# Patient Record
Sex: Female | Born: 1935 | Race: White | Hispanic: No | Marital: Married | Smoking: Former smoker
Health system: Southern US, Community
[De-identification: ages and names within clinical notes are randomized; demographics above are authoritative.]

## PROBLEM LIST (undated history)

## (undated) DIAGNOSIS — K219 Gastro-esophageal reflux disease without esophagitis: Secondary | ICD-10-CM

## (undated) DIAGNOSIS — E785 Hyperlipidemia, unspecified: Secondary | ICD-10-CM

## (undated) DIAGNOSIS — M1990 Unspecified osteoarthritis, unspecified site: Secondary | ICD-10-CM

## (undated) DIAGNOSIS — I209 Angina pectoris, unspecified: Secondary | ICD-10-CM

## (undated) DIAGNOSIS — T63441A Toxic effect of venom of bees, accidental (unintentional), initial encounter: Secondary | ICD-10-CM

## (undated) DIAGNOSIS — T783XXA Angioneurotic edema, initial encounter: Secondary | ICD-10-CM

## (undated) DIAGNOSIS — R0789 Other chest pain: Secondary | ICD-10-CM

## (undated) DIAGNOSIS — G43909 Migraine, unspecified, not intractable, without status migrainosus: Secondary | ICD-10-CM

## (undated) HISTORY — DX: Angioneurotic edema, initial encounter: T78.3XXA

## (undated) HISTORY — DX: Other chest pain: R07.89

## (undated) HISTORY — DX: Toxic effect of venom of bees, accidental (unintentional), initial encounter: T63.441A

## (undated) HISTORY — PX: HEMORRHOID SURGERY: SHX153

## (undated) HISTORY — PX: ADENOIDECTOMY: SUR15

## (undated) HISTORY — PX: TONSILLECTOMY: SUR1361

## (undated) HISTORY — PX: OTHER SURGICAL HISTORY: SHX169

## (undated) HISTORY — PX: BACK SURGERY: SHX140

---

## 1998-01-31 ENCOUNTER — Ambulatory Visit (HOSPITAL_COMMUNITY): Admission: RE | Admit: 1998-01-31 | Discharge: 1998-01-31 | Payer: Self-pay | Admitting: Family Medicine

## 1998-02-09 ENCOUNTER — Encounter: Admission: RE | Admit: 1998-02-09 | Discharge: 1998-05-10 | Payer: Self-pay | Admitting: Anesthesiology

## 1998-03-02 ENCOUNTER — Observation Stay (HOSPITAL_COMMUNITY): Admission: RE | Admit: 1998-03-02 | Discharge: 1998-03-03 | Payer: Self-pay | Admitting: Neurosurgery

## 1999-05-15 ENCOUNTER — Encounter: Admission: RE | Admit: 1999-05-15 | Discharge: 1999-05-15 | Payer: Self-pay | Admitting: Family Medicine

## 1999-07-10 ENCOUNTER — Encounter: Payer: Self-pay | Admitting: Otolaryngology

## 1999-07-10 ENCOUNTER — Encounter: Admission: RE | Admit: 1999-07-10 | Discharge: 1999-07-10 | Payer: Self-pay | Admitting: Otolaryngology

## 2000-06-05 ENCOUNTER — Encounter: Payer: Self-pay | Admitting: Family Medicine

## 2000-06-05 ENCOUNTER — Encounter: Admission: RE | Admit: 2000-06-05 | Discharge: 2000-06-05 | Payer: Self-pay | Admitting: Family Medicine

## 2000-09-23 ENCOUNTER — Emergency Department (HOSPITAL_COMMUNITY): Admission: EM | Admit: 2000-09-23 | Discharge: 2000-09-23 | Payer: Self-pay | Admitting: Emergency Medicine

## 2000-09-23 ENCOUNTER — Encounter: Payer: Self-pay | Admitting: Emergency Medicine

## 2001-03-11 ENCOUNTER — Ambulatory Visit (HOSPITAL_COMMUNITY): Admission: RE | Admit: 2001-03-11 | Discharge: 2001-03-11 | Payer: Self-pay | Admitting: Gastroenterology

## 2001-06-19 ENCOUNTER — Encounter: Payer: Self-pay | Admitting: Family Medicine

## 2001-06-19 ENCOUNTER — Encounter: Admission: RE | Admit: 2001-06-19 | Discharge: 2001-06-19 | Payer: Self-pay | Admitting: Family Medicine

## 2001-07-16 ENCOUNTER — Encounter: Admission: RE | Admit: 2001-07-16 | Discharge: 2001-07-16 | Payer: Self-pay | Admitting: Family Medicine

## 2001-07-16 ENCOUNTER — Encounter: Payer: Self-pay | Admitting: Family Medicine

## 2002-12-06 ENCOUNTER — Encounter: Admission: RE | Admit: 2002-12-06 | Discharge: 2002-12-06 | Payer: Self-pay | Admitting: Internal Medicine

## 2002-12-06 ENCOUNTER — Encounter: Payer: Self-pay | Admitting: Internal Medicine

## 2003-07-25 ENCOUNTER — Other Ambulatory Visit: Admission: RE | Admit: 2003-07-25 | Discharge: 2003-07-25 | Payer: Self-pay | Admitting: Obstetrics and Gynecology

## 2004-07-31 ENCOUNTER — Other Ambulatory Visit: Admission: RE | Admit: 2004-07-31 | Discharge: 2004-07-31 | Payer: Self-pay | Admitting: Obstetrics and Gynecology

## 2005-05-03 ENCOUNTER — Ambulatory Visit (HOSPITAL_COMMUNITY): Admission: RE | Admit: 2005-05-03 | Discharge: 2005-05-03 | Payer: Self-pay | Admitting: Neurosurgery

## 2005-08-07 ENCOUNTER — Other Ambulatory Visit: Admission: RE | Admit: 2005-08-07 | Discharge: 2005-08-07 | Payer: Self-pay | Admitting: Obstetrics and Gynecology

## 2006-09-11 ENCOUNTER — Other Ambulatory Visit: Admission: RE | Admit: 2006-09-11 | Discharge: 2006-09-11 | Payer: Self-pay | Admitting: Obstetrics and Gynecology

## 2006-09-23 ENCOUNTER — Encounter: Admission: RE | Admit: 2006-09-23 | Discharge: 2006-09-23 | Payer: Self-pay | Admitting: Neurosurgery

## 2007-09-16 ENCOUNTER — Other Ambulatory Visit: Admission: RE | Admit: 2007-09-16 | Discharge: 2007-09-16 | Payer: Self-pay | Admitting: Obstetrics and Gynecology

## 2008-09-19 ENCOUNTER — Other Ambulatory Visit: Admission: RE | Admit: 2008-09-19 | Discharge: 2008-09-19 | Payer: Self-pay | Admitting: Obstetrics and Gynecology

## 2009-01-14 ENCOUNTER — Encounter: Admission: RE | Admit: 2009-01-14 | Discharge: 2009-01-14 | Payer: Self-pay | Admitting: Neurosurgery

## 2009-06-08 ENCOUNTER — Ambulatory Visit: Payer: Self-pay | Admitting: Cardiovascular Disease

## 2009-06-08 ENCOUNTER — Encounter (INDEPENDENT_AMBULATORY_CARE_PROVIDER_SITE_OTHER): Payer: Self-pay | Admitting: Internal Medicine

## 2009-06-08 ENCOUNTER — Observation Stay (HOSPITAL_COMMUNITY): Admission: EM | Admit: 2009-06-08 | Discharge: 2009-06-09 | Payer: Self-pay | Admitting: Emergency Medicine

## 2009-06-14 ENCOUNTER — Telehealth (INDEPENDENT_AMBULATORY_CARE_PROVIDER_SITE_OTHER): Payer: Self-pay | Admitting: *Deleted

## 2009-06-15 ENCOUNTER — Encounter (HOSPITAL_COMMUNITY): Admission: RE | Admit: 2009-06-15 | Discharge: 2009-08-01 | Payer: Self-pay | Admitting: Cardiovascular Disease

## 2009-06-15 ENCOUNTER — Ambulatory Visit: Payer: Self-pay | Admitting: Cardiovascular Disease

## 2009-06-15 ENCOUNTER — Ambulatory Visit: Payer: Self-pay

## 2009-06-30 DIAGNOSIS — E782 Mixed hyperlipidemia: Secondary | ICD-10-CM | POA: Insufficient documentation

## 2009-06-30 DIAGNOSIS — K219 Gastro-esophageal reflux disease without esophagitis: Secondary | ICD-10-CM | POA: Insufficient documentation

## 2009-06-30 DIAGNOSIS — R252 Cramp and spasm: Secondary | ICD-10-CM | POA: Insufficient documentation

## 2009-07-06 ENCOUNTER — Encounter: Payer: Self-pay | Admitting: Cardiovascular Disease

## 2009-07-06 ENCOUNTER — Ambulatory Visit (HOSPITAL_COMMUNITY): Admission: RE | Admit: 2009-07-06 | Discharge: 2009-07-06 | Payer: Self-pay | Admitting: Cardiovascular Disease

## 2009-07-06 ENCOUNTER — Ambulatory Visit: Payer: Self-pay | Admitting: Cardiovascular Disease

## 2009-07-06 ENCOUNTER — Ambulatory Visit: Payer: Self-pay

## 2009-07-06 ENCOUNTER — Ambulatory Visit: Payer: Self-pay | Admitting: Cardiology

## 2009-07-06 DIAGNOSIS — R0789 Other chest pain: Secondary | ICD-10-CM | POA: Insufficient documentation

## 2009-07-06 DIAGNOSIS — I7121 Aneurysm of the ascending aorta, without rupture: Secondary | ICD-10-CM | POA: Insufficient documentation

## 2009-07-06 DIAGNOSIS — I712 Thoracic aortic aneurysm, without rupture: Secondary | ICD-10-CM | POA: Insufficient documentation

## 2009-07-06 HISTORY — DX: Other chest pain: R07.89

## 2009-07-11 ENCOUNTER — Ambulatory Visit: Payer: Self-pay | Admitting: Cardiovascular Disease

## 2009-10-19 ENCOUNTER — Other Ambulatory Visit: Admission: RE | Admit: 2009-10-19 | Discharge: 2009-10-19 | Payer: Self-pay | Admitting: Obstetrics and Gynecology

## 2010-02-09 ENCOUNTER — Encounter: Admission: RE | Admit: 2010-02-09 | Discharge: 2010-02-09 | Payer: Self-pay | Admitting: Neurosurgery

## 2010-09-04 NOTE — Assessment & Plan Note (Signed)
Summary: eph/jml   Visit Type:  EPH Primary Provider:  Loraine Leriche Perrini  CC:  chest pain.  History of Present Illness: 75 yo WF with h/o GERD, hyperlipidemia with recent admission to [facility] with chest pain. Her pain was atypical. She ruled out for an MI with serial cardiac enzymes. Stress test and echo were arranged as an outpatient. She is here today for follow up. She has had continued pain that migrates to different parts of her left chest wall. There is no associated SOB or palpitations. She believes that this is related to her GERD. Her stress test and echo are outlined below.   Current Medications (verified): 1)  Nitrostat 0.4 Mg Subl (Nitroglycerin) .Marland Kitchen.. 1 Tablet Under Tongue At Onset of Chest Pain; You May Repeat Every 5 Minutes For Up To 3 Doses. 2)  Protonix 40 Mg Tbec (Pantoprazole Sodium) .Marland Kitchen.. 1 Tab Once Daily 3)  Aspirin 81 Mg Tbec (Aspirin) .... Take One Tablet By Mouth Daily 4)  Estrace 0.1 Mg/gm Crea (Estradiol) .... Use As Directed Every Week 5)  Furosemide 20 Mg Tabs (Furosemide) .Marland Kitchen.. 1 Tab Once Daily 6)  Lipitor 20 Mg Tabs (Atorvastatin Calcium) .Marland Kitchen.. 1 Tab Once Daily 7)  Potassium Chloride Cr 10 Meq Cr-Tabs (Potassium Chloride) .Marland Kitchen.. 1 Tab Once Daily 8)  Vitamin D 400 Unit Caps (Cholecalciferol) .Marland Kitchen.. 1 Cap Once Daily 9)  Calcium 600 Mg Tabs (Calcium) .Marland Kitchen.. 1 Tab Once Daily 10)  Vitamin B Complex  Caps (B Complex Vitamins) .Marland Kitchen.. 1 Cap Once Daily  Allergies: 1)  ! * Diflucan  Past History:  Past Medical History: Reviewed history from 06/30/2009 and no changes required. HYPERLIPIDEMIA (ICD-272.4) LEG CRAMPS (ICD-729.82) GERD (ICD-530.81)    Past Surgical History: Reviewed history from 06/30/2009 and no changes required. Status post back surgeries.  Hemorrhoid surgery. Tonsillectomy  Family History: Reviewed history from 06/30/2009 and no changes required.  Mother deceased, failure to thrive after hip surgery    with some complaints of angina towards the end of her life.  Her father   deceased from prostate cancer with a history of hypertension.  She has a   sister with hypertension.   Social History: Reviewed history from 06/30/2009 and no changes required.  She lives in [place] with her husband.  She is a   Warden/ranger for [employer]  She is married.  She   quit smoking 40 years ago.  Occasional wine.  No use of drugs.      Review of Systems       The patient complains of chest pain.  The patient denies fatigue, malaise, fever, weight gain/loss, vision loss, decreased hearing, hoarseness, palpitations, shortness of breath, prolonged cough, wheezing, sleep apnea, coughing up blood, abdominal pain, blood in stool, nausea, vomiting, diarrhea, heartburn, incontinence, blood in urine, muscle weakness, joint pain, leg swelling, rash, skin lesions, headache, fainting, dizziness, depression, anxiety, enlarged lymph nodes, easy bruising or bleeding, and environmental allergies.    Vital Signs:  Patient profile:   75 year old female Height:      65 inches Weight:      169 pounds BMI:     28.22 Pulse rate:   58 / minute Pulse rhythm:   regular BP sitting:   108 / 70  (left arm) Cuff size:   large  Vitals Entered By: Danielle Rankin, CMA (July 06, 2009 2:46 PM)  Physical Exam  General:  General: Well developed, well nourished, NAD HEENT: OP clear, mucus membranes moist SKIN: warm,  dry Neuro: No focal deficits Musculoskeletal: Muscle strength 5/5 all ext Psychiatric: Mood and affect normal Neck: No JVD, no carotid bruits, no thyromegaly, no lymphadenopathy. Lungs:Clear bilaterally, no wheezes, rhonci, crackles CV: RRR no murmurs, gallops rubs Abdomen: soft, NT, ND, BS present Extremities: No edema, pulses 2+.    EKG  Procedure date:  07/06/2009  Findings:      Sinus bradycardia, rate 58 bpm. Normal o/w.  Echocardiogram  Procedure date:  07/06/2009   Findings:      Left ventricle: The cavity size was normal. Wall thickness was       normal. Systolic function was normal. The estimated ejection       fraction was in the range of 60% to 65%. Wall motion was normal;       there were no regional wall motion abnormalities. Features are       consistent with a pseudonormal left ventricular filling pattern,       with concomitant abnormal relaxation and increased filling       pressure (grade 2 diastolic dysfunction).     - Aortic valve: Transvalvular velocity was within the normal range.       There was no stenosis.     - Aorta: Ascending aortic diameter: 43mm (S).     - Mitral valve: No regurgitation.     - Right ventricle: The cavity size was normal. Systolic function was       normal.     - Pulmonary arteries: PA systolic pressure 31-35 mmHg.     - Systemic veins: IVC measures 2.3 cm with normal respirophasic       variation suggesting RA pressure 6-10 mmHg.     Impressions:            - Normal LV size and systolic function, EF 60-65% with no wall       motion abnormalities. There was moderate diastolic dysfunction. No       significant valvular dysfunction. Normal RV size and systolic       function. No pulmonary edema. Ascending aortic aneurysm measuring       4.3 cm. Would consider further imaging (CTA or MRA) to visualized       the remainder of the thoracic aorta.  Nuclear ETT  Procedure date:  06/15/2009  Findings:      Exercise Capacity: Poor exercise capacity. BP Response: Normal blood pressure response. Clinical Symptoms: Chest Pain ECG Impression: No significant ST segment change suggestive of ischemia. Overall Impression: Normal stress nuclear study. Overall Impression Comments: Normal   Impression & Recommendations:  Problem # 1:  CHEST PAIN UNSPECIFIED (ICD-786.50)  No evidence of ischemia on stress test. She does have mild dilation of the aortic root. The echo results were not available during her visit today. Will plan on CT angiogram to define ascending aorta size.  No invasive workup for chest pain at this time. If her symptoms do not improve after further GI workup, will consider cardiac cath to exclude CAD.  Her updated medication list for this problem includes:    Nitrostat 0.4 Mg Subl (Nitroglycerin) .Marland Kitchen... 1 tablet under tongue at onset of chest pain; you may repeat every 5 minutes for up to 3 doses.    Aspirin 81 Mg Tbec (Aspirin) .Marland Kitchen... Take one tablet by mouth daily  Orders: EKG w/ Interpretation (93000)  Problem # 2:  THORACIC AORTIC ANEURYSM (ICD-441.2) See above. CTA chest to size aorta and exclude dissection.   Patient Instructions: 1)  Your physician  recommends that you schedule a follow-up appointment in: 6 months 2)  Your physician recommends that you continue on your current medications as directed. Please refer to the Current Medication list given to you today.

## 2010-09-04 NOTE — Progress Notes (Signed)
Summary: Nuclear Pre-procedure  Phone Note Outgoing Call Call back at [facility] Phone [phone]   Call placed by: Stanton Kidney, EMT-P,  June 14, 2009 2:32 PM Action Taken: Phone Call Completed Summary of Call: Reviewed information on Myoview Information Sheet (see scanned document for further details).  Spoke with Pt's husband.    Nuclear Med Background Indications for Stress Test: Evaluation for Ischemia, Post Hospital  Indications Comments: 06/09/09 CP: MI r/o    Symptoms: Chest Pain, Chest Tightness, Nausea    Nuclear Pre-Procedure Cardiac Risk Factors: History of Smoking, Lipids

## 2010-09-04 NOTE — Assessment & Plan Note (Signed)
Summary: Cardiology Nuclear Study  Nuclear Med Background Indications for Stress Test: Evaluation for Ischemia, Post Hospital  Indications Comments: 06/09/09 CP: MI r/o    Symptoms: Chest Pain, Chest Tightness, Nausea    Nuclear Pre-Procedure Cardiac Risk Factors: History of Smoking, Lipids Caffeine/Decaff Intake: none NPO After: 7:30 AM Lungs: clear IV 0.9% NS with Angio Cath: 18g     IV Site: (R) AC IV Started by: Stanton Kidney EMT-P Chest Size (in) 36     Cup Size C     Height (in): 65 Weight (lb): 166 BMI: 27.72  Nuclear Med Study 1 or 2 day study:  1 day     Stress Test Type:  Stress Reading MD:  Charlton Haws, MD     Referring MD:  C.McAlhany Resting Radionuclide:  Technetium 28m Tetrofosmin     Resting Radionuclide Dose:  10.3 mCi  Stress Radionuclide:  Technetium 27m Tetrofosmin     Stress Radionuclide Dose:  32. mCi   Stress Protocol Exercise Time (min):  4:01 min     Max HR:  144 bpm     Predicted Max HR:  149 bpm  Max Systolic BP: 160 mm Hg     Percent Max HR:  96.64 %     METS: 5.4 Rate Pressure Product:  78295    Stress Test Technologist:  Milana Na EMT-P     Nuclear Technologist:  Harlow Asa CNMT  Rest Procedure  Myocardial perfusion imaging was performed at rest 45 minutes following the intravenous administration of Myoview Technetium 87m Tetrofosmin.  Stress Procedure  The patient exercised for 4:01. The patient stopped due to fatigue and chest pain/squeezing.  There were no significant ST-T wave changes.  Myoview was injected at peak exercise and myocardial perfusion imaging was performed after a brief delay.  QPS Raw Data Images:  Normal; no motion artifact; normal heart/lung ratio. Stress Images:  NI: Uniform and normal uptake of tracer in all myocardial segments. Rest Images:  Normal homogeneous uptake in all areas of the myocardium. Subtraction (SDS):  Normal Transient Ischemic Dilatation:  1.07  (Normal <1.22)   Lung/Heart Ratio:  .38  (Normal <0.45)  Quantitative Gated Spect Images QGS EDV:  64 ml QGS ESV:  15 ml QGS EF:  77 % QGS cine images:  Normal  Findings Normal nuclear study      Overall Impression  Exercise Capacity: Poor exercise capacity. BP Response: Normal blood pressure response. Clinical Symptoms: Chest Pain ECG Impression: No significant ST segment change suggestive of ischemia. Overall Impression: Normal stress nuclear study. Overall Impression Comments: Normal     Appended Document: Cardiology Nuclear Study No ischemia. Can we call pt. cdm  Appended Document: Cardiology Nuclear Study pt. notified

## 2010-11-07 LAB — TROPONIN I: Troponin I: <0.01 ng/mL (ref 0.00–0.06)

## 2010-11-07 LAB — COMPREHENSIVE METABOLIC PANEL
ALT: 31 U/L (ref 0–35)
AST: 31 U/L (ref 0–37)
Albumin: 3.8 g/dL (ref 3.5–5.2)
Alkaline Phosphatase: 72 U/L (ref 39–117)
BUN: 15 mg/dL (ref 6–23)
CO2: 29 mEq/L (ref 19–32)
Calcium: 9.1 mg/dL (ref 8.4–10.5)
Chloride: 106 mEq/L (ref 96–112)
Creatinine, Ser: 0.89 mg/dL (ref 0.4–1.2)
GFR calc Af Amer: >60 mL/min (ref >60)
GFR calc non Af Amer: >60 mL/min (ref >60)
Glucose, Bld: 83 mg/dL (ref 70–99)
Potassium: 3.9 mEq/L (ref 3.5–5.1)
Sodium: 142 mEq/L (ref 135–145)
Total Bilirubin: 0.5 mg/dL (ref 0.3–1.2)
Total Protein: 6.8 g/dL (ref 6.0–8.3)

## 2010-11-07 LAB — DIFFERENTIAL
Basophils Absolute: 0 10*3/uL (ref 0.0–0.1)
Basophils Relative: 0 % (ref 0–1)
Eosinophils Absolute: 0.1 10*3/uL (ref 0.0–0.7)
Eosinophils Relative: 3 % (ref 0–5)
Lymphocytes Relative: 32 % (ref 12–46)
Lymphs Abs: 1 10*3/uL (ref 0.7–4.0)
Monocytes Absolute: 0.2 10*3/uL (ref 0.1–1.0)
Monocytes Relative: 7 % (ref 3–12)
Neutro Abs: 1.8 10*3/uL (ref 1.7–7.7)
Neutrophils Relative %: 58 % (ref 43–77)

## 2010-11-07 LAB — CARDIAC PANEL(CRET KIN+CKTOT+MB+TROPI)
CK, MB: 1 ng/mL (ref 0.3–4.0)
CK, MB: 1.1 ng/mL (ref 0.3–4.0)
Relative Index: 1 (ref 0.0–2.5)
Relative Index: INVALID (ref 0.0–2.5)
Total CK: 111 U/L (ref 7–177)
Total CK: 97 U/L (ref 7–177)
Troponin I: 0.01 ng/mL (ref 0.00–0.06)
Troponin I: <0.01 ng/mL (ref 0.00–0.06)

## 2010-11-07 LAB — CK TOTAL AND CKMB (NOT AT ARMC)
CK, MB: 1.2 ng/mL (ref 0.3–4.0)
Relative Index: 1 (ref 0.0–2.5)
Total CK: 119 U/L (ref 7–177)

## 2010-11-07 LAB — POCT CARDIAC MARKERS
CKMB, poc: 1.4 ng/mL (ref 1.0–8.0)
Myoglobin, poc: 77.5 ng/mL (ref 12–200)
Troponin i, poc: <0.05 ng/mL (ref 0.00–0.09)

## 2010-11-07 LAB — BASIC METABOLIC PANEL
BUN: 17 mg/dL (ref 6–23)
CO2: 29 mEq/L (ref 19–32)
Calcium: 9 mg/dL (ref 8.4–10.5)
Chloride: 105 mEq/L (ref 96–112)
Creatinine, Ser: 0.85 mg/dL (ref 0.4–1.2)
GFR calc Af Amer: >60 mL/min (ref >60)
GFR calc non Af Amer: >60 mL/min (ref >60)
Glucose, Bld: 94 mg/dL (ref 70–99)
Potassium: 4.1 mEq/L (ref 3.5–5.1)
Sodium: 139 mEq/L (ref 135–145)

## 2010-11-07 LAB — CBC
HCT: 36.7 % (ref 36.0–46.0)
Hemoglobin: 12.8 g/dL (ref 12.0–15.0)
MCHC: 34.8 g/dL (ref 30.0–36.0)
MCV: 93.7 fL (ref 78.0–100.0)
Platelets: 142 10*3/uL — ABNORMAL LOW (ref 150–400)
RBC: 3.91 MIL/uL (ref 3.87–5.11)
RDW: 13.1 % (ref 11.5–15.5)
WBC: 3.1 10*3/uL — ABNORMAL LOW (ref 4.0–10.5)

## 2010-11-07 LAB — D-DIMER, QUANTITATIVE: D-Dimer, Quant: 0.43 ug/mL-FEU (ref 0.00–0.48)

## 2010-11-07 LAB — MAGNESIUM: Magnesium: 2.4 mg/dL (ref 1.5–2.5)

## 2010-11-07 LAB — TSH: TSH: 1.546 u[IU]/mL (ref 0.350–4.500)

## 2010-11-07 LAB — LIPID PANEL
Cholesterol: 129 mg/dL (ref 0–200)
HDL: 49 mg/dL (ref >39)
LDL Cholesterol: 74 mg/dL (ref 0–99)
Total CHOL/HDL Ratio: 2.6 RATIO
Triglycerides: 30 mg/dL (ref <150)
VLDL: 6 mg/dL (ref 0–40)

## 2012-03-13 ENCOUNTER — Other Ambulatory Visit (HOSPITAL_COMMUNITY): Payer: Self-pay | Admitting: Internal Medicine

## 2012-03-13 ENCOUNTER — Ambulatory Visit (HOSPITAL_COMMUNITY)
Admission: RE | Admit: 2012-03-13 | Discharge: 2012-03-13 | Disposition: A | Discharge status: Home / Self Care | Payer: Medicare Other | Source: Ambulatory Visit | Attending: Internal Medicine | Admitting: Internal Medicine

## 2012-03-13 ENCOUNTER — Other Ambulatory Visit: Payer: Self-pay

## 2012-03-13 DIAGNOSIS — K7689 Other specified diseases of liver: Secondary | ICD-10-CM | POA: Insufficient documentation

## 2012-03-13 DIAGNOSIS — R0602 Shortness of breath: Secondary | ICD-10-CM

## 2012-03-13 DIAGNOSIS — R079 Chest pain, unspecified: Secondary | ICD-10-CM | POA: Insufficient documentation

## 2012-03-13 DIAGNOSIS — I77819 Aortic ectasia, unspecified site: Secondary | ICD-10-CM | POA: Insufficient documentation

## 2012-03-13 LAB — BASIC METABOLIC PANEL
BUN: 16 mg/dL (ref 6–23)
CO2: 30 mEq/L (ref 19–32)
Calcium: 8.9 mg/dL (ref 8.4–10.5)
Chloride: 105 mEq/L (ref 96–112)
Creatinine, Ser: 0.84 mg/dL (ref 0.50–1.10)
GFR calc Af Amer: 78 mL/min — ABNORMAL LOW (ref >90)
GFR calc non Af Amer: 67 mL/min — ABNORMAL LOW (ref >90)
Glucose, Bld: 104 mg/dL — ABNORMAL HIGH (ref 70–99)
Potassium: 4.2 mEq/L (ref 3.5–5.1)
Sodium: 141 mEq/L (ref 135–145)

## 2012-03-13 MED ORDER — IOHEXOL 350 MG/ML SOLN
100.0000 mL | Freq: Once | INTRAVENOUS | Status: AC | PRN
  Administered 2012-03-13: 100 mL via INTRAVENOUS

## 2012-09-03 ENCOUNTER — Observation Stay (HOSPITAL_COMMUNITY)
Admission: EM | Admit: 2012-09-03 | Discharge: 2012-09-04 | DRG: 125 | Disposition: A | Discharge status: Home / Self Care | Payer: Medicare Other | Attending: Internal Medicine | Admitting: Internal Medicine

## 2012-09-03 ENCOUNTER — Emergency Department (HOSPITAL_COMMUNITY): Payer: Medicare Other

## 2012-09-03 ENCOUNTER — Encounter (HOSPITAL_COMMUNITY): Payer: Self-pay | Admitting: *Deleted

## 2012-09-03 DIAGNOSIS — I5032 Chronic diastolic (congestive) heart failure: Secondary | ICD-10-CM | POA: Diagnosis present

## 2012-09-03 DIAGNOSIS — E785 Hyperlipidemia, unspecified: Secondary | ICD-10-CM | POA: Diagnosis present

## 2012-09-03 DIAGNOSIS — R209 Unspecified disturbances of skin sensation: Secondary | ICD-10-CM | POA: Diagnosis present

## 2012-09-03 DIAGNOSIS — H539 Unspecified visual disturbance: Principal | ICD-10-CM | POA: Diagnosis present

## 2012-09-03 DIAGNOSIS — G459 Transient cerebral ischemic attack, unspecified: Secondary | ICD-10-CM

## 2012-09-03 DIAGNOSIS — Z79899 Other long term (current) drug therapy: Secondary | ICD-10-CM

## 2012-09-03 DIAGNOSIS — I509 Heart failure, unspecified: Secondary | ICD-10-CM | POA: Diagnosis present

## 2012-09-03 DIAGNOSIS — H5310 Unspecified subjective visual disturbances: Secondary | ICD-10-CM | POA: Diagnosis present

## 2012-09-03 DIAGNOSIS — R200 Anesthesia of skin: Secondary | ICD-10-CM | POA: Diagnosis present

## 2012-09-03 DIAGNOSIS — N390 Urinary tract infection, site not specified: Secondary | ICD-10-CM | POA: Diagnosis present

## 2012-09-03 DIAGNOSIS — E782 Mixed hyperlipidemia: Secondary | ICD-10-CM | POA: Diagnosis present

## 2012-09-03 DIAGNOSIS — Z87891 Personal history of nicotine dependence: Secondary | ICD-10-CM

## 2012-09-03 HISTORY — DX: Angina pectoris, unspecified: I20.9

## 2012-09-03 HISTORY — DX: Hyperlipidemia, unspecified: E78.5

## 2012-09-03 LAB — PROTIME-INR
INR: 0.96 (ref 0.00–1.49)
Prothrombin Time: 12.7 seconds (ref 11.6–15.2)

## 2012-09-03 LAB — CBC WITH DIFFERENTIAL/PLATELET
Basophils Absolute: 0 10*3/uL (ref 0.0–0.1)
Basophils Relative: 1 % (ref 0–1)
Eosinophils Absolute: 0.1 10*3/uL (ref 0.0–0.7)
Eosinophils Relative: 2 % (ref 0–5)
HCT: 38.8 % (ref 36.0–46.0)
Hemoglobin: 13.1 g/dL (ref 12.0–15.0)
Lymphocytes Relative: 34 % (ref 12–46)
Lymphs Abs: 1.7 10*3/uL (ref 0.7–4.0)
MCH: 31 pg (ref 26.0–34.0)
MCHC: 33.8 g/dL (ref 30.0–36.0)
MCV: 91.9 fL (ref 78.0–100.0)
Monocytes Absolute: 0.3 10*3/uL (ref 0.1–1.0)
Monocytes Relative: 5 % (ref 3–12)
Neutro Abs: 2.9 10*3/uL (ref 1.7–7.7)
Neutrophils Relative %: 58 % (ref 43–77)
Platelets: 161 10*3/uL (ref 150–400)
RBC: 4.22 MIL/uL (ref 3.87–5.11)
RDW: 12.8 % (ref 11.5–15.5)
WBC: 5.1 10*3/uL (ref 4.0–10.5)

## 2012-09-03 LAB — COMPREHENSIVE METABOLIC PANEL
ALT: 13 U/L (ref 0–35)
AST: 19 U/L (ref 0–37)
Albumin: 3.8 g/dL (ref 3.5–5.2)
Alkaline Phosphatase: 88 U/L (ref 39–117)
BUN: 19 mg/dL (ref 6–23)
CO2: 29 mEq/L (ref 19–32)
Calcium: 9.4 mg/dL (ref 8.4–10.5)
Chloride: 108 mEq/L (ref 96–112)
Creatinine, Ser: 0.86 mg/dL (ref 0.50–1.10)
GFR calc Af Amer: 75 mL/min — ABNORMAL LOW (ref >90)
GFR calc non Af Amer: 65 mL/min — ABNORMAL LOW (ref >90)
Glucose, Bld: 108 mg/dL — ABNORMAL HIGH (ref 70–99)
Potassium: 4 mEq/L (ref 3.5–5.1)
Sodium: 146 mEq/L — ABNORMAL HIGH (ref 135–145)
Total Bilirubin: 0.2 mg/dL — ABNORMAL LOW (ref 0.3–1.2)
Total Protein: 6.9 g/dL (ref 6.0–8.3)

## 2012-09-03 LAB — TROPONIN I: Troponin I: <0.3 ng/mL (ref <0.30)

## 2012-09-03 MED ORDER — POTASSIUM CHLORIDE CRYS ER 10 MEQ PO TBCR
10.0000 meq | EXTENDED_RELEASE_TABLET | Freq: Every day | ORAL | Status: DC
Start: 2012-09-04 — End: 2012-09-04
  Administered 2012-09-04: 10 meq via ORAL
  Filled 2012-09-03 (×2): qty 1

## 2012-09-03 MED ORDER — ATORVASTATIN CALCIUM 20 MG PO TABS
20.0000 mg | ORAL_TABLET | Freq: Every day | ORAL | Status: DC
  Administered 2012-09-04: 20 mg via ORAL
  Filled 2012-09-03 (×2): qty 1

## 2012-09-03 MED ORDER — FUROSEMIDE 20 MG PO TABS
20.0000 mg | ORAL_TABLET | Freq: Every day | ORAL | Status: DC
  Administered 2012-09-04: 20 mg via ORAL
  Filled 2012-09-03 (×2): qty 1

## 2012-09-03 MED ORDER — ASPIRIN 325 MG PO TABS
325.0000 mg | ORAL_TABLET | Freq: Every day | ORAL | Status: DC
  Administered 2012-09-04: 325 mg via ORAL
  Filled 2012-09-03 (×2): qty 1

## 2012-09-03 MED ORDER — ASPIRIN 300 MG RE SUPP
300.0000 mg | Freq: Every day | RECTAL | Status: DC
  Filled 2012-09-03 (×2): qty 1

## 2012-09-03 MED ORDER — ASPIRIN 81 MG PO CHEW
324.0000 mg | CHEWABLE_TABLET | Freq: Once | ORAL | Status: AC
  Administered 2012-09-03: 324 mg via ORAL
  Filled 2012-09-03: qty 4

## 2012-09-03 NOTE — ED Notes (Signed)
Attempted to call report

## 2012-09-03 NOTE — ED Notes (Signed)
Unable to complete EKG due to pt being in CT

## 2012-09-03 NOTE — ED Notes (Addendum)
~   1hr. Ago. Started having cracked vision in lt. Eye. Also happened in 9/13 but did not make visit. The symptoms resolved. Feels like something in eye. Dull h/a on lt. Side. Tingling around lip. Had indigestion earlier. Neg. Stroke scale. Visual change is improving. Husband states, "noticed upper eye lid drooping when pt. Awoke."

## 2012-09-03 NOTE — H&P (Signed)
PCP:   Ezequiel Kayser, MD   Chief Complaint:  Numb of mouth  HPI: 77 yo female overall healthy had an episode of perioral numbness and left vision blurriness that occurred earlier this evening for aoubt 20 minutes tehn resolved spontaneoudly.  No prior h/o cva or cad.  No fevers/cough/n/v/d.  No headache.  No focal neurological def since in ED.  Takes baby asa a day.  Review of Systems:  Positive and negative as per HPI otherwise all other systems are negative  Past Medical History: Past Medical History  Diagnosis Date  . Anginal pain   . Hyperlipemia    Past Surgical History  Procedure Date  . Tonsillectomy   . Hemorrhoid surgery   . Back surgery     Medications: Prior to Admission medications   Medication Sig Start Date End Date Taking? Authorizing Provider  atorvastatin (LIPITOR) 20 MG tablet Take 20 mg by mouth daily.   Yes Historical Provider, MD  Calcium Carbonate-Vitamin D (CALCIUM 600 + D PO) Take 600 mg by mouth daily.   Yes Historical Provider, MD  cholecalciferol (VITAMIN D) 1000 UNITS tablet Take 1,000 Units by mouth daily.   Yes Historical Provider, MD  Coenzyme Q10 (COQ10) 100 MG CAPS Take 100 mg by mouth daily.   Yes Historical Provider, MD  docusate sodium (COLACE) 100 MG capsule Take 100 mg by mouth daily.   Yes Historical Provider, MD  Estradiol (ESTRACE VA) Place 1 application vaginally daily as needed. For vaginal dryness   Yes Historical Provider, MD  furosemide (LASIX) 20 MG tablet Take 20 mg by mouth daily.   Yes Historical Provider, MD  naproxen sodium (ANAPROX) 220 MG tablet Take 220 mg by mouth 2 (two) times daily as needed.   Yes Historical Provider, MD  potassium chloride (K-DUR,KLOR-CON) 10 MEQ tablet Take 10 mEq by mouth daily.   Yes Historical Provider, MD    Allergies:   Allergies  Allergen Reactions  . Fluconazole Rash    Social History:  reports that she has quit smoking. She does not have any smokeless tobacco history on file. She  reports that she drinks alcohol. She reports that she does not use illicit drugs.  Family History: cardiac  Physical Exam: Filed Vitals:   09/03/12 2054 09/03/12 2146 09/03/12 2159  BP: 125/73    Pulse: 89    Temp: 97.9 F (36.6 C) 98 F (36.7 C) 98 F (36.7 C)  TempSrc: Oral Oral   Resp: 14    SpO2: 97%     General appearance: alert, cooperative and no distress Neck: no JVD and supple, symmetrical, trachea midline Lungs: clear to auscultation bilaterally Heart: regular rate and rhythm, S1, S2 normal, no murmur, click, rub or gallop Abdomen: soft, non-tender; bowel sounds normal; no masses,  no organomegaly Extremities: extremities normal, atraumatic, no cyanosis or edema Pulses: 2+ and symmetric Skin: Skin color, texture, turgor normal. No rashes or lesions Neurologic: Grossly normal    Labs on Admission:   [facility] 09/03/12 2110  NA 146*  K 4.0  CL 108  CO2 29  GLUCOSE 108*  BUN 19  CREATININE 0.86  CALCIUM 9.4  MG --  PHOS --    Basename 09/03/12 2110  AST 19  ALT 13  ALKPHOS 88  BILITOT 0.2*  PROT 6.9  ALBUMIN 3.8     Basename 09/03/12 2110  WBC 5.1  NEUTROABS 2.9  HGB 13.1  HCT 38.8  MCV 91.9  PLT 161    Basename 09/03/12 2111  CKTOTAL --  CKMB --  CKMBINDEX --  TROPONINI <0.30    Radiological Exams on Admission: Ct Head Wo Contrast  09/03/2012  *RADIOLOGY REPORT*  Clinical Data: Visual field changes, numbness  CT HEAD WITHOUT CONTRAST  Technique:  Contiguous axial images were obtained from the base of the skull through the vertex without contrast.  Comparison: Report is available from a remote CT scan of the head dated 09/23/2000  Findings: No acute intracranial hemorrhage, acute infarction, mass lesion, mass effect, hydrocephalus or midline shift.  The gray- white interface is preserved throughout.  All no significant cerebral or cerebellar volume loss.  The globes are intact. Unremarkable visualized appearance of the orbits.  No focal  soft tissue abnormality.  Normal aeration of the mastoid air cells and paranasal sinuses.  There is mild mucoperiosteal thickening versus small polyp in the posterior left ethmoid air cell.  Trace atherosclerotic vascular calcification in the cavernous carotid arteries.  No acute calvarial abnormality.  IMPRESSION: No acute intracranial abnormality.  Essentially negative noncontrast head CT.   Original Report Authenticated By: Malachy Moan, M.D.     Assessment/Plan 77 yo female tia like symtoms resolved  Principal Problem:  *TIA (transient ischemic attack) Active Problems:  HYPERLIPIDEMIA  Numbness around mouth  Vision disturbance  Ck mri, carotids, 2d echo.  Neuro cks.  Asa 325mg  daily.  Tele.  obs.  Kelven Flater A 09/03/2012, 10:49 PM

## 2012-09-03 NOTE — ED Notes (Signed)
MD at bedside. 

## 2012-09-03 NOTE — ED Provider Notes (Signed)
History  This chart was scribed for Glynn Octave, MD by Shari Heritage, ED Scribe. The patient was seen in room B18C/B18C. Patient's care was started at 2106.  CSN: [id number]  Arrival date & time 09/03/12  2043   First MD Initiated Contact with Patient 09/03/12 2106      Chief Complaint  Patient presents with  . Visual Field Change  . Numbness    The history is provided by the patient. No language interpreter was used.    HPI Comments: Linda Lee is a 77 y.o. female who presents to the Emergency Department complaining of visual changes and numbness around left side of mouth onset 1-2 hours ago. There is associated chest tightness. Patient states that she began to have "cracked vision." Patient says that she began to see "cracks" in her visual field and this lasted. She states that cracked vision, numbness and chest tightness. Patient says that she not completley back to normal and reports some visual changes that she describes as "sliver," improved from prior. She denies seeing spots. Patient denies weakness, dizziness or shortness of breath. Patient is ambulatory without difficulty. Patient says that she also had some nausea and reflux pain early so she took a Tums and had significant improvement.   Past Medical History  Diagnosis Date  . Anginal pain   . Hyperlipemia     Past Surgical History  Procedure Date  . Tonsillectomy   . Hemorrhoid surgery   . Back surgery     No family history on file.  History  Substance Use Topics  . Smoking status: Former Games developer  . Smokeless tobacco: Not on file  . Alcohol Use: Yes    OB History    Grav Para Term Preterm Abortions TAB SAB Ect Mult Living                  Review of Systems A complete 10 system review of systems was obtained and all systems are negative except as noted in the HPI and PMH.   Allergies  Fluconazole  Home Medications  No current outpatient prescriptions on file.  Triage Vitals: BP 125/73   Pulse 89  Temp 97.9 F (36.6 C) (Oral)  Resp 14  SpO2 97%  Physical Exam  Constitutional: She is oriented to person, place, and time. She appears well-developed and well-nourished. No distress.  HENT:  Head: Normocephalic and atraumatic.  Eyes: EOM are normal. Pupils are equal, round, and reactive to light. Right eye exhibits no nystagmus. Left eye exhibits no nystagmus.  Neck: Neck supple.  Cardiovascular: Normal rate, regular rhythm and normal heart sounds.   No murmur heard. Pulmonary/Chest: Effort normal and breath sounds normal.  Abdominal: Soft.  Neurological: She is alert and oriented to person, place, and time. She has normal strength. No cranial nerve deficit or sensory deficit. Coordination normal. GCS eye subscore is 4. GCS verbal subscore is 5. GCS motor subscore is 6.       No ataxia on finger to nose bilaterally. 5/5 strength throughout. CN 2-12 intact.  Skin: Skin is warm and dry.  Psychiatric: She has a normal mood and affect. Her behavior is normal.    ED Course  Procedures (including critical care time) DIAGNOSTIC STUDIES: Oxygen Saturation is 97% on room air, adequate by my interpretation.    COORDINATION OF CARE: 9:10 PM- Patient informed of current plan for treatment and evaluation and agrees with plan at this time.      Labs Reviewed  COMPREHENSIVE METABOLIC  PANEL - Abnormal; Notable for the following:    Sodium 146 (*)     Glucose, Bld 108 (*)     Total Bilirubin 0.2 (*)     GFR calc non Af Amer 65 (*)     GFR calc Af Amer 75 (*)     All other components within normal limits  CBC WITH DIFFERENTIAL  TROPONIN I  PROTIME-INR  URINALYSIS, ROUTINE W REFLEX MICROSCOPIC  HEMOGLOBIN A1C  LIPID PANEL   Ct Head Wo Contrast  09/03/2012  *RADIOLOGY REPORT*  Clinical Data: Visual field changes, numbness  CT HEAD WITHOUT CONTRAST  Technique:  Contiguous axial images were obtained from the base of the skull through the vertex without contrast.  Comparison:  Report is available from a remote CT scan of the head dated 09/23/2000  Findings: No acute intracranial hemorrhage, acute infarction, mass lesion, mass effect, hydrocephalus or midline shift.  The gray- white interface is preserved throughout.  All no significant cerebral or cerebellar volume loss.  The globes are intact. Unremarkable visualized appearance of the orbits.  No focal soft tissue abnormality.  Normal aeration of the mastoid air cells and paranasal sinuses.  There is mild mucoperiosteal thickening versus small polyp in the posterior left ethmoid air cell.  Trace atherosclerotic vascular calcification in the cavernous carotid arteries.  No acute calvarial abnormality.  IMPRESSION: No acute intracranial abnormality.  Essentially negative noncontrast head CT.   Original Report Authenticated By: Malachy Moan, M.D.      1. TIA (transient ischemic attack)   2. Numbness around mouth   3. Other and unspecified hyperlipidemia   4. Vision disturbance       MDM    77 y/o F p/w visual changes with numbness. Largely resolved upon arrival. HDS, af. NAD. Non-toxic. Neuro intact at this time. Concern for possible TIA. Patient without any orbital pain, conjunctival injection, FB sensation, eye trauma to suggest orbital pathology. CT head negative. Given ASA Admit to medicine for further w/u and mgmt.  Labs and imaging reviewed by myself and considered in medical decision making if ordered. Imaging interpreted by radiology.   Discussed case with Dr. Manus Gunning who is in agreement with assessment and plan.    I personally performed the services described in this documentation, which was scribed in my presence. The recorded information has been reviewed and is accurate.     Stevie Kern, MD 09/04/12 0000

## 2012-09-03 NOTE — ED Notes (Signed)
Unable to assess pt at this time due to being in CT

## 2012-09-03 NOTE — ED Notes (Signed)
Pt returned from CT °

## 2012-09-04 ENCOUNTER — Encounter (HOSPITAL_COMMUNITY): Payer: Self-pay | Admitting: *Deleted

## 2012-09-04 ENCOUNTER — Inpatient Hospital Stay (HOSPITAL_COMMUNITY): Payer: Medicare Other

## 2012-09-04 DIAGNOSIS — I5032 Chronic diastolic (congestive) heart failure: Secondary | ICD-10-CM

## 2012-09-04 DIAGNOSIS — H5310 Unspecified subjective visual disturbances: Secondary | ICD-10-CM

## 2012-09-04 DIAGNOSIS — N390 Urinary tract infection, site not specified: Secondary | ICD-10-CM | POA: Diagnosis present

## 2012-09-04 DIAGNOSIS — I509 Heart failure, unspecified: Secondary | ICD-10-CM

## 2012-09-04 LAB — LIPID PANEL
Cholesterol: 136 mg/dL (ref 0–200)
HDL: 67 mg/dL (ref >39)
LDL Cholesterol: 63 mg/dL (ref 0–99)
Total CHOL/HDL Ratio: 2 RATIO
Triglycerides: 30 mg/dL (ref <150)
VLDL: 6 mg/dL (ref 0–40)

## 2012-09-04 LAB — URINALYSIS, ROUTINE W REFLEX MICROSCOPIC
Bilirubin Urine: NEGATIVE
Glucose, UA: NEGATIVE mg/dL
Hgb urine dipstick: NEGATIVE
Ketones, ur: NEGATIVE mg/dL
Nitrite: POSITIVE — AB
Protein, ur: NEGATIVE mg/dL
Specific Gravity, Urine: 1.031 — ABNORMAL HIGH (ref 1.005–1.030)
Urobilinogen, UA: 0.2 mg/dL (ref 0.0–1.0)
pH: 5 (ref 5.0–8.0)

## 2012-09-04 LAB — URINE MICROSCOPIC-ADD ON

## 2012-09-04 LAB — PRO B NATRIURETIC PEPTIDE: Pro B Natriuretic peptide (BNP): 367.1 pg/mL — ABNORMAL HIGH (ref 0–125)

## 2012-09-04 LAB — HEMOGLOBIN A1C
Hgb A1c MFr Bld: 5.4 % (ref <5.7)
Mean Plasma Glucose: 108 mg/dL (ref <117)

## 2012-09-04 MED ORDER — ASPIRIN 81 MG PO TBEC: 81.0000 mg | DELAYED_RELEASE_TABLET | Freq: Every day | ORAL | Status: DC

## 2012-09-04 MED ORDER — CIPROFLOXACIN HCL 500 MG PO TABS: 500.0000 mg | ORAL_TABLET | Freq: Two times a day (BID) | ORAL | Status: AC

## 2012-09-04 MED ORDER — LIVING BETTER WITH HEART FAILURE BOOK
Freq: Once | Status: AC
  Administered 2012-09-04: 15:00:00
  Filled 2012-09-04: qty 1

## 2012-09-04 MED ORDER — CIPROFLOXACIN HCL 500 MG PO TABS
500.0000 mg | ORAL_TABLET | Freq: Two times a day (BID) | ORAL | Status: DC
  Administered 2012-09-04: 500 mg via ORAL
  Filled 2012-09-04 (×5): qty 1

## 2012-09-04 NOTE — Progress Notes (Addendum)
RN has reviewed pt's d/c instructions with her, she verbalized understanding, signed paper work, awaiting pharmacy to sent pt the stronger heart pump book and then pt will be ready to be wheeled. Rn has notified and called pharmacy for the book, still waiting for it.----Arnetha Silverthorne, rn

## 2012-09-04 NOTE — Progress Notes (Signed)
Pt has finally has had her stronger heart pump book and has been d/c home in a stable condition.----Rubi Tooley, rn

## 2012-09-04 NOTE — Discharge Summary (Signed)
Physician Discharge Summary  Linda Lee ZOX:[id number] DOB: 1935-09-27 DOA: 09/03/2012  PCP: Ezequiel Kayser, MD  Admit date: 09/03/2012 Discharge date: 09/04/2012  Time spent: 35 minutes  Recommendations for Outpatient Follow-up:  1. Patient will follow up with her PCP as scheduled in one month 2. She'll make an appointment to follow up with her cardiologist in the next one month for routine followup for management and maintenance of her diastolic heart failure 3. Because her visual issues persisting and we felt that this may be more of a primary eye problem, she'll follow up with her ophthalmologist in the next few weeks.  Discharge Diagnoses:  Principal Problem:  *Subjective visual disturbance, left eye: Initially there was concern this was a TIA versus CVA. MRI and MRA were negative. Carotid Dopplers were negative as well in review of patient's risk factors, she has no history of hypertension or diabetes. Blood sugars remained stable and lipid panel was actually noted well-controlled LDL and HDL while on Lipitor. Blood pressure during hospitalization including on admission were normal as well. She started on a baby aspirin and I saw no benefit to add Plavix in a patient with you do not suspect his had a TIA. I suspect he given persistence of her left visual field complaints this may be more of a primary eye problem and patient is advised to follow up with her ophthalmologist the next few weeks. She is having no eye pain or other symptoms. Her neurological exam is completely unremarkable. Active Problems:  HYPERLIPIDEMIA: Patient will continue on her statin  Numbness around mouth: Resolved. Midrin secondary dehydration brought on by urinary tract infection  Vision disturbance  UTI (lower urinary tract infection): Incidentally found. Patient was given one dose of Cipro prior to discharge and then discharged on a few more days for total of 3 days of therapy.   Diastolic CHF, chronic: BNP  checked and found to be normal at 367. Patient's 2-D echocardiogram done in December of 2010 was noted to have grade 2 diastolic dysfunction. Patient actually notes that she takes Lasix for edema but did not really note much about her heart failure we had an extensive discussion about what diastolic heart failure means. I provided her with an education book as well. She normally follows up with Dr. Jacinto Halim but has not seen him for several years and I advised her to make an appointment to see him in the next month for routine maintenance evaluation. Patient will start checking her weights on a daily basis. 2-D echo was ordered as part initial stroke rule out and has been completed but results are pending. Because patient is not in any florid failure and his, dumping she's had a form of cerebrovascular event, do not need to keep her in the hospital until results are back.   Discharge Condition: Improved, being discharged home  Diet recommendation: Heart healthy  Filed Weights   09/03/12 2359  Weight: 74.957 kg (165 lb 4 oz)    History of present illness:  77 year old white female past question of diastolic heart failure and hyperlipidemia presented to the emergency room after having an episode of perioral numbness and what she described as left side blurriness that lasted for about 20 minutes. The numbness resolved although the visual issue persisted. Initial head CT was negative. Patient was observed overnight by the hospital service for further evaluation.  Hospital Course:  Principal Problem:  *Subjective visual disturbance, right eye Active Problems:  HYPERLIPIDEMIA  Numbness around mouth  Vision disturbance  UTI (lower urinary tract infection)  Diastolic CHF, chronic    Procedures:  2-D echo: Pending  Carotid Dopplers: Preliminary negative  Consultations:  None  Discharge Exam: Filed Vitals:   09/04/12 0200 09/04/12 0605 09/04/12 0951 09/04/12 1230  BP: 95/46 101/45 106/57  116/58  Pulse: 62 62 59 62  Temp: 98.2 F (36.8 C) 97.5 F (36.4 C) 97.5 F (36.4 C) 98.2 F (36.8 C)  TempSrc:    Oral  Resp: 16 16 20 20   Height:      Weight:      SpO2: 98% 99% 99% 98%    General: Alert and oriented x3, no acute distress Cranial nerves: 2 through 12 are intact Cardiovascular: Regular rate and rhythm S1-S2 Respiratory: Clear auscultation bilaterally Abdomen: Soft, nontender, nondistended, positive bowel sounds Neurologic: No focal deficits  Discharge Instructions  Discharge Orders    Future Orders Please Complete By Expires   Diet - low sodium heart healthy      Increase activity slowly          Medication List     As of 09/04/2012  2:06 PM    STOP taking these medications         naproxen sodium 220 MG tablet   Commonly known as: ANAPROX      TAKE these medications         aspirin 81 MG EC tablet   Take 1 tablet (81 mg total) by mouth daily. Swallow whole.      atorvastatin 20 MG tablet   Commonly known as: LIPITOR   Take 20 mg by mouth daily.      CALCIUM 600 + D PO   Take 600 mg by mouth daily.      cholecalciferol 1000 UNITS tablet   Commonly known as: VITAMIN D   Take 1,000 Units by mouth daily.      ciprofloxacin 500 MG tablet   Commonly known as: CIPRO   Take 1 tablet (500 mg total) by mouth 2 (two) times daily.      CoQ10 100 MG Caps   Take 100 mg by mouth daily.      docusate sodium 100 MG capsule   Commonly known as: COLACE   Take 100 mg by mouth daily.      ESTRACE VA   Place 1 application vaginally daily as needed. For vaginal dryness      furosemide 20 MG tablet   Commonly known as: LASIX   Take 20 mg by mouth daily.      potassium chloride 10 MEQ tablet   Commonly known as: K-DUR,KLOR-CON   Take 10 mEq by mouth daily.           Follow-up Information    Follow up with Ezequiel Kayser, MD. (As scheduled)    Contact information:   [address] [place] [address] [phone]       Follow up with  Antony Contras, MD. Schedule an appointment as soon as possible for a visit in 2 weeks.   Contact information:   [address] [place] [address] [phone]       Follow up with Pamella Pert, MD. Schedule an appointment as soon as possible for a visit in 1 month.   Contact information:   [address] [place] [address] [phone]           The results of significant diagnostics from this hospitalization (including imaging, microbiology, ancillary and laboratory) are listed below for  reference.    Significant Diagnostic Studies: Ct Head Wo Contrast  09/03/2012    IMPRESSION: No acute intracranial abnormality.  Essentially negative noncontrast head CT.   Original Report Authenticated By: Malachy Moan, M.D.    Mr Brain Wo Contrast  09/04/2012  IMPRESSION: No acute or significant finding.  Minimal chronic small vessel change of the hemispheric white matter, less than often seen in healthy individuals of this age.  MRA HEAD  Findings: Both internal carotid arteries are widely patent into the brain.  No siphon stenosis.  The anterior and middle cerebral vessels are patent without proximal stenosis, aneurysm or vascular malformation.  Both vertebral arteries are patent to the basilar. No basilar stenosis.  Posterior circulation branch vessels are normal.  IMPRESSION: Normal intracranial MR angiography of the large and medium-sized vessels.   Original Report Authenticated By: Paulina Fusi, M.D.    Mr Mra Head/brain Wo Cm  09/04/2012  IMPRESSION: No acute or significant finding.  Minimal chronic small vessel change of the hemispheric white matter, less than often seen in healthy individuals of this age.  MRA HEAD  Findings: Both internal carotid arteries are widely patent into the brain.  No siphon stenosis.  The anterior and middle cerebral vessels are patent without proximal stenosis, aneurysm or vascular malformation.  Both vertebral arteries are patent to the  basilar. No basilar stenosis.  Posterior circulation branch vessels are normal.  IMPRESSION: Normal intracranial MR angiography of the large and medium-sized vessels.   Original Report Authenticated By: Paulina Fusi, M.D.     Microbiology: No results found for this or any previous visit (from the past 240 hour(s)).   Labs: Basic Metabolic Panel:  Lab 09/03/12 1610  NA 146*  K 4.0  CL 108  CO2 29  GLUCOSE 108*  BUN 19  CREATININE 0.86  CALCIUM 9.4  MG --  PHOS --   Liver Function Tests:  Lab 09/03/12 2110  AST 19  ALT 13  ALKPHOS 88  BILITOT 0.2*  PROT 6.9  ALBUMIN 3.8   CBC:  Lab 09/03/12 2110  WBC 5.1  NEUTROABS 2.9  HGB 13.1  HCT 38.8  MCV 91.9  PLT 161   Cardiac Enzymes:  Lab 09/03/12 2111  CKTOTAL --  CKMB --  CKMBINDEX --  TROPONINI <0.30   BNP: BNP (last 3 results)  Basename 09/04/12 1318  PROBNP 367.1*     Signed:  Glada Wickstrom K  [facility] 09/04/2012, 2:06 PM

## 2012-09-04 NOTE — Progress Notes (Signed)
  Echocardiogram 2D Echocardiogram has been performed.  Tura Roller, [facility] 09/04/2012, 11:41 AM

## 2012-09-04 NOTE — Progress Notes (Signed)
Bilateral:  No evidence of hemodynamically significant internal carotid artery stenosis.   Vertebral artery flow is antegrade.     

## 2012-09-04 NOTE — ED Provider Notes (Signed)
I saw and evaluated the patient, reviewed the resident's note and I agree with the findings and plan.  Visual changes and perioral numbness that have rapidly improved. No focal weakness, speech or swallowing difficulty. Visual fields full to confrontation, EOMI, PERRLA, CN2-12 intact, 5/5 strength throughout. Not tPA candidate 2/2 rapidly improving symptoms.    Glynn Octave, MD 09/04/12 [phone]

## 2012-09-05 LAB — URINE CULTURE: Colony Count: >=100000

## 2013-05-11 ENCOUNTER — Other Ambulatory Visit: Payer: Self-pay | Admitting: Neurosurgery

## 2013-05-11 DIAGNOSIS — M47812 Spondylosis without myelopathy or radiculopathy, cervical region: Secondary | ICD-10-CM

## 2013-05-25 ENCOUNTER — Ambulatory Visit
Admission: RE | Admit: 2013-05-25 | Discharge: 2013-05-25 | Disposition: A | Discharge status: Home / Self Care | Payer: Medicare Other | Source: Ambulatory Visit | Attending: Neurosurgery | Admitting: Neurosurgery

## 2013-05-25 DIAGNOSIS — M47812 Spondylosis without myelopathy or radiculopathy, cervical region: Secondary | ICD-10-CM

## 2013-05-25 MED ORDER — GADOBENATE DIMEGLUMINE 529 MG/ML IV SOLN
15.0000 mL | Freq: Once | INTRAVENOUS | Status: AC | PRN
  Administered 2013-05-25: 15 mL via INTRAVENOUS

## 2014-03-24 ENCOUNTER — Emergency Department (HOSPITAL_BASED_OUTPATIENT_CLINIC_OR_DEPARTMENT_OTHER)
Admission: EM | Admit: 2014-03-24 | Discharge: 2014-03-24 | Disposition: A | Discharge status: Home / Self Care | Payer: Medicare Other | Attending: Emergency Medicine | Admitting: Emergency Medicine

## 2014-03-24 ENCOUNTER — Encounter (HOSPITAL_BASED_OUTPATIENT_CLINIC_OR_DEPARTMENT_OTHER): Payer: Self-pay | Admitting: Emergency Medicine

## 2014-03-24 DIAGNOSIS — Z87891 Personal history of nicotine dependence: Secondary | ICD-10-CM | POA: Diagnosis not present

## 2014-03-24 DIAGNOSIS — E785 Hyperlipidemia, unspecified: Secondary | ICD-10-CM | POA: Insufficient documentation

## 2014-03-24 DIAGNOSIS — Z79899 Other long term (current) drug therapy: Secondary | ICD-10-CM | POA: Diagnosis not present

## 2014-03-24 DIAGNOSIS — R202 Paresthesia of skin: Secondary | ICD-10-CM

## 2014-03-24 DIAGNOSIS — R51 Headache: Secondary | ICD-10-CM | POA: Insufficient documentation

## 2014-03-24 DIAGNOSIS — R209 Unspecified disturbances of skin sensation: Secondary | ICD-10-CM | POA: Insufficient documentation

## 2014-03-24 DIAGNOSIS — Z7982 Long term (current) use of aspirin: Secondary | ICD-10-CM | POA: Diagnosis not present

## 2014-03-24 DIAGNOSIS — I10 Essential (primary) hypertension: Secondary | ICD-10-CM | POA: Diagnosis not present

## 2014-03-24 DIAGNOSIS — G4489 Other headache syndrome: Secondary | ICD-10-CM

## 2014-03-24 HISTORY — DX: Migraine, unspecified, not intractable, without status migrainosus: G43.909

## 2014-03-24 LAB — CBC WITH DIFFERENTIAL/PLATELET
Basophils Absolute: 0 10*3/uL (ref 0.0–0.1)
Basophils Relative: 1 % (ref 0–1)
Eosinophils Absolute: 0.1 10*3/uL (ref 0.0–0.7)
Eosinophils Relative: 2 % (ref 0–5)
HCT: 35.9 % — ABNORMAL LOW (ref 36.0–46.0)
Hemoglobin: 12 g/dL (ref 12.0–15.0)
Lymphocytes Relative: 25 % (ref 12–46)
Lymphs Abs: 0.9 10*3/uL (ref 0.7–4.0)
MCH: 31.8 pg (ref 26.0–34.0)
MCHC: 33.4 g/dL (ref 30.0–36.0)
MCV: 95.2 fL (ref 78.0–100.0)
Monocytes Absolute: 0.3 10*3/uL (ref 0.1–1.0)
Monocytes Relative: 9 % (ref 3–12)
Neutro Abs: 2.3 10*3/uL (ref 1.7–7.7)
Neutrophils Relative %: 63 % (ref 43–77)
Platelets: 134 10*3/uL — ABNORMAL LOW (ref 150–400)
RBC: 3.77 MIL/uL — ABNORMAL LOW (ref 3.87–5.11)
RDW: 12.6 % (ref 11.5–15.5)
WBC: 3.7 10*3/uL — ABNORMAL LOW (ref 4.0–10.5)

## 2014-03-24 LAB — BASIC METABOLIC PANEL
Anion gap: 8 (ref 5–15)
BUN: 20 mg/dL (ref 6–23)
CO2: 31 mEq/L (ref 19–32)
Calcium: 9.1 mg/dL (ref 8.4–10.5)
Chloride: 106 mEq/L (ref 96–112)
Creatinine, Ser: 0.9 mg/dL (ref 0.50–1.10)
GFR calc Af Amer: 71 mL/min — ABNORMAL LOW (ref >90)
GFR calc non Af Amer: 61 mL/min — ABNORMAL LOW (ref >90)
Glucose, Bld: 90 mg/dL (ref 70–99)
Potassium: 4.9 mEq/L (ref 3.7–5.3)
Sodium: 145 mEq/L (ref 137–147)

## 2014-03-24 NOTE — ED Notes (Signed)
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 This morning at 10 am she had blurred vision and then a headache. She came her today because she was concerned of numbness in her R hand that has passed some HA now.

## 2014-03-24 NOTE — Discharge Instructions (Signed)
Recurrent Migraine Headache A migraine headache is an intense, throbbing pain on one or both sides of your head. Recurrent migraines keep coming back. A migraine can last for 30 minutes to several hours. CAUSES  The exact cause of a migraine headache is not always known. However, a migraine may be caused when nerves in the brain become irritated and release chemicals that cause inflammation. This causes pain. Certain things may also trigger migraines, such as:   Alcohol.  Smoking.  Stress.  Menstruation.  Aged cheeses.  Foods or drinks that contain nitrates, glutamate, aspartame, or tyramine.  Lack of sleep.  Chocolate.  Caffeine.  Hunger.  Physical exertion.  Fatigue.  Medicines used to treat chest pain (nitroglycerine), birth control pills, estrogen, and some blood pressure medicines. SYMPTOMS   Pain on one or both sides of your head.  Pulsating or throbbing pain.  Severe pain that prevents daily activities.  Pain that is aggravated by any physical activity.  Nausea, vomiting, or both.  Dizziness.  Pain with exposure to bright lights, loud noises, or activity.  General sensitivity to bright lights, loud noises, or smells. Before you get a migraine, you may get warning signs that a migraine is coming (aura). An aura may include:  Seeing flashing lights.  Seeing bright spots, halos, or zigzag lines.  Having tunnel vision or blurred vision.  Having feelings of numbness or tingling.  Having trouble talking.  Having muscle weakness. DIAGNOSIS  A recurrent migraine headache is often diagnosed based on:  Symptoms.  Physical examination.  A CT scan or MRI of your head. These imaging tests cannot diagnose migraines but can help rule out other causes of headaches.  TREATMENT  Medicines may be given for pain and nausea. Medicines can also be given to help prevent recurrent migraines. HOME CARE INSTRUCTIONS  Only take over-the-counter or prescription  medicines for pain or discomfort as directed by your health care provider. The use of long-term narcotics is not recommended.  Lie down in a dark, quiet room when you have a migraine.  Keep a journal to find out what may trigger your migraine headaches. For example, write down:  What you eat and drink.  How much sleep you get.  Any change to your diet or medicines.  Limit alcohol consumption.  Quit smoking if you smoke.  Get 7-9 hours of sleep, or as recommended by your health care provider.  Limit stress.  Keep lights dim if bright lights bother you and make your migraines worse. SEEK MEDICAL CARE IF:   You do not get relief from the medicines given to you.  You have a recurrence of pain.  You have a fever. SEEK IMMEDIATE MEDICAL CARE IF:  Your migraine becomes severe.  You have a stiff neck.  You have loss of vision.  You have muscular weakness or loss of muscle control.  You start losing your balance or have trouble walking.  You feel faint or pass out.  You have severe symptoms that are different from your first symptoms. MAKE SURE YOU:   Understand these instructions.  Will watch your condition.  Will get help right away if you are not doing well or get worse. Document Released: 04/16/2001 Document Revised: 12/06/2013 Document Reviewed: 03/29/2013 Exit[employer] Patient Information 2015 [employer]. This information is not intended to replace advice given to you by your health care provider. Make sure you discuss any questions you have with your health care provider.  

## 2014-03-24 NOTE — ED Provider Notes (Signed)
CSN: [id number]     Arrival date & time 03/24/14  1221 History   First MD Initiated Contact with Patient 03/24/14 1249     Chief Complaint  Patient presents with  . Headache     (Consider location/radiation/quality/duration/timing/severity/associated sxs/prior Treatment) HPI Comments: Pt states she has complex migraines where she has episodes where she has visual changes and then she develops a headache. Pt states that she had her normal symptoms this morning, but then she developed some abnormal sensations in her right hand. Pt states that she had sensations that moved in her right hand and resolved after about 20 minutes. Pt states that it was numb, but she states that she was able to hold a fork and she could feel it against her fingers at the time. Pt states that she didn't have confusion, slurred speech, problems walking. Husband states that she was at her baseline  The history is provided by the patient. No language interpreter was used.    Past Medical History  Diagnosis Date  . Anginal pain   . Hyperlipemia   . Migraines   . Hypertension    Past Surgical History  Procedure Laterality Date  . Tonsillectomy    . Hemorrhoid surgery    . Back surgery     No family history on file. History  Substance Use Topics  . Smoking status: Former Games developermoker  . Smokeless tobacco: Not on file  . Alcohol Use: Yes   OB History   Grav Para Term Preterm Abortions TAB SAB Ect Mult Living                 Review of Systems  Constitutional: Negative.   Respiratory: Negative.   Cardiovascular: Negative.       Allergies  Fluconazole  Home Medications   Prior to Admission medications   Medication Sig Start Date End Date Taking? Authorizing Provider  aspirin 81 MG EC tablet Take 1 tablet (81 mg total) by mouth daily. Swallow whole. 09/04/12   Hollice EspySendil K Krishnan, MD  atorvastatin (LIPITOR) 20 MG tablet Take 20 mg by mouth daily.    Historical Provider, MD  Calcium Carbonate-Vitamin D  (CALCIUM 600 + D PO) Take 600 mg by mouth daily.    Historical Provider, MD  cholecalciferol (VITAMIN D) 1000 UNITS tablet Take 1,000 Units by mouth daily.    Historical Provider, MD  Coenzyme Q10 (COQ10) 100 MG CAPS Take 100 mg by mouth daily.    Historical Provider, MD  docusate sodium (COLACE) 100 MG capsule Take 100 mg by mouth daily.    Historical Provider, MD  Estradiol [facility](ESTRACE VA) Place 1 application vaginally daily as needed. For vaginal dryness    Historical Provider, MD  furosemide (LASIX) 20 MG tablet Take 20 mg by mouth daily.    Historical Provider, MD  potassium chloride (K-DUR,KLOR-CON) 10 MEQ tablet Take 10 mEq by mouth daily.    Historical Provider, MD   BP 164/68  Pulse 64  Temp(Src) 97.7 F (36.5 C) (Oral)  Ht 5\' 3"  (1.6 m)  Wt 163 lb (73.936 kg)  BMI 28.88 kg/m2  SpO2 97% Physical Exam  Nursing note and vitals reviewed. Constitutional: She is oriented to person, place, and time. She appears well-developed and well-nourished.  HENT:  Head: Normocephalic and atraumatic.  Cardiovascular: Normal rate and regular rhythm.   Pulmonary/Chest: Effort normal and breath sounds normal.  Abdominal: Bowel sounds are normal.  Musculoskeletal: Normal range of motion.  Neurological: She is alert and oriented to  person, place, and time. She exhibits normal muscle tone. Coordination normal. GCS eye subscore is 4. GCS verbal subscore is 5. GCS motor subscore is 6.  Grip  strength equal. No pronator drift. Finger to nose is normal  Skin: Skin is warm and dry.  Psychiatric: She has a normal mood and affect.    ED Course  Procedures (including critical care time) Labs Review Labs Reviewed  BASIC METABOLIC PANEL - Abnormal; Notable for the following:    GFR calc non Af Amer 61 (*)    GFR calc Af Amer 71 (*)    All other components within normal limits  CBC WITH DIFFERENTIAL - Abnormal; Notable for the following:    WBC 3.7 (*)    RBC 3.77 (*)    HCT 35.9 (*)    Platelets 134  (*)    All other components within normal limits    Imaging Review No results found.   EKG Interpretation None      MDM   Final diagnoses:  Other headache syndrome  Paresthesia    Pt continues to be symptom free at this time. Pt refusing out pt mri. Pt states that she will return here or follow up with pcp if return of symptoms    Teressa Lower, NP 03/24/14 1438

## 2014-03-28 NOTE — ED Provider Notes (Signed)
History/physical exam/procedure(s) were performed by non-physician practitioner and as supervising physician I was immediately available for consultation/collaboration. I have reviewed all notes and am in agreement with care and plan.   Jaycub Noorani S Ramond Darnell, MD 03/28/14 2025 

## 2014-06-08 ENCOUNTER — Other Ambulatory Visit: Payer: Self-pay | Admitting: Internal Medicine

## 2014-06-08 DIAGNOSIS — R519 Headache, unspecified: Secondary | ICD-10-CM

## 2014-06-08 DIAGNOSIS — R51 Headache: Principal | ICD-10-CM

## 2014-06-12 ENCOUNTER — Ambulatory Visit
Admission: RE | Admit: 2014-06-12 | Discharge: 2014-06-12 | Disposition: A | Discharge status: Home / Self Care | Payer: Medicare Other | Source: Ambulatory Visit | Attending: Internal Medicine | Admitting: Internal Medicine

## 2014-06-12 DIAGNOSIS — R51 Headache: Principal | ICD-10-CM

## 2014-06-12 DIAGNOSIS — R519 Headache, unspecified: Secondary | ICD-10-CM

## 2015-01-20 ENCOUNTER — Other Ambulatory Visit: Payer: Self-pay | Admitting: Internal Medicine

## 2015-01-20 DIAGNOSIS — K7689 Other specified diseases of liver: Secondary | ICD-10-CM

## 2015-01-20 DIAGNOSIS — R1084 Generalized abdominal pain: Secondary | ICD-10-CM

## 2015-02-02 ENCOUNTER — Ambulatory Visit
Admission: RE | Admit: 2015-02-02 | Discharge: 2015-02-02 | Disposition: A | Discharge status: Home / Self Care | Payer: Medicare Other | Source: Ambulatory Visit | Attending: Internal Medicine | Admitting: Internal Medicine

## 2015-02-02 DIAGNOSIS — R1084 Generalized abdominal pain: Secondary | ICD-10-CM

## 2015-02-02 DIAGNOSIS — K7689 Other specified diseases of liver: Secondary | ICD-10-CM

## 2015-02-02 MED ORDER — GADOBENATE DIMEGLUMINE 529 MG/ML IV SOLN
15.0000 mL | Freq: Once | INTRAVENOUS | Status: AC | PRN
Start: 2015-02-02 — End: 2015-02-02
  Administered 2015-02-02: 15 mL via INTRAVENOUS

## 2015-09-07 ENCOUNTER — Ambulatory Visit: Payer: Self-pay | Admitting: Orthopedic Surgery

## 2015-09-07 NOTE — Progress Notes (Signed)
Preoperative surgical orders have been place into the Epic hospital system for Linda Lee on 09/07/2015, 12:50 PM  by Patrica Duel for surgery on 09-25-15.  Preop Total Knee orders including Experal, IV Tylenol, and IV Decadron as long as there are no contraindications to the above medications. Avel Peace, PA-C

## 2015-09-15 ENCOUNTER — Encounter (HOSPITAL_COMMUNITY)
Admission: RE | Admit: 2015-09-15 | Discharge: 2015-09-15 | Disposition: A | Discharge status: Home / Self Care | Payer: Medicare Other | Source: Ambulatory Visit | Attending: Orthopedic Surgery | Admitting: Orthopedic Surgery

## 2015-09-15 ENCOUNTER — Encounter (HOSPITAL_COMMUNITY): Payer: Self-pay

## 2015-09-15 DIAGNOSIS — Z01818 Encounter for other preprocedural examination: Secondary | ICD-10-CM | POA: Insufficient documentation

## 2015-09-15 DIAGNOSIS — Z0183 Encounter for blood typing: Secondary | ICD-10-CM | POA: Insufficient documentation

## 2015-09-15 DIAGNOSIS — M1711 Unilateral primary osteoarthritis, right knee: Secondary | ICD-10-CM | POA: Insufficient documentation

## 2015-09-15 DIAGNOSIS — Z01812 Encounter for preprocedural laboratory examination: Secondary | ICD-10-CM | POA: Insufficient documentation

## 2015-09-15 HISTORY — DX: Gastro-esophageal reflux disease without esophagitis: K21.9

## 2015-09-15 HISTORY — DX: Unspecified osteoarthritis, unspecified site: M19.90

## 2015-09-15 LAB — URINALYSIS, ROUTINE W REFLEX MICROSCOPIC
Bilirubin Urine: NEGATIVE
Glucose, UA: NEGATIVE mg/dL
Hgb urine dipstick: NEGATIVE
Ketones, ur: NEGATIVE mg/dL
Nitrite: NEGATIVE
Protein, ur: NEGATIVE mg/dL
Specific Gravity, Urine: 1.012 (ref 1.005–1.030)
pH: 5 (ref 5.0–8.0)

## 2015-09-15 LAB — CBC
HCT: 38.8 % (ref 36.0–46.0)
Hemoglobin: 13 g/dL (ref 12.0–15.0)
MCH: 31.1 pg (ref 26.0–34.0)
MCHC: 33.5 g/dL (ref 30.0–36.0)
MCV: 92.8 fL (ref 78.0–100.0)
Platelets: 163 10*3/uL (ref 150–400)
RBC: 4.18 MIL/uL (ref 3.87–5.11)
RDW: 12.9 % (ref 11.5–15.5)
WBC: 4.3 10*3/uL (ref 4.0–10.5)

## 2015-09-15 LAB — COMPREHENSIVE METABOLIC PANEL
ALT: 13 U/L — ABNORMAL LOW (ref 14–54)
AST: 18 U/L (ref 15–41)
Albumin: 4.2 g/dL (ref 3.5–5.0)
Alkaline Phosphatase: 87 U/L (ref 38–126)
Anion gap: 10 (ref 5–15)
BUN: 25 mg/dL — ABNORMAL HIGH (ref 6–20)
CO2: 26 mmol/L (ref 22–32)
Calcium: 9.1 mg/dL (ref 8.9–10.3)
Chloride: 106 mmol/L (ref 101–111)
Creatinine, Ser: 0.7 mg/dL (ref 0.44–1.00)
GFR calc Af Amer: >60 mL/min (ref >60)
GFR calc non Af Amer: >60 mL/min (ref >60)
Glucose, Bld: 97 mg/dL (ref 65–99)
Potassium: 4.1 mmol/L (ref 3.5–5.1)
Sodium: 142 mmol/L (ref 135–145)
Total Bilirubin: 0.9 mg/dL (ref 0.3–1.2)
Total Protein: 7.1 g/dL (ref 6.5–8.1)

## 2015-09-15 LAB — SURGICAL PCR SCREEN
MRSA, PCR: NEGATIVE
Staphylococcus aureus: NEGATIVE

## 2015-09-15 LAB — URINE MICROSCOPIC-ADD ON

## 2015-09-15 LAB — APTT: aPTT: 31 seconds (ref 24–37)

## 2015-09-15 LAB — PROTIME-INR
INR: 1.06 (ref 0.00–1.49)
Prothrombin Time: 13.6 seconds (ref 11.6–15.2)

## 2015-09-15 LAB — ABO/RH: ABO/RH(D): A POS

## 2015-09-15 NOTE — Progress Notes (Signed)
09-15-15 - CMP lab results from preop visit on 09-15-15 faxed to Dr. Lequita Halt via [facility]

## 2015-09-15 NOTE — Patient Instructions (Signed)
Linda Lee  09/15/2015   Your procedure is scheduled on: September 25, 2015  Report to [facility] Main  Entrance take [facility]  elevators to 3rd floor to  Short Stay Center at 7:30 AM.  Call this number if you have problems the morning of surgery [phone]   Remember: ONLY 1 PERSON MAY GO WITH YOU TO SHORT STAY TO GET  READY MORNING OF YOUR SURGERY.  Do not eat food or drink liquids :After Midnight.     Take these medicines the morning of surgery with A SIP OF WATER: Prilosec DO NOT TAKE ANY DIABETIC MEDICATIONS DAY OF YOUR SURGERY                               You may not have any metal on your body including hair pins and              piercings  Do not wear jewelry, make-up, lotions, powders or perfumes, deodorant             Do not wear nail polish.  Do not shave  48 hours prior to surgery.               Do not bring valuables to the hospital. [facility] IS NOT             RESPONSIBLE   FOR VALUABLES.  Contacts, dentures or bridgework may not be worn into surgery.  Leave suitcase in the car. After surgery it may be brought to your room.      Special Instructions: coughing and deep breathing exercises, leg exercises              Please read over the following fact sheets you were given: _____________________________________________________________________             [facility] - Preparing for Surgery Before surgery, you can play an important role.  Because skin is not sterile, your skin needs to be as free of germs as possible.  You can reduce the number of germs on your skin by washing with CHG (chlorahexidine gluconate) soap before surgery.  CHG is an antiseptic cleaner which kills germs and bonds with the skin to continue killing germs even after washing. Please DO NOT use if you have an allergy to CHG or antibacterial soaps.  If your skin becomes reddened/irritated stop using the CHG and inform your nurse when you arrive at Short Stay. Do  not shave (including legs and underarms) for at least 48 hours prior to the first CHG shower.  You may shave your face/neck. Please follow these instructions carefully:  1.  Shower with CHG Soap the night before surgery and the  morning of Surgery.  2.  If you choose to wash your hair, wash your hair first as usual with your  normal  shampoo.  3.  After you shampoo, rinse your hair and body thoroughly to remove the  shampoo.                           4.  Use CHG as you would any other liquid soap.  You can apply chg directly  to the skin and wash  Gently with a scrungie or clean washcloth.  5.  Apply the CHG Soap to your body ONLY FROM THE NECK DOWN.   Do not use on face/ open                           Wound or open sores. Avoid contact with eyes, ears mouth and genitals (private parts).                       Wash face,  Genitals (private parts) with your normal soap.             6.  Wash thoroughly, paying special attention to the area where your surgery  will be performed.  7.  Thoroughly rinse your body with warm water from the neck down.  8.  DO NOT shower/wash with your normal soap after using and rinsing off  the CHG Soap.                9.  Pat yourself dry with a clean towel.            10.  Wear clean pajamas.            11.  Place clean sheets on your bed the night of your first shower and do not  sleep with pets. Day of Surgery : Do not apply any lotions/deodorants the morning of surgery.  Please wear clean clothes to the hospital/surgery center.  FAILURE TO FOLLOW THESE INSTRUCTIONS MAY RESULT IN THE CANCELLATION OF YOUR SURGERY PATIENT SIGNATURE_________________________________  NURSE SIGNATURE__________________________________  ________________________________________________________________________  WHAT IS A BLOOD TRANSFUSION? Blood Transfusion Information  A transfusion is the replacement of blood or some of its parts. Blood is made up of multiple  cells which provide different functions.  Red blood cells carry oxygen and are used for blood loss replacement.  White blood cells fight against infection.  Platelets control bleeding.  Plasma helps clot blood.  Other blood products are available for specialized needs, such as hemophilia or other clotting disorders. BEFORE THE TRANSFUSION  Who gives blood for transfusions?   Healthy volunteers who are fully evaluated to make sure their blood is safe. This is blood bank blood. Transfusion therapy is the safest it has ever been in the practice of medicine. Before blood is taken from a donor, a complete history is taken to make sure that person has no history of diseases nor engages in risky social behavior (examples are intravenous drug use or sexual activity with multiple partners). The donor's travel history is screened to minimize risk of transmitting infections, such as malaria. The donated blood is tested for signs of infectious diseases, such as HIV and hepatitis. The blood is then tested to be sure it is compatible with you in order to minimize the chance of a transfusion reaction. If you or a relative donates blood, this is often done in anticipation of surgery and is not appropriate for emergency situations. It takes many days to process the donated blood. RISKS AND COMPLICATIONS Although transfusion therapy is very safe and saves many lives, the main dangers of transfusion include:  1. Getting an infectious disease. 2. Developing a transfusion reaction. This is an allergic reaction to something in the blood you were given. Every precaution is taken to prevent this. The decision to have a blood transfusion has been considered carefully by your caregiver before blood is given. Blood is not given unless the benefits outweigh  the risks. AFTER THE TRANSFUSION  Right after receiving a blood transfusion, you will usually feel much better and more energetic. This is especially true if your red  blood cells have gotten low (anemic). The transfusion raises the level of the red blood cells which carry oxygen, and this usually causes an energy increase.  The nurse administering the transfusion will monitor you carefully for complications. HOME CARE INSTRUCTIONS  No special instructions are needed after a transfusion. You may find your energy is better. Speak with your caregiver about any limitations on activity for underlying diseases you may have. SEEK MEDICAL CARE IF:   Your condition is not improving after your transfusion.  You develop redness or irritation at the intravenous (IV) site. SEEK IMMEDIATE MEDICAL CARE IF:  Any of the following symptoms occur over the next 12 hours:  Shaking chills.  You have a temperature by mouth above 102 F (38.9 C), not controlled by medicine.  Chest, back, or muscle pain.  People around you feel you are not acting correctly or are confused.  Shortness of breath or difficulty breathing.  Dizziness and fainting.  You get a rash or develop hives.  You have a decrease in urine output.  Your urine turns a dark color or changes to pink, red, or brown. Any of the following symptoms occur over the next 10 days:  You have a temperature by mouth above 102 F (38.9 C), not controlled by medicine.  Shortness of breath.  Weakness after normal activity.  The white part of the eye turns yellow (jaundice).  You have a decrease in the amount of urine or are urinating less often.  Your urine turns a dark color or changes to pink, red, or brown. Document Released: 07/19/2000 Document Revised: 10/14/2011 Document Reviewed: 03/07/2008 Exit[employer] Patient Information 2014 [place].  _______________________________________________________________________  Incentive Spirometer  An incentive spirometer is a tool that can help keep your lungs clear and active. This tool measures how well you are filling your lungs with each breath. Taking  long deep breaths may help reverse or decrease the chance of developing breathing (pulmonary) problems (especially infection) following:  A long period of time when you are unable to move or be active. BEFORE THE PROCEDURE   If the spirometer includes an indicator to show your best effort, your nurse or respiratory therapist will set it to a desired goal.  If possible, sit up straight or lean slightly forward. Try not to slouch.  Hold the incentive spirometer in an upright position. INSTRUCTIONS FOR USE  3. Sit on the edge of your bed if possible, or sit up as far as you can in bed or on a chair. 4. Hold the incentive spirometer in an upright position. 5. Breathe out normally. 6. Place the mouthpiece in your mouth and seal your lips tightly around it. 7. Breathe in slowly and as deeply as possible, raising the piston or the ball toward the top of the column. 8. Hold your breath for 3-5 seconds or for as long as possible. Allow the piston or ball to fall to the bottom of the column. 9. Remove the mouthpiece from your mouth and breathe out normally. 10. Rest for a few seconds and repeat Steps 1 through 7 at least 10 times every 1-2 hours when you are awake. Take your time and take a few normal breaths between deep breaths. 11. The spirometer may include an indicator to show your best effort. Use the indicator as a goal to work  toward during each repetition. 12. After each set of 10 deep breaths, practice coughing to be sure your lungs are clear. If you have an incision (the cut made at the time of surgery), support your incision when coughing by placing a pillow or rolled up towels firmly against it. Once you are able to get out of bed, walk around indoors and cough well. You may stop using the incentive spirometer when instructed by your caregiver.  RISKS AND COMPLICATIONS  Take your time so you do not get dizzy or light-headed.  If you are in pain, you may need to take or ask for pain  medication before doing incentive spirometry. It is harder to take a deep breath if you are having pain. AFTER USE  Rest and breathe slowly and easily.  It can be helpful to keep track of a log of your progress. Your caregiver can provide you with a simple table to help with this. If you are using the spirometer at home, follow these instructions: [place] IF:   You are having difficultly using the spirometer.  You have trouble using the spirometer as often as instructed.  Your pain medication is not giving enough relief while using the spirometer.  You develop fever of 100.5 F (38.1 C) or higher. SEEK IMMEDIATE MEDICAL CARE IF:   You cough up bloody sputum that had not been present before.  You develop fever of 102 F (38.9 C) or greater.  You develop worsening pain at or near the incision site. MAKE SURE YOU:   Understand these instructions.  Will watch your condition.  Will get help right away if you are not doing well or get worse. Document Released: 12/02/2006 Document Revised: 10/14/2011 Document Reviewed: 02/02/2007 [facility] Patient Information 2014 [address] [place], [place].   ________________________________________________________________________

## 2015-09-21 NOTE — H&P (Signed)
TOTAL KNEE ADMISSION H&P  Patient is being admitted for right total knee arthroplasty.  Subjective:  Chief Complaint:right knee pain.  HPI: Linda Lee, 80 y.o. female, has a history of pain and functional disability in the right knee due to arthritis and has failed non-surgical conservative treatments for greater than 12 weeks to includeNSAID's and/or analgesics, corticosteriod injections, viscosupplementation injections, use of assistive devices and activity modification.  Onset of symptoms was gradual, starting >10 years ago with gradually worsening course since that time. The patient noted no past surgery on the right knee(s).  Patient currently rates pain in the right knee(s) at 8 out of 10 with activity. Patient has night pain, worsening of pain with activity and weight bearing, pain that interferes with activities of daily living, pain with passive range of motion, crepitus and joint swelling.  Patient has evidence of periarticular osteophytes and joint space narrowing by imaging studies. There is no active infection.  Patient Active Problem List   Diagnosis Date Noted  . UTI (lower urinary tract infection) 09/04/2012  . Diastolic CHF, chronic (HCC) 09/04/2012  . Subjective visual disturbance, right eye 09/03/2012  . Numbness around mouth 09/03/2012  . Vision disturbance 09/03/2012  . THORACIC AORTIC ANEURYSM 07/06/2009  . CHEST PAIN UNSPECIFIED 07/06/2009  . HYPERLIPIDEMIA 06/30/2009  . GERD 06/30/2009  . LEG CRAMPS 06/30/2009   Past Medical History  Diagnosis Date  . Anginal pain (HCC)   . Hyperlipemia   . Migraines   . GERD (gastroesophageal reflux disease)   . Arthritis     Past Surgical History  Procedure Laterality Date  . Tonsillectomy    . Hemorrhoid surgery    . Back surgery    . Thoracicc aortic estasia 07/2009       Current outpatient prescriptions:  .  atorvastatin (LIPITOR) 20 MG tablet, Take 20 mg by mouth 3 (three) times a week. MWF, Disp: , Rfl:   .  cholecalciferol (VITAMIN D) 1000 UNITS tablet, Take 1,000 Units by mouth every morning. , Disp: , Rfl:  .  Coenzyme Q10 (COQ10) 100 MG CAPS, Take 300 mg by mouth 3 (three) times a week. MWF, Disp: , Rfl:  .  Cranberry (SM CRANBERRY) 300 MG tablet, Take 300 mg by mouth every morning., Disp: , Rfl:  .  Cyanocobalamin (VITAMIN B 12 PO), Take 2,500 mg by mouth every morning., Disp: , Rfl:  .  docusate sodium (COLACE) 100 MG capsule, Take 100 mg by mouth every morning. , Disp: , Rfl:  .  Estradiol (ESTRACE VA), Place 1 application vaginally daily as needed (vaginal dryness.). , Disp: , Rfl:  .  furosemide (LASIX) 20 MG tablet, Take 20 mg by mouth every morning. , Disp: , Rfl:  .  ibuprofen (ADVIL,MOTRIN) 200 MG tablet, Take 400 mg by mouth every 6 (six) hours as needed for moderate pain., Disp: , Rfl:  .  Omega-3 Fatty Acids (OMEGA 3 PO), Take 300 mg by mouth every morning., Disp: , Rfl:  .  omeprazole (PRILOSEC OTC) 20 MG tablet, Take 20 mg by mouth daily as needed (acid reflux)., Disp: , Rfl:  .  potassium chloride (K-DUR,KLOR-CON) 10 MEQ tablet, Take 10 mEq by mouth every morning. , Disp: , Rfl:  .  aspirin 81 MG EC tablet, Take 1 tablet (81 mg total) by mouth daily. Swallow whole. (Patient taking differently: Take 81 mg by mouth every morning. ), Disp: 30 tablet, Rfl: 12  Allergies  Allergen Reactions  . Fluconazole Rash  Social History  Substance Use Topics  . Smoking status: Former Smoker    Quit date: 08/15/1963  . Smokeless tobacco: Never Used  . Alcohol Use: 0.6 oz/week    1 Glasses of wine per week     Comment: almost every night      Review of Systems  Constitutional: Negative.   HENT: Positive for hearing loss and tinnitus. Negative for congestion, ear discharge, ear pain, nosebleeds and sore throat.   Eyes: Negative.   Respiratory: Negative.  Negative for stridor.   Cardiovascular: Negative.   Gastrointestinal: Positive for heartburn. Negative for nausea, vomiting,  abdominal pain, diarrhea, constipation, blood in stool and melena.  Genitourinary: Negative.   Musculoskeletal: Positive for myalgias and joint pain. Negative for back pain, falls and neck pain.  Skin: Negative.   Neurological: Negative.  Negative for headaches.  Endo/Heme/Allergies: Negative.   Psychiatric/Behavioral: Negative.     Objective:  Physical Exam  Constitutional: She appears well-developed. No distress.  Overweight  HENT:  Head: Normocephalic and atraumatic.  Right Ear: External ear normal.  Left Ear: External ear normal.  Nose: Nose normal.  Mouth/Throat: Oropharynx is clear and moist.  Eyes: Conjunctivae and EOM are normal.  Neck: Normal range of motion. Neck supple.  Cardiovascular: Normal rate, regular rhythm, normal heart sounds and intact distal pulses.   No murmur heard. Respiratory: Effort normal and breath sounds normal. No respiratory distress. She has no wheezes.  GI: Soft. Bowel sounds are normal. She exhibits no distension. There is no tenderness.  Musculoskeletal:       Right hip: Normal.       Left hip: Normal.  Her right knee shows no effusion. There is a moderate crepitus on range of motion in the knee. She has slight tenderness medially without significant lateral tenderness. There is no instability.  Neurological: She has normal strength. No sensory deficit.  Skin: No rash noted. She is not diaphoretic. No erythema.  Psychiatric: She has a normal mood and affect. Her behavior is normal.    Vitals  Weight: 164 lb Height: 65.5in Body Surface Area: 1.83 m Body Mass Index: 26.88 kg/m  Pulse: 76 (Regular)  BP: 118/74 (Sitting, Left Arm, Standard)  Imaging Review Plain radiographs demonstrate severe degenerative joint disease of the right knee(s). The overall alignment ismild varus. The bone quality appears to be good for age and reported activity level.  Assessment/Plan:  End stage primary osteoarthritis, right knee   The patient  history, physical examination, clinical judgment of the provider and imaging studies are consistent with end stage degenerative joint disease of the right knee(s) and total knee arthroplasty is deemed medically necessary. The treatment options including medical management, injection therapy arthroscopy and arthroplasty were discussed at length. The risks and benefits of total knee arthroplasty were presented and reviewed. The risks due to aseptic loosening, infection, stiffness, patella tracking problems, thromboembolic complications and other imponderables were discussed. The patient acknowledged the explanation, agreed to proceed with the plan and consent was signed. Patient is being admitted for inpatient treatment for surgery, pain control, PT, OT, prophylactic antibiotics, VTE prophylaxis, progressive ambulation and ADL's and discharge planning. The patient is planning to be discharged home with home health services    Wants Tamala Ser with [facility]  PCP: Dr. Baron Hamper, PA-C

## 2015-09-25 ENCOUNTER — Inpatient Hospital Stay (HOSPITAL_COMMUNITY): Payer: Medicare Other | Admitting: Certified Registered"

## 2015-09-25 ENCOUNTER — Inpatient Hospital Stay (HOSPITAL_COMMUNITY)
Admission: RE | Admit: 2015-09-25 | Discharge: 2015-09-27 | DRG: 470 | Disposition: A | Discharge status: Home Health | Payer: Medicare Other | Source: Ambulatory Visit | Attending: Orthopedic Surgery | Admitting: Orthopedic Surgery

## 2015-09-25 ENCOUNTER — Encounter (HOSPITAL_COMMUNITY)
Admission: RE | Disposition: A | Discharge status: Home Health | Payer: Self-pay | Source: Ambulatory Visit | Attending: Orthopedic Surgery

## 2015-09-25 ENCOUNTER — Encounter (HOSPITAL_COMMUNITY): Payer: Self-pay | Admitting: *Deleted

## 2015-09-25 DIAGNOSIS — Z7982 Long term (current) use of aspirin: Secondary | ICD-10-CM | POA: Diagnosis not present

## 2015-09-25 DIAGNOSIS — M1711 Unilateral primary osteoarthritis, right knee: Principal | ICD-10-CM | POA: Diagnosis present

## 2015-09-25 DIAGNOSIS — M1710 Unilateral primary osteoarthritis, unspecified knee: Secondary | ICD-10-CM | POA: Diagnosis present

## 2015-09-25 DIAGNOSIS — Z6826 Body mass index (BMI) 26.0-26.9, adult: Secondary | ICD-10-CM | POA: Diagnosis not present

## 2015-09-25 DIAGNOSIS — H9190 Unspecified hearing loss, unspecified ear: Secondary | ICD-10-CM | POA: Diagnosis present

## 2015-09-25 DIAGNOSIS — Z87891 Personal history of nicotine dependence: Secondary | ICD-10-CM | POA: Diagnosis not present

## 2015-09-25 DIAGNOSIS — M25561 Pain in right knee: Secondary | ICD-10-CM | POA: Diagnosis present

## 2015-09-25 DIAGNOSIS — I5032 Chronic diastolic (congestive) heart failure: Secondary | ICD-10-CM | POA: Diagnosis present

## 2015-09-25 DIAGNOSIS — Z01812 Encounter for preprocedural laboratory examination: Secondary | ICD-10-CM

## 2015-09-25 DIAGNOSIS — E663 Overweight: Secondary | ICD-10-CM | POA: Diagnosis present

## 2015-09-25 DIAGNOSIS — K219 Gastro-esophageal reflux disease without esophagitis: Secondary | ICD-10-CM | POA: Diagnosis present

## 2015-09-25 DIAGNOSIS — M179 Osteoarthritis of knee, unspecified: Secondary | ICD-10-CM | POA: Diagnosis present

## 2015-09-25 DIAGNOSIS — E785 Hyperlipidemia, unspecified: Secondary | ICD-10-CM | POA: Diagnosis present

## 2015-09-25 HISTORY — PX: TOTAL KNEE ARTHROPLASTY: SHX125

## 2015-09-25 LAB — TYPE AND SCREEN
ABO/RH(D): A POS
Antibody Screen: NEGATIVE

## 2015-09-25 SURGERY — ARTHROPLASTY, KNEE, TOTAL
Anesthesia: Monitor Anesthesia Care | Site: Knee | Laterality: Right

## 2015-09-25 MED ORDER — SODIUM CHLORIDE 0.9 % IV SOLN: INTRAVENOUS | Status: DC

## 2015-09-25 MED ORDER — BUPIVACAINE HCL (PF) 0.25 % IJ SOLN
INTRAMUSCULAR | Status: AC
  Filled 2015-09-25: qty 30

## 2015-09-25 MED ORDER — FUROSEMIDE 20 MG PO TABS
20.0000 mg | ORAL_TABLET | Freq: Every morning | ORAL | Status: DC
  Administered 2015-09-26 – 2015-09-27 (×2): 20 mg via ORAL
  Filled 2015-09-25 (×2): qty 1

## 2015-09-25 MED ORDER — BUPIVACAINE IN DEXTROSE 0.75-8.25 % IT SOLN
INTRATHECAL | Status: DC | PRN
  Administered 2015-09-25: 1.6 mL via INTRATHECAL

## 2015-09-25 MED ORDER — DEXAMETHASONE SODIUM PHOSPHATE 10 MG/ML IJ SOLN
10.0000 mg | Freq: Once | INTRAMUSCULAR | Status: AC
  Administered 2015-09-25: 10 mg via INTRAVENOUS

## 2015-09-25 MED ORDER — DEXTROSE 5 % IV SOLN
500.0000 mg | Freq: Four times a day (QID) | INTRAVENOUS | Status: DC | PRN
  Administered 2015-09-25: 500 mg via INTRAVENOUS
  Filled 2015-09-25 (×2): qty 5

## 2015-09-25 MED ORDER — CEFAZOLIN SODIUM-DEXTROSE 2-3 GM-% IV SOLR
2.0000 g | INTRAVENOUS | Status: AC
  Administered 2015-09-25: 2 g via INTRAVENOUS

## 2015-09-25 MED ORDER — BISACODYL 10 MG RE SUPP: 10.0000 mg | Freq: Every day | RECTAL | Status: DC | PRN

## 2015-09-25 MED ORDER — ONDANSETRON HCL 4 MG/2ML IJ SOLN
4.0000 mg | Freq: Four times a day (QID) | INTRAMUSCULAR | Status: DC | PRN
  Administered 2015-09-27: 4 mg via INTRAVENOUS
  Filled 2015-09-25: qty 2

## 2015-09-25 MED ORDER — MIDAZOLAM HCL 2 MG/2ML IJ SOLN
INTRAMUSCULAR | Status: AC
  Filled 2015-09-25: qty 2

## 2015-09-25 MED ORDER — METOCLOPRAMIDE HCL 10 MG PO TABS: 5.0000 mg | ORAL_TABLET | Freq: Three times a day (TID) | ORAL | Status: DC | PRN

## 2015-09-25 MED ORDER — LIDOCAINE HCL (CARDIAC) 20 MG/ML IV SOLN
INTRAVENOUS | Status: AC
  Filled 2015-09-25: qty 5

## 2015-09-25 MED ORDER — TRANEXAMIC ACID 1000 MG/10ML IV SOLN
2000.0000 mg | INTRAVENOUS | Status: DC | PRN
  Administered 2015-09-25: 2000 mg via TOPICAL

## 2015-09-25 MED ORDER — LACTATED RINGERS IV SOLN
INTRAVENOUS | Status: DC | PRN
  Administered 2015-09-25 (×2): via INTRAVENOUS

## 2015-09-25 MED ORDER — BUPIVACAINE HCL 0.25 % IJ SOLN
INTRAMUSCULAR | Status: DC | PRN
  Administered 2015-09-25: 20 mL

## 2015-09-25 MED ORDER — MENTHOL 3 MG MT LOZG: 1.0000 | LOZENGE | OROMUCOSAL | Status: DC | PRN

## 2015-09-25 MED ORDER — FENTANYL CITRATE (PF) 100 MCG/2ML IJ SOLN
INTRAMUSCULAR | Status: AC
  Filled 2015-09-25: qty 2

## 2015-09-25 MED ORDER — RIVAROXABAN 10 MG PO TABS
10.0000 mg | ORAL_TABLET | Freq: Every day | ORAL | Status: DC
  Administered 2015-09-26 – 2015-09-27 (×2): 10 mg via ORAL
  Filled 2015-09-25 (×3): qty 1

## 2015-09-25 MED ORDER — METOCLOPRAMIDE HCL 5 MG/ML IJ SOLN: 5.0000 mg | Freq: Three times a day (TID) | INTRAMUSCULAR | Status: DC | PRN

## 2015-09-25 MED ORDER — BUPIVACAINE LIPOSOME 1.3 % IJ SUSP
INTRAMUSCULAR | Status: DC | PRN
  Administered 2015-09-25: 20 mL

## 2015-09-25 MED ORDER — FENTANYL CITRATE (PF) 100 MCG/2ML IJ SOLN
INTRAMUSCULAR | Status: DC | PRN
  Administered 2015-09-25: 100 ug via INTRAVENOUS

## 2015-09-25 MED ORDER — FLEET ENEMA 7-19 GM/118ML RE ENEM: 1.0000 | ENEMA | Freq: Once | RECTAL | Status: DC | PRN

## 2015-09-25 MED ORDER — POTASSIUM CHLORIDE CRYS ER 10 MEQ PO TBCR
10.0000 meq | EXTENDED_RELEASE_TABLET | Freq: Every morning | ORAL | Status: DC
  Administered 2015-09-26 – 2015-09-27 (×2): 10 meq via ORAL
  Filled 2015-09-25 (×2): qty 1

## 2015-09-25 MED ORDER — MORPHINE SULFATE (PF) 2 MG/ML IV SOLN
1.0000 mg | INTRAVENOUS | Status: DC | PRN
  Administered 2015-09-25 (×3): 1 mg via INTRAVENOUS
  Filled 2015-09-25 (×3): qty 1

## 2015-09-25 MED ORDER — PROPOFOL 500 MG/50ML IV EMUL
INTRAVENOUS | Status: DC | PRN
  Administered 2015-09-25: 50 ug/kg/min via INTRAVENOUS

## 2015-09-25 MED ORDER — CEFAZOLIN SODIUM-DEXTROSE 2-3 GM-% IV SOLR
INTRAVENOUS | Status: AC
  Filled 2015-09-25: qty 50

## 2015-09-25 MED ORDER — ATORVASTATIN CALCIUM 20 MG PO TABS
20.0000 mg | ORAL_TABLET | ORAL | Status: DC
  Administered 2015-09-25 – 2015-09-27 (×2): 20 mg via ORAL
  Filled 2015-09-25 (×2): qty 1

## 2015-09-25 MED ORDER — PROPOFOL 10 MG/ML IV BOLUS
INTRAVENOUS | Status: AC
  Filled 2015-09-25: qty 20

## 2015-09-25 MED ORDER — ACETAMINOPHEN 325 MG PO TABS: 325.0000 mg | ORAL_TABLET | ORAL | Status: DC | PRN

## 2015-09-25 MED ORDER — ACETAMINOPHEN 10 MG/ML IV SOLN
1000.0000 mg | Freq: Once | INTRAVENOUS | Status: AC
  Administered 2015-09-25: 1000 mg via INTRAVENOUS

## 2015-09-25 MED ORDER — OXYCODONE HCL 5 MG PO TABS
5.0000 mg | ORAL_TABLET | ORAL | Status: DC | PRN
  Administered 2015-09-25 – 2015-09-27 (×10): 10 mg via ORAL
  Filled 2015-09-25 (×12): qty 2

## 2015-09-25 MED ORDER — LIDOCAINE HCL (CARDIAC) 20 MG/ML IV SOLN
INTRAVENOUS | Status: DC | PRN
  Administered 2015-09-25: 50 mg via INTRAVENOUS

## 2015-09-25 MED ORDER — SODIUM CHLORIDE 0.9 % IJ SOLN
INTRAMUSCULAR | Status: DC | PRN
  Administered 2015-09-25: 30 mL

## 2015-09-25 MED ORDER — OXYCODONE HCL 5 MG PO TABS: 5.0000 mg | ORAL_TABLET | Freq: Once | ORAL | Status: DC | PRN

## 2015-09-25 MED ORDER — DEXAMETHASONE SODIUM PHOSPHATE 10 MG/ML IJ SOLN
INTRAMUSCULAR | Status: AC
  Filled 2015-09-25: qty 1

## 2015-09-25 MED ORDER — ONDANSETRON HCL 4 MG/2ML IJ SOLN
INTRAMUSCULAR | Status: AC
  Filled 2015-09-25: qty 2

## 2015-09-25 MED ORDER — DOCUSATE SODIUM 100 MG PO CAPS
100.0000 mg | ORAL_CAPSULE | Freq: Two times a day (BID) | ORAL | Status: DC
  Administered 2015-09-25 – 2015-09-27 (×4): 100 mg via ORAL

## 2015-09-25 MED ORDER — OMEPRAZOLE MAGNESIUM 20 MG PO TBEC: 20.0000 mg | DELAYED_RELEASE_TABLET | Freq: Every day | ORAL | Status: DC | PRN

## 2015-09-25 MED ORDER — BUPIVACAINE LIPOSOME 1.3 % IJ SUSP
20.0000 mL | Freq: Once | INTRAMUSCULAR | Status: DC
  Filled 2015-09-25: qty 20

## 2015-09-25 MED ORDER — SODIUM CHLORIDE 0.9 % IV SOLN
INTRAVENOUS | Status: DC
  Administered 2015-09-25: 15:00:00 via INTRAVENOUS

## 2015-09-25 MED ORDER — DEXAMETHASONE SODIUM PHOSPHATE 10 MG/ML IJ SOLN
10.0000 mg | Freq: Once | INTRAMUSCULAR | Status: AC
  Administered 2015-09-26: 10 mg via INTRAVENOUS
  Filled 2015-09-25: qty 1

## 2015-09-25 MED ORDER — CHLORHEXIDINE GLUCONATE 4 % EX LIQD: 60.0000 mL | Freq: Once | CUTANEOUS | Status: DC

## 2015-09-25 MED ORDER — METHOCARBAMOL 500 MG PO TABS
500.0000 mg | ORAL_TABLET | Freq: Four times a day (QID) | ORAL | Status: DC | PRN
  Administered 2015-09-25 – 2015-09-27 (×3): 500 mg via ORAL
  Filled 2015-09-25 (×3): qty 1

## 2015-09-25 MED ORDER — CEFAZOLIN SODIUM-DEXTROSE 2-3 GM-% IV SOLR
2.0000 g | Freq: Four times a day (QID) | INTRAVENOUS | Status: AC
  Administered 2015-09-25 (×2): 2 g via INTRAVENOUS
  Filled 2015-09-25 (×2): qty 50

## 2015-09-25 MED ORDER — PHENOL 1.4 % MT LIQD: 1.0000 | OROMUCOSAL | Status: DC | PRN

## 2015-09-25 MED ORDER — ACETAMINOPHEN 10 MG/ML IV SOLN
INTRAVENOUS | Status: AC
  Filled 2015-09-25: qty 100

## 2015-09-25 MED ORDER — OMEPRAZOLE 20 MG PO CPDR
20.0000 mg | DELAYED_RELEASE_CAPSULE | Freq: Every day | ORAL | Status: DC
  Administered 2015-09-26 – 2015-09-27 (×2): 20 mg via ORAL
  Filled 2015-09-25 (×2): qty 1

## 2015-09-25 MED ORDER — OXYCODONE HCL 5 MG/5ML PO SOLN: 5.0000 mg | Freq: Once | ORAL | Status: DC | PRN

## 2015-09-25 MED ORDER — ACETAMINOPHEN 160 MG/5ML PO SOLN: 325.0000 mg | ORAL | Status: DC | PRN

## 2015-09-25 MED ORDER — ACETAMINOPHEN 500 MG PO TABS
1000.0000 mg | ORAL_TABLET | Freq: Four times a day (QID) | ORAL | Status: AC
  Administered 2015-09-25 – 2015-09-26 (×3): 1000 mg via ORAL
  Filled 2015-09-25 (×7): qty 2

## 2015-09-25 MED ORDER — TRAMADOL HCL 50 MG PO TABS: 50.0000 mg | ORAL_TABLET | Freq: Four times a day (QID) | ORAL | Status: DC | PRN

## 2015-09-25 MED ORDER — ONDANSETRON HCL 4 MG/2ML IJ SOLN
INTRAMUSCULAR | Status: DC | PRN
  Administered 2015-09-25: 4 mg via INTRAVENOUS

## 2015-09-25 MED ORDER — HYDROMORPHONE HCL 1 MG/ML IJ SOLN: 0.2500 mg | INTRAMUSCULAR | Status: DC | PRN

## 2015-09-25 MED ORDER — ACETAMINOPHEN 650 MG RE SUPP: 650.0000 mg | Freq: Four times a day (QID) | RECTAL | Status: DC | PRN

## 2015-09-25 MED ORDER — POLYETHYLENE GLYCOL 3350 17 G PO PACK: 17.0000 g | PACK | Freq: Every day | ORAL | Status: DC | PRN

## 2015-09-25 MED ORDER — ONDANSETRON HCL 4 MG PO TABS: 4.0000 mg | ORAL_TABLET | Freq: Four times a day (QID) | ORAL | Status: DC | PRN

## 2015-09-25 MED ORDER — TRANEXAMIC ACID 1000 MG/10ML IV SOLN
2000.0000 mg | Freq: Once | INTRAVENOUS | Status: DC
  Filled 2015-09-25: qty 20

## 2015-09-25 MED ORDER — MIDAZOLAM HCL 5 MG/5ML IJ SOLN
INTRAMUSCULAR | Status: DC | PRN
  Administered 2015-09-25: 2 mg via INTRAVENOUS

## 2015-09-25 MED ORDER — ACETAMINOPHEN 325 MG PO TABS: 650.0000 mg | ORAL_TABLET | Freq: Four times a day (QID) | ORAL | Status: DC | PRN

## 2015-09-25 MED ORDER — DIPHENHYDRAMINE HCL 12.5 MG/5ML PO ELIX: 12.5000 mg | ORAL_SOLUTION | ORAL | Status: DC | PRN

## 2015-09-25 SURGICAL SUPPLY — 50 items
BAG DECANTER FOR FLEXI CONT (MISCELLANEOUS) ×2 IMPLANT
BAG SPEC THK2 15X12 ZIP CLS (MISCELLANEOUS) ×1
BAG ZIPLOCK 12X15 (MISCELLANEOUS) ×2 IMPLANT
BANDAGE ACE 6X5 VEL STRL LF (GAUZE/BANDAGES/DRESSINGS) ×2 IMPLANT
BLADE SAG 18X100X1.27 (BLADE) ×2 IMPLANT
BLADE SAW SGTL 11.0X1.19X90.0M (BLADE) ×2 IMPLANT
BOWL SMART MIX CTS (DISPOSABLE) ×2 IMPLANT
CAP KNEE TOTAL 3 SIGMA ×1 IMPLANT
CEMENT HV SMART SET (Cement) ×4 IMPLANT
CLOTH BEACON ORANGE TIMEOUT ST (SAFETY) ×2 IMPLANT
CUFF TOURN SGL QUICK 34 (TOURNIQUET CUFF) ×2
CUFF TRNQT CYL 34X4X40X1 (TOURNIQUET CUFF) ×1 IMPLANT
DECANTER SPIKE VIAL GLASS SM (MISCELLANEOUS) ×2 IMPLANT
DRAPE U-SHAPE 47X51 STRL (DRAPES) ×2 IMPLANT
DRSG ADAPTIC 3X8 NADH LF (GAUZE/BANDAGES/DRESSINGS) ×2 IMPLANT
DRSG PAD ABDOMINAL 8X10 ST (GAUZE/BANDAGES/DRESSINGS) ×2 IMPLANT
DURAPREP 26ML APPLICATOR (WOUND CARE) ×2 IMPLANT
ELECT REM PT RETURN 9FT ADLT (ELECTROSURGICAL) ×2
ELECTRODE REM PT RTRN 9FT ADLT (ELECTROSURGICAL) ×1 IMPLANT
EVACUATOR 1/8 PVC DRAIN (DRAIN) ×2 IMPLANT
GAUZE SPONGE 4X4 12PLY STRL (GAUZE/BANDAGES/DRESSINGS) ×2 IMPLANT
GLOVE BIO SURGEON STRL SZ7.5 (GLOVE) ×1 IMPLANT
GLOVE BIO SURGEON STRL SZ8 (GLOVE) ×2 IMPLANT
GLOVE BIOGEL PI IND STRL 6.5 (GLOVE) IMPLANT
GLOVE BIOGEL PI IND STRL 8 (GLOVE) ×1 IMPLANT
GLOVE BIOGEL PI INDICATOR 6.5 (GLOVE)
GLOVE BIOGEL PI INDICATOR 8 (GLOVE) ×2
GLOVE SURG SS PI 6.5 STRL IVOR (GLOVE) IMPLANT
GOWN STRL REUS W/TWL LRG LVL3 (GOWN DISPOSABLE) ×2 IMPLANT
GOWN STRL REUS W/TWL XL LVL3 (GOWN DISPOSABLE) ×1 IMPLANT
HANDPIECE INTERPULSE COAX TIP (DISPOSABLE) ×2
IMMOBILIZER KNEE 20 (SOFTGOODS) ×2
IMMOBILIZER KNEE 20 THIGH 36 (SOFTGOODS) ×1 IMPLANT
MANIFOLD NEPTUNE II (INSTRUMENTS) ×2 IMPLANT
NS IRRIG 1000ML POUR BTL (IV SOLUTION) ×2 IMPLANT
PACK TOTAL KNEE CUSTOM (KITS) ×2 IMPLANT
PADDING CAST COTTON 6X4 STRL (CAST SUPPLIES) ×5 IMPLANT
POSITIONER SURGICAL ARM (MISCELLANEOUS) ×2 IMPLANT
SET HNDPC FAN SPRY TIP SCT (DISPOSABLE) ×1 IMPLANT
STRIP CLOSURE SKIN 1/2X4 (GAUZE/BANDAGES/DRESSINGS) ×4 IMPLANT
SUT MNCRL AB 4-0 PS2 18 (SUTURE) ×2 IMPLANT
SUT VIC AB 2-0 CT1 27 (SUTURE) ×6
SUT VIC AB 2-0 CT1 TAPERPNT 27 (SUTURE) ×3 IMPLANT
SUT VLOC 180 0 24IN GS25 (SUTURE) ×2 IMPLANT
SYR 50ML LL SCALE MARK (SYRINGE) ×2 IMPLANT
TRAY FOLEY W/METER SILVER 14FR (SET/KITS/TRAYS/PACK) ×2 IMPLANT
TRAY FOLEY W/METER SILVER 16FR (SET/KITS/TRAYS/PACK) ×1 IMPLANT
WATER STERILE IRR 1500ML POUR (IV SOLUTION) ×2 IMPLANT
WRAP KNEE MAXI GEL POST OP (GAUZE/BANDAGES/DRESSINGS) ×2 IMPLANT
YANKAUER SUCT BULB TIP 10FT TU (MISCELLANEOUS) ×2 IMPLANT

## 2015-09-25 NOTE — Anesthesia Postprocedure Evaluation (Signed)
Anesthesia Post Note  Patient: Linda Lee  Procedure(s) Performed: Procedure(s) (LRB): TOTAL KNEE ARTHROPLASTY (Right)  Patient location during evaluation: PACU Anesthesia Type: Spinal and MAC Level of consciousness: awake Pain management: pain level controlled Vital Signs Assessment: post-procedure vital signs reviewed and stable Respiratory status: spontaneous breathing Cardiovascular status: stable Postop Assessment: no signs of nausea or vomiting and spinal receding Anesthetic complications: no    Last Vitals:  Filed Vitals:   09/25/15 1422 09/25/15 1555  BP: 110/42 118/56  Pulse: 59 58  Temp: 36.5 C 36.5 C  Resp: 17 20    Last Pain:  Filed Vitals:   09/25/15 1736  PainSc: 0-No pain                 Maral Lampe

## 2015-09-25 NOTE — Evaluation (Signed)
Physical Therapy Evaluation Patient Details Name: Linda Lee MRN: [id number] DOB: 13-Feb-1936 Today's Date: 09/25/2015   History of Present Illness  R TKA  Clinical Impression  Pt is s/p TKA resulting in the deficits listed below (see PT Problem List). Min A for ambulating 3' with RW bed to recliner. Instructed pt in TKA exercises. Good progress expected.  Pt will benefit from skilled PT to increase their independence and safety with mobility to allow discharge to the venue listed below.      Follow Up Recommendations Home health PT;Supervision for mobility/OOB    Equipment Recommendations  None recommended by PT    Recommendations for Other Services OT consult     Precautions / Restrictions Precautions Precautions: Knee Restrictions Weight Bearing Restrictions: No      Mobility  Bed Mobility Overal bed mobility: Needs Assistance Bed Mobility: Supine to Sit     Supine to sit: Min assist     General bed mobility comments: self assisted RLE with LLE, min A to raise trunk, verbal cues for technique  Transfers Overall transfer level: Needs assistance Equipment used: Rolling walker (2 wheeled) Transfers: Sit to/from Stand Sit to Stand: Min assist         General transfer comment: verbal cues for hand placement, min A to rise/steady  Ambulation/Gait Ambulation/Gait assistance: Min assist Ambulation Distance (Feet): 3 Feet Assistive device: Rolling walker (2 wheeled) Gait Pattern/deviations: Step-to pattern     General Gait Details: pivotal steps bed to recliner, verbal cues for sequencing, distance limited by pain/fatigue  Stairs            Wheelchair Mobility    Modified Rankin (Stroke Patients Only)       Balance Overall balance assessment: Needs assistance   Sitting balance-Leahy Scale: Good       Standing balance-Leahy Scale: Poor Standing balance comment: relies on BUE support                             Pertinent  Vitals/Pain Pain Assessment: 0-10 Pain Score: 6  Pain Location: R knee with activity Pain Intervention(s): Premedicated before session;Monitored during session;Ice applied;Limited activity within patient's tolerance    Home Living Family/patient expects to be discharged to:: Private residence Living Arrangements: Spouse/significant other Available Help at Discharge: Family;Available 24 hours/day Type of Home: House Home Access: Ramped entrance     Home Layout: Able to live on main level with bedroom/bathroom;Two level Home Equipment: Walker - 2 wheels;Walker - 4 wheels;Shower seat;Bedside commode;Grab bars - toilet;Grab bars - tub/shower;Hand held shower head      Prior Function Level of Independence: Independent with assistive device(s)         Comments: used RW for a few days PTA due to recently twisting knee     Hand Dominance        Extremity/Trunk Assessment   Upper Extremity Assessment: Overall WFL for tasks assessed           Lower Extremity Assessment: RLE deficits/detail RLE Deficits / Details: 5-45* R Knee AAROM, 3/5 SLR, ankle WNL    Cervical / Trunk Assessment: Normal  Communication   Communication: No difficulties  Cognition Arousal/Alertness: Awake/alert Behavior During Therapy: WFL for tasks assessed/performed Overall Cognitive Status: Within Functional Limits for tasks assessed                      General Comments      Exercises Total Joint Exercises Ankle  Circles/Pumps: AROM;Both;10 reps;Supine Quad Sets: AROM;Both;5 reps;Supine Heel Slides: AAROM;Right;10 reps;Supine Straight Leg Raises: AROM;Right;5 reps Long Arc Quad: AROM;Right;5 reps;Seated Goniometric ROM: 5-45* AAROM R knee      Assessment/Plan    PT Assessment Patient needs continued PT services  PT Diagnosis Difficulty walking   PT Problem List Decreased strength;Decreased range of motion;Decreased activity tolerance;Pain;Decreased mobility;Decreased knowledge of  use of DME  PT Treatment Interventions DME instruction;Gait training;Stair training;Functional mobility training;Therapeutic activities;Patient/family education;Therapeutic exercise   PT Goals (Current goals can be found in the Care Plan section) Acute Rehab PT Goals Patient Stated Goal: to walk her dog PT Goal Formulation: With patient/family Time For Goal Achievement: 10/09/15 Potential to Achieve Goals: Good    Frequency 7X/week   Barriers to discharge        Co-evaluation               End of Session Equipment Utilized During Treatment: Gait belt Activity Tolerance: Patient tolerated treatment well Patient left: in chair;with call bell/phone within reach;with family/visitor present Nurse Communication: Mobility status         Time: 4098-1191 PT Time Calculation (min) (ACUTE ONLY): 29 min   Charges:   PT Evaluation $PT Eval Low Complexity: 1 Procedure PT Treatments $Therapeutic Exercise: 8-22 mins   PT G Codes:        Tamala Ser 09/25/2015, 4:54 PM [phone]

## 2015-09-25 NOTE — Anesthesia Preprocedure Evaluation (Addendum)
Anesthesia Evaluation  Patient identified by MRN, date of birth, ID band Patient awake    Reviewed: Allergy & Precautions, NPO status , Patient's Chart, lab work & pertinent test results  History of Anesthesia Complications Negative for: history of anesthetic complications  Airway Mallampati: III  TM Distance: <3 FB Neck ROM: Full    Dental  (+) Teeth Intact   Pulmonary neg shortness of breath, neg sleep apnea, neg COPD, neg recent URI, former smoker, neg PE   breath sounds clear to auscultation       Cardiovascular (-) angina+ Peripheral Vascular Disease and +CHF   Rhythm:Regular     Neuro/Psych  Headaches, L4/5 laminectomy  TIA   GI/Hepatic Neg liver ROS, GERD  Medicated and Controlled,  Endo/Other    Renal/GU negative Renal ROS     Musculoskeletal  (+) Arthritis ,   Abdominal   Peds  Hematology negative hematology ROS (+)   Anesthesia Other Findings 4x4 cm ascending aneurysm in 2013, trileaflet aortic valve with 55-60% EF  Reproductive/Obstetrics                            Anesthesia Physical Anesthesia Plan  ASA: II  Anesthesia Plan: MAC and Spinal   Post-op Pain Management:    Induction: Intravenous  Airway Management Planned: Nasal Cannula  Additional Equipment: None  Intra-op Plan:   Post-operative Plan:   Informed Consent: I have reviewed the patients History and Physical, chart, labs and discussed the procedure including the risks, benefits and alternatives for the proposed anesthesia with the patient or authorized representative who has indicated his/her understanding and acceptance.   Dental advisory given  Plan Discussed with: CRNA and Surgeon  Anesthesia Plan Comments:         Anesthesia Quick Evaluation

## 2015-09-25 NOTE — Interval H&P Note (Signed)
History and Physical Interval Note:  09/25/2015 8:54 AM  Linda Lee  has presented today for surgery, with the diagnosis of OA RIGHT KNEE  The various methods of treatment have been discussed with the patient and family. After consideration of risks, benefits and other options for treatment, the patient has consented to  Procedure(s): TOTAL KNEE ARTHROPLASTY (Right) as a surgical intervention .  The patient's history has been reviewed, patient examined, no change in status, stable for surgery.  I have reviewed the patient's chart and labs.  Questions were answered to the patient's satisfaction.     Loanne Drilling

## 2015-09-25 NOTE — Transfer of Care (Signed)
Immediate Anesthesia Transfer of Care Note  Patient: Linda Lee  Procedure(s) Performed: Procedure(s): TOTAL KNEE ARTHROPLASTY (Right)  Patient Location: PACU  Anesthesia Type:Spinal  Level of Consciousness:  sedated, patient cooperative and responds to stimulation  Airway & Oxygen Therapy:Patient Spontanous Breathing and Patient connected to face mask oxgen  Post-op Assessment:  Report given to PACU RN and Post -op Vital signs reviewed and stable  Post vital signs:  Reviewed and stable  Last Vitals:  Filed Vitals:   09/25/15 0715  BP: 133/68  Pulse: 65  Temp: 36.3 C  Resp: 16    Complications: No apparent anesthesia complications

## 2015-09-25 NOTE — Op Note (Signed)
Pre-operative diagnosis- Osteoarthritis  Right knee(s)  Post-operative diagnosis- Osteoarthritis Right knee(s)  Procedure-  Right  Total Knee Arthroplasty  Surgeon- Linda Rankin. Ehsan Corvin, MD  Assistant- Avel Peace, PA-C   Anesthesia-  Spinal  EBL-* No blood loss amount entered *   Drains Hemovac  Tourniquet time-  Total Tourniquet Time Documented: Thigh (Right) - 30 minutes Total: Thigh (Right) - 30 minutes     Complications- None  Condition-PACU - hemodynamically stable.   Brief Clinical Note  Linda Lee is a 80 y.o. year old female with end stage OA of her right knee with progressively worsening pain and dysfunction. She has constant pain, with activity and at rest and significant functional deficits with difficulties even with ADLs. She has had extensive non-op management including analgesics, injections of cortisone and viscosupplements, and home exercise program, but remains in significant pain with significant dysfunction.Radiographs show bone on bone arthritis medial and patellofemoral. She presents now for right Total Knee Arthroplasty.    Procedure in detail---   The patient is brought into the operating room and positioned supine on the operating table. After successful administration of  Spinal,   a tourniquet is placed high on the  Right thigh(s) and the lower extremity is prepped and draped in the usual sterile fashion. Time out is performed by the operating team and then the  Right lower extremity is wrapped in Esmarch, knee flexed and the tourniquet inflated to 300 mmHg.       A midline incision is made with a ten blade through the subcutaneous tissue to the level of the extensor mechanism. A fresh blade is used to make a medial parapatellar arthrotomy. Soft tissue over the proximal medial tibia is subperiosteally elevated to the joint line with a knife and into the semimembranosus bursa with a Cobb elevator. Soft tissue over the proximal lateral tibia is elevated  with attention being paid to avoiding the patellar tendon on the tibial tubercle. The patella is everted, knee flexed 90 degrees and the ACL and PCL are removed. Findings are bone on bone medial and patellofemoral with large global ostoephytes.        The drill is used to create a starting hole in the distal femur and the canal is thoroughly irrigated with sterile saline to remove the fatty contents. The 5 degree Right  valgus alignment guide is placed into the femoral canal and the distal femoral cutting block is pinned to remove 10 mm off the distal femur. Resection is made with an oscillating saw.      The tibia is subluxed forward and the menisci are removed. The extramedullary alignment guide is placed referencing proximally at the medial aspect of the tibial tubercle and distally along the second metatarsal axis and tibial crest. The block is pinned to remove 2mm off the more deficient medial   side. Resection is made with an oscillating saw. Size 4is the most appropriate size for the tibia and the proximal tibia is prepared with the modular drill and keel punch for that size.      The femoral sizing guide is placed and size 4 is most appropriate. Rotation is marked off the epicondylar axis and confirmed by creating a rectangular flexion gap at 90 degrees. The size 4 cutting block is pinned in this rotation and the anterior, posterior and chamfer cuts are made with the oscillating saw. The intercondylar block is then placed and that cut is made.      Trial size 4 tibial component,  trial size 4 narrow posterior stabilized femur and a 12.5  mm posterior stabilized rotating platform insert trial is placed. Full extension is achieved with excellent varus/valgus and anterior/posterior balance throughout full range of motion. The patella is everted and thickness measured to be 24  mm. Free hand resection is taken to 14 mm, a 38 template is placed, lug holes are drilled, trial patella is placed, and it tracks  normally. Osteophytes are removed off the posterior femur with the trial in place. All trials are removed and the cut bone surfaces prepared with pulsatile lavage. Cement is mixed and once ready for implantation, the size 4 tibial implant, size  4 narrow posterior stabilized femoral component, and the size 38 patella are cemented in place and the patella is held with the clamp. The trial insert is placed and the knee held in full extension. The Exparel (20 ml mixed with 30 ml saline) and .25% Bupivicaine, are injected into the extensor mechanism, posterior capsule, medial and lateral gutters and subcutaneous tissues.  All extruded cement is removed and once the cement is hard the permanent 12.5 mm posterior stabilized rotating platform insert is placed into the tibial tray.      The wound is copiously irrigated with saline solution and the extensor mechanism closed over a hemovac drain with #1 V-loc suture. The tourniquet is released for a total tourniquet time of 30  minutes. Flexion against gravity is 140 degrees and the patella tracks normally. Subcutaneous tissue is closed with 2.0 vicryl and subcuticular with running 4.0 Monocryl. The incision is cleaned and dried and steri-strips and a bulky sterile dressing are applied. The limb is placed into a knee immobilizer and the patient is awakened and transported to recovery in stable condition.      Please note that a surgical assistant was a medical necessity for this procedure in order to perform it in a safe and expeditious manner. Surgical assistant was necessary to retract the ligaments and vital neurovascular structures to prevent injury to them and also necessary for proper positioning of the limb to allow for anatomic placement of the prosthesis.   Linda Rankin [name], MD    09/25/2015, 10:50 AM

## 2015-09-25 NOTE — Anesthesia Procedure Notes (Signed)
Spinal Patient location during procedure: OR End time: 09/25/2015 9:55 AM Staffing Resident/CRNA: Noralyn Pick D Performed by: resident/CRNA  Preanesthetic Checklist Completed: patient identified, site marked, surgical consent, pre-op evaluation, timeout performed, IV checked, risks and benefits discussed and monitors and equipment checked Spinal Block Patient position: sitting Prep: Betadine Patient monitoring: heart rate, continuous pulse ox and blood pressure Approach: midline Location: L3-4 Injection technique: single-shot Needle Needle type: Sprotte  Needle gauge: 24 G Needle length: 9 cm Assessment Sensory level: T6 Additional Notes Expiration date of kit checked and confirmed. Patient tolerated procedure well, without complications.

## 2015-09-25 NOTE — Progress Notes (Signed)
Utilization review completed.  

## 2015-09-26 LAB — BASIC METABOLIC PANEL
Anion gap: 6 (ref 5–15)
BUN: 14 mg/dL (ref 6–20)
CO2: 26 mmol/L (ref 22–32)
Calcium: 8.6 mg/dL — ABNORMAL LOW (ref 8.9–10.3)
Chloride: 110 mmol/L (ref 101–111)
Creatinine, Ser: 0.85 mg/dL (ref 0.44–1.00)
GFR calc Af Amer: >60 mL/min (ref >60)
GFR calc non Af Amer: >60 mL/min (ref >60)
Glucose, Bld: 116 mg/dL — ABNORMAL HIGH (ref 65–99)
Potassium: 4.4 mmol/L (ref 3.5–5.1)
Sodium: 142 mmol/L (ref 135–145)

## 2015-09-26 LAB — CBC
HCT: 32.2 % — ABNORMAL LOW (ref 36.0–46.0)
Hemoglobin: 10.5 g/dL — ABNORMAL LOW (ref 12.0–15.0)
MCH: 31.3 pg (ref 26.0–34.0)
MCHC: 32.6 g/dL (ref 30.0–36.0)
MCV: 96.1 fL (ref 78.0–100.0)
Platelets: 126 10*3/uL — ABNORMAL LOW (ref 150–400)
RBC: 3.35 MIL/uL — ABNORMAL LOW (ref 3.87–5.11)
RDW: 13 % (ref 11.5–15.5)
WBC: 7.6 10*3/uL (ref 4.0–10.5)

## 2015-09-26 MED ORDER — RIVAROXABAN 10 MG PO TABS: 10.0000 mg | ORAL_TABLET | Freq: Every day | ORAL | Status: DC

## 2015-09-26 MED ORDER — METHOCARBAMOL 500 MG PO TABS: 500.0000 mg | ORAL_TABLET | Freq: Four times a day (QID) | ORAL | Status: DC | PRN

## 2015-09-26 MED ORDER — OXYCODONE HCL 5 MG PO TABS: 5.0000 mg | ORAL_TABLET | ORAL | Status: DC | PRN

## 2015-09-26 MED ORDER — TRAMADOL HCL 50 MG PO TABS: 50.0000 mg | ORAL_TABLET | Freq: Four times a day (QID) | ORAL | Status: DC | PRN

## 2015-09-26 NOTE — Discharge Summary (Signed)
Physician Discharge Summary   Patient ID: MUSKAN BOLLA MRN: [id number] DOB/AGE: 1935-08-24 80 y.o.  Admit date: 09/25/2015 Discharge date: 09/27/15  Primary Diagnosis:  Osteoarthritis Right knee(s) Admission Diagnoses:  Past Medical History  Diagnosis Date  . Anginal pain ([place])   . Hyperlipemia   . Migraines   . GERD (gastroesophageal reflux disease)   . Arthritis    Discharge Diagnoses:   Principal Problem:   OA (osteoarthritis) of knee  Estimated body mass index is 26.21 kg/(m^2) as calculated from the following:   Height as of this encounter: 5' 5.5" (1.664 m).   Weight as of this encounter: 72.576 kg (160 lb).  Procedure:  Procedure(s) (LRB): TOTAL KNEE ARTHROPLASTY (Right)   Consults: None  HPI: NIMSI MALES is a 80 y.o. year old female with end stage OA of her right knee with progressively worsening pain and dysfunction. She has constant pain, with activity and at rest and significant functional deficits with difficulties even with ADLs. She has had extensive non-op management including analgesics, injections of cortisone and viscosupplements, and home exercise program, but remains in significant pain with significant dysfunction.Radiographs show bone on bone arthritis medial and patellofemoral. She presents now for right Total Knee Arthroplasty.  Laboratory Data: Admission on 09/25/2015  Component Date Value Ref Range Status  . WBC 09/26/2015 7.6  4.0 - 10.5 K/uL Final  . RBC 09/26/2015 3.35* 3.87 - 5.11 MIL/uL Final  . Hemoglobin 09/26/2015 10.5* 12.0 - 15.0 g/dL Final  . HCT 09/26/2015 32.2* 36.0 - 46.0 % Final  . MCV 09/26/2015 96.1  78.0 - 100.0 fL Final  . MCH 09/26/2015 31.3  26.0 - 34.0 pg Final  . MCHC 09/26/2015 32.6  30.0 - 36.0 g/dL Final  . RDW 09/26/2015 13.0  11.5 - 15.5 % Final  . Platelets 09/26/2015 126* 150 - 400 K/uL Final  . Sodium 09/26/2015 142  135 - 145 mmol/L Final  . Potassium 09/26/2015 4.4  3.5 - 5.1 mmol/L Final  .  Chloride 09/26/2015 110  101 - 111 mmol/L Final  . CO2 09/26/2015 26  22 - 32 mmol/L Final  . Glucose, Bld 09/26/2015 116* 65 - 99 mg/dL Final  . BUN 09/26/2015 14  6 - 20 mg/dL Final  . Creatinine, Ser 09/26/2015 0.85  0.44 - 1.00 mg/dL Final  . Calcium 09/26/2015 8.6* 8.9 - 10.3 mg/dL Final  . GFR calc non Af Amer 09/26/2015 >60  >60 mL/min Final  . GFR calc Af Amer 09/26/2015 >60  >60 mL/min Final   Comment: (NOTE) The eGFR has been calculated using the CKD EPI equation. This calculation has not been validated in all clinical situations. eGFR's persistently <60 mL/min signify possible Chronic Kidney Disease.   Georgiann Hahn gap 09/26/2015 6  5 - 15 Final  Hospital Outpatient Visit on 09/15/2015  Component Date Value Ref Range Status  . MRSA, PCR 09/15/2015 NEGATIVE  NEGATIVE Final  . Staphylococcus aureus 09/15/2015 NEGATIVE  NEGATIVE Final   Comment:        The Xpert SA Assay (FDA approved for NASAL specimens in patients over 13 years of age), is one component of a comprehensive surveillance program.  Test performance has been validated by [facility] for patients greater than or equal to 38 year old. It is not intended to diagnose infection nor to guide or monitor treatment.   Marland Kitchen aPTT 09/15/2015 31  24 - 37 seconds Final  . WBC 09/15/2015 4.3  4.0 - 10.5 K/uL Final  .  RBC 09/15/2015 4.18  3.87 - 5.11 MIL/uL Final  . Hemoglobin 09/15/2015 13.0  12.0 - 15.0 g/dL Final  . HCT 09/15/2015 38.8  36.0 - 46.0 % Final  . MCV 09/15/2015 92.8  78.0 - 100.0 fL Final  . MCH 09/15/2015 31.1  26.0 - 34.0 pg Final  . MCHC 09/15/2015 33.5  30.0 - 36.0 g/dL Final  . RDW 09/15/2015 12.9  11.5 - 15.5 % Final  . Platelets 09/15/2015 163  150 - 400 K/uL Final  . Sodium 09/15/2015 142  135 - 145 mmol/L Final  . Potassium 09/15/2015 4.1  3.5 - 5.1 mmol/L Final  . Chloride 09/15/2015 106  101 - 111 mmol/L Final  . CO2 09/15/2015 26  22 - 32 mmol/L Final  . Glucose, Bld 09/15/2015 97  65 - 99 mg/dL  Final  . BUN 09/15/2015 25* 6 - 20 mg/dL Final  . Creatinine, Ser 09/15/2015 0.70  0.44 - 1.00 mg/dL Final  . Calcium 09/15/2015 9.1  8.9 - 10.3 mg/dL Final  . Total Protein 09/15/2015 7.1  6.5 - 8.1 g/dL Final  . Albumin 09/15/2015 4.2  3.5 - 5.0 g/dL Final  . AST 09/15/2015 18  15 - 41 U/L Final  . ALT 09/15/2015 13* 14 - 54 U/L Final  . Alkaline Phosphatase 09/15/2015 87  38 - 126 U/L Final  . Total Bilirubin 09/15/2015 0.9  0.3 - 1.2 mg/dL Final  . GFR calc non Af Amer 09/15/2015 >60  >60 mL/min Final  . GFR calc Af Amer 09/15/2015 >60  >60 mL/min Final   Comment: (NOTE) The eGFR has been calculated using the CKD EPI equation. This calculation has not been validated in all clinical situations. eGFR's persistently <60 mL/min signify possible Chronic Kidney Disease.   . Anion gap 09/15/2015 10  5 - 15 Final  . Prothrombin Time 09/15/2015 13.6  11.6 - 15.2 seconds Final  . INR 09/15/2015 1.06  0.00 - 1.49 Final  . ABO/RH(D) 09/15/2015 A POS   Final  . Antibody Screen 09/15/2015 NEG   Final  . Sample Expiration 09/15/2015 09/28/2015   Final  . Extend sample reason 09/15/2015 NO TRANSFUSIONS OR PREGNANCY IN THE PAST 3 MONTHS   Final  . Color, Urine 09/15/2015 YELLOW  YELLOW Final  . APPearance 09/15/2015 CLEAR  CLEAR Final  . Specific Gravity, Urine 09/15/2015 1.012  1.005 - 1.030 Final  . pH 09/15/2015 5.0  5.0 - 8.0 Final  . Glucose, UA 09/15/2015 NEGATIVE  NEGATIVE mg/dL Final  . Hgb urine dipstick 09/15/2015 NEGATIVE  NEGATIVE Final  . Bilirubin Urine 09/15/2015 NEGATIVE  NEGATIVE Final  . Ketones, ur 09/15/2015 NEGATIVE  NEGATIVE mg/dL Final  . Protein, ur 09/15/2015 NEGATIVE  NEGATIVE mg/dL Final  . Nitrite 09/15/2015 NEGATIVE  NEGATIVE Final  . Leukocytes, UA 09/15/2015 MODERATE* NEGATIVE Final  . ABO/RH(D) 09/15/2015 A POS   Final  . Squamous Epithelial / LPF 09/15/2015 0-5* NONE SEEN Final  . WBC, UA 09/15/2015 6-30  0 - 5 WBC/hpf Final  . RBC / HPF 09/15/2015 0-5  0 -  5 RBC/hpf Final  . Bacteria, UA 09/15/2015 RARE* NONE SEEN Final  . Casts 09/15/2015 HYALINE CASTS* NEGATIVE Final  . Urine-Other 09/15/2015 MUCOUS PRESENT   Final     X-Rays:No results found.  EKG: Orders placed or performed during the hospital encounter of 09/15/15  . EKG 12 lead  . EKG 12 lead     Hospital Course: RAIDEN YEARWOOD is a 80 y.o. who  was admitted to [facility]. They were brought to the operating room on 09/25/2015 and underwent Procedure(s): TOTAL KNEE ARTHROPLASTY.  Patient tolerated the procedure well and was later transferred to the recovery room and then to the orthopaedic floor for postoperative care.  They were given PO and IV analgesics for pain control following their surgery.  They were given 24 hours of postoperative antibiotics of  Anti-infectives    Start     Dose/Rate Route Frequency Ordered Stop   09/25/15 1600  ceFAZolin (ANCEF) IVPB 2 g/50 mL premix     2 g 100 mL/hr over 30 Minutes Intravenous Every 6 hours 09/25/15 1307 09/25/15 2252   09/25/15 0717  ceFAZolin (ANCEF) IVPB 2 g/50 mL premix     2 g 100 mL/hr over 30 Minutes Intravenous On call to O.R. 09/25/15 6160 09/25/15 0955     and started on DVT prophylaxis in the form of Xarelto.   PT and OT were ordered for total joint protocol.  Discharge planning consulted to help with postop disposition and equipment needs.  Patient had a decent night on the evening of surgery.  They started to get up OOB with therapy on day one. Hemovac drain was pulled without difficulty.  Continued to work with therapy into day two.  Dressing was changed on day two and the incision was healing well. Patient was seen in rounds and was ready to go home later that day.   Discharge home with home health Diet - Cardiac diet Follow up - in 2 weeks Activity - WBAT Disposition - Home Condition Upon Discharge - Good D/C Meds - See DC Summary DVT Prophylaxis - Xarelto   Discharge Instructions    Call MD / Call  911    Complete by:  As directed   If you experience chest pain or shortness of breath, CALL 911 and be transported to the hospital emergency room.  If you develope a fever above 101 F, pus (white drainage) or increased drainage or redness at the wound, or calf pain, call your surgeon's office.     Change dressing    Complete by:  As directed   Change dressing daily with sterile 4 x 4 inch gauze dressing and apply TED hose. Do not submerge the incision under water.     Constipation Prevention    Complete by:  As directed   Drink plenty of fluids.  Prune juice may be helpful.  You may use a stool softener, such as Colace (over the counter) 100 mg twice a day.  Use MiraLax (over the counter) for constipation as needed.     Diet - low sodium heart healthy    Complete by:  As directed      Discharge instructions    Complete by:  As directed   Pick up stool softner and laxative for home use following surgery while on pain medications. Do not submerge incision under water. Please use good hand washing techniques while changing dressing each day. May shower starting three days after surgery. Please use a clean towel to pat the incision dry following showers. Continue to use ice for pain and swelling after surgery. Do not use any lotions or creams on the incision until instructed by your surgeon.  Take Xarelto for two and a half more weeks, then discontinue Xarelto. Once the patient has completed the Xarelto, they may resume the 81 mg Aspirin.  Postoperative Constipation Protocol  Constipation - defined medically as fewer than three stools per  week and severe constipation as less than one stool per week.  One of the most common issues patients have following surgery is constipation.  Even if you have a regular bowel pattern at home, your normal regimen is likely to be disrupted due to multiple reasons following surgery.  Combination of anesthesia, postoperative narcotics, change in appetite and  fluid intake all can affect your bowels.  In order to avoid complications following surgery, here are some recommendations in order to help you during your recovery period.  Colace (docusate) - Pick up an over-the-counter form of Colace or another stool softener and take twice a day as long as you are requiring postoperative pain medications.  Take with a full glass of water daily.  If you experience loose stools or diarrhea, hold the colace until you stool forms back up.  If your symptoms do not get better within 1 week or if they get worse, check with your doctor.  Dulcolax (bisacodyl) - Pick up over-the-counter and take as directed by the product packaging as needed to assist with the movement of your bowels.  Take with a full glass of water.  Use this product as needed if not relieved by Colace only.   MiraLax (polyethylene glycol) - Pick up over-the-counter to have on hand.  MiraLax is a solution that will increase the amount of water in your bowels to assist with bowel movements.  Take as directed and can mix with a glass of water, juice, soda, coffee, or tea.  Take if you go more than two days without a movement. Do not use MiraLax more than once per day. Call your doctor if you are still constipated or irregular after using this medication for 7 days in a row.  If you continue to have problems with postoperative constipation, please contact the office for further assistance and recommendations.  If you experience "the worst abdominal pain ever" or develop nausea or vomiting, please contact the office immediatly for further recommendations for treatment.     Do not put a pillow under the knee. Place it under the heel.    Complete by:  As directed      Do not sit on low chairs, stoools or toilet seats, as it may be difficult to get up from low surfaces    Complete by:  As directed      Driving restrictions    Complete by:  As directed   No driving until released by the physician.     Increase  activity slowly as tolerated    Complete by:  As directed      Lifting restrictions    Complete by:  As directed   No lifting until released by the physician.     Patient may shower    Complete by:  As directed   You may shower without a dressing once there is no drainage.  Do not wash over the wound.  If drainage remains, do not shower until drainage stops.     TED hose    Complete by:  As directed   Use stockings (TED hose) for 3 weeks on both leg(s).  You may remove them at night for sleeping.     Weight bearing as tolerated    Complete by:  As directed   Laterality:  right  Extremity:  Lower            Medication List    STOP taking these medications        aspirin  81 MG EC tablet     cholecalciferol 1000 units tablet  Commonly known as:  VITAMIN D     CoQ10 100 MG Caps     ESTRACE VA     ibuprofen 200 MG tablet  Commonly known as:  ADVIL,MOTRIN     OMEGA 3 PO     SM CRANBERRY 300 MG tablet  Generic drug:  Cranberry     VITAMIN B 12 PO      TAKE these medications        atorvastatin 20 MG tablet  Commonly known as:  LIPITOR  Take 20 mg by mouth 3 (three) times a week. MWF     docusate sodium 100 MG capsule  Commonly known as:  COLACE  Take 100 mg by mouth every morning.     furosemide 20 MG tablet  Commonly known as:  LASIX  Take 20 mg by mouth every morning.     methocarbamol 500 MG tablet  Commonly known as:  ROBAXIN  Take 1 tablet (500 mg total) by mouth every 6 (six) hours as needed for muscle spasms.     omeprazole 20 MG tablet  Commonly known as:  PRILOSEC OTC  Take 20 mg by mouth daily as needed (acid reflux).     oxyCODONE 5 MG immediate release tablet  Commonly known as:  Oxy IR/ROXICODONE  Take 1-2 tablets (5-10 mg total) by mouth every 3 (three) hours as needed for moderate pain or severe pain.     potassium chloride 10 MEQ tablet  Commonly known as:  K-DUR,KLOR-CON  Take 10 mEq by mouth every morning.     rivaroxaban 10 MG Tabs  tablet  Commonly known as:  XARELTO  Take 1 tablet (10 mg total) by mouth daily with breakfast. Take Xarelto for two and a half more weeks, then discontinue Xarelto. Once the patient has completed the Xarelto, they may resume the 81 mg Aspirin.     traMADol 50 MG tablet  Commonly known as:  ULTRAM  Take 1-2 tablets (50-100 mg total) by mouth every 6 (six) hours as needed (mild pain).           Follow-up Information    Follow up with Gearlean Alf, MD. Schedule an appointment as soon as possible for a visit in 2 weeks.   Specialty:  Orthopedic Surgery   Why:  Call office at [phone] to setup appointment on Tuesday 10/10/2015 with Dr. Denman George information:   [address] [place] [address] [phone]       Signed: Arlee Muslim, PA-C Orthopaedic Surgery 09/26/2015, 8:42 PM

## 2015-09-26 NOTE — Progress Notes (Signed)
Physical Therapy Treatment Patient Details Name: Linda Lee MRN: [id number] DOB: [birth year] Today's Date: 09/26/2015    History of Present Illness R TKA    PT Comments    POD # 1 pm session.   Assisted OOB to bathroom then amb in hallway a greater distance.  Practiced 2 steps then returned to room and assisted back to bed for CPM.  Pt progressing well and plans to D/C to home tomorrow.   Follow Up Recommendations  Home health PT;Supervision for mobility/OOB     Equipment Recommendations  None recommended by PT    Recommendations for Other Services       Precautions / Restrictions Precautions Precautions: Knee Precaution Comments: pt able to perform active SLR so D/C KI Restrictions Weight Bearing Restrictions: No RLE Weight Bearing: Weight bearing as tolerated    Mobility  Bed Mobility Overal bed mobility: Needs Assistance Bed Mobility: Supine to Sit;Sit to Supine     Supine to sit: Supervision Sit to supine: Supervision   General bed mobility comments: pt able to self rise with rail and assist own leg off/on bed  Transfers Overall transfer level: Needs assistance Equipment used: Rolling walker (2 wheeled) Transfers: Sit to/from Stand Sit to Stand: Supervision         General transfer comment: one VC on proper hand placement  Ambulation/Gait Ambulation/Gait assistance: Min guard;Supervision Ambulation Distance (Feet): 95 Feet Assistive device: Rolling walker (2 wheeled) Gait Pattern/deviations: Step-to pattern;Decreased stance time - right;Trunk flexed Gait velocity: decreased   General Gait Details: <25% VC's on proper sequencing and safety with turns   Stairs Stairs: Yes Stairs assistance: Min guard Stair Management: Two rails;Step to pattern;Forwards Number of Stairs: 2 General stair comments: 25% VC's on proper sequencing and safety   Wheelchair Mobility    Modified Rankin (Stroke Patients Only)       Balance                                     Cognition Arousal/Alertness: Awake/alert Behavior During Therapy: WFL for tasks assessed/performed Overall Cognitive Status: Within Functional Limits for tasks assessed                      Exercises      General Comments        Pertinent Vitals/Pain Pain Assessment: 0-10 Pain Score: 5  Pain Location: R knee Pain Descriptors / Indicators: Sore;Tender Pain Intervention(s): Monitored during session;Premedicated before session;Repositioned;Ice applied    Home Living                      Prior Function            PT Goals (current goals can now be found in the care plan section) Progress towards PT goals: Progressing toward goals    Frequency  7X/week    PT Plan      Co-evaluation             End of Session Equipment Utilized During Treatment: Gait belt Activity Tolerance: Patient tolerated treatment well Patient left: in bed;with call bell/phone within reach;with family/visitor present     Time: 7829-5621 PT Time Calculation (min) (ACUTE ONLY): 25 min  Charges:  1 gt    1 ta                    G Codes:  Rica Koyanagi  PTA [facility]  Acute  Rehab Pager      [phone]

## 2015-09-26 NOTE — Progress Notes (Signed)
RN entered patients room, she was walking in the room with the walker without staff assistance.  Educated patient and husband fall safety and her high risk for falls.  Patient and husband verbalized understanding.

## 2015-09-26 NOTE — Evaluation (Addendum)
Occupational Therapy Evaluation AND Discharge  Patient Details Name: Linda Lee MRN: [id number] DOB: 06-11-1936 Today's Date: 09/26/2015    History of Present Illness R TKA   Clinical Impression   Patient admitted with above. Patient independent PTA. Patient currently functioning at an overall supervision to min guard assist level.  No additional OT needs identified, D/C from acute OT services and no additional follow-up OT needs at this time. All appropriate education provided to patient and family (husband). Please re-order OT if needed.      Follow Up Recommendations  No OT follow up;Supervision - Intermittent    Equipment Recommendations  None recommended by OT    Recommendations for Other Services  None at this time  Precautions / Restrictions Precautions Precautions: Knee Precaution Comments: reveiwed no pillow under knee Restrictions Weight Bearing Restrictions: Yes RLE Weight Bearing: Weight bearing as tolerated    Mobility Bed Mobility Overal bed mobility: Needs Assistance Bed Mobility: Supine to Sit     Supine to sit: Supervision     General bed mobility comments: supervision for safety  Transfers Overall transfer level: Needs assistance Equipment used: Rolling walker (2 wheeled) Transfers: Sit to/from Stand Sit to Stand: Supervision General transfer comment: supervision for safety    Balance Overall balance assessment: Needs assistance Sitting-balance support: No upper extremity supported;Feet supported Sitting balance-Leahy Scale: Good     Standing balance support: No upper extremity supported;During functional activity Standing balance-Leahy Scale: Good Standing balance comment: Pt able to stand at sink for ADL without UE support     ADL Overall ADL's : Needs assistance/impaired General ADL Comments: Pt overall supervision to min guard assist for ADLs Pt stood for UB/LB bathing and dressing & grooming tasks standing at sink. Pt's husband  will be present to assist with ADLs and IADLs post acute d/c. Pt practiced walk-in shower transfer using RW, stepping frontward, as pt states she has grab bars in and outside of shower.     Pertinent Vitals/Pain Pain Assessment: 0-10 Pain Score: 5  Pain Location: right knee with activity  Pain Descriptors / Indicators: Aching;Sore Pain Intervention(s): Monitored during session;Repositioned;Ice applied     Hand Dominance Right   Extremity/Trunk Assessment Upper Extremity Assessment Upper Extremity Assessment: Overall WFL for tasks assessed   Lower Extremity Assessment Lower Extremity Assessment: Defer to PT evaluation   Cervical / Trunk Assessment Cervical / Trunk Assessment: Normal   Communication Communication Communication: No difficulties   Cognition Arousal/Alertness: Awake/alert Behavior During Therapy: WFL for tasks assessed/performed Overall Cognitive Status: Within Functional Limits for tasks assessed              Home Living Family/patient expects to be discharged to:: Private residence Living Arrangements: Spouse/significant other Available Help at Discharge: Family;Available 24 hours/day Type of Home: House Home Access: Ramped entrance     Home Layout: Able to live on main level with bedroom/bathroom;Two level     Bathroom Shower/Tub: Walk-in shower;Door   Bathroom Toilet: Handicapped height     Home Equipment: Environmental consultant - 2 wheels;Walker - 4 wheels;Shower seat;Bedside commode;Grab bars - toilet;Grab bars - tub/shower;Hand held shower head    Prior Functioning/Environment Level of Independence: Independent with assistive device(s)  Comments: used RW for a few days PTA due to recently twisting knee    OT Diagnosis: Generalized weakness;Acute pain   OT Problem List:   N/a, no acute OT needs identified at this time     OT Treatment/Interventions:   N/a, no acute OT needs identified at this time  OT Goals(Current goals can be found in the care plan  section) Acute Rehab OT Goals Patient Stated Goal: go home tomorrow OT Goal Formulation: All assessment and education complete, DC therapy  OT Frequency:   N/a, no acute OT needs identified at this time     Barriers to D/C:  none known at this time    End of Session Equipment Utilized During Treatment: Rolling walker CPM Right Knee CPM Right Knee: Off  Activity Tolerance: Patient tolerated treatment well Patient left: in bed;with call bell/phone within reach;with family/visitor present   Time: 2956-2130 OT Time Calculation (min): 26 min Charges:  OT General Charges $OT Visit: 1 Procedure OT Evaluation $OT Eval Low Complexity: 1 Procedure OT Treatments $Self Care/Home Management : 8-22 mins  Edwin Cap , MS, OTR/L, CLT Pager: [phone]  09/26/2015, 11:26 AM

## 2015-09-26 NOTE — Discharge Instructions (Addendum)
° °Dr. Frank Aluisio °Total Joint Specialist °[facility] °[address] °[place], [place] [address] °[phone] ° °TOTAL KNEE REPLACEMENT POSTOPERATIVE DIRECTIONS ° °Knee Rehabilitation, Guidelines Following Surgery  °Results after knee surgery are often greatly improved when you follow the exercise, range of motion and muscle strengthening exercises prescribed by your doctor. Safety measures are also important to protect the knee from further injury. Any time any of these exercises cause you to have increased pain or swelling in your knee joint, decrease the amount until you are comfortable again and slowly increase them. If you have problems or questions, call your caregiver or physical therapist for advice.  ° °HOME CARE INSTRUCTIONS  °Remove items at home which could result in a fall. This includes throw rugs or furniture in walking pathways.  °· ICE to the affected knee every three hours for 30 minutes at a time and then as needed for pain and swelling.  Continue to use ice on the knee for pain and swelling from surgery. You may notice swelling that will progress down to the foot and ankle.  This is normal after surgery.  Elevate the leg when you are not up walking on it.   °· Continue to use the breathing machine which will help keep your temperature down.  It is common for your temperature to cycle up and down following surgery, especially at night when you are not up moving around and exerting yourself.  The breathing machine keeps your lungs expanded and your temperature down. °· Do not place pillow under knee, focus on keeping the knee straight while resting ° °DIET °You may resume your previous home diet once your are discharged from the hospital. ° °DRESSING / WOUND CARE / SHOWERING °You may shower 3 days after surgery, but keep the wounds dry during showering.  You may use an occlusive plastic wrap (Press'n Seal for example), NO SOAKING/SUBMERGING IN THE BATHTUB.  If the  bandage gets wet, change with a clean dry gauze.  If the incision gets wet, pat the wound dry with a clean towel. °You may start showering once you are discharged home but do not submerge the incision under water. Just pat the incision dry and apply a dry gauze dressing on daily. °Change the surgical dressing daily and reapply a dry dressing each time. ° °ACTIVITY °Walk with your walker as instructed. °Use walker as long as suggested by your caregivers. °Avoid periods of inactivity such as sitting longer than an hour when not asleep. This helps prevent blood clots.  °You may resume a sexual relationship in one month or when given the OK by your doctor.  °You may return to work once you are cleared by your doctor.  °Do not drive a car for 6 weeks or until released by you surgeon.  °Do not drive while taking narcotics. ° °WEIGHT BEARING °Weight bearing as tolerated with assist device (walker, cane, etc) as directed, use it as long as suggested by your surgeon or therapist, typically at least 4-6 weeks. ° °POSTOPERATIVE CONSTIPATION PROTOCOL °Constipation - defined medically as fewer than three stools per week and severe constipation as less than one stool per week. ° °One of the most common issues patients have following surgery is constipation.  Even if you have a regular bowel pattern at home, your normal regimen is likely to be disrupted due to multiple reasons following surgery.  Combination of anesthesia, postoperative narcotics, change in appetite and fluid intake all can affect your bowels.    In order to avoid complications following surgery, here are some recommendations in order to help you during your recovery period. ° °Colace (docusate) - Pick up an over-the-counter form of Colace or another stool softener and take twice a day as long as you are requiring postoperative pain medications.  Take with a full glass of water daily.  If you experience loose stools or diarrhea, hold the colace until you stool forms  back up.  If your symptoms do not get better within 1 week or if they get worse, check with your doctor. ° °Dulcolax (bisacodyl) - Pick up over-the-counter and take as directed by the product packaging as needed to assist with the movement of your bowels.  Take with a full glass of water.  Use this product as needed if not relieved by Colace only.  ° °MiraLax (polyethylene glycol) - Pick up over-the-counter to have on hand.  MiraLax is a solution that will increase the amount of water in your bowels to assist with bowel movements.  Take as directed and can mix with a glass of water, juice, soda, coffee, or tea.  Take if you go more than two days without a movement. °Do not use MiraLax more than once per day. Call your doctor if you are still constipated or irregular after using this medication for 7 days in a row. ° °If you continue to have problems with postoperative constipation, please contact the office for further assistance and recommendations.  If you experience "the worst abdominal pain ever" or develop nausea or vomiting, please contact the office immediatly for further recommendations for treatment. ° °ITCHING ° If you experience itching with your medications, try taking only a single pain pill, or even half a pain pill at a time.  You can also use Benadryl over the counter for itching or also to help with sleep.  ° °TED HOSE STOCKINGS °Wear the elastic stockings on both legs for three weeks following surgery during the day but you may remove then at night for sleeping. ° °MEDICATIONS °See your medication summary on the “After Visit Summary” that the nursing staff will review with you prior to discharge.  You may have some home medications which will be placed on hold until you complete the course of blood thinner medication.  It is important for you to complete the blood thinner medication as prescribed by your surgeon.  Continue your approved medications as instructed at time of  discharge. ° °PRECAUTIONS °If you experience chest pain or shortness of breath - call 911 immediately for transfer to the hospital emergency department.  °If you develop a fever greater that 101 F, purulent drainage from wound, increased redness or drainage from wound, foul odor from the wound/dressing, or calf pain - CONTACT YOUR SURGEON.   °                                                °FOLLOW-UP APPOINTMENTS °Make sure you keep all of your appointments after your operation with your surgeon and caregivers. You should call the office at the above phone number and make an appointment for approximately two weeks after the date of your surgery or on the date instructed by your surgeon outlined in the "After Visit Summary". ° ° °RANGE OF MOTION AND STRENGTHENING EXERCISES  °Rehabilitation of the knee is important following a knee injury or   an operation. After just a few days of immobilization, the muscles of the thigh which control the knee become weakened and shrink (atrophy). Knee exercises are designed to build up the tone and strength of the thigh muscles and to improve knee motion. Often times heat used for twenty to thirty minutes before working out will loosen up your tissues and help with improving the range of motion but do not use heat for the first two weeks following surgery. These exercises can be done on a training (exercise) mat, on the floor, on a table or on a bed. Use what ever works the best and is most comfortable for you Knee exercises include:  °Leg Lifts - While your knee is still immobilized in a splint or cast, you can do straight leg raises. Lift the leg to 60 degrees, hold for 3 sec, and slowly lower the leg. Repeat 10-20 times 2-3 times daily. Perform this exercise against resistance later as your knee gets better.  °Quad and Hamstring Sets - Tighten up the muscle on the front of the thigh (Quad) and hold for 5-10 sec. Repeat this 10-20 times hourly. Hamstring sets are done by pushing the  foot backward against an object and holding for 5-10 sec. Repeat as with quad sets.  °· Leg Slides: Lying on your back, slowly slide your foot toward your buttocks, bending your knee up off the floor (only go as far as is comfortable). Then slowly slide your foot back down until your leg is flat on the floor again. °· Angel Wings: Lying on your back spread your legs to the side as far apart as you can without causing discomfort.  °A rehabilitation program following serious knee injuries can speed recovery and prevent re-injury in the future due to weakened muscles. Contact your doctor or a physical therapist for more information on knee rehabilitation.  ° °IF YOU ARE TRANSFERRED TO A SKILLED REHAB FACILITY °If the patient is transferred to a skilled rehab facility following release from the hospital, a list of the current medications will be sent to the facility for the patient to continue.  When discharged from the skilled rehab facility, please have the facility set up the patient's Home Health Physical Therapy prior to being released. Also, the skilled facility will be responsible for providing the patient with their medications at time of release from the facility to include their pain medication, the muscle relaxants, and their blood thinner medication. If the patient is still at the rehab facility at time of the two week follow up appointment, the skilled rehab facility will also need to assist the patient in arranging follow up appointment in our office and any transportation needs. ° °MAKE SURE YOU:  °Understand these instructions.  °Get help right away if you are not doing well or get worse.  ° ° °Pick up stool softner and laxative for home use following surgery while on pain medications. °Do not submerge incision under water. °Please use good hand washing techniques while changing dressing each day. °May shower starting three days after surgery. °Please use a clean towel to pat the incision dry following  showers. °Continue to use ice for pain and swelling after surgery. °Do not use any lotions or creams on the incision until instructed by your surgeon. ° °Take Xarelto for two and a half more weeks, then discontinue Xarelto. °Once the patient has completed the Xarelto, they may resume the 81 mg Aspirin. ° ° °Information on my medicine - XARELTO® (Rivaroxaban) ° °  This medication education was reviewed with me or my healthcare representative as part of my discharge preparation.  The pharmacist that spoke with me during my hospital stay was:  Jackson, Rachel E, RPH ° °Why was Xarelto® prescribed for you? °Xarelto® was prescribed for you to reduce the risk of blood clots forming after orthopedic surgery. The medical term for these abnormal blood clots is venous thromboembolism (VTE). ° °What do you need to know about xarelto® ? °Take your Xarelto® ONCE DAILY at the same time every day. °You may take it either with or without food. ° °If you have difficulty swallowing the tablet whole, you may crush it and mix in applesauce just prior to taking your dose. ° °Take Xarelto® exactly as prescribed by your doctor and DO NOT stop taking Xarelto® without talking to the doctor who prescribed the medication.  Stopping without other VTE prevention medication to take the place of Xarelto® may increase your risk of developing a clot. ° °After discharge, you should have regular check-up appointments with your healthcare provider that is prescribing your Xarelto®.   ° °What do you do if you miss a dose? °If you miss a dose, take it as soon as you remember on the same day then continue your regularly scheduled once daily regimen the next day. Do not take two doses of Xarelto® on the same day.  ° °Important Safety Information °A possible side effect of Xarelto® is bleeding. You should call your healthcare provider right away if you experience any of the following: °? Bleeding from an injury or your nose that does not stop. °? Unusual  colored urine (red or dark brown) or unusual colored stools (red or black). °? Unusual bruising for unknown reasons. °? A serious fall or if you hit your head (even if there is no bleeding). ° °Some medicines may interact with Xarelto® and might increase your risk of bleeding while on Xarelto®. To help avoid this, consult your healthcare provider or pharmacist prior to using any new prescription or non-prescription medications, including herbals, vitamins, non-steroidal anti-inflammatory drugs (NSAIDs) and supplements. ° °This website has more information on Xarelto®: www.xarelto.com. ° ° °

## 2015-09-26 NOTE — Progress Notes (Signed)
   Subjective: 1 Day Post-Op Procedure(s) (LRB): TOTAL KNEE ARTHROPLASTY (Right) Patient reports pain as mild.   Patient seen in rounds with Dr. Lequita Halt.  Sitting up in bed.  Husband at bedside. Patient is well, but has had some minor complaints of pain in the knee, requiring pain medications.  Thigh sore this morning. We will resume therapy today.  She walked about 3 feet yesterday following surgery.  Plan is to go Home after hospital stay.  Objective: Vital signs in last 24 hours: Temp:  [97.4 F (36.3 C)-97.8 F (36.6 C)] 97.8 F (36.6 C) (02/21 0600) Pulse Rate:  [50-68] 60 (02/21 0600) Resp:  [14-20] 18 (02/21 0600) BP: (101-131)/(42-72) 102/65 mmHg (02/21 0600) SpO2:  [94 %-100 %] 100 % (02/21 0600)  Intake/Output from previous day:  Intake/Output Summary (Last 24 hours) at 09/26/15 0753 Last data filed at 09/26/15 0200  Gross per 24 hour  Intake 3136.25 ml  Output   1115 ml  Net 2021.25 ml    Labs:  Recent Labs  09/26/15 0416  HGB 10.5*    Recent Labs  09/26/15 0416  WBC 7.6  RBC 3.35*  HCT 32.2*  PLT 126*    Recent Labs  09/26/15 0416  NA 142  K 4.4  CL 110  CO2 26  BUN 14  CREATININE 0.85  GLUCOSE 116*  CALCIUM 8.6*   No results for input(s): LABPT, INR in the last 72 hours.  EXAM General - Patient is Alert, Appropriate and Oriented Extremity - Neurovascular intact Sensation intact distally Dorsiflexion/Plantar flexion intact Dressing - dressing C/D/I Motor Function - intact, moving foot and toes well on exam.  Hemovac pulled without difficulty.  Past Medical History  Diagnosis Date  . Anginal pain (HCC)   . Hyperlipemia   . Migraines   . GERD (gastroesophageal reflux disease)   . Arthritis     Assessment/Plan: 1 Day Post-Op Procedure(s) (LRB): TOTAL KNEE ARTHROPLASTY (Right) Principal Problem:   OA (osteoarthritis) of knee  Estimated body mass index is 26.21 kg/(m^2) as calculated from the following:   Height as of this  encounter: 5' 5.5" (1.664 m).   Weight as of this encounter: 72.576 kg (160 lb). Advance diet Up with therapy Plan for discharge tomorrow Discharge home with home health  DVT Prophylaxis - Xarelto Weight-Bearing as tolerated to right leg D/C O2 and Pulse OX and try on Room Air  Avel Peace, PA-C Orthopaedic Surgery 09/26/2015, 7:53 AM

## 2015-09-26 NOTE — Progress Notes (Signed)
Physical Therapy Treatment Patient Details Name: Linda Lee MRN: [id number] DOB: September 05, 1935 Today's Date: 09/26/2015    History of Present Illness R TKA    PT Comments    POD # 1 am session.  Pt able to perform active SLR so D/C KI.  Assisted OOB to amb in hallway then back to bed to perform TKR TE's followed by ICE.    Follow Up Recommendations  Home health PT;Supervision for mobility/OOB     Equipment Recommendations  None recommended by PT    Recommendations for Other Services       Precautions / Restrictions Precautions Precautions: Knee Precaution Comments: pt able to perform active SLR so D/C KI Restrictions Weight Bearing Restrictions: No RLE Weight Bearing: Weight bearing as tolerated    Mobility  Bed Mobility Overal bed mobility: Needs Assistance Bed Mobility: Supine to Sit;Sit to Supine     Supine to sit: Supervision Sit to supine: Supervision   General bed mobility comments: pt able to self rise with rail and assist own leg off/on bed  Transfers Overall transfer level: Needs assistance Equipment used: Rolling walker (2 wheeled) Transfers: Sit to/from Stand Sit to Stand: Supervision         General transfer comment: one VC on proper hand placement  Ambulation/Gait Ambulation/Gait assistance: Min guard;Supervision Ambulation Distance (Feet): 75 Feet Assistive device: Rolling walker (2 wheeled) Gait Pattern/deviations: Step-to pattern;Decreased stance time - right;Trunk flexed Gait velocity: decreased   General Gait Details: <25% VC's on proper sequencing and safety with turns   Information systems manager Rankin (Stroke Patients Only)       Balance                                    Cognition Arousal/Alertness: Awake/alert Behavior During Therapy: WFL for tasks assessed/performed Overall Cognitive Status: Within Functional Limits for tasks assessed                       Exercises      General Comments        Pertinent Vitals/Pain Pain Assessment: 0-10 Pain Score: 5  Pain Location: R knee Pain Descriptors / Indicators: Sore;Tender Pain Intervention(s): Monitored during session;Premedicated before session;Repositioned;Ice applied    Home Living                      Prior Function            PT Goals (current goals can now be found in the care plan section) Progress towards PT goals: Progressing toward goals    Frequency  7X/week    PT Plan      Co-evaluation             End of Session Equipment Utilized During Treatment: Gait belt Activity Tolerance: Patient tolerated treatment well Patient left: in bed;with call bell/phone within reach;with family/visitor present     Time: 0925-0950 PT Time Calculation (min) (ACUTE ONLY): 25 min  Charges:  $Gait Training: 8-22 mins $Therapeutic Exercise: 8-22 mins                    G Codes:      Felecia Shelling  PTA [facility]  Acute  Rehab Pager      [phone]

## 2015-09-27 LAB — CBC
HCT: 30.8 % — ABNORMAL LOW (ref 36.0–46.0)
Hemoglobin: 10 g/dL — ABNORMAL LOW (ref 12.0–15.0)
MCH: 31.3 pg (ref 26.0–34.0)
MCHC: 32.5 g/dL (ref 30.0–36.0)
MCV: 96.3 fL (ref 78.0–100.0)
Platelets: 132 10*3/uL — ABNORMAL LOW (ref 150–400)
RBC: 3.2 MIL/uL — ABNORMAL LOW (ref 3.87–5.11)
RDW: 13.3 % (ref 11.5–15.5)
WBC: 8.5 10*3/uL (ref 4.0–10.5)

## 2015-09-27 LAB — BASIC METABOLIC PANEL
Anion gap: 7 (ref 5–15)
BUN: 18 mg/dL (ref 6–20)
CO2: 26 mmol/L (ref 22–32)
Calcium: 8.3 mg/dL — ABNORMAL LOW (ref 8.9–10.3)
Chloride: 111 mmol/L (ref 101–111)
Creatinine, Ser: 0.73 mg/dL (ref 0.44–1.00)
GFR calc Af Amer: >60 mL/min (ref >60)
GFR calc non Af Amer: >60 mL/min (ref >60)
Glucose, Bld: 127 mg/dL — ABNORMAL HIGH (ref 65–99)
Potassium: 3.8 mmol/L (ref 3.5–5.1)
Sodium: 144 mmol/L (ref 135–145)

## 2015-09-27 NOTE — Progress Notes (Signed)
   Subjective: 2 Days Post-Op Procedure(s) (LRB): TOTAL KNEE ARTHROPLASTY (Right) Patient reports pain as mild.   Patient seen in rounds for Dr. Lequita Halt. Patient is well, but has had some minor complaints of pain in the knee, requiring pain medications Patient is ready to go home today following therapy  Objective: Vital signs in last 24 hours: Temp:  [97.8 F (36.6 C)-98.5 F (36.9 C)] 98.5 F (36.9 C) (02/22 0500) Pulse Rate:  [56-68] 68 (02/22 0500) Resp:  [16] 16 (02/22 0500) BP: (103-123)/(53-63) 117/53 mmHg (02/22 0500) SpO2:  [93 %-96 %] 96 % (02/22 0500)  Intake/Output from previous day:  Intake/Output Summary (Last 24 hours) at 09/27/15 1029 Last data filed at 09/27/15 0913  Gross per 24 hour  Intake    960 ml  Output      0 ml  Net    960 ml    Intake/Output this shift: Total I/O In: 240 [P.O.:240] Out: -   Labs:  Recent Labs  09/26/15 0416 09/27/15 0424  HGB 10.5* 10.0*    Recent Labs  09/26/15 0416 09/27/15 0424  WBC 7.6 8.5  RBC 3.35* 3.20*  HCT 32.2* 30.8*  PLT 126* 132*    Recent Labs  09/26/15 0416 09/27/15 0424  NA 142 144  K 4.4 3.8  CL 110 111  CO2 26 26  BUN 14 18  CREATININE 0.85 0.73  GLUCOSE 116* 127*  CALCIUM 8.6* 8.3*   No results for input(s): LABPT, INR in the last 72 hours.  EXAM: General - Patient is Alert, Appropriate and Oriented Extremity - Neurovascular intact Sensation intact distally Dorsiflexion/Plantar flexion intact Incision - clean, dry, no drainage Motor Function - intact, moving foot and toes well on exam.   Assessment/Plan: 2 Days Post-Op Procedure(s) (LRB): TOTAL KNEE ARTHROPLASTY (Right) Procedure(s) (LRB): TOTAL KNEE ARTHROPLASTY (Right) Past Medical History  Diagnosis Date  . Anginal pain (HCC)   . Hyperlipemia   . Migraines   . GERD (gastroesophageal reflux disease)   . Arthritis    Principal Problem:   OA (osteoarthritis) of knee  Estimated body mass index is 26.21 kg/(m^2) as  calculated from the following:   Height as of this encounter: 5' 5.5" (1.664 m).   Weight as of this encounter: 72.576 kg (160 lb). Up with therapy Discharge home with home health Diet - Cardiac diet Follow up - in 2 weeks Activity - WBAT Disposition - Home Condition Upon Discharge - Good D/C Meds - See DC Summary DVT Prophylaxis - Xarelto  Avel Peace, PA-C Orthopaedic Surgery 09/27/2015, 10:29 AM

## 2015-09-27 NOTE — Care Management Note (Signed)
Case Management Note  Patient Details  Name: Linda Lee MRN: [id number] Date of Birth: Nov 03, 1935  Subjective/Objective:                   Action/Plan:  Discharge planning Expected Discharge Date:  09/27/15               Expected Discharge Plan:  [place]  In-House Referral:     Discharge planning Services  CM Consult  Post Acute Care Choice:  Home Health Choice offered to:  Patient  DME Arranged:  3-N-1 DME Agency:  [place]:  PT [facility] Agency:  [place]  Status of Service:  Completed, signed off  Medicare Important Message Given:    Date Medicare IM Given:    Medicare IM give by:    Date Additional Medicare IM Given:    Additional Medicare Important Message give by:     If discussed at [employer] of Stay Meetings, dates discussed:    Additional Comments: CM met with pt in room to offer choice of home health agency.  Pt chooses [facility] to render HHPT.  Referral with request for [facility]  given to [place] rep, Tim (on unit). Pt has all DME from previous surgery.  No other CM needs were communicated.  Dellie Catholic, RN 09/27/2015, 9:16 AM

## 2015-09-27 NOTE — Progress Notes (Signed)
Physical Therapy Treatment Patient Details Name: Linda Lee MRN: [id number] DOB: 1936-04-16 Today's Date: 09/27/2015    History of Present Illness R TKA    PT Comments    POD # 2 Assisted with amb a greater distance in hallway.  Performed all supine TKR TE's following HEP handout.  Applied ICE.     Follow Up Recommendations  Home health PT;Supervision for mobility/OOB     Equipment Recommendations  None recommended by PT    Recommendations for Other Services       Precautions / Restrictions Precautions Precautions: Knee Precaution Comments: pt able to perform active SLR so D/C KI Restrictions Weight Bearing Restrictions: No RLE Weight Bearing: Weight bearing as tolerated    Mobility  Bed Mobility Overal bed mobility: Modified Independent             General bed mobility comments: able to self rise and get own leg off/on bed  Transfers Overall transfer level: Needs assistance Equipment used: Rolling walker (2 wheeled) Transfers: Sit to/from Stand Sit to Stand: Modified independent (Device/Increase time)         General transfer comment: good safety cognition and use of hands to steady self  Ambulation/Gait Ambulation/Gait assistance: Supervision Ambulation Distance (Feet): 115 Feet Assistive device: Rolling walker (2 wheeled) Gait Pattern/deviations: Step-to pattern;Step-through pattern Gait velocity: decreased   General Gait Details: good aletrnating gait and one VC safety with backward gait   Stairs Stairs:  (has a ramp because of spouse)          Wheelchair Mobility    Modified Rankin (Stroke Patients Only)       Balance                                    Cognition Arousal/Alertness: Awake/alert Behavior During Therapy: WFL for tasks assessed/performed Overall Cognitive Status: Within Functional Limits for tasks assessed                      Exercises      General Comments        Pertinent  Vitals/Pain Pain Assessment: 0-10 Pain Score: 3  Pain Location: R knee Pain Descriptors / Indicators: Sore;Tender Pain Intervention(s): Monitored during session;Premedicated before session;Repositioned;Ice applied    Home Living                      Prior Function            PT Goals (current goals can now be found in the care plan section) Progress towards PT goals: Progressing toward goals    Frequency  7X/week    PT Plan      Co-evaluation             End of Session Equipment Utilized During Treatment: Gait belt Activity Tolerance: Patient tolerated treatment well Patient left: in bed;with call bell/phone within reach;with family/visitor present     Time: 1005-1030 PT Time Calculation (min) (ACUTE ONLY): 25 min  Charges:  $Gait Training: 8-22 mins $Therapeutic Exercise: 8-22 mins                    G Codes:      Felecia Shelling  PTA [facility]  Acute  Rehab Pager      [phone]

## 2015-09-27 NOTE — Progress Notes (Signed)
RN entered patients room as patient was returning from the bathroom without calling for staff assistance.  Patient and husband again educated on patient fall risk and need for assistance when out of bed.  Patient does not want the bed alarm turned on.

## 2016-01-03 ENCOUNTER — Other Ambulatory Visit: Payer: Self-pay | Admitting: Internal Medicine

## 2016-01-03 DIAGNOSIS — I77810 Thoracic aortic ectasia: Secondary | ICD-10-CM

## 2016-01-04 ENCOUNTER — Ambulatory Visit
Admission: RE | Admit: 2016-01-04 | Discharge: 2016-01-04 | Disposition: A | Discharge status: Home / Self Care | Payer: Medicare Other | Source: Ambulatory Visit | Attending: Internal Medicine | Admitting: Internal Medicine

## 2016-01-04 DIAGNOSIS — I77810 Thoracic aortic ectasia: Secondary | ICD-10-CM

## 2016-01-04 MED ORDER — IOPAMIDOL (ISOVUE-370) INJECTION 76%
75.0000 mL | Freq: Once | INTRAVENOUS | Status: AC | PRN
  Administered 2016-01-04: 75 mL via INTRAVENOUS

## 2016-01-17 ENCOUNTER — Institutional Professional Consult (permissible substitution) (INDEPENDENT_AMBULATORY_CARE_PROVIDER_SITE_OTHER): Payer: Medicare Other | Admitting: Cardiothoracic Surgery

## 2016-01-17 ENCOUNTER — Encounter: Payer: Self-pay | Admitting: Cardiothoracic Surgery

## 2016-01-17 VITALS — BP 142/76 | HR 81 | Resp 16 | Ht 64.5 in | Wt 152.0 lb

## 2016-01-17 DIAGNOSIS — I712 Thoracic aortic aneurysm, without rupture, unspecified: Secondary | ICD-10-CM

## 2016-01-17 NOTE — Progress Notes (Signed)
PCP is Ezequiel Kayser, MD Referring Provider is Rodrigo Ran, MD  Chief Complaint  Patient presents with  . 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 This is asymptomatic. 4 years ago the patient had a CT scan of the chest which showed the ascending aorta to measure 4.1 cm in diameter. 2 years ago the patient had echocardiogram which showed trivial aortic insufficiency, normal LV function and a probable bicuspid aortic valve. The patient denies symptoms of angina or CHF. She does have some mild dyspnea on exertion when she climbs stairs at home.  The patient denies family history of thoracic or bowel aneurysm disease. She denies history of aortic dissection. She's never had hypertension. Lung windows on the CT scan showed evidence of pulmonary nodule or abnormal mediastinal adenopathy  Past Medical History  Diagnosis Date  . Anginal pain (HCC)   . Hyperlipemia   . Migraines   . GERD (gastroesophageal reflux disease)   . Arthritis     Past Surgical History  Procedure Laterality Date  . Tonsillectomy    . Hemorrhoid surgery    . Back surgery    . 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 Total knee arthroplasty Right 09/25/2015    Procedure: TOTAL KNEE ARTHROPLASTY;  Surgeon: Ollen Gross, MD;  Location: [facility];  Service: Orthopedics;  Laterality: Right;    No family history on file.  Social History Social History  Substance Use Topics  . Smoking status: Former Smoker    Quit date: 08/15/1963  . Smokeless tobacco: Never Used  . Alcohol Use: 0.6 oz/week    1 Glasses of wine per week     Comment: almost every night    Current Outpatient Prescriptions  Medication Sig Dispense Refill  . aspirin EC 325 MG tablet Take 325 mg by  mouth daily.    Marland Kitchen atorvastatin (LIPITOR) 20 MG tablet Take 20 mg by mouth 3 (three) times a week. MWF    . CALCIUM PO Take 1,000 mg by mouth daily.    . Coenzyme Q10 300 MG CAPS Take 1 capsule by mouth. Monday, Wednesday, Friday    . CRANBERRY PO Take 300 mg by mouth daily.    . Cyanocobalamin (VITAMIN B-12 PO) Take 2,500 mg by mouth daily.    Marland Kitchen docusate sodium (COLACE) 100 MG capsule Take 100 mg by mouth every morning.     . Ergocalciferol (VITAMIN D2) 2000 units TABS Take 1 tablet by mouth daily.    Marland Kitchen estradiol (ESTRACE) 0.1 MG/GM vaginal cream Place 1 Applicatorful vaginally once a week.    . Ferrous Sulfate (IRON SUPPLEMENT PO) Take 25 mg by mouth daily.    . furosemide (LASIX) 20 MG tablet Take 20 mg by mouth every morning.     . Omega-3 300 MG CAPS Take 1 capsule by mouth daily.    Marland Kitchen omeprazole (PRILOSEC OTC) 20 MG tablet Take 20 mg by mouth daily as needed (acid reflux).    . potassium chloride (K-DUR,KLOR-CON) 10 MEQ tablet Take 10 mEq by mouth every morning.      No current facility-administered medications for this visit.    Allergies  Allergen Reactions  . Fluconazole Rash    Review of Systems       The patient is right-hand  dominant      The patient denies history of thoracic trauma, pneumothorax, rib fractures  Review of Systems :  [ y ] = yes, [  ] = no        General :  Weight gain [   ]    Weight loss  [   ]  Fatigue [  ]  Fever [  ]  Chills  [  ]                                Weakness  [  ]           HEENT    Headache [  Yes migraine]  Dizziness [  ]  Blurred vision [  ] Glaucoma  [  ]                          Nosebleeds [  ] Painful or loose teeth [  ]        Cardiac :  Chest pain/ pressure [  ]  Resting SOB [  ] exertional SOB [ yes mild ]                        Orthopnea [  ]  Pedal edema  [  ]  Palpitations [  ] Syncope/presyncope                         Paroxysmal nocturnal dyspnea [  ]         Pulmonary : cough [  ]  wheezing [  ]  Hemoptysis [  ]  Sputum [  ] Snoring [  ]                              Pneumothorax [  ]  Sleep apnea [  ]        GI : Vomiting [  ]  Dysphagia [  ]  Melena  [  ]  Abdominal pain [  ] BRBPR [  ]              Heart burn [  ]  Constipation [  ] Diarrhea  [  ] Colonoscopy [   ]        GU : Hematuria [  ]  Dysuria [  ]  Nocturia [  ] UTI's [  ]        Vascular : Claudication [  ]  Rest pain [  ]  DVT [  ] Vein stripping [  ] leg ulcers [  ]                          TIA [  ] Stroke [  ]  Varicose veins [  ]        NEURO :  Headaches  [  ] Seizures [  ] Vision changes [  ] Paresthesias [  ]                                       Seizures [  ]        Musculoskeletal :  Arthritis [  yes status post right total knee replacement February 2017  ] Gout  [  ]  Back pain [  ]  Joint pain [  ]        Skin :  Rash [  ]  Melanoma [  ] Sores [  ]        Heme : Bleeding problems [  ]Clotting Disorders [  ] Anemia [  ]Blood Transfusion [ ]         Endocrine : Diabetes [  ] Heat or Cold intolerance [  ] Polyuria [  ]excessive thirst [ ]         Psych : Depression [  ]  Anxiety [  ]  Psych hospitalizations [  ] Memory change [  ]                                               BP 142/76 mmHg  Pulse 81  Resp 16  Ht 5' 4.5" (1.638 m)  Wt 152 lb (68.947 kg)  BMI 25.70 kg/m2  SpO2 98% Physical Exam       Physical Exam  General: pleasant well-nourished elderly Caucasian female no acute distress accompanied by her husband HEENT: Normocephalic pupils equal , dentition adequate Neck: Supple without JVD, adenopathy, or bruit Chest: Clear to auscultation, symmetrical breath sounds, no rhonchi, no tenderness             or deformity Cardiovascular: Regular rate and rhythm, no murmur, no gallop, peripheral pulses             palpable in all extremities Abdomen:  Soft, nontender, no palpable mass or organomegaly Extremities: Warm, well-perfused, no clubbing cyanosis, mild pedal edema  without tendernessbilaterally.               no venous stasis changes of the legs Rectal/GU: Deferred Neuro: Grossly non--focal and symmetrical throughout Skin: Clean and dry without rash or ulceration   Diagnostic Tests: CTS thoracic aorta shows a fusiform ascending aneurysm 4.4 cm in diameter. There is no mural hematoma, ulceration or edema. The aortic arch and descending thoracic aorta are of normal size. CT scan 4 years ago shows the ascending aorta to be 4.1 cm in diameter.  Impression: The patient has a moderate fusiform ascending aneurysm which has demonstrated a 3 mm increase in size over 4 years. This is not concerning.  Surgical intervention son indicated until the transverse diameter is 5.5 cm or if the aneurysm shows rapid growth or becomes symptomatic.  Best long-term therapy at this point is close monitoring of blood pressure and to initiate antihypertensive medication if she shows evidence of hypertension. Would recommend losartan 25 mg daily as a starting dose.  Plan:we will set the patient for surveillance serial CT scans of the chest to monitor aortic aneurysm size. She will return in 6 months with CTA and then an annual scan to follow. She will report any new symptoms of chest or back pain medially. She understands she can carry on her normal level activities and lifestyle.   Mikey BussingPeter Van Trigt III, MD [facility] [phone]

## 2016-06-14 ENCOUNTER — Other Ambulatory Visit: Payer: Self-pay | Admitting: *Deleted

## 2016-06-14 DIAGNOSIS — I712 Thoracic aortic aneurysm, without rupture, unspecified: Secondary | ICD-10-CM

## 2016-07-24 ENCOUNTER — Other Ambulatory Visit: Payer: Medicare Other

## 2016-07-24 ENCOUNTER — Encounter: Payer: Medicare Other | Admitting: Cardiothoracic Surgery

## 2016-08-14 ENCOUNTER — Ambulatory Visit (INDEPENDENT_AMBULATORY_CARE_PROVIDER_SITE_OTHER): Payer: Medicare Other | Admitting: Cardiothoracic Surgery

## 2016-08-14 ENCOUNTER — Encounter: Payer: Self-pay | Admitting: Cardiothoracic Surgery

## 2016-08-14 ENCOUNTER — Ambulatory Visit
Admission: RE | Admit: 2016-08-14 | Discharge: 2016-08-14 | Disposition: A | Discharge status: Home / Self Care | Payer: Medicare Other | Source: Ambulatory Visit | Attending: Cardiothoracic Surgery | Admitting: Cardiothoracic Surgery

## 2016-08-14 VITALS — BP 129/79 | HR 75 | Resp 16 | Ht 64.5 in | Wt 159.0 lb

## 2016-08-14 DIAGNOSIS — I712 Thoracic aortic aneurysm, without rupture, unspecified: Secondary | ICD-10-CM

## 2016-08-14 MED ORDER — IOPAMIDOL (ISOVUE-370) INJECTION 76%
75.0000 mL | Freq: Once | INTRAVENOUS | Status: AC | PRN
  Administered 2016-08-14: 75 mL via INTRAVENOUS

## 2016-08-14 NOTE — Progress Notes (Signed)
PCP is Ezequiel KayserPERINI,MARK A, MD Referring Provider is Rodrigo RanPerini, Mark, MD  Chief Complaint  Patient presents with  . TAA    6 month f/u with CTA CHEST  Patient examined, CT angiogram of the thoracic aorta personally reviewed and counseled with patient  HPI: Patient presents with 6 month surveillance CTA of the thoracic aorta for a known fusiform ascending aneurysm last measured at 4.4 cm. This is asymptomatic. The patient has mild dyslipidemia. She is on no antihypertensive medication. There is no family history of aortic dissection. Previous echo shows a probable bicuspid aortic valve without aortic stenosis.  The patient relates she does have some exertional mild pressing chest pain which resolves with rest. She denies nocturnal pain or pain at rest.  CTA performed today shows a decrease in the ascending aortic diameter to 4.2-4.3 cm. This probably reflects some of the pulsatility of the aorta which can change the diameter dimension slightly.  Past Medical History:  Diagnosis Date  . Anginal pain (HCC)   . Arthritis   . GERD (gastroesophageal reflux disease)   . Hyperlipemia   . Migraines     Past Surgical History:  Procedure Laterality Date  . BACK SURGERY    . HEMORRHOID SURGERY    . Thoracicc Aortic Henrene DodgeEstasia 07/2009    . TONSILLECTOMY    . TOTAL KNEE ARTHROPLASTY Right 09/25/2015   Procedure: TOTAL KNEE ARTHROPLASTY;  Surgeon: Ollen GrossFrank Aluisio, MD;  Location: [facility];  Service: Orthopedics;  Laterality: Right;    No family history on file.  Social History Social History  Substance Use Topics  . Smoking status: Former Smoker    Quit date: 08/15/1963  . Smokeless tobacco: Never Used  . Alcohol use 0.6 oz/week    1 Glasses of wine per week     Comment: almost every night    Current Outpatient Prescriptions  Medication Sig Dispense Refill  . aspirin EC 325 MG tablet Take 325 mg by mouth daily.    Marland Kitchen. atorvastatin (LIPITOR) 20 MG tablet Take 20 mg by mouth 3 (three) times a week. MWF     . CALCIUM PO Take 1,000 mg by mouth daily.    . Coenzyme Q10 300 MG CAPS Take 1 capsule by mouth. Monday, Wednesday, Friday    . CRANBERRY PO Take 300 mg by mouth daily.    . Cyanocobalamin (VITAMIN B-12 PO) Take 2,500 mg by mouth daily.    Marland Kitchen. docusate sodium (COLACE) 100 MG capsule Take 100 mg by mouth every morning.     . Ergocalciferol (VITAMIN D2) 2000 units TABS Take 1 tablet by mouth daily.    Marland Kitchen. estradiol (ESTRACE) 0.1 MG/GM vaginal cream Place 1 Applicatorful vaginally once a week.    . furosemide (LASIX) 20 MG tablet Take 20 mg by mouth every morning.     . Omega-3 300 MG CAPS Take 1 capsule by mouth daily.    Marland Kitchen. omeprazole (PRILOSEC OTC) 20 MG tablet Take 20 mg by mouth daily as needed (acid reflux).    . potassium chloride (K-DUR,KLOR-CON) 10 MEQ tablet Take 10 mEq by mouth every morning.      No current facility-administered medications for this visit.     Allergies  Allergen Reactions  . Fluconazole Rash    Review of Systems        Review of Systems :  [ y ] = yes, [  ] = no        General :  Weight gain [   ]  Weight loss  [   ]  Fatigue [  ]  Fever [  ]  Chills  [  ]                                Weakness  [  ]           HEENT    Headache Hawley.Ghazi  ]  Dizziness [  ]  Blurred vision [  ] Glaucoma  [  ]                          Nosebleeds [  ] Painful or loose teeth [  ]        Cardiac :  Chest pain/ pressure [ Mild exertional ]  Resting SOB [  ] exertional SOB [  ]                        Orthopnea [  ]  Pedal edema  [  ]  Palpitations [  ] Syncope/presyncope [ ]                         Paroxysmal nocturnal dyspnea [  ]         Pulmonary : cough [  ]  wheezing [  ]  Hemoptysis [  ] Sputum [  ] Snoring [  ]                              Pneumothorax [  ]  Sleep apnea [  ]        GI : Vomiting [  ]  Dysphagia [  ]  Melena  [  ]  Abdominal pain [  ] BRBPR [  ]              Heart burn [  ]  Constipation [  ] Diarrhea  [  ] Colonoscopy [   ]        GU : Hematuria [  ]   Dysuria [  ]  Nocturia [  ] UTI's [  ]        Vascular : Claudication [  ]  Rest pain [  ]  DVT [  ] Vein stripping [  ] leg ulcers [  ]                          TIA [  ] Stroke [  ]  Varicose veins [  ]        NEURO :  Headaches  [  ] Seizures [  ] Vision changes [  ] Paresthesias [  ]                                       Seizures [  ]        Musculoskeletal :  Arthritis [  ] Gout  [  ]  Back pain [  ]  Joint pain [  ]right total knee replacement almost 1 year ago, walking well        Skin :  Rash [  ]  Melanoma [  ] Sores [  ]  Heme : Bleeding problems [  ]Clotting Disorders [  ] Anemia [  ]Blood Transfusion [ ]         Endocrine : Diabetes [  ] Heat or Cold intolerance [  ] Polyuria [  ]excessive thirst [ ]         Psych : Depression [  ]  Anxiety [  ]  Psych hospitalizations [  ] Memory change [  ]                                              BP 129/79   Pulse 75   Resp 16   Ht 5' 4.5" (1.638 m)   Wt 159 lb (72.1 kg)   SpO2 98% Comment: ON RA  BMI 26.87 kg/m  Physical Exam      Exam    General- alert and comfortable   Lungs- clear without rales, wheezes   Cor- regular rate and rhythm, no murmur , gallop   Abdomen- soft, non-tender   Extremities - warm, non-tender, minimal edema   Neuro- oriented, appropriate, no focal weakness   Diagnostic Tests: CTA shows stable fusiform ascending aneurysm without intramural hematoma or penetrating ulcer. Aortic arch and descending thoracic aorta remained normal.  Impression: Asymptomatic mild fusiform ascending aneurysm stable for the past year. Diameter currently 4.3 cm. Risk of dissection is minimal at less than 1% per year.  Plan: Continue annual surveillance scans. Return in one year. If patient develops hypertension starting a beta blocker would probably be beneficial. Mikey Bussing, MD [facility] [phone]

## 2016-08-18 ENCOUNTER — Emergency Department (HOSPITAL_BASED_OUTPATIENT_CLINIC_OR_DEPARTMENT_OTHER)
Admission: EM | Admit: 2016-08-18 | Discharge: 2016-08-18 | Disposition: A | Discharge status: Home / Self Care | Payer: Medicare Other | Attending: Emergency Medicine | Admitting: Emergency Medicine

## 2016-08-18 ENCOUNTER — Encounter (HOSPITAL_BASED_OUTPATIENT_CLINIC_OR_DEPARTMENT_OTHER): Payer: Self-pay | Admitting: *Deleted

## 2016-08-18 DIAGNOSIS — L500 Allergic urticaria: Secondary | ICD-10-CM | POA: Diagnosis not present

## 2016-08-18 DIAGNOSIS — T7840XA Allergy, unspecified, initial encounter: Secondary | ICD-10-CM | POA: Diagnosis present

## 2016-08-18 DIAGNOSIS — Z87891 Personal history of nicotine dependence: Secondary | ICD-10-CM | POA: Diagnosis not present

## 2016-08-18 LAB — BASIC METABOLIC PANEL
Anion gap: 5 (ref 5–15)
BUN: 29 mg/dL — ABNORMAL HIGH (ref 6–20)
CO2: 26 mmol/L (ref 22–32)
Calcium: 8.6 mg/dL — ABNORMAL LOW (ref 8.9–10.3)
Chloride: 109 mmol/L (ref 101–111)
Creatinine, Ser: 0.8 mg/dL (ref 0.44–1.00)
GFR calc Af Amer: >60 mL/min (ref >60)
GFR calc non Af Amer: >60 mL/min (ref >60)
Glucose, Bld: 91 mg/dL (ref 65–99)
Potassium: 4.3 mmol/L (ref 3.5–5.1)
Sodium: 140 mmol/L (ref 135–145)

## 2016-08-18 LAB — CBC WITH DIFFERENTIAL/PLATELET
Basophils Absolute: 0 10*3/uL (ref 0.0–0.1)
Basophils Relative: 0 %
Eosinophils Absolute: 0.1 10*3/uL (ref 0.0–0.7)
Eosinophils Relative: 2 %
HCT: 38.6 % (ref 36.0–46.0)
Hemoglobin: 12.9 g/dL (ref 12.0–15.0)
Lymphocytes Relative: 33 %
Lymphs Abs: 0.9 10*3/uL (ref 0.7–4.0)
MCH: 30.9 pg (ref 26.0–34.0)
MCHC: 33.4 g/dL (ref 30.0–36.0)
MCV: 92.6 fL (ref 78.0–100.0)
Monocytes Absolute: 0.2 10*3/uL (ref 0.1–1.0)
Monocytes Relative: 6 %
Neutro Abs: 1.5 10*3/uL — ABNORMAL LOW (ref 1.7–7.7)
Neutrophils Relative %: 60 %
Platelets: 127 10*3/uL — ABNORMAL LOW (ref 150–400)
RBC: 4.17 MIL/uL (ref 3.87–5.11)
RDW: 12.5 % (ref 11.5–15.5)
WBC: 2.6 10*3/uL — ABNORMAL LOW (ref 4.0–10.5)

## 2016-08-18 MED ORDER — FAMOTIDINE 20 MG PO TABS: 20.0000 mg | ORAL_TABLET | Freq: Two times a day (BID) | ORAL | 0 refills | Status: DC

## 2016-08-18 MED ORDER — DIPHENHYDRAMINE HCL 50 MG/ML IJ SOLN
12.5000 mg | Freq: Once | INTRAMUSCULAR | Status: AC
  Administered 2016-08-18: 12.5 mg via INTRAVENOUS
  Filled 2016-08-18: qty 1

## 2016-08-18 MED ORDER — SODIUM CHLORIDE 0.9 % IV BOLUS (SEPSIS)
1000.0000 mL | Freq: Once | INTRAVENOUS | Status: AC
  Administered 2016-08-18: 1000 mL via INTRAVENOUS

## 2016-08-18 MED ORDER — METHYLPREDNISOLONE SODIUM SUCC 125 MG IJ SOLR
125.0000 mg | Freq: Once | INTRAMUSCULAR | Status: AC
  Administered 2016-08-18: 125 mg via INTRAVENOUS
  Filled 2016-08-18: qty 2

## 2016-08-18 MED ORDER — PREDNISONE 20 MG PO TABS: ORAL_TABLET | ORAL | 0 refills | Status: DC

## 2016-08-18 MED ORDER — ONDANSETRON HCL 4 MG/2ML IJ SOLN
4.0000 mg | Freq: Once | INTRAMUSCULAR | Status: AC
  Administered 2016-08-18: 4 mg via INTRAVENOUS
  Filled 2016-08-18: qty 2

## 2016-08-18 MED ORDER — FAMOTIDINE IN NACL 20-0.9 MG/50ML-% IV SOLN
20.0000 mg | Freq: Once | INTRAVENOUS | Status: AC
  Administered 2016-08-18: 20 mg via INTRAVENOUS
  Filled 2016-08-18: qty 50

## 2016-08-18 NOTE — Discharge Instructions (Signed)
Avoid wasps.  Take prednisone as prescribed.   Take benadryl 50 mg every 6-8 hrs for the next 2 days then as needed.   Take pepcid 20 mg twice daily for 5 days.   See your doctor  Return to ER if you have worse rash, trouble breathing, trouble swallowing.

## 2016-08-18 NOTE — ED Notes (Signed)
ED Provider at bedside. 

## 2016-08-18 NOTE — ED Provider Notes (Signed)
[facility] Provider Note   CSN: [id number] Arrival date & time: 08/18/16  0719     History   Chief Complaint Chief Complaint  Patient presents with  . Allergic Reaction    HPI Linda Lee is a 81 y.o. female hx of GERD, HL, migraines, here with possible allergic reaction. Patient states that she was driving her hands and did not realize it was a wasp and it bit her on the dorsal aspect of her right hand. She states that soon afterwards, she started having swelling around that area and diffuse hives. He trouble breathing or trouble swallowing or shortness of breath. Patient took 50 mg of Benadryl prior to arrival. Patient states that she has been bitten by wasps before with no reaction. No new soaps or detergents or foods.    The history is provided by the patient.    Past Medical History:  Diagnosis Date  . Anginal pain (HCC)   . Arthritis   . GERD (gastroesophageal reflux disease)   . Hyperlipemia   . Migraines     Patient Active Problem List   Diagnosis Date Noted  . OA (osteoarthritis) of knee 09/25/2015  . UTI (lower urinary tract infection) 09/04/2012  . Diastolic CHF, chronic (HCC) 09/04/2012  . Subjective visual disturbance, right eye 09/03/2012  . Numbness around mouth 09/03/2012  . Vision disturbance 09/03/2012  . THORACIC AORTIC ANEURYSM 07/06/2009  . CHEST PAIN UNSPECIFIED 07/06/2009  . HYPERLIPIDEMIA 06/30/2009  . GERD 06/30/2009  . LEG CRAMPS 06/30/2009    Past Surgical History:  Procedure Laterality Date  . BACK SURGERY    . HEMORRHOID SURGERY    . 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    . TONSILLECTOMY    . TOTAL KNEE ARTHROPLASTY Right 09/25/2015   Procedure: TOTAL KNEE ARTHROPLASTY;  Surgeon: Ollen Gross, MD;  Location: [facility];  Service: Orthopedics;  Laterality: Right;    OB History    No data available       Home Medications    Prior to Admission medications   Medication Sig Start Date End Date Taking? Authorizing  Provider  aspirin EC 325 MG tablet Take 325 mg by mouth daily.    Historical Provider, MD  atorvastatin (LIPITOR) 20 MG tablet Take 20 mg by mouth 3 (three) times a week. MWF    Historical Provider, MD  CALCIUM PO Take 1,000 mg by mouth daily.    Historical Provider, MD  Coenzyme Q10 300 MG CAPS Take 1 capsule by mouth. Monday, Wednesday, Friday    Historical Provider, MD  CRANBERRY PO Take 300 mg by mouth daily.    Historical Provider, MD  Cyanocobalamin (VITAMIN B-12 PO) Take 2,500 mg by mouth daily.    Historical Provider, MD  docusate sodium (COLACE) 100 MG capsule Take 100 mg by mouth every morning.     Historical Provider, MD  Ergocalciferol (VITAMIN D2) 2000 units TABS Take 1 tablet by mouth daily.    Historical Provider, MD  estradiol (ESTRACE) 0.1 MG/GM vaginal cream Place 1 Applicatorful vaginally once a week.    Historical Provider, MD  furosemide (LASIX) 20 MG tablet Take 20 mg by mouth every morning.     Historical Provider, MD  Omega-3 300 MG CAPS Take 1 capsule by mouth daily.    Historical Provider, MD  omeprazole (PRILOSEC OTC) 20 MG tablet Take 20 mg by mouth daily as needed (acid reflux).    Historical Provider, MD  potassium chloride (K-DUR,KLOR-CON) 10 MEQ tablet Take 10 mEq  by mouth every morning.     Historical Provider, MD    Family History History reviewed. No pertinent family history.  Social History Social History  Substance Use Topics  . Smoking status: Former Smoker    Quit date: 08/15/1963  . Smokeless tobacco: Never Used  . Alcohol use 0.6 oz/week    1 Glasses of wine per week     Comment: almost every night     Allergies   Fluconazole   Review of Systems Review of Systems  Skin: Positive for rash.  All other systems reviewed and are negative.    Physical Exam Updated Vital Signs BP (!) 123/54 (BP Location: Right Arm)   Pulse 72   Temp 97.5 F (36.4 C) (Oral)   Resp 20   Ht 5\' 5"  (1.651 m)   Wt 159 lb (72.1 kg)   SpO2 94%   BMI 26.46  kg/m   Physical Exam  Constitutional: She is oriented to person, place, and time. She appears well-developed.  HENT:  Head: Normocephalic.  Mouth/Throat: Oropharynx is clear and moist.  Posterior pharynx clear, not swollen   Eyes: Pupils are equal, round, and reactive to light.  Neck: Normal range of motion.  Cardiovascular: Normal rate, regular rhythm and normal heart sounds.   Pulmonary/Chest: Effort normal and breath sounds normal. No respiratory distress. She has no wheezes.  No wheezing   Abdominal: Soft. Bowel sounds are normal. She exhibits no distension. There is no tenderness.  Musculoskeletal: Normal range of motion.  Neurological: She is alert and oriented to person, place, and time.  Skin:  Diffuse hives on torso, back, legs. There is hives on dorsum R hand with no obvious cellulitis   Psychiatric: She has a normal mood and affect.  Nursing note and vitals reviewed.    ED Treatments / Results  Labs (all labs ordered are listed, but only abnormal results are displayed) Labs Reviewed  CBC WITH DIFFERENTIAL/PLATELET - Abnormal; Notable for the following:       Result Value   WBC 2.6 (*)    Platelets 127 (*)    Neutro Abs 1.5 (*)    All other components within normal limits  BASIC METABOLIC PANEL - Abnormal; Notable for the following:    BUN 29 (*)    Calcium 8.6 (*)    All other components within normal limits    EKG  EKG Interpretation None       Radiology No results found.  Procedures Procedures (including critical care time)  Medications Ordered in ED Medications  methylPREDNISolone sodium succinate (SOLU-MEDROL) 125 mg/2 mL injection 125 mg (125 mg Intravenous Given 08/18/16 0806)  famotidine (PEPCID) IVPB 20 mg premix (0 mg Intravenous Stopped 08/18/16 0809)  diphenhydrAMINE (BENADRYL) injection 12.5 mg (12.5 mg Intravenous Given 08/18/16 0807)  sodium chloride 0.9 % bolus 1,000 mL (0 mLs Intravenous Stopped 08/18/16 0854)  ondansetron (ZOFRAN)  injection 4 mg (4 mg Intravenous Given 08/18/16 0807)     Initial Impression / Assessment and Plan / ED Course  I have reviewed the triage vital signs and the nursing notes.  Pertinent labs & imaging results that were available during my care of the patient were reviewed by me and considered in my medical decision making (see chart for details).  Clinical Course     Linda Lee is a 81 y.o. female here with hives, allergic reaction from wasp sting. Will give steroids, more benadryl, pepcid. No airway issues currently and will hold off on epi.  10:10 AM Felt better. Hives improved. No trouble swallowing and OP still clear. Recommend benadryl 50 mg every 6- 8 hrs, prednisone, pepcid prn.   Final Clinical Impressions(s) / ED Diagnoses   Final diagnoses:  None    New Prescriptions New Prescriptions   No medications on file     Charlynne Panderavid Hsienta Dujuan Stankowski, MD 08/18/16 1010

## 2016-08-18 NOTE — ED Triage Notes (Addendum)
Pt c/o bee sting to right hand x 1 hr ago, rash to face arms and abd. No resp distress noted no facial swelling no tongue swelling Benadryl 50 mg PTA

## 2016-09-13 ENCOUNTER — Ambulatory Visit (INDEPENDENT_AMBULATORY_CARE_PROVIDER_SITE_OTHER): Payer: Medicare Other | Admitting: Allergy

## 2016-09-13 ENCOUNTER — Encounter: Payer: Self-pay | Admitting: Allergy

## 2016-09-13 VITALS — BP 118/70 | HR 95 | Temp 97.9°F | Resp 17 | Ht 63.5 in | Wt 160.2 lb

## 2016-09-13 DIAGNOSIS — T6391XA Toxic effect of contact with unspecified venomous animal, accidental (unintentional), initial encounter: Secondary | ICD-10-CM

## 2016-09-13 NOTE — Patient Instructions (Addendum)
Allergic reaction following wasp sting    - your symptoms following wasp sting warrant skin testing to venoms (stinging insects).   We will contact you when we have scheduled our venom testing day.    Please hold all antihistamines for 3 days prior to this day.     - have access to your Epipen at all times for use if stung again and you have a reaction.   Follow emergency action plan.      - pending your venom testing results if positive would recommend undergoing venom immunotherapy (venom allergy shots) to decrease chance of severe allergic reaction to subsequent stings in the future.    Follow-up for venom testing

## 2016-09-13 NOTE — Progress Notes (Signed)
New Patient Note  RE: Linda Lee MRN: [id number] DOB: [birth year] Date of Office Visit: 09/13/2016  Referring provider: Rodrigo Ran, MD Primary care provider: Ezequiel Kayser, MD  Chief Complaint: allergic reaction to wasp sting  History of present illness: Linda Lee is a 81 y.o. female presenting today for consultation for allergic reaction following sting.    She reports being stung by a wasp on 08/18/16.  She reports being stung on her right hand which started to swelling and hurt while at home in the kitchen.  She states she went to take the kitchen towel off the sink and the wasp was underneath.  She reports seeing several dead/near dead wasps in the home but has had exterminator out who didn't find any nests.  She took a benadryl.   She reports she then got dizzy, nauseated and felt like she could pass out and had diffuse hives.  She did not have a syncopal episode. She then went to ED for evaluation.  She later saw her PCP who prescribed Epipen and allergy referral.    She presented to ED on 08/18/16 for Cc: allergic reaction.  Per ED note it was notedsShe had local swelling and diffuse hives.  On exam in the ED she was noted to have diffuse hives on her torso, back and legs. There were hives on her dorsum right hand with no obvious cellulitis.  She was treated with methylprednisolone IV, Benadryl IV, Pepcid IV, Zofran and a normal saline bolus.  Symptoms improved and she was discharged with recommendation to continue Benadryl 50 mg every 6 hours, prednisone and Pepcid as needed She reports being stung by stinging insects before without any reaction.  She has a daughter with a venom allergy who had syncopal episodes following sting.    She does report sneezing and runny nose. Symptoms are year round but reports symptoms are not great enough to take medications for.    She has no history of asthma, eczema or food allergy.      Review of systems: Review of Systems    Constitutional: Negative for chills, fever and malaise/fatigue.  HENT: Negative for congestion, ear discharge, ear pain, sinus pain and sore throat.   Eyes: Negative for discharge and redness.  Respiratory: Negative for cough, shortness of breath and wheezing.   Cardiovascular: Negative for chest pain.  Gastrointestinal: Negative for abdominal pain, heartburn, nausea and vomiting.  Musculoskeletal: Negative for joint pain and myalgias.  Skin: Positive for itching and rash.  Neurological: Positive for dizziness. Negative for loss of consciousness and headaches.    All other systems negative unless noted above in HPI  Past medical history: Past Medical History:  Diagnosis Date  . Anginal pain (HCC)   . Angio-edema   . Arthritis   . GERD (gastroesophageal reflux disease)   . Hyperlipemia   . Migraines     Past surgical history: Past Surgical History:  Procedure Laterality Date  . ADENOIDECTOMY    . BACK SURGERY    . HEMORRHOID SURGERY    . 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    . TONSILLECTOMY    . TOTAL KNEE ARTHROPLASTY Right 09/25/2015   Procedure: TOTAL KNEE ARTHROPLASTY;  Surgeon: Ollen Gross, MD;  Location: [facility];  Service: Orthopedics;  Laterality: Right;    Family history:  Family History  Problem Relation Age of Onset  . Allergic rhinitis Neg Hx   . Angioedema Neg Hx   . Asthma Neg Hx   . Eczema  Neg Hx   . Immunodeficiency Neg Hx   . Urticaria Neg Hx     Social history: She lives in a home with carpeting with gas heating and central cooling. There is a dog inside the home. There is no concern for damage or mildew or roaches in the home. She is retired.  Social History Main Topics  . Smoking status: Former Smoker    Quit date: 08/15/1963  . Smokeless tobacco: Never Used  . Alcohol use 0.6 oz/week    1 Glasses of wine per week     Comment: almost every night   Medication List: Allergies as of 09/13/2016      Reactions   Fluconazole Rash       Medication List       Accurate as of 09/13/16 10:30 AM. Always use your most recent med list.          aspirin EC 325 MG tablet Take 325 mg by mouth daily.   atorvastatin 20 MG tablet Commonly known as:  LIPITOR Take 20 mg by mouth 3 (three) times a week. MWF   CALCIUM PO Take 1,000 mg by mouth daily.   Coenzyme Q10 300 MG Caps Take 1 capsule by mouth. Monday, Wednesday, Friday   EPINEPHrine 0.3 mg/0.3 mL Soaj injection Commonly known as:  EPI-PEN Inject 0.3 mg into the muscle once.   estradiol 0.1 MG/GM vaginal cream Commonly known as:  ESTRACE Place 1 Applicatorful vaginally once a week.   furosemide 20 MG tablet Commonly known as:  LASIX Take 20 mg by mouth every morning.   omeprazole 20 MG tablet Commonly known as:  PRILOSEC OTC Take 20 mg by mouth daily as needed (acid reflux).   potassium chloride 10 MEQ tablet Commonly known as:  K-DUR,KLOR-CON Take 10 mEq by mouth every morning.   UNABLE TO FIND Apply 1 application topically as needed. Protozone-HC 2.5 cream   VITAMIN B-12 PO Take 2,500 mg by mouth daily.   Vitamin D2 2000 units Tabs Take 1 tablet by mouth daily.       Known medication allergies: Allergies  Allergen Reactions  . Fluconazole Rash     Physical examination: Blood pressure 118/70, pulse 95, temperature 97.9 F (36.6 C), temperature source Oral, resp. rate 17, height 5' 3.5" (1.613 m), weight 160 lb 3.2 oz (72.7 kg), SpO2 98 %.  General: Alert, interactive, in no acute distress. HEENT: TMs pearly gray, turbinates minimally edematous without discharge, post-pharynx non erythematous. Neck: Supple without lymphadenopathy. Lungs: Clear to auscultation without wheezing, rhonchi or rales. {no increased work of breathing. CV: Normal S1, S2 without murmurs. Abdomen: Nondistended, nontender. Skin: Warm and dry, without lesions or rashes. Extremities:  No clubbing, cyanosis or edema. Neuro:   Grossly intact.  Diagnositics/Labs: None  today  Assessment and plan:   Venom allergy, presumed     - She had a local and systemic symptoms following a sting     - She was treated for allergic reaction in the ED with steroids, antihistamines and H2 blocker     - She has been prescribed an EpiPen by her PCP and is comfortable with use     - Given her symptoms following the staying she warrants skin testing to venoms.  We will be scheduling a venom day soon and we will contact her when this day is to return for venom testing.  She was told to hold all antihistamines for 3 days prior to this day     - If  she is allergic to any stinging insects recommend she undergo venom immunotherapy which is very effective at decreasing the likelihood of severe allergic reactions following any subsequent stings.  Briefly discussed venom immunotherapy and protocol today with patient. She is morbidly   - have access to your Epipen at all times for use if stung again and you have a reaction.   Follow emergency action plan.    Follow-up for venom testing, she will be notified  I appreciate the opportunity to take part in Linda Lee's care. Please do not hesitate to contact me with questions.  Sincerely,   Margo Aye, MD Allergy/Immunology [facility]

## 2016-11-09 ENCOUNTER — Emergency Department (HOSPITAL_BASED_OUTPATIENT_CLINIC_OR_DEPARTMENT_OTHER)
Admission: EM | Admit: 2016-11-09 | Discharge: 2016-11-09 | Disposition: A | Discharge status: Home / Self Care | Payer: Medicare Other | Attending: Emergency Medicine | Admitting: Emergency Medicine

## 2016-11-09 ENCOUNTER — Encounter (HOSPITAL_BASED_OUTPATIENT_CLINIC_OR_DEPARTMENT_OTHER): Payer: Self-pay | Admitting: *Deleted

## 2016-11-09 DIAGNOSIS — Z87891 Personal history of nicotine dependence: Secondary | ICD-10-CM | POA: Insufficient documentation

## 2016-11-09 DIAGNOSIS — R197 Diarrhea, unspecified: Secondary | ICD-10-CM | POA: Diagnosis present

## 2016-11-09 DIAGNOSIS — K529 Noninfective gastroenteritis and colitis, unspecified: Secondary | ICD-10-CM | POA: Diagnosis not present

## 2016-11-09 DIAGNOSIS — I5032 Chronic diastolic (congestive) heart failure: Secondary | ICD-10-CM | POA: Insufficient documentation

## 2016-11-09 DIAGNOSIS — Z7982 Long term (current) use of aspirin: Secondary | ICD-10-CM | POA: Insufficient documentation

## 2016-11-09 DIAGNOSIS — Z79899 Other long term (current) drug therapy: Secondary | ICD-10-CM | POA: Diagnosis not present

## 2016-11-09 LAB — COMPREHENSIVE METABOLIC PANEL
ALT: 13 U/L — ABNORMAL LOW (ref 14–54)
AST: 19 U/L (ref 15–41)
Albumin: 3.5 g/dL (ref 3.5–5.0)
Alkaline Phosphatase: 74 U/L (ref 38–126)
Anion gap: 9 (ref 5–15)
BUN: 15 mg/dL (ref 6–20)
CO2: 23 mmol/L (ref 22–32)
Calcium: 8.3 mg/dL — ABNORMAL LOW (ref 8.9–10.3)
Chloride: 106 mmol/L (ref 101–111)
Creatinine, Ser: 0.76 mg/dL (ref 0.44–1.00)
GFR calc Af Amer: >60 mL/min (ref >60)
GFR calc non Af Amer: >60 mL/min (ref >60)
Glucose, Bld: 105 mg/dL — ABNORMAL HIGH (ref 65–99)
Potassium: 3.7 mmol/L (ref 3.5–5.1)
Sodium: 138 mmol/L (ref 135–145)
Total Bilirubin: 0.6 mg/dL (ref 0.3–1.2)
Total Protein: 6.3 g/dL — ABNORMAL LOW (ref 6.5–8.1)

## 2016-11-09 LAB — CBC WITH DIFFERENTIAL/PLATELET
Basophils Absolute: 0 10*3/uL (ref 0.0–0.1)
Basophils Relative: 0 %
Eosinophils Absolute: 0 10*3/uL (ref 0.0–0.7)
Eosinophils Relative: 0 %
HCT: 37.3 % (ref 36.0–46.0)
Hemoglobin: 12.5 g/dL (ref 12.0–15.0)
Lymphocytes Relative: 7 %
Lymphs Abs: 0.6 10*3/uL — ABNORMAL LOW (ref 0.7–4.0)
MCH: 31.6 pg (ref 26.0–34.0)
MCHC: 33.5 g/dL (ref 30.0–36.0)
MCV: 94.4 fL (ref 78.0–100.0)
Monocytes Absolute: 0.5 10*3/uL (ref 0.1–1.0)
Monocytes Relative: 6 %
Neutro Abs: 8 10*3/uL — ABNORMAL HIGH (ref 1.7–7.7)
Neutrophils Relative %: 87 %
Platelets: 141 10*3/uL — ABNORMAL LOW (ref 150–400)
RBC: 3.95 MIL/uL (ref 3.87–5.11)
RDW: 12.8 % (ref 11.5–15.5)
WBC: 9.1 10*3/uL (ref 4.0–10.5)

## 2016-11-09 LAB — URINALYSIS, ROUTINE W REFLEX MICROSCOPIC
Bilirubin Urine: NEGATIVE
Glucose, UA: NEGATIVE mg/dL
Hgb urine dipstick: NEGATIVE
Ketones, ur: 15 mg/dL — AB
Nitrite: NEGATIVE
Protein, ur: 30 mg/dL — AB
Specific Gravity, Urine: 1.026 (ref 1.005–1.030)
pH: 5.5 (ref 5.0–8.0)

## 2016-11-09 LAB — URINALYSIS, MICROSCOPIC (REFLEX)

## 2016-11-09 MED ORDER — ONDANSETRON 4 MG PO TBDP
4.0000 mg | ORAL_TABLET | Freq: Once | ORAL | Status: AC | PRN
  Administered 2016-11-09: 4 mg via ORAL
  Filled 2016-11-09: qty 1

## 2016-11-09 MED ORDER — ONDANSETRON 4 MG PO TBDP: ORAL_TABLET | ORAL | 0 refills | Status: DC

## 2016-11-09 MED ORDER — SODIUM CHLORIDE 0.9 % IV BOLUS (SEPSIS)
1000.0000 mL | Freq: Once | INTRAVENOUS | Status: AC
  Administered 2016-11-09: 1000 mL via INTRAVENOUS

## 2016-11-09 NOTE — ED Triage Notes (Signed)
Pt reports waking up this morning with generalized body aches, nausea, diarrhea. Pt reports recent course of antibiotics for UTI. Reports chills but denies known fever.  Pt currently vomiting in triage.

## 2016-11-09 NOTE — ED Notes (Signed)
Alert, NAD, calm, interactive, resps e/u, speaking in clear complete sentences, no dyspnea noted, skin W&D, VSS, mentions back pain, also nvd, nausea improved, (denies: sob, dizziness or visual changes). Family at [facility].

## 2016-11-09 NOTE — ED Notes (Signed)
EDP into room, prior to RN assessment, see MD notes, pending orders.   

## 2016-11-09 NOTE — ED Notes (Signed)
Pt given cup to attempt stool sample.

## 2016-11-09 NOTE — ED Provider Notes (Signed)
[facility] Provider Note   CSN: [id number] Arrival date & time: 11/09/16  1715  By signing my name below, I, Linda Lee, attest that this documentation has been prepared under the direction and in the presence of Rolan Bucco, MD . Electronically Signed: Nelwyn Lee, Scribe. 11/09/2016. 7:40 PM.  History   Chief Complaint Chief Complaint  Patient presents with  . Emesis  . Diarrhea   The history is provided by the patient. No language interpreter was used.    HPI Comments:  Linda Lee is a 81 y.o. female with pmhx of HLDwho presents to the Emergency Department complaining of sudden-onset, frequent diarrhea onset today. Pt describes her diarrhea as "uncontrollable". She has a recent procedural hx of colonoscopy (11/06/16) which was tolerated well without complication. Pt reports associated vomiting, abdominal cramping, nausea, fever (tmax 102) and diffuse myalgias. She notes that she has a recent dx of UTI and finished a round of abx yesterday. Denies any bloody stool, dysuria, difficulty urinating or any other symptoms.    Past Medical History:  Diagnosis Date  . Anginal pain (HCC)   . Angio-edema   . Arthritis   . GERD (gastroesophageal reflux disease)   . Hyperlipemia   . Migraines     Patient Active Problem List   Diagnosis Date Noted  . OA (osteoarthritis) of knee 09/25/2015  . UTI (lower urinary tract infection) 09/04/2012  . Diastolic CHF, chronic (HCC) 09/04/2012  . Subjective visual disturbance, right eye 09/03/2012  . Numbness around mouth 09/03/2012  . Vision disturbance 09/03/2012  . THORACIC AORTIC ANEURYSM 07/06/2009  . CHEST PAIN UNSPECIFIED 07/06/2009  . HYPERLIPIDEMIA 06/30/2009  . GERD 06/30/2009  . LEG CRAMPS 06/30/2009    Past Surgical History:  Procedure Laterality Date  . ADENOIDECTOMY    . BACK SURGERY    . HEMORRHOID SURGERY    . 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    . TONSILLECTOMY    . TOTAL KNEE ARTHROPLASTY Right  09/25/2015   Procedure: TOTAL KNEE ARTHROPLASTY;  Surgeon: Ollen Gross, MD;  Location: [facility];  Service: Orthopedics;  Laterality: Right;    OB History    No data available       Home Medications    Prior to Admission medications   Medication Sig Start Date End Date Taking? Authorizing Provider  aspirin EC 325 MG tablet Take 325 mg by mouth daily.   Yes Historical Provider, MD  atorvastatin (LIPITOR) 20 MG tablet Take 20 mg by mouth 3 (three) times a week. Monday   Yes Historical Provider, MD  CALCIUM PO Take 1,000 mg by mouth daily.   Yes Historical Provider, MD  Coenzyme Q10 300 MG CAPS Take 1 capsule by mouth. Tuesday, Thursday   Yes Historical Provider, MD  Cyanocobalamin (VITAMIN B-12 PO) Take 2,500 mg by mouth daily.   Yes Historical Provider, MD  EPINEPHrine 0.3 mg/0.3 mL IJ SOAJ injection Inject 0.3 mg into the muscle once. 08/20/16  Yes Historical Provider, MD  Ergocalciferol (VITAMIN D2) 2000 units TABS Take 1 tablet by mouth daily.   Yes Historical Provider, MD  estradiol (ESTRACE) 0.1 MG/GM vaginal cream Place 1 Applicatorful vaginally once a week.   Yes Historical Provider, MD  furosemide (LASIX) 20 MG tablet Take 20 mg by mouth every morning.    Yes Historical Provider, MD  omeprazole (PRILOSEC OTC) 20 MG tablet Take 20 mg by mouth daily as needed (acid reflux).   Yes Historical Provider, MD  potassium chloride (K-DUR,KLOR-CON) 10 MEQ tablet  Take 10 mEq by mouth every morning.    Yes Historical Provider, MD  UNABLE TO FIND Apply 1 application topically as needed. Protozone-HC 2.5 cream   Yes Historical Provider, MD  ondansetron (ZOFRAN ODT) 4 MG disintegrating tablet  ODT q4 hours prn nausea/vomit 11/09/16   Rolan Bucco, MD    Family History Family History  Problem Relation Age of Onset  . Allergic rhinitis Neg Hx   . Angioedema Neg Hx   . Asthma Neg Hx   . Eczema Neg Hx   . Immunodeficiency Neg Hx   . Urticaria Neg Hx     Social History Social History    Substance Use Topics  . Smoking status: Former Smoker    Quit date: 08/15/1963  . Smokeless tobacco: Never Used  . Alcohol use 0.6 oz/week    1 Glasses of wine per week     Comment: almost every night     Allergies   Wasp venom and Fluconazole   Review of Systems Review of Systems  Constitutional: Positive for fever. Negative for chills, diaphoresis and fatigue.  HENT: Negative for congestion, rhinorrhea and sneezing.   Eyes: Negative.   Respiratory: Negative for cough, chest tightness and shortness of breath.   Cardiovascular: Negative for chest pain and leg swelling.  Gastrointestinal: Positive for abdominal pain, diarrhea, nausea and vomiting. Negative for blood in stool.  Genitourinary: Negative for difficulty urinating, flank pain, frequency and hematuria.  Musculoskeletal: Positive for myalgias. Negative for arthralgias and back pain.  Skin: Negative for rash.  Neurological: Negative for dizziness, speech difficulty, weakness, numbness and headaches.     Physical Exam Updated Vital Signs BP 118/61 (BP Location: Left Arm)   Pulse 88   Temp 98.7 F (37.1 C) (Oral)   Resp 16   Ht  (1.651 m)   Wt 157 lb (71.2 kg)   SpO2 100%   BMI 26.13 kg/m   Physical Exam  Constitutional: She is oriented to person, place, and time. She appears well-developed and well-nourished.  HENT:  Head: Normocephalic and atraumatic.  Eyes: Pupils are equal, round, and reactive to light.  Neck: Normal range of motion. Neck supple.  Cardiovascular: Normal rate, regular rhythm and normal heart sounds.   Pulmonary/Chest: Effort normal and breath sounds normal. No respiratory distress. She has no wheezes. She has no rales. She exhibits no tenderness.  Abdominal: Soft. Bowel sounds are normal. There is no tenderness. There is no rebound and no guarding.  Musculoskeletal: Normal range of motion. She exhibits no edema.  Lymphadenopathy:    She has no cervical adenopathy.  Neurological: She  is alert and oriented to person, place, and time.  Skin: Skin is warm and dry. No rash noted.  Psychiatric: She has a normal mood and affect.   ED Treatments / Results  DIAGNOSTIC STUDIES:  Oxygen Saturation is 100% on RA, normal by my interpretation.    COORDINATION OF CARE:  7:45 PM Discussed treatment plan with pt at bedside which includes blood work, stool sample and IV fluids and pt agreed to plan.  Labs (all labs ordered are listed, but only abnormal results are displayed) Labs Reviewed  URINALYSIS, ROUTINE W REFLEX MICROSCOPIC - Abnormal; Notable for the following:       Result Value   Color, Urine AMBER (*)    APPearance TURBID (*)    Ketones, ur 15 (*)    Protein, ur 30 (*)    Leukocytes, UA MODERATE (*)    All other components  within normal limits  COMPREHENSIVE METABOLIC PANEL - Abnormal; Notable for the following:    Glucose, Bld 105 (*)    Calcium 8.3 (*)    Total Protein 6.3 (*)    ALT 13 (*)    All other components within normal limits  CBC WITH DIFFERENTIAL/PLATELET - Abnormal; Notable for the following:    Platelets 141 (*)    Neutro Abs 8.0 (*)    Lymphs Abs 0.6 (*)    All other components within normal limits  URINALYSIS, MICROSCOPIC (REFLEX) - Abnormal; Notable for the following:    Bacteria, UA MANY (*)    Squamous Epithelial / LPF 0-5 (*)    All other components within normal limits  C DIFFICILE QUICK SCREEN W PCR REFLEX  URINE CULTURE    EKG  EKG Interpretation None       Radiology No results found.  Procedures Procedures (including critical care time)  Medications Ordered in ED Medications  ondansetron (ZOFRAN-ODT) disintegrating tablet 4 mg (4 mg Oral Given 11/09/16 1733)  sodium chloride 0.9 % bolus 1,000 mL (0 mLs Intravenous Stopped 11/09/16 2102)     Initial Impression / Assessment and Plan / ED Course  I have reviewed the triage vital signs and the nursing notes.  Pertinent labs & imaging results that were available during  my care of the patient were reviewed by me and considered in my medical decision making (see chart for details).     Patient presents with myalgias, chills and nausea with diarrhea. She has no abdominal pain on exam. I feel that this is likely viral in nature rather than a complication from the colonoscopy. Her urine does have some signs of infection she was just treated for urinary tract infection and finished a course of antibiotics yesterday. She was having signs of a UTI with burning on urination which has cleared up after the antibiotics. I sent her urine for culture but this point I don't feel that it would be prudent to start more antibiotics. Her stool was sent for C. difficile testing. She was advised in symptomatic care and she is a clear liquid diet for the next 24 hours. Return precautions were given.  Final Clinical Impressions(s) / ED Diagnoses   Final diagnoses:  Gastroenteritis    New Prescriptions New Prescriptions   ONDANSETRON (ZOFRAN ODT) 4 MG DISINTEGRATING TABLET     ODT q4 hours prn nausea/vomit  I personally performed the services described in this documentation, which was scribed in my presence.  The recorded information has been reviewed and considered.     Rolan Bucco, MD 11/09/16 [phone]

## 2016-11-09 NOTE — ED Notes (Signed)
Pt given urine cup to provide urine sample.

## 2016-11-09 NOTE — ED Notes (Signed)
Pt ambulatory back to room, steady gait, states, "ready to go, feel about the same, nausea better". Stool sent to lab. EDP into room, to assess/update pt.

## 2016-11-10 ENCOUNTER — Telehealth (HOSPITAL_BASED_OUTPATIENT_CLINIC_OR_DEPARTMENT_OTHER): Payer: Self-pay | Admitting: *Deleted

## 2016-11-10 LAB — C DIFFICILE QUICK SCREEN W PCR REFLEX
C Diff antigen: POSITIVE — AB
C Diff interpretation: DETECTED
C Diff toxin: POSITIVE — AB

## 2016-11-11 LAB — URINE CULTURE: Culture: <10000 — AB

## 2017-04-01 ENCOUNTER — Encounter (HOSPITAL_BASED_OUTPATIENT_CLINIC_OR_DEPARTMENT_OTHER): Payer: Self-pay | Admitting: *Deleted

## 2017-04-01 ENCOUNTER — Emergency Department (HOSPITAL_BASED_OUTPATIENT_CLINIC_OR_DEPARTMENT_OTHER)
Admission: EM | Admit: 2017-04-01 | Discharge: 2017-04-01 | Disposition: A | Discharge status: Home / Self Care | Payer: Medicare Other | Attending: Emergency Medicine | Admitting: Emergency Medicine

## 2017-04-01 DIAGNOSIS — T7840XA Allergy, unspecified, initial encounter: Secondary | ICD-10-CM

## 2017-04-01 DIAGNOSIS — Y929 Unspecified place or not applicable: Secondary | ICD-10-CM | POA: Insufficient documentation

## 2017-04-01 DIAGNOSIS — I5032 Chronic diastolic (congestive) heart failure: Secondary | ICD-10-CM | POA: Insufficient documentation

## 2017-04-01 DIAGNOSIS — Z87891 Personal history of nicotine dependence: Secondary | ICD-10-CM | POA: Diagnosis not present

## 2017-04-01 DIAGNOSIS — Z7982 Long term (current) use of aspirin: Secondary | ICD-10-CM | POA: Diagnosis not present

## 2017-04-01 DIAGNOSIS — T63441A Toxic effect of venom of bees, accidental (unintentional), initial encounter: Secondary | ICD-10-CM | POA: Diagnosis not present

## 2017-04-01 DIAGNOSIS — Z79899 Other long term (current) drug therapy: Secondary | ICD-10-CM | POA: Diagnosis not present

## 2017-04-01 MED ORDER — FAMOTIDINE 20 MG PO TABS
20.0000 mg | ORAL_TABLET | Freq: Once | ORAL | Status: AC
  Administered 2017-04-01: 20 mg via ORAL
  Filled 2017-04-01: qty 1

## 2017-04-01 MED ORDER — PREDNISONE 50 MG PO TABS
60.0000 mg | ORAL_TABLET | Freq: Once | ORAL | Status: AC
  Administered 2017-04-01: 13:00:00 60 mg via ORAL
  Filled 2017-04-01: qty 1

## 2017-04-01 MED ORDER — EPINEPHRINE 0.3 MG/0.3ML IJ SOAJ: 0.3000 mg | Freq: Once | INTRAMUSCULAR | 0 refills | Status: DC | PRN

## 2017-04-01 MED ORDER — PREDNISONE 20 MG PO TABS: ORAL_TABLET | ORAL | 0 refills | Status: DC

## 2017-04-01 MED FILL — predniSONE 20 MG TABS: 20 | 6 days supply | Qty: 12 | Fill #0

## 2017-04-01 MED FILL — EPINEPHRINE 0.3 MG AUTO-INJ: 0.3 | 20 days supply | Qty: 2 | Fill #0

## 2017-04-01 NOTE — ED Triage Notes (Signed)
Wasp bite to her right arm 30 minutes ago. She used her epi pen and took Benadryl 50mg .

## 2017-04-01 NOTE — ED Provider Notes (Signed)
[facility]   CSN: [id number] Arrival date & time: 04/01/17  1244     History   Chief Complaint Chief Complaint  Patient presents with  . Insect Bite    HPI Linda Lee is a 81 y.o. female history of reflux, previous anaphylaxis to bee sting here presenting with possible anaphylaxis. Patient was bitten by a wasp around noon today. She then noticed swelling in that area and had some tingling in her lips so she gave herself an EpiPen injection and took 50 mg of Benadryl right afterwards. Had palpitations since then but denies any chest pain or shortness of breath or throat closing. States that she is feeling better now. She had previous anaphylaxis to bee sting.   The history is provided by the patient.    Past Medical History:  Diagnosis Date  . Anginal pain (HCC)   . Angio-edema   . Arthritis   . GERD (gastroesophageal reflux disease)   . Hyperlipemia   . Migraines     Patient Active Problem List   Diagnosis Date Noted  . OA (osteoarthritis) of knee 09/25/2015  . UTI (lower urinary tract infection) 09/04/2012  . Diastolic CHF, chronic (HCC) 09/04/2012  . Subjective visual disturbance, right eye 09/03/2012  . Numbness around mouth 09/03/2012  . Vision disturbance 09/03/2012  . THORACIC AORTIC ANEURYSM 07/06/2009  . CHEST PAIN UNSPECIFIED 07/06/2009  . HYPERLIPIDEMIA 06/30/2009  . GERD 06/30/2009  . LEG CRAMPS 06/30/2009    Past Surgical History:  Procedure Laterality Date  . ADENOIDECTOMY    . BACK SURGERY    . HEMORRHOID SURGERY    . 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    . TONSILLECTOMY    . TOTAL KNEE ARTHROPLASTY Right 09/25/2015   Procedure: TOTAL KNEE ARTHROPLASTY;  Surgeon: Ollen Gross, MD;  Location: [facility];  Service: Orthopedics;  Laterality: Right;    OB History    No data available       Home Medications    Prior to Admission medications   Medication Sig Start Date End Date Taking? Authorizing Provider    aspirin EC 325 MG tablet Take 325 mg by mouth daily.    [provider]  atorvastatin (LIPITOR) 20 MG tablet Take 20 mg by mouth 3 (three) times a week. Monday    [provider]  CALCIUM PO Take 1,000 mg by mouth daily.    [provider]  Coenzyme Q10 300 MG CAPS Take 1 capsule by mouth. Tuesday, Thursday    [provider]  Cyanocobalamin (VITAMIN B-12 PO) Take 2,500 mg by mouth daily.    [provider]  EPINEPHrine 0.3 mg/0.3 mL IJ SOAJ injection Inject 0.3 mg into the muscle once. 08/20/16   [provider]  Ergocalciferol (VITAMIN D2) 2000 units TABS Take 1 tablet by mouth daily.    [provider]  estradiol (ESTRACE) 0.1 MG/GM vaginal cream Place 1 Applicatorful vaginally once a week.    [provider]  furosemide (LASIX) 20 MG tablet Take 20 mg by mouth every morning.     [provider]  omeprazole (PRILOSEC OTC) 20 MG tablet Take 20 mg by mouth daily as needed (acid reflux).    [provider]  ondansetron (ZOFRAN ODT) 4 MG disintegrating tablet 4mg  ODT q4 hours prn nausea/vomit 11/09/16   Rolan Bucco, MD  potassium chloride (K-DUR,KLOR-CON) 10 MEQ tablet Take 10 mEq by mouth every morning.     [provider]  UNABLE TO FIND  Apply 1 application topically as needed. Protozone-HC 2.5 cream    [provider]    Family History Family History  Problem Relation Age of Onset  . Allergic rhinitis Neg Hx   . Angioedema Neg Hx   . Asthma Neg Hx   . Eczema Neg Hx   . Immunodeficiency Neg Hx   . Urticaria Neg Hx     Social History Social History  Substance Use Topics  . Smoking status: Former Smoker    Quit date: 08/15/1963  . Smokeless tobacco: Never Used  . Alcohol use 0.6 oz/week    1 Glasses of wine per week     Comment: almost every night     Allergies   Wasp venom and Fluconazole   Review of Systems Review of Systems  Skin: Positive for rash.  All other  systems reviewed and are negative.    Physical Exam Updated Vital Signs BP (!) 116/54   Pulse 73   Temp 98.1 F (36.7 C) (Oral)   Resp 20   Ht 5\' 5"  (1.651 m)   Wt 68.9 kg (152 lb)   SpO2 99%   BMI 25.29 kg/m   Physical Exam  Constitutional: She is oriented to person, place, and time. She appears well-developed.  HENT:  Head: Normocephalic.  Mouth/Throat: Oropharynx is clear and moist.  OP clear   Eyes: Pupils are equal, round, and reactive to light. Conjunctivae and EOM are normal.  Neck: Normal range of motion. Neck supple.  Cardiovascular: Normal rate, regular rhythm and normal heart sounds.   Pulmonary/Chest: Effort normal and breath sounds normal. No respiratory distress. She has no wheezes.  Abdominal: Soft. Bowel sounds are normal. She exhibits no distension. There is no tenderness. There is no guarding.  Musculoskeletal: Normal range of motion.  Neurological: She is alert and oriented to person, place, and time. No cranial nerve deficit. Coordination normal.  Skin: Skin is warm.  Psychiatric: She has a normal mood and affect.  Rash R forearm from the wasp sting, no obvious cellulitis   Nursing note and vitals reviewed.    ED Treatments / Results  Labs (all labs ordered are listed, but only abnormal results are displayed) Labs Reviewed - No data to display  EKG  EKG Interpretation None       Radiology No results found.  Procedures Procedures (including critical care time)  Medications Ordered in ED Medications  predniSONE (DELTASONE) tablet 60 mg (60 mg Oral Given 04/01/17 1328)  famotidine (PEPCID) tablet 20 mg (20 mg Oral Given 04/01/17 1328)     Initial Impression / Assessment and Plan / ED Course  I have reviewed the triage vital signs and the nursing notes.  Pertinent labs & imaging results that were available during my care of the patient were reviewed by me and considered in my medical decision making (see chart for details).     Linda Lee is a 81 y.o. female here with reaction to wasp sting. Given benadryl and epi pen prior to arrival. Will give prednisone, pepcid and observe for 3 hrs after epi.   3:16 PM  Observed for 3 hrs after epi. OP remained clear. Lungs clear. Will dc home with steroids, refill epi pen.   Final Clinical Impressions(s) / ED Diagnoses   Final diagnoses:  None    New Prescriptions New Prescriptions   No medications on file     Charlynne Pander, MD 04/01/17 [phone]

## 2017-04-01 NOTE — Discharge Instructions (Signed)
Take prednisone as prescribed.   Take benadryl 25 mg every 6 hrs as needed for itchiness.   Give epi pen if you have trouble breathing, shortness of breath then come to the ED.   See your doctor  Return to ER if you have trouble breathing, shortness of breath, worse rash.

## 2017-05-10 ENCOUNTER — Other Ambulatory Visit: Payer: Self-pay | Admitting: Internal Medicine

## 2017-05-10 ENCOUNTER — Ambulatory Visit (HOSPITAL_COMMUNITY)
Admission: RE | Admit: 2017-05-10 | Discharge: 2017-05-10 | Disposition: A | Discharge status: Home / Self Care | Payer: Medicare Other | Source: Ambulatory Visit | Attending: Internal Medicine | Admitting: Internal Medicine

## 2017-05-10 ENCOUNTER — Other Ambulatory Visit (HOSPITAL_COMMUNITY): Payer: Self-pay | Admitting: Internal Medicine

## 2017-05-10 DIAGNOSIS — I712 Thoracic aortic aneurysm, without rupture: Secondary | ICD-10-CM | POA: Diagnosis not present

## 2017-05-10 DIAGNOSIS — R7989 Other specified abnormal findings of blood chemistry: Secondary | ICD-10-CM | POA: Diagnosis not present

## 2017-05-10 DIAGNOSIS — R0602 Shortness of breath: Secondary | ICD-10-CM | POA: Diagnosis present

## 2017-05-10 DIAGNOSIS — I700 Atherosclerosis of aorta: Secondary | ICD-10-CM | POA: Diagnosis not present

## 2017-05-10 LAB — POCT I-STAT CREATININE: Creatinine, Ser: 0.8 mg/dL (ref 0.44–1.00)

## 2017-05-10 MED ORDER — IOPAMIDOL (ISOVUE-370) INJECTION 76%
100.0000 mL | Freq: Once | INTRAVENOUS | Status: AC | PRN
  Administered 2017-05-10: 80 mL via INTRAVENOUS

## 2017-06-03 ENCOUNTER — Encounter: Payer: Self-pay | Admitting: Pediatrics

## 2017-06-03 ENCOUNTER — Ambulatory Visit (INDEPENDENT_AMBULATORY_CARE_PROVIDER_SITE_OTHER): Payer: Medicare Other | Admitting: Pediatrics

## 2017-06-03 VITALS — BP 124/60 | HR 70 | Temp 97.7°F | Resp 16 | Ht 64.37 in | Wt 155.4 lb

## 2017-06-03 DIAGNOSIS — T63441D Toxic effect of venom of bees, accidental (unintentional), subsequent encounter: Secondary | ICD-10-CM

## 2017-06-03 DIAGNOSIS — T63441A Toxic effect of venom of bees, accidental (unintentional), initial encounter: Secondary | ICD-10-CM

## 2017-06-03 HISTORY — DX: Toxic effect of venom of bees, accidental (unintentional), initial encounter: T63.441A

## 2017-06-03 NOTE — Patient Instructions (Signed)
You are very allergic to wasp, hornets and yellow jackets. You  will have injections to wasp venom in one vial and yellow jacket and hornets in another vial If you have an  insect sting take Benadryl 50 mg every 4 hours and if you have life-threatening symptoms inject with EpiPen 0.3 mg

## 2017-06-03 NOTE — Progress Notes (Signed)
  [address] [place] [address] Dept: [phone]  FOLLOW UP NOTE  Patient ID: Linda Lee, female    DOB: 1936-07-21  Age: 81 y.o. MRN: [id number] Date of Office Visit: 06/03/2017  Assessment  Chief Complaint: Allergic Reaction (insect sting)  HPI Linda PlowmanHelen M Lee presents for skin testing to Hymenoptera venoms. On 08/18/2016 she was stung by a wasp in the right hand. She took Benadryl. She became very dizzy, nauseated and had generalized hives and felt that she was going to pass out. She went to the emergency room for evaluation. In August 2018 she was stung once by wasp in her right arm and had large swelling , nausea and dizziness. She used EpiPen and her symptoms resolved .   Drug Allergies:  Allergies  Allergen Reactions  . Wasp Venom Other (See Comments)    dizziness  . Fluconazole Rash    Physical Exam: BP 124/60 (BP Location: Left Arm, Patient Position: Sitting, Cuff Size: Normal)   Pulse 70   Temp 97.7 F (36.5 C) (Oral)   Resp 16   Ht 5' 4.37" (1.635 m)   Wt 155 lb 6.4 oz (70.5 kg)   SpO2 98%   BMI 26.37 kg/m    Physical Exam  Constitutional: She is oriented to person, place, and time. She appears well-developed and well-nourished.  HENT:  Eyes normal. Ears normal. Nose normal. Pharynx normal.  Neck: Neck supple.  Cardiovascular:  S1 and S2 normal no murmurs  Pulmonary/Chest:  Clear to percussion and auscultation  Lymphadenopathy:    She has no cervical adenopathy.  Neurological: She is alert and oriented to person, place, and time.  Skin:  Clear  Psychiatric: She has a normal mood and affect. Her behavior is normal. Judgment and thought content normal.  Vitals reviewed.   Diagnostics:  Allergy skin test to Hymenoptera venom were positive to yellow jacket , yellow hornet ,  white hornet and wasp at a 0.01 g per mL concentration  Assessment and Plan: 1. Toxic effect of venom of bees, unintentional, subsequent encounter         Patient Instructions  You are very allergic to wasp, hornets and yellow jackets. You  will have injections to wasp venom in one vial and yellow jacket and hornets in another vial If you have an  insect sting take Benadryl 50 mg every 4 hours and if you have life-threatening symptoms inject with EpiPen 0.3 mg   Return in about 2 weeks (around 06/17/2017).    Thank you for the opportunity to care for this patient.  Please do not hesitate to contact me with questions.  Tonette BihariJ. A. Kennisha Qin, M.D.  [facility] [address] [place], [place] [address] [phone]

## 2017-06-16 ENCOUNTER — Ambulatory Visit: Payer: Self-pay

## 2017-06-16 NOTE — Progress Notes (Addendum)
Linda Lee 05/19/2017 9:30 AM Location: [facility] Patient #: [id number] DOB: 04/19/1936 Married / Language: English / Race: White Female   History of Present Illness (Jagadeesh R. Grey Rakestraw MD; 05/19/2017 10:42 AM) Patient words: NP Consult by dr. Perini for EVAL DOE, CP, pt was here in 2014.  The patient is a 81 year old female who presents for shortness of breath.  Additional reasons for visit:  Chest pain at rest is described as the following: Fairly active Caucasian female referred to me by Dr. Perini for evaluation of recent onset of chest discomfort and shortness of breath. Chest pain is described as tightness in the middle of the chest that comes with exertional activities, lasts for about 10-15 minutes after she rests subsides spontaneously sometimes radiates to the back. Chest pain is brought on by moderate amount of exertion activity and has been gradually getting worse over the past 3-4 weeks. Started a few months ago. No other associated symptoms except marked dyspnea with exertional activity.  She also complains of shortness of breath and dyspnea on exertion. Symptoms started years ago, however in the past 6 months to year she had noticed worsening, especially in the past one month symptoms are very worse to a point of minimal activities around the house brings on dyspnea and is associated with sometimes chest tightness.  She has history of GERD, she also gets chest pain when she lays down at rest but is different from exertional chest pain. She has chronic mild leg edema and takes furosemide. Denies PND or orthopnea. She complains of night cramps. Her past medical history is also significant for known small sized ascending aortic aneurysm by CT scan in 2017, hyperlipidemia, chronic mild leg edema.   Problem List/Past Medical (Jagadeesh R. Rainah Kirshner, MD; 05/19/2017 10:15 AM) Laboratory examination (Z01.89)  Thoracic aortic ectasia (I77.810)  Hyperlipemia  (E78.5)  Chest pain at rest (R07.9)  EKG 10/07/2012 NSR @ 58/min. NOrmal intervals. No ischemia. Normal EKG.  Echocardiogram 09/04/2012: Normal left ventricle systolic function without evidence of any diastolic heart failure. Normal echocardiogram.  09/04/2012: Total cholesterol 136, triglycerides 30, HDL 67, LDL 63.  Allergies (Jennifer Sergeant; 05/19/2017 9:36 AM) Diflucan *ANTIFUNGALS*  Rash.  Family History (Jennifer Sergeant; 05/19/2017 9:37 AM) Mother  Deceased. At age 83 (unknown death) Father  Deceased. At age 70 from prostate cancer Sister 1  older, no heart issues  Social History (Jennifer Sergeant; 05/19/2017 9:38 AM) Current tobacco use  Former smoker. Quit smoking in 1965 Alcohol Use  Occasional alcohol use. Marital status  Married. Number of Children  3. Living Situation  Lives with spouse.  Past Surgical History (Jennifer Sergeant; 05/19/2017 9:39 AM) Back Surgery [1997]: Tonsillectomy [1945]: Total Knee Replacement - Right [2017]:  Medication History (Jennifer Sergeant; 05/19/2017 9:47 AM) Omeprazole (20MG Tablet DR, 1 Oral as needed) Active. Estrace (0.1MG/GM Cream, apply Vaginal weekly) Active. Protozone-HC 2.5% cream (as needed) Active. Vitamin D (2000UNIT Capsule, 1 Oral daily) Active. Stool Softener (100MG Capsule, 1 Oral daily) Active. Aspirin (81MG Tablet, 1 Oral daily) Active. Furosemide (20MG Tablet, 1 Oral daily) Active. Atorvastatin Calcium (20MG Tablet, 1 Oral Mondays) Active. Cranberry (300MG Tablet, 1 Oral daily) Active. Klor-Con 10 (10MEQ Tablet ER, 1 Oral daily) Active. Vitamin B12 (100MCG Tablet, 1 Oral daily) Active. Calcium Gluconate (500MG Tablet, 1 Oral daily) Active. Florastor (1 Oral two times daily) Specific strength unknown - Active. EPINEPHrine (0.3MG/0.3ML Soln Auto-inj, Injection as needed) Active. Medications Reconciled (med list)  Diagnostic Studies History (Jagadeesh R. Caileb Rhue, MD; 05/19/2017 10:51    AM) Echocardiogram [09/04/2012]: AT [facility]: Echocardiogram: Normal left ventricle systolic function without evidence of any diastolic heart failure. Normal echocardiogram. Colonoscopy [2013]: Normal. CT Scan of Chest [08/14/2016]: CT angiogram chest 08/14/2016: Compared to 01/04/2016: No significant change change in the size of 4.3 cm asymptomatic ascending aortic aneurysm without completing features. Dilated Central pulmonary arteries.  Other Problems (Jagadeesh R. Miakoda Mcmillion, MD; 05/19/2017 10:15 AM) Was admitted to [facility] for vison problems [09/03/2012]:    Review of Systems (Jagadeesh R. Kiwan Gadsden MD; 05/19/2017 10:49 AM) General Present- Feeling well. Not Present- Tiredness and Unable to Sleep Lying Flat. HEENT Not Present- Blurred Vision. Respiratory Present- Decreased Exercise Tolerance and Difficulty Breathing on Exertion. Not Present- Bloody sputum and Wakes up from Sleep Wheezing or Short of Breath. Cardiovascular Present- Chest Pain, Edema and Night Cramps. Not Present- Leg Cramps, Palpitations and Paroxysmal Nocturnal Dyspnea. Gastrointestinal Present- Heartburn. Not Present- Black, Tarry Stool and Bloody Stool. Musculoskeletal Not Present- Claudication and Joint Pain. Neurological Not Present- Focal Neurological Symptoms. Hematology Not Present- Blood Clots, Easy Bruising and Nose Bleed. All other systems negative  Vitals (Jennifer Sergeant; 05/19/2017 9:36 AM) 05/19/2017 9:33 AM Weight: 152.56 lb Height: 65in Body Surface Area: 1.76 m Body Mass Index: 25.39 kg/m  Pulse: 68 (Regular)  P.OX: 98% (Room air) BP: 108/72 (Sitting, Left Arm, Standard)  Physical Exam (Jagadeesh R. Worley Radermacher MD; 05/19/2017 10:48 AM) General Mental Status-Alert. General Appearance-Cooperative and Appears stated age. Build & Nutrition-Well nourished and Moderately built.  Head and Neck Thyroid Gland Characteristics - normal size and consistency and no palpable nodules.  Chest  and Lung Exam Chest and lung exam reveals -quiet, even and easy respiratory effort with no use of accessory muscles, non-tender and on auscultation, normal breath sounds, no adventitious sounds.  Cardiovascular Cardiovascular examination reveals -normal heart sounds, regular rate and rhythm with no murmurs.  Abdomen Palpation/Percussion Palpation and Percussion of the abdomen reveal - Non Tender and No hepatosplenomegaly.  Peripheral Vascular Lower Extremity Inspection - Bilateral - Inspection Normal. Palpation - Edema - Bilateral - Trace edema(large adipose tissue present). Femoral pulse - Bilateral - Normal. Popliteal pulse - Bilateral - Normal. Dorsalis pedis pulse - Left - Feeble. Right - Normal. Posterior tibial pulse - Bilateral - Feeble. Carotid arteries - Bilateral-No Carotid bruit. Abdomen-No prominent abdominal aortic pulsation.  Neurologic Neurologic evaluation reveals -alert and oriented x 3 with no impairment of recent or remote memory. Motor-Grossly intact without any focal deficits.  Musculoskeletal Global Assessment Left Lower Extremity - no deformities, masses or tenderness, no known fractures. Right Lower Extremity - no deformities, masses or tenderness, no known fractures.    Assessment & Plan (Jagadeesh R. Zoei Amison MD; 05/19/2017 10:54 AM) Dyspnea on exertion (R06.09) Future Plans 05/23/2017: Echocardiography, transthoracic, real-time with image documentation (2D), includes M-mode recording, when performed, complete, with spectral Doppler echocardiography, and with color flow Doppler echocardiography (93306) - one time Exertional chest pain (R07.9) Story: EKG 05/19/2017: Sinus bradycardia at rate of 59 bpm, normal axis, no evidence of ischemia, normal EKG  Echocardiogram 09/04/2012: Normal left ventricle systolic function without evidence of any diastolic heart failure. Normal echocardiogram. Current Plans Complete electrocardiogram (93000) Future  Plans 06/09/2017: METABOLIC PANEL, BASIC (80048) - one time 06/09/2017: CBC & PLATELETS (AUTO) (85027) - one time 06/09/2017: PT (PROTHROMBIN TIME) (85610) - one time Pure hypercholesterolemia (E78.00) Ascending aortic aneurysm (I71.2) Story: CT angiogram chest 08/14/2016: Compared to 01/04/2016: No significant change change in the size of 4.3 cm asymptomatic ascending aortic aneurysm without completing features. Dilated Central   pulmonary arteries. Diagnosed with ectatic thoracic aorta in 2010. Laboratory examination (Z01.89) Story: Labs 01/17/2017: Serum glucose 87 mg, BUN 16, creatinine 0.8, eGFR 69 mL, potassium 4, CMP normal. HB 2.9/HCT 38.4, radiates 144. Total cholesterol 181, triglycerides 40, HDL 62, LDL 111. Non-HDL cholesterol 119. TSH normal. Vitamin D 43.2.  09/04/2012: Total cholesterol 136, triglycerides 30, HDL 67, LDL 63.  Note:-  Recommendations:  Patient is referred to me for evaluation of worsening dyspnea on exertion over the past several years, worse over the last 2 months or so, exertional chest tightness with radiation to the back abnormal CT scan showing stable mild ascending thoracic aortic aneurysm.  Patient has no significant cardiovascular risk factors however I'm concerned about abnormal CT findings that show pulmonary artery dilatation, in view of dyspnea on exertion, I set up for a echocardiogram to evaluate for right heart strain and she'll also need right heart catheterization to confirm diagnosis or to exclude pulmonary hypertension. She does have night cramps, physical exam is mildly abnormal with left pedal pulses mildly reduced but otherwise intact vascular exam. I may consider ABI in future.  Also in view of chest pain, as I will be proceeding with right heart catheterization, we will also proceed with left heart catheterization. Schedule for cardiac catheterization, and possible angioplasty. We discussed regarding risks, benefits, alternatives to this including  stress testing, CTA and continued medical therapy. Patient wants to proceed. Understands <1-2% risk of death, stroke, MI, urgent CABG, bleeding, infection, renal failure but not limited to these.  CC Dr. Mark Perini. Addendum Note(Jagadeesh R. Leam Madero MD; 06/16/2017 10:13 AM) Labs 06/13/2017: Serum glucose 100 mg, BUN 25, creatinine 0.96, eGFR 57 mL, potassium 4.0. HB 4.8/HCT 30 9.8, platelets 176. Pro time normal.  Labs 01/17/2017: Serum glucose 87 mg, BUN 16, creatinine 0.8, eGFR 69 mL, potassium 4, CMP normal. HB 2.9/HCT 38.4, radiates 144. Total cholesterol 181, triglycerides 40, HDL 62, LDL 111. Non-HDL cholesterol 119. TSH normal. Vitamin D 43.2.  Signed by Jagadeesh R Karmah Potocki, MD (05/19/2017 10:55 AM) 

## 2017-06-16 NOTE — H&P (View-Only) (Signed)
Linda Lee 05/20/2017 9:30 AM Location: [facility] Patient #: [id number] DOB: 1936-04-08 Married / Language: Cleophus Molt / Race: White Female   History of Present Illness Laverda Page MD; May 20, 2017 10:42 AM) Patient words: NP Consult by dr. Joylene Draft for EVAL DOE, CP, pt was here in 2014.  The patient is a 81 year old female who presents for shortness of breath.  Additional reasons for visit:  Chest pain at rest is described as the following: Fairly active Caucasian female referred to me by Dr. Joylene Draft for evaluation of recent onset of chest discomfort and shortness of breath. Chest pain is described as tightness in the middle of the chest that comes with exertional activities, lasts for about 10-15 minutes after she rests subsides spontaneously sometimes radiates to the back. Chest pain is brought on by moderate amount of exertion activity and has been gradually getting worse over the past 3-4 weeks. Started a few months ago. No other associated symptoms except marked dyspnea with exertional activity.  She also complains of shortness of breath and dyspnea on exertion. Symptoms started years ago, however in the past 6 months to year she had noticed worsening, especially in the past one month symptoms are very worse to a point of minimal activities around the house brings on dyspnea and is associated with sometimes chest tightness.  She has history of GERD, she also gets chest pain when she lays down at rest but is different from exertional chest pain. She has chronic mild leg edema and takes furosemide. Denies PND or orthopnea. She complains of night cramps. Her past medical history is also significant for known small sized ascending aortic aneurysm by CT scan in 2017, hyperlipidemia, chronic mild leg edema.   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 NOrmal intervals. No ischemia. Normal EKG.  Echocardiogram 09/04/2012: Normal left ventricle systolic function without evidence of any diastolic heart failure. Normal echocardiogram.  09/04/2012: Total cholesterol 136, triglycerides 30, HDL 67, LDL 63.  Allergies Anderson Malta Sergeant; 05-20-2017 9:36 AM) Diflucan *ANTIFUNGALS*  Rash.  Family History Anderson Malta Sergeant; 20-May-2017 9:37 AM) Mother  Deceased. At age 69 (unknown death) Father  Deceased. At age 79 from prostate cancer Sister 1  older, no heart issues  Social History Anderson Malta Sergeant; 2017/05/20 9:38 AM) Current tobacco use  Former smoker. Quit smoking in 1965 Alcohol Use  Occasional alcohol use. Marital status  Married. Number of Children  3. Living Situation  Lives with spouse.  Past Surgical History Anderson Malta Sergeant; May 20, 2017 9:39 AM) Back Surgery [1997]: Tonsillectomy [1945]: Total Knee Replacement - Right [2017]:  Medication History Anderson Malta Sergeant; May 20, 2017 9:47 AM) Omeprazole (20MG Tablet DR, 1 Oral as needed) Active. Estrace (0.1MG/GM Cream, apply Vaginal weekly) Active. Protozone-HC 2.5% cream (as needed) Active. Vitamin D (2000UNIT Capsule, 1 Oral daily) Active. Stool Softener (100MG Capsule, 1 Oral daily) Active. Aspirin (81MG Tablet, 1 Oral daily) Active. Furosemide (20MG Tablet, 1 Oral daily) Active. Atorvastatin Calcium (20MG Tablet, 1 Oral Mondays) Active. Cranberry (300MG Tablet, 1 Oral daily) Active. Klor-Con 10 (10MEQ Tablet ER, 1 Oral daily) Active. Vitamin B12 (100MCG Tablet, 1 Oral daily) Active. Calcium Gluconate (500MG Tablet, 1 Oral daily) Active. Florastor (1 Oral two times daily) Specific strength unknown - Active. EPINEPHrine (0.3MG/0.3ML Soln Auto-inj, Injection as needed) Active. Medications Reconciled (med list)  Diagnostic Studies History Laverda Page, MD; May 20, 2017 10:51  AM) Echocardiogram [09/04/2012]: AT [facility]: Echocardiogram: Normal left ventricle systolic function without evidence of any diastolic heart failure. Normal echocardiogram. Colonoscopy [2013]: Normal. CT Scan of Chest [08/14/2016]: CT angiogram chest 08/14/2016: Compared to 01/04/2016: No significant change change in the size of 4.3 cm asymptomatic ascending aortic aneurysm without completing features. Dilated Central pulmonary arteries.  Other Problems Laverda Page, MD; 05/19/2017 10:15 AM) Was admitted to [place] for vison problems [09/03/2012]:    Review of Systems Laverda Page MD; 05/19/2017 10:49 AM) General Present- Feeling well. Not Present- Tiredness and Unable to Sleep Lying Flat. HEENT Not Present- Blurred Vision. Respiratory Present- Decreased Exercise Tolerance and Difficulty Breathing on Exertion. Not Present- Bloody sputum and Wakes up from Sleep Wheezing or Short of Breath. Cardiovascular Present- Chest Pain, Edema and Night Cramps. Not Present- Leg Cramps, Palpitations and Paroxysmal Nocturnal Dyspnea. Gastrointestinal Present- Heartburn. Not Present- Black, Tarry Stool and Bloody Stool. Musculoskeletal Not Present- Claudication and Joint Pain. Neurological Not Present- Focal Neurological Symptoms. Hematology Not Present- Blood Clots, Easy Bruising and Nose Bleed. All other systems negative  Vitals Anderson Malta Sergeant; 05/19/2017 9:36 AM) 05/19/2017 9:33 AM Weight: 152.56 lb Height: 65in Body Surface Area: 1.76 m Body Mass Index: 25.39 kg/m  Pulse: 68 (Regular)  P.OX: 98% (Room air) BP: 108/72 (Sitting, Left Arm, Standard)  Physical Exam Laverda Page MD; 05/19/2017 10:48 AM) General Mental Status-Alert. General Appearance-Cooperative and Appears stated age. Build & Nutrition-Well nourished and Moderately built.  Head and Neck Thyroid Gland Characteristics - normal size and consistency and no palpable nodules.  Chest  and Lung Exam Chest and lung exam reveals -quiet, even and easy respiratory effort with no use of accessory muscles, non-tender and on auscultation, normal breath sounds, no adventitious sounds.  Cardiovascular Cardiovascular examination reveals -normal heart sounds, regular rate and rhythm with no murmurs.  Abdomen Palpation/Percussion Palpation and Percussion of the abdomen reveal - Non Tender and No hepatosplenomegaly.  Peripheral Vascular Lower Extremity Inspection - Bilateral - Inspection Normal. Palpation - Edema - Bilateral - Trace edema(large adipose tissue present). Femoral pulse - Bilateral - Normal. Popliteal pulse - Bilateral - Normal. Dorsalis pedis pulse - Left - Feeble. Right - Normal. Posterior tibial pulse - Bilateral - Feeble. Carotid arteries - Bilateral-No Carotid bruit. Abdomen-No prominent abdominal aortic pulsation.  Neurologic Neurologic evaluation reveals -alert and oriented x 3 with no impairment of recent or remote memory. Motor-Grossly intact without any focal deficits.  Musculoskeletal Global Assessment Left Lower Extremity - no deformities, masses or tenderness, no known fractures. Right Lower Extremity - no deformities, masses or tenderness, no known fractures.    Assessment & Plan Laverda Page MD; 05/19/2017 10:54 AM) Dyspnea on exertion (R06.09) Future Plans 05/23/2017: Echocardiography, transthoracic, real-time with image documentation (2D), includes M-mode recording, when performed, complete, with spectral Doppler echocardiography, and with color flow Doppler echocardiography (25852) - one time Exertional chest pain (R07.9) Story: EKG 05/19/2017: Sinus bradycardia at rate of 59 bpm, normal axis, no evidence of ischemia, normal EKG  Echocardiogram 09/04/2012: Normal left ventricle systolic function without evidence of any diastolic heart failure. Normal echocardiogram. Current Plans Complete electrocardiogram (93000) Future  Plans 77/03/2422: METABOLIC PANEL, BASIC (53614) - one time 06/09/2017: CBC & PLATELETS (AUTO) (43154) - one time 06/09/2017: PT (PROTHROMBIN TIME) (00867) - one time Pure hypercholesterolemia (E78.00) Ascending aortic aneurysm (I71.2) Story: CT angiogram chest 08/14/2016: Compared to 01/04/2016: No significant change change in the size of 4.3 cm asymptomatic ascending aortic aneurysm without completing features. Dilated Central  pulmonary arteries. Diagnosed with ectatic thoracic aorta in 2010. Laboratory examination (Z01.89) Story: Labs 01/17/2017: Serum glucose 87 mg, BUN 16, creatinine 0.8, eGFR 69 mL, potassium 4, CMP normal. HB 2.9/HCT 38.4, radiates 144. Total cholesterol 181, triglycerides 40, HDL 62, LDL 111. Non-HDL cholesterol 119. TSH normal. Vitamin D 43.2.  09/04/2012: Total cholesterol 136, triglycerides 30, HDL 67, LDL 63.  Note:-  Recommendations:  Patient is referred to me for evaluation of worsening dyspnea on exertion over the past several years, worse over the last 2 months or so, exertional chest tightness with radiation to the back abnormal CT scan showing stable mild ascending thoracic aortic aneurysm.  Patient has no significant cardiovascular risk factors however I'm concerned about abnormal CT findings that show pulmonary artery dilatation, in view of dyspnea on exertion, I set up for a echocardiogram to evaluate for right heart strain and she'll also need right heart catheterization to confirm diagnosis or to exclude pulmonary hypertension. She does have night cramps, physical exam is mildly abnormal with left pedal pulses mildly reduced but otherwise intact vascular exam. I may consider ABI in future.  Also in view of chest pain, as I will be proceeding with right heart catheterization, we will also proceed with left heart catheterization. Schedule for cardiac catheterization, and possible angioplasty. We discussed regarding risks, benefits, alternatives to this including  stress testing, CTA and continued medical therapy. Patient wants to proceed. Understands <1-2% risk of death, stroke, MI, urgent CABG, bleeding, infection, renal failure but not limited to these.  CC Dr. Crist Infante. Addendum Note(Jagadeesh Carlynn Herald MD; 06/16/2017 10:13 AM) Labs 06/13/2017: Serum glucose 100 mg, BUN 25, creatinine 0.96, eGFR 57 mL, potassium 4.0. HB 4.8/HCT 30 9.8, platelets 176. Pro time normal.  Labs 01/17/2017: Serum glucose 87 mg, BUN 16, creatinine 0.8, eGFR 69 mL, potassium 4, CMP normal. HB 2.9/HCT 38.4, radiates 144. Total cholesterol 181, triglycerides 40, HDL 62, LDL 111. Non-HDL cholesterol 119. TSH normal. Vitamin D 43.2.  Signed by Laverda Page, MD (05/19/2017 10:55 AM)

## 2017-06-16 NOTE — Progress Notes (Signed)
Labs 06/13/2017: Serum glucose 100 mg, BUN 25, creatinine 0.96, eGFR 57 mL, potassium 4.0.  Hb 12.8/HCT 39.8, platelets 176.  Pro time normal.

## 2017-06-17 ENCOUNTER — Encounter (HOSPITAL_COMMUNITY)
Admission: RE | Disposition: A | Discharge status: Home / Self Care | Payer: Self-pay | Source: Ambulatory Visit | Attending: Cardiology

## 2017-06-17 ENCOUNTER — Ambulatory Visit (HOSPITAL_COMMUNITY)
Admission: RE | Admit: 2017-06-17 | Discharge: 2017-06-17 | Disposition: A | Discharge status: Home / Self Care | Payer: Medicare Other | Source: Ambulatory Visit | Attending: Cardiology | Admitting: Cardiology

## 2017-06-17 DIAGNOSIS — R072 Precordial pain: Secondary | ICD-10-CM | POA: Insufficient documentation

## 2017-06-17 DIAGNOSIS — Z7982 Long term (current) use of aspirin: Secondary | ICD-10-CM | POA: Diagnosis not present

## 2017-06-17 DIAGNOSIS — Z87891 Personal history of nicotine dependence: Secondary | ICD-10-CM | POA: Diagnosis not present

## 2017-06-17 DIAGNOSIS — R0789 Other chest pain: Secondary | ICD-10-CM | POA: Diagnosis present

## 2017-06-17 DIAGNOSIS — R0609 Other forms of dyspnea: Secondary | ICD-10-CM

## 2017-06-17 DIAGNOSIS — E7800 Pure hypercholesterolemia, unspecified: Secondary | ICD-10-CM | POA: Diagnosis not present

## 2017-06-17 DIAGNOSIS — I712 Thoracic aortic aneurysm, without rupture: Secondary | ICD-10-CM | POA: Insufficient documentation

## 2017-06-17 HISTORY — PX: RIGHT/LEFT HEART CATH AND CORONARY ANGIOGRAPHY: CATH118266

## 2017-06-17 LAB — POCT I-STAT 3, ART BLOOD GAS (G3+)
Acid-Base Excess: 3 mmol/L — ABNORMAL HIGH (ref 0.0–2.0)
Bicarbonate: 24.9 mmol/L (ref 20.0–28.0)
Bicarbonate: 27 mmol/L (ref 20.0–28.0)
O2 Saturation: 71 %
O2 Saturation: 99 %
TCO2: 26 mmol/L (ref 22–32)
TCO2: 28 mmol/L (ref 22–32)
pCO2 arterial: 40 mmHg (ref 32.0–48.0)
pCO2 arterial: 40.7 mmHg (ref 32.0–48.0)
pH, Arterial: 7.396 (ref 7.350–7.450)
pH, Arterial: 7.438 (ref 7.350–7.450)
pO2, Arterial: 117 mmHg — ABNORMAL HIGH (ref 83.0–108.0)
pO2, Arterial: 38 mmHg — CL (ref 83.0–108.0)

## 2017-06-17 LAB — POCT I-STAT 3, VENOUS BLOOD GAS (G3P V)
Acid-base deficit: 2 mmol/L (ref 0.0–2.0)
Bicarbonate: 22.8 mmol/L (ref 20.0–28.0)
Bicarbonate: 24.8 mmol/L (ref 20.0–28.0)
O2 Saturation: 71 %
O2 Saturation: 73 %
TCO2: 24 mmol/L (ref 22–32)
TCO2: 26 mmol/L (ref 22–32)
pCO2, Ven: 37.9 mmHg — ABNORMAL LOW (ref 44.0–60.0)
pCO2, Ven: 40.9 mmHg — ABNORMAL LOW (ref 44.0–60.0)
pH, Ven: 7.387 (ref 7.250–7.430)
pH, Ven: 7.391 (ref 7.250–7.430)
pO2, Ven: 38 mmHg (ref 32.0–45.0)
pO2, Ven: 39 mmHg (ref 32.0–45.0)

## 2017-06-17 SURGERY — RIGHT/LEFT HEART CATH AND CORONARY ANGIOGRAPHY
Anesthesia: LOCAL

## 2017-06-17 MED ORDER — ASPIRIN 81 MG PO CHEW
CHEWABLE_TABLET | ORAL | Status: AC
  Filled 2017-06-17: qty 1

## 2017-06-17 MED ORDER — ASPIRIN 81 MG PO CHEW
81.0000 mg | CHEWABLE_TABLET | ORAL | Status: AC
  Administered 2017-06-17: 81 mg via ORAL

## 2017-06-17 MED ORDER — SODIUM CHLORIDE 0.9 % WEIGHT BASED INFUSION: 1.0000 mL/kg/h | INTRAVENOUS | Status: DC

## 2017-06-17 MED ORDER — HEPARIN SODIUM (PORCINE) 1000 UNIT/ML IJ SOLN
INTRAMUSCULAR | Status: AC
  Filled 2017-06-17: qty 1

## 2017-06-17 MED ORDER — SODIUM CHLORIDE 0.9 % IV SOLN
INTRAVENOUS | Status: DC
  Administered 2017-06-17: 10:00:00 via INTRAVENOUS

## 2017-06-17 MED ORDER — FENTANYL CITRATE (PF) 100 MCG/2ML IJ SOLN
INTRAMUSCULAR | Status: DC | PRN
  Administered 2017-06-17: 25 ug via INTRAVENOUS

## 2017-06-17 MED ORDER — VERAPAMIL HCL 2.5 MG/ML IV SOLN
INTRAVENOUS | Status: DC | PRN
  Administered 2017-06-17: 7.5 mL via INTRA_ARTERIAL

## 2017-06-17 MED ORDER — FENTANYL CITRATE (PF) 100 MCG/2ML IJ SOLN
INTRAMUSCULAR | Status: AC
  Filled 2017-06-17: qty 2

## 2017-06-17 MED ORDER — LIDOCAINE HCL (PF) 1 % IJ SOLN
INTRAMUSCULAR | Status: DC | PRN
  Administered 2017-06-17: 6 mL

## 2017-06-17 MED ORDER — HEPARIN SODIUM (PORCINE) 1000 UNIT/ML IJ SOLN
INTRAMUSCULAR | Status: DC | PRN
  Administered 2017-06-17: 3000 [IU] via INTRAVENOUS

## 2017-06-17 MED ORDER — HEPARIN (PORCINE) IN NACL 2-0.9 UNIT/ML-% IJ SOLN
INTRAMUSCULAR | Status: AC
  Filled 2017-06-17: qty 1000

## 2017-06-17 MED ORDER — SODIUM CHLORIDE 0.9% FLUSH: 3.0000 mL | INTRAVENOUS | Status: DC | PRN

## 2017-06-17 MED ORDER — SODIUM CHLORIDE 0.9 % IV SOLN: 250.0000 mL | INTRAVENOUS | Status: DC | PRN

## 2017-06-17 MED ORDER — LIDOCAINE HCL (PF) 1 % IJ SOLN
INTRAMUSCULAR | Status: AC
  Filled 2017-06-17: qty 30

## 2017-06-17 MED ORDER — SODIUM CHLORIDE 0.9% FLUSH: 3.0000 mL | Freq: Two times a day (BID) | INTRAVENOUS | Status: DC

## 2017-06-17 MED ORDER — HEPARIN (PORCINE) IN NACL 2-0.9 UNIT/ML-% IJ SOLN
INTRAMUSCULAR | Status: AC
Start: 2017-06-17 — End: ?
  Filled 2017-06-17: qty 500

## 2017-06-17 MED ORDER — IOPAMIDOL (ISOVUE-370) INJECTION 76%
INTRAVENOUS | Status: DC | PRN
  Administered 2017-06-17: 55 mL via INTRA_ARTERIAL

## 2017-06-17 MED ORDER — MIDAZOLAM HCL 2 MG/2ML IJ SOLN
INTRAMUSCULAR | Status: DC | PRN
  Administered 2017-06-17: 1 mg via INTRAVENOUS

## 2017-06-17 MED ORDER — HEPARIN (PORCINE) IN NACL 2-0.9 UNIT/ML-% IJ SOLN
INTRAMUSCULAR | Status: AC | PRN
  Administered 2017-06-17: 1500 mL

## 2017-06-17 MED ORDER — MIDAZOLAM HCL 2 MG/2ML IJ SOLN
INTRAMUSCULAR | Status: AC
  Filled 2017-06-17: qty 2

## 2017-06-17 MED ORDER — IOPAMIDOL (ISOVUE-370) INJECTION 76%
INTRAVENOUS | Status: AC
  Filled 2017-06-17: qty 100

## 2017-06-17 SURGICAL SUPPLY — 12 items
CATH BALLN WEDGE 5F 110CM (CATHETERS) ×1 IMPLANT
CATH INFINITI JR4 5F (CATHETERS) ×1 IMPLANT
CATH OPTITORQUE TIG 4.0 5F (CATHETERS) ×1 IMPLANT
DEVICE RAD COMP TR BAND LRG (VASCULAR PRODUCTS) ×1 IMPLANT
GLIDESHEATH SLEND A-KIT 6F 20G (SHEATH) ×1 IMPLANT
GUIDEWIRE INQWIRE 1.5J.035X260 (WIRE) IMPLANT
INQWIRE 1.5J .035X260CM (WIRE) ×2
KIT HEART LEFT (KITS) ×2 IMPLANT
PACK CARDIAC CATHETERIZATION (CUSTOM PROCEDURE TRAY) ×2 IMPLANT
SHEATH GLIDE SLENDER 4/5FR (SHEATH) ×1 IMPLANT
TRANSDUCER W/STOPCOCK (MISCELLANEOUS) ×2 IMPLANT
TUBING CIL FLEX 10 FLL-RA (TUBING) ×2 IMPLANT

## 2017-06-17 NOTE — Discharge Instructions (Signed)

## 2017-06-17 NOTE — Interval H&P Note (Signed)
History and Physical Interval Note:  06/17/2017 12:32 PM  Linda Lee  has presented today for surgery, with the diagnosis of shortness of breath - cp  The various methods of treatment have been discussed with the patient and family. After consideration of risks, benefits and other options for treatment, the patient has consented to  Procedure(s): RIGHT/LEFT HEART CATH AND CORONARY ANGIOGRAPHY (N/A) and possible PCI  as a surgical intervention .  The patient's history has been reviewed, patient examined, no change in status, stable for surgery.  I have reviewed the patient's chart and labs.  Questions were answered to the patient's satisfaction.    Symptom Status: Ischemic Symptoms Non-invasive Testing: Not done If no or indeterminate stress test, FFR/iFR results in all diseased vessels: Not done Diabetes Mellitus: No S/P CABG: No Antianginal therapy (number of long-acting drugs): 0 Patient undergoing renal transplant: No Patient undergoing percutaneous valve procedure: No   1 Vessel Disease No proximal LAD involvement, No proximal left dominant LCX involvement  PCI: Not rated  CABG: Not rated Proximal left dominant LCX involvement  PCI: Not rated  CABG: Not rated Proximal LAD involvement  PCI: Not rated  CABG: Not rated  2 Vessel Disease No proximal LAD involvement  PCI: Not rated  CABG: Not rated Proximal LAD involvement  PCI: Not rated  CABG: Not rated  3 Vessel Disease Low disease complexity (e.g., focal stenoses, SYNTAX <=22)  PCI: Not rated  CABG: Not rated Intermediate or high disease complexity (e.g., SYNTAX >=23)  PCI: Not rated  CABG: Not rated  Left Main Disease Isolated LMCA disease: ostial or midshaft  PCI: A (7);  Indication 24  CABG: A (8);  Indication 24 Isolated LMCA disease: bifurcation involvement  PCI: M (5);  Indication 25  CABG: A (8);  Indication 25 LMCA ostial or midshaft, concurrent low disease burden multivessel disease (e.g., 1-2  additional focal stenoses, SYNTAX <=22)  PCI: M (6);  Indication 26  CABG: A (9);  Indication 26 LMCA ostial or midshaft, concurrent intermediate or high disease burden multivessel disease (e.g., 1-2 additional bifurcation stenoses, long stenoses, SYNTAX >=23)  PCI: M (4);  Indication 27  CABG: A (9);  Indication 27 LMCA bifurcation involvement, concurrent low disease burden multivessel disease (e.g., 1-2 additional focal stenoses, SYNTAX <=22)  PCI: M (5);  Indication 28  CABG: A (8);  Indication 28 LMCA bifurcation involvement, concurrent intermediate or high disease burden multivessel disease (e.g., 1-2 additional bifurcation stenoses, long stenoses, SYNTAX >=23)  PCI: R (3);  Indication 29  CABG: A (9);  Indication 29  Notes:  A indicates appropriate. M indicates may be appropriate. R indicates rarely appropriate. Number in parentheses is median score for that indication. Reclassify indicates number of functionally diseased vessels should be decreased given negative FFR/iFR. Re-evaluate the scenario interpreting any FFR/iFR negative vessel as being not significantly stenosed.  Disease means involved vessel provides flow to a sufficient amount of myocardium to be clinically important.  If FFR testing indicates a vessel is not significant, that vessel should not be considered diseased (and the patient should be reclassified with respect to extent of functionally significant disease).  Proximal LAD + proximal left dominant LCX is considered 3 vessel CAD  2 Vessel CAD with FFR/iFR abnormal in only 1 but not both is considered 1 vessel CAD  Disease complexity includes occlusion, bifurcation, trifurcation, ostial, >20 mm, tortuosity, calcification, thrombus  LMCA disease is >=50% by angiography, MLD <2.8 mm, MLA <6 mm2; MLA 6-7.5 mm2 requires  further physiologic  See Table B for risk stratification based on noninvasive testing  Journal of the [facility] [employer] Mar 2017, 23391; DOI:  10.1016/j.jacc.2017.02.001 [email or web address].full-[email or web address] This App  2018 by the Society for Cardiovascular Angiography and Interventions  Yates DecampJay Dravin Lance

## 2017-06-18 ENCOUNTER — Encounter (HOSPITAL_COMMUNITY): Payer: Self-pay | Admitting: Cardiology

## 2017-06-23 ENCOUNTER — Ambulatory Visit (INDEPENDENT_AMBULATORY_CARE_PROVIDER_SITE_OTHER): Payer: Medicare Other

## 2017-06-23 DIAGNOSIS — T63441D Toxic effect of venom of bees, accidental (unintentional), subsequent encounter: Secondary | ICD-10-CM | POA: Diagnosis not present

## 2017-06-25 ENCOUNTER — Ambulatory Visit: Payer: Self-pay

## 2017-06-27 ENCOUNTER — Emergency Department (HOSPITAL_BASED_OUTPATIENT_CLINIC_OR_DEPARTMENT_OTHER)
Admission: EM | Admit: 2017-06-27 | Discharge: 2017-06-27 | Disposition: A | Discharge status: Home / Self Care | Payer: Medicare Other | Attending: Emergency Medicine | Admitting: Emergency Medicine

## 2017-06-27 ENCOUNTER — Emergency Department (HOSPITAL_BASED_OUTPATIENT_CLINIC_OR_DEPARTMENT_OTHER): Payer: Medicare Other

## 2017-06-27 ENCOUNTER — Other Ambulatory Visit: Payer: Self-pay

## 2017-06-27 ENCOUNTER — Encounter (HOSPITAL_BASED_OUTPATIENT_CLINIC_OR_DEPARTMENT_OTHER): Payer: Self-pay | Admitting: Emergency Medicine

## 2017-06-27 DIAGNOSIS — M25552 Pain in left hip: Secondary | ICD-10-CM | POA: Diagnosis not present

## 2017-06-27 DIAGNOSIS — Y998 Other external cause status: Secondary | ICD-10-CM | POA: Insufficient documentation

## 2017-06-27 DIAGNOSIS — Y929 Unspecified place or not applicable: Secondary | ICD-10-CM | POA: Diagnosis not present

## 2017-06-27 DIAGNOSIS — M25562 Pain in left knee: Secondary | ICD-10-CM | POA: Insufficient documentation

## 2017-06-27 DIAGNOSIS — I5032 Chronic diastolic (congestive) heart failure: Secondary | ICD-10-CM | POA: Diagnosis not present

## 2017-06-27 DIAGNOSIS — Z96651 Presence of right artificial knee joint: Secondary | ICD-10-CM | POA: Insufficient documentation

## 2017-06-27 DIAGNOSIS — S0083XA Contusion of other part of head, initial encounter: Secondary | ICD-10-CM | POA: Diagnosis not present

## 2017-06-27 DIAGNOSIS — Y9301 Activity, walking, marching and hiking: Secondary | ICD-10-CM | POA: Insufficient documentation

## 2017-06-27 DIAGNOSIS — R110 Nausea: Secondary | ICD-10-CM | POA: Diagnosis not present

## 2017-06-27 DIAGNOSIS — S0990XA Unspecified injury of head, initial encounter: Secondary | ICD-10-CM | POA: Diagnosis present

## 2017-06-27 DIAGNOSIS — S72452A Displaced supracondylar fracture without intracondylar extension of lower end of left femur, initial encounter for closed fracture: Secondary | ICD-10-CM | POA: Diagnosis not present

## 2017-06-27 DIAGNOSIS — W108XXA Fall (on) (from) other stairs and steps, initial encounter: Secondary | ICD-10-CM | POA: Diagnosis not present

## 2017-06-27 DIAGNOSIS — Z79899 Other long term (current) drug therapy: Secondary | ICD-10-CM | POA: Diagnosis not present

## 2017-06-27 DIAGNOSIS — Z87891 Personal history of nicotine dependence: Secondary | ICD-10-CM | POA: Diagnosis not present

## 2017-06-27 DIAGNOSIS — Z7982 Long term (current) use of aspirin: Secondary | ICD-10-CM | POA: Insufficient documentation

## 2017-06-27 DIAGNOSIS — W19XXXA Unspecified fall, initial encounter: Secondary | ICD-10-CM

## 2017-06-27 NOTE — ED Notes (Signed)
Patient transported to X-ray 

## 2017-06-27 NOTE — ED Notes (Signed)
ED Provider at bedside. 

## 2017-06-27 NOTE — ED Provider Notes (Signed)
[facility] EMERGENCY DEPARTMENT Provider Note   CSN: [id number] Arrival date & time: 06/27/17  4098     History   Chief Complaint Chief Complaint  Patient presents with  . Fall    HPI Linda Lee is a 81 y.o. female.  Patient is a 81 year old female with a history of angina, hyperlipidemia as well as osteoarthritis who presents after a fall.  She states last night she was walking down 6 stairs that were brick and fell landing on the stairs.  She is not sure what led her to the fall although she had no dizziness, chest pain, syncope, shortness of breath, weakness or other symptoms preceding the fall.  She has had some chronic tingling/numbness in her left foot and she questions whether this led to her fall.  She states it happened so fast she is not sure if she tripped or something else because the fall.  She had no loss of consciousness.  She hit the left side of her head and face.  She has had some nausea but no vomiting.  No vision changes.  She denies any neck or back pain.  She does have some pain in her left knee in the outside part of her left hip.  She denies any other injuries from the fall.  Other than the tingling in her left foot which is chronic she denies any numbness or weakness to her extremities.  She states her tetanus shot is up-to-date.      Past Medical History:  Diagnosis Date  . Anginal pain (HCC)   . Angio-edema   . Arthritis   . GERD (gastroesophageal reflux disease)   . Hyperlipemia   . Migraines     Patient Active Problem List   Diagnosis Date Noted  . Dyspnea on exertion 06/17/2017  . Toxic effect of venom of bees, unintentional 06/03/2017  . OA (osteoarthritis) of knee 09/25/2015  . UTI (lower urinary tract infection) 09/04/2012  . Diastolic CHF, chronic (HCC) 09/04/2012  . Subjective visual disturbance, right eye 09/03/2012  . Numbness around mouth 09/03/2012  . Vision disturbance 09/03/2012  . THORACIC AORTIC ANEURYSM  07/06/2009  . Atypical chest pain 07/06/2009  . HYPERLIPIDEMIA 06/30/2009  . GERD 06/30/2009  . LEG CRAMPS 06/30/2009    Past Surgical History:  Procedure Laterality Date  . ADENOIDECTOMY    . BACK SURGERY    . HEMORRHOID SURGERY    . RIGHT/LEFT HEART CATH AND CORONARY ANGIOGRAPHY N/A 06/17/2017   Procedure: RIGHT/LEFT HEART CATH AND CORONARY ANGIOGRAPHY;  Surgeon: Yates Decamp, MD;  Location: [facility] INVASIVE CV LAB;  Service: Cardiovascular;  Laterality: N/A;  . 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    . TONSILLECTOMY    . TOTAL KNEE ARTHROPLASTY Right 09/25/2015   Procedure: TOTAL KNEE ARTHROPLASTY;  Surgeon: Ollen Gross, MD;  Location: [facility] ORS;  Service: Orthopedics;  Laterality: Right;    OB History    No data available       Home Medications    Prior to Admission medications   Medication Sig Start Date End Date Taking? Authorizing Provider  acetaminophen (TYLENOL) 500 MG tablet Take 1,000 mg every 6 (six) hours as needed by mouth for moderate pain or headache.    [provider]  aspirin EC 81 MG tablet Take 81 mg by mouth daily.    [provider]  atorvastatin (LIPITOR) 20 MG tablet Take 20 mg every Monday by mouth.     [provider]  CALCIUM PO Take 1,000  mg by mouth daily.    [provider]  CRANBERRY PO Take 300 mg daily by mouth.    [provider]  Cyanocobalamin (VITAMIN B-12 PO) Take 1,200 mcg daily by mouth.     [provider]  docusate sodium (COLACE) 100 MG capsule Take 100 mg daily by mouth.    [provider]  EPINEPHrine 0.3 mg/0.3 mL IJ SOAJ injection Inject 0.3 mLs (0.3 mg total) into the muscle once as needed (anaphylaxis). 04/01/17   Charlynne Pander, MD  Ergocalciferol (VITAMIN D2) 2000 units TABS Take 2,000 Units daily by mouth.     [provider]  estradiol (ESTRACE) 0.1 MG/GM vaginal cream Place 1 Applicatorful vaginally once a week.    [provider]  furosemide (LASIX) 20  MG tablet Take 20 mg by mouth every morning.     [provider]  ibuprofen (ADVIL,MOTRIN) 200 MG tablet Take 200 mg every 6 (six) hours as needed by mouth for headache or moderate pain.    [provider]  omeprazole (PRILOSEC OTC) 20 MG tablet Take 20 mg daily as needed by mouth (for acid reflux or heartburn).     [provider]  potassium chloride (K-DUR,KLOR-CON) 10 MEQ tablet Take 10 mEq by mouth every morning.     [provider]  saccharomyces boulardii (FLORASTOR) 250 MG capsule Take 250 mg 2 (two) times daily by mouth.    [provider]    Family History Family History  Problem Relation Age of Onset  . Allergic rhinitis Neg Hx   . Angioedema Neg Hx   . Asthma Neg Hx   . Eczema Neg Hx   . Immunodeficiency Neg Hx   . Urticaria Neg Hx     Social History Social History   Tobacco Use  . Smoking status: Former Smoker    Types: Cigarettes    Last attempt to quit: 08/15/1963    Years since quitting: 53.9  . Smokeless tobacco: Never Used  Substance Use Topics  . Alcohol use: Yes    Alcohol/week: 0.6 oz    Types: 1 Glasses of wine per week    Comment: almost every night  . Drug use: No     Allergies   Wasp venom and Fluconazole   Review of Systems Review of Systems  Constitutional: Negative for activity change, appetite change and fever.  HENT: Positive for facial swelling. Negative for dental problem, nosebleeds and trouble swallowing.   Eyes: Negative for pain and visual disturbance.  Respiratory: Negative for shortness of breath.   Cardiovascular: Negative for chest pain.  Gastrointestinal: Negative for abdominal pain, nausea and vomiting.  Genitourinary: Negative for dysuria and hematuria.  Musculoskeletal: Positive for arthralgias. Negative for back pain, joint swelling and neck pain.  Skin: Positive for wound.  Neurological: Negative for weakness, numbness and headaches.  Psychiatric/Behavioral: Negative for  confusion.     Physical Exam Updated Vital Signs BP 131/71 (BP Location: Right Arm)   Pulse 66   Temp 98.1 F (36.7 C)   Resp 16   Ht 5\' 5"  (1.651 m)   Wt 68.9 kg (152 lb)   SpO2 99%   BMI 25.29 kg/m   Physical Exam  Constitutional: She is oriented to person, place, and time. She appears well-developed and well-nourished.  HENT:  Head: Normocephalic.  Nose: Nose normal.  No hemotympanum.  Patient has abrasions to her left forehead and left maxilla.  There is tenderness over the maxilla.  There is no  malocclusion of the jaw.  Extraocular eye movements are intact.  There is no evident eye injury.  No conjunctival injection.  Eyes: Conjunctivae are normal. Pupils are equal, round, and reactive to light.  Neck:  Patient has tenderness to the upper cervical spine.  No pain to the, thoracic, or LS spine.  No step-offs or deformities noted  Cardiovascular: Normal rate and regular rhythm.  No murmur heard. No evidence of external trauma to the chest or abdomen  Pulmonary/Chest: Effort normal and breath sounds normal. No respiratory distress. She has no wheezes. She exhibits no tenderness.  Abdominal: Soft. Bowel sounds are normal. She exhibits no distension. There is no tenderness.  Musculoskeletal: Normal range of motion.  Patient has some pain on range of motion of the left knee.  There is no significant effusion.  No obvious wounds noted.  There is no gross ligament instability.  No pain on palpation or ROM of the other extremities, including the hips.  There is some mild tenderness along the musculature of the left thigh but no underlying bony tenderness.  Neurological: She is alert and oriented to person, place, and time. She has normal strength. No sensory deficit.  Skin: Skin is warm and dry. Capillary refill takes less than 2 seconds.  Psychiatric: She has a normal mood and affect.  Vitals reviewed.    ED Treatments / Results  Labs (all labs ordered are listed, but only  abnormal results are displayed) Labs Reviewed - No data to display  EKG  EKG Interpretation None       Radiology Ct Head Wo Contrast  Result Date: 06/27/2017 CLINICAL DATA:  81 year old female status post trip and fall outside last night. Pain, contusion, abrasions on left side of head. EXAM: CT HEAD WITHOUT CONTRAST CT MAXILLOFACIAL WITHOUT CONTRAST CT CERVICAL SPINE WITHOUT CONTRAST TECHNIQUE: Multidetector CT imaging of the head, cervical spine, and maxillofacial structures were performed using the standard protocol without intravenous contrast. Multiplanar CT image reconstructions of the cervical spine and maxillofacial structures were also generated. COMPARISON:  Brain MRI 06/12/2014. Head CT without contrast 09/03/2012. FINDINGS: CT HEAD FINDINGS Brain: Streak artifact related to bilateral hearing aids. Cerebral volume remains normal for age. No midline shift, ventriculomegaly, mass effect, evidence of mass lesion, intracranial hemorrhage or evidence of cortically based acute infarction. Gray-white matter differentiation is within normal limits throughout the brain. Vascular: Calcified atherosclerosis at the skull base. No suspicious intracranial vascular hyperdensity. Skull: Stable.  No skull fracture identified. Other: Broad-based left forehead scalp hematoma measures up to 6 mm in thickness. No subcutaneous gas. Underlying left frontal bone intact. Other scalp soft tissues appear negative. CT MAXILLOFACIAL FINDINGS Osseous: Mandible intact. No maxilla or zygoma fracture. Intact nasal bones. Central skullbase appears intact. Orbits: Intact orbital walls. Visualized orbit soft tissues are within normal limits. Left supraorbital scalp hematoma. Sinuses: Visualized paranasal sinuses and mastoids are stable since 2015 and well pneumatized. Tympanic cavities are clear. Soft tissues: Negative visualized noncontrast larynx, pharynx, parapharyngeal spaces, retropharyngeal space, sublingual space,  submandibular spaces and parotid spaces. No superficial face soft tissue injury identified. CT CERVICAL SPINE FINDINGS Alignment: Straightening of cervical lordosis with mild retrolisthesis of C5 on C6. Advanced disc and endplate degeneration at that level. Bilateral posterior element alignment is within normal limits. Cervicothoracic junction alignment is within normal limits. Skull base and vertebrae: Visualized skull base is intact. No atlanto-occipital dissociation. No cervical spine fracture identified. Soft tissues and spinal canal: No prevertebral fluid or swelling. No visible canal hematoma. Negative noncontrast  neck soft tissues. Disc levels: Advanced chronic disc and endplate degeneration C4-C5 through C6-C7. Chronic upper cervical disc degeneration with broad-based disc protrusions. Moderate and severe multilevel cervical facet degeneration greater on the right. Multilevel degenerative cervical spinal stenosis appears maximal at C5-C6 and is probably moderate with left hemi cord signal abnormality. Upper chest: Visible upper thoracic levels appear intact. IMPRESSION: 1. Left forehead scalp hematoma without underlying fracture. 2. No other acute traumatic injury identified. 3. Advanced cervical spine degeneration with multilevel spinal stenosis. Up to moderate spinal stenosis at C5-C6 with suspected mass-effect on the left hemi cord. 4. Stable and normal for age non contrast CT appearance of the brain. Electronically Signed   By: Odessa Fleming M.D.   On: 06/27/2017 08:11   Ct Cervical Spine Wo Contrast  Result Date: 06/27/2017 CLINICAL DATA:  81 year old female status post trip and fall outside last night. Pain, contusion, abrasions on left side of head. EXAM: CT HEAD WITHOUT CONTRAST CT MAXILLOFACIAL WITHOUT CONTRAST CT CERVICAL SPINE WITHOUT CONTRAST TECHNIQUE: Multidetector CT imaging of the head, cervical spine, and maxillofacial structures were performed using the standard protocol without intravenous  contrast. Multiplanar CT image reconstructions of the cervical spine and maxillofacial structures were also generated. COMPARISON:  Brain MRI 06/12/2014. Head CT without contrast 09/03/2012. FINDINGS: CT HEAD FINDINGS Brain: Streak artifact related to bilateral hearing aids. Cerebral volume remains normal for age. No midline shift, ventriculomegaly, mass effect, evidence of mass lesion, intracranial hemorrhage or evidence of cortically based acute infarction. Gray-white matter differentiation is within normal limits throughout the brain. Vascular: Calcified atherosclerosis at the skull base. No suspicious intracranial vascular hyperdensity. Skull: Stable.  No skull fracture identified. Other: Broad-based left forehead scalp hematoma measures up to 6 mm in thickness. No subcutaneous gas. Underlying left frontal bone intact. Other scalp soft tissues appear negative. CT MAXILLOFACIAL FINDINGS Osseous: Mandible intact. No maxilla or zygoma fracture. Intact nasal bones. Central skullbase appears intact. Orbits: Intact orbital walls. Visualized orbit soft tissues are within normal limits. Left supraorbital scalp hematoma. Sinuses: Visualized paranasal sinuses and mastoids are stable since 2015 and well pneumatized. Tympanic cavities are clear. Soft tissues: Negative visualized noncontrast larynx, pharynx, parapharyngeal spaces, retropharyngeal space, sublingual space, submandibular spaces and parotid spaces. No superficial face soft tissue injury identified. CT CERVICAL SPINE FINDINGS Alignment: Straightening of cervical lordosis with mild retrolisthesis of C5 on C6. Advanced disc and endplate degeneration at that level. Bilateral posterior element alignment is within normal limits. Cervicothoracic junction alignment is within normal limits. Skull base and vertebrae: Visualized skull base is intact. No atlanto-occipital dissociation. No cervical spine fracture identified. Soft tissues and spinal canal: No prevertebral  fluid or swelling. No visible canal hematoma. Negative noncontrast neck soft tissues. Disc levels: Advanced chronic disc and endplate degeneration C4-C5 through C6-C7. Chronic upper cervical disc degeneration with broad-based disc protrusions. Moderate and severe multilevel cervical facet degeneration greater on the right. Multilevel degenerative cervical spinal stenosis appears maximal at C5-C6 and is probably moderate with left hemi cord signal abnormality. Upper chest: Visible upper thoracic levels appear intact. IMPRESSION: 1. Left forehead scalp hematoma without underlying fracture. 2. No other acute traumatic injury identified. 3. Advanced cervical spine degeneration with multilevel spinal stenosis. Up to moderate spinal stenosis at C5-C6 with suspected mass-effect on the left hemi cord. 4. Stable and normal for age non contrast CT appearance of the brain. Electronically Signed   By: Odessa Fleming M.D.   On: 06/27/2017 08:11   Dg Knee Complete 4 Views Left  Result  Date: 06/27/2017 CLINICAL DATA:  Fall last night with left knee pain. EXAM: LEFT KNEE - COMPLETE 4+ VIEW COMPARISON:  None. FINDINGS: There mild tricompartmental osteoarthritic changes. No evidence of effusion. There is a 1 cm linear bony fragment in the region of the intracondylar notch not seen on the lateral film. This may represent a chip fracture versus small loose body. IMPRESSION: 1 cm fragment adjacent the intercondylar notch which may represent a loose body or possible chip fracture. Mild tricompartmental osteoarthritis. Electronically Signed   By: Elberta Fortisaniel  Boyle M.D.   On: 06/27/2017 08:07   Ct Maxillofacial Wo Cm  Result Date: 06/27/2017 CLINICAL DATA:  81 year old female status post trip and fall outside last night. Pain, contusion, abrasions on left side of head. EXAM: CT HEAD WITHOUT CONTRAST CT MAXILLOFACIAL WITHOUT CONTRAST CT CERVICAL SPINE WITHOUT CONTRAST TECHNIQUE: Multidetector CT imaging of the head, cervical spine, and  maxillofacial structures were performed using the standard protocol without intravenous contrast. Multiplanar CT image reconstructions of the cervical spine and maxillofacial structures were also generated. COMPARISON:  Brain MRI 06/12/2014. Head CT without contrast 09/03/2012. FINDINGS: CT HEAD FINDINGS Brain: Streak artifact related to bilateral hearing aids. Cerebral volume remains normal for age. No midline shift, ventriculomegaly, mass effect, evidence of mass lesion, intracranial hemorrhage or evidence of cortically based acute infarction. Gray-white matter differentiation is within normal limits throughout the brain. Vascular: Calcified atherosclerosis at the skull base. No suspicious intracranial vascular hyperdensity. Skull: Stable.  No skull fracture identified. Other: Broad-based left forehead scalp hematoma measures up to 6 mm in thickness. No subcutaneous gas. Underlying left frontal bone intact. Other scalp soft tissues appear negative. CT MAXILLOFACIAL FINDINGS Osseous: Mandible intact. No maxilla or zygoma fracture. Intact nasal bones. Central skullbase appears intact. Orbits: Intact orbital walls. Visualized orbit soft tissues are within normal limits. Left supraorbital scalp hematoma. Sinuses: Visualized paranasal sinuses and mastoids are stable since 2015 and well pneumatized. Tympanic cavities are clear. Soft tissues: Negative visualized noncontrast larynx, pharynx, parapharyngeal spaces, retropharyngeal space, sublingual space, submandibular spaces and parotid spaces. No superficial face soft tissue injury identified. CT CERVICAL SPINE FINDINGS Alignment: Straightening of cervical lordosis with mild retrolisthesis of C5 on C6. Advanced disc and endplate degeneration at that level. Bilateral posterior element alignment is within normal limits. Cervicothoracic junction alignment is within normal limits. Skull base and vertebrae: Visualized skull base is intact. No atlanto-occipital dissociation.  No cervical spine fracture identified. Soft tissues and spinal canal: No prevertebral fluid or swelling. No visible canal hematoma. Negative noncontrast neck soft tissues. Disc levels: Advanced chronic disc and endplate degeneration C4-C5 through C6-C7. Chronic upper cervical disc degeneration with broad-based disc protrusions. Moderate and severe multilevel cervical facet degeneration greater on the right. Multilevel degenerative cervical spinal stenosis appears maximal at C5-C6 and is probably moderate with left hemi cord signal abnormality. Upper chest: Visible upper thoracic levels appear intact. IMPRESSION: 1. Left forehead scalp hematoma without underlying fracture. 2. No other acute traumatic injury identified. 3. Advanced cervical spine degeneration with multilevel spinal stenosis. Up to moderate spinal stenosis at C5-C6 with suspected mass-effect on the left hemi cord. 4. Stable and normal for age non contrast CT appearance of the brain. Electronically Signed   By: Odessa FlemingH  Hall M.D.   On: 06/27/2017 08:11    Procedures Procedures (including critical care time)  Medications Ordered in ED Medications - No data to display   Initial Impression / Assessment and Plan / ED Course  I have reviewed the triage vital signs and the  nursing notes.  Pertinent labs & imaging results that were available during my care of the patient were reviewed by me and considered in my medical decision making (see chart for details).     Patient status post a mechanical fall.  There is no evidence of intracranial hemorrhage.  No facial fractures.  No acute cervical spine injuries.  I did advise her of her severe degenerative disease in her spine.  She will follow-up with her PCP regarding this.  There is also a small chip in the intercondylar area of her left knee.  Given the circumstances, I will assume that this is a potential fracture.  She was placed in a knee immobilizer.  She will follow-up with her orthopedist at  [facility] regarding this.  Return precautions were given.  Final Clinical Impressions(s) / ED Diagnoses   Final diagnoses:  Fall, initial encounter  Injury of head, initial encounter  Closed displaced supracondylar fracture of distal end of left femur without intracondylar extension, initial encounter [facility])  Contusion of face, initial encounter    ED Discharge Orders    None       Rolan Bucco, MD 06/27/17 [phone]

## 2017-06-27 NOTE — ED Triage Notes (Signed)
Pt tripped and fell outside last night. C/o head pain and L knee pain. Denies LOC. Contusion and abrasions noted to L side of head.

## 2017-06-30 ENCOUNTER — Ambulatory Visit (INDEPENDENT_AMBULATORY_CARE_PROVIDER_SITE_OTHER): Payer: Medicare Other

## 2017-06-30 DIAGNOSIS — T63441D Toxic effect of venom of bees, accidental (unintentional), subsequent encounter: Secondary | ICD-10-CM | POA: Diagnosis not present

## 2017-07-07 ENCOUNTER — Ambulatory Visit (INDEPENDENT_AMBULATORY_CARE_PROVIDER_SITE_OTHER): Payer: Medicare Other

## 2017-07-07 DIAGNOSIS — T63441D Toxic effect of venom of bees, accidental (unintentional), subsequent encounter: Secondary | ICD-10-CM

## 2017-07-16 ENCOUNTER — Ambulatory Visit: Payer: Self-pay

## 2017-07-17 ENCOUNTER — Ambulatory Visit (INDEPENDENT_AMBULATORY_CARE_PROVIDER_SITE_OTHER): Payer: Medicare Other

## 2017-07-17 DIAGNOSIS — T63441D Toxic effect of venom of bees, accidental (unintentional), subsequent encounter: Secondary | ICD-10-CM | POA: Diagnosis not present

## 2017-07-24 ENCOUNTER — Ambulatory Visit (INDEPENDENT_AMBULATORY_CARE_PROVIDER_SITE_OTHER): Payer: Medicare Other

## 2017-07-24 DIAGNOSIS — T63441D Toxic effect of venom of bees, accidental (unintentional), subsequent encounter: Secondary | ICD-10-CM | POA: Diagnosis not present

## 2017-07-31 ENCOUNTER — Ambulatory Visit (INDEPENDENT_AMBULATORY_CARE_PROVIDER_SITE_OTHER): Payer: Medicare Other

## 2017-07-31 DIAGNOSIS — T63441D Toxic effect of venom of bees, accidental (unintentional), subsequent encounter: Secondary | ICD-10-CM | POA: Diagnosis not present

## 2017-08-01 ENCOUNTER — Other Ambulatory Visit: Payer: Self-pay | Admitting: Cardiothoracic Surgery

## 2017-08-01 DIAGNOSIS — I712 Thoracic aortic aneurysm, without rupture, unspecified: Secondary | ICD-10-CM

## 2017-08-05 ENCOUNTER — Emergency Department (HOSPITAL_COMMUNITY)
Admission: EM | Admit: 2017-08-05 | Discharge: 2017-08-05 | Disposition: A | Discharge status: Home / Self Care | Payer: Medicare Other | Attending: Emergency Medicine | Admitting: Emergency Medicine

## 2017-08-05 ENCOUNTER — Encounter (HOSPITAL_COMMUNITY): Payer: Self-pay | Admitting: Emergency Medicine

## 2017-08-05 ENCOUNTER — Emergency Department (HOSPITAL_COMMUNITY): Payer: Medicare Other

## 2017-08-05 DIAGNOSIS — Y92410 Unspecified street and highway as the place of occurrence of the external cause: Secondary | ICD-10-CM | POA: Insufficient documentation

## 2017-08-05 DIAGNOSIS — Z7982 Long term (current) use of aspirin: Secondary | ICD-10-CM | POA: Diagnosis not present

## 2017-08-05 DIAGNOSIS — Z96651 Presence of right artificial knee joint: Secondary | ICD-10-CM | POA: Insufficient documentation

## 2017-08-05 DIAGNOSIS — Y9389 Activity, other specified: Secondary | ICD-10-CM | POA: Insufficient documentation

## 2017-08-05 DIAGNOSIS — S20219A Contusion of unspecified front wall of thorax, initial encounter: Secondary | ICD-10-CM | POA: Diagnosis not present

## 2017-08-05 DIAGNOSIS — I509 Heart failure, unspecified: Secondary | ICD-10-CM | POA: Insufficient documentation

## 2017-08-05 DIAGNOSIS — Z87891 Personal history of nicotine dependence: Secondary | ICD-10-CM | POA: Diagnosis not present

## 2017-08-05 DIAGNOSIS — Z79899 Other long term (current) drug therapy: Secondary | ICD-10-CM | POA: Diagnosis not present

## 2017-08-05 DIAGNOSIS — Y999 Unspecified external cause status: Secondary | ICD-10-CM | POA: Diagnosis not present

## 2017-08-05 DIAGNOSIS — S299XXA Unspecified injury of thorax, initial encounter: Secondary | ICD-10-CM | POA: Diagnosis present

## 2017-08-05 MED ORDER — ONDANSETRON 4 MG PO TBDP
4.0000 mg | ORAL_TABLET | Freq: Once | ORAL | Status: AC
  Administered 2017-08-05: 4 mg via ORAL
  Filled 2017-08-05: qty 1
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
## 2017-08-05 NOTE — Discharge Instructions (Signed)
Return if any problems.   Tylenol for pain  °
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
## 2017-08-05 NOTE — ED Notes (Signed)
Patient requesting cab ride home. Called cab and assisted patient to lobby to wait. 

## 2017-08-07 ENCOUNTER — Ambulatory Visit (INDEPENDENT_AMBULATORY_CARE_PROVIDER_SITE_OTHER): Payer: Medicare Other

## 2017-08-07 ENCOUNTER — Ambulatory Visit: Payer: Self-pay

## 2017-08-07 DIAGNOSIS — T63441D Toxic effect of venom of bees, accidental (unintentional), subsequent encounter: Secondary | ICD-10-CM | POA: Diagnosis not present

## 2017-08-14 ENCOUNTER — Ambulatory Visit (INDEPENDENT_AMBULATORY_CARE_PROVIDER_SITE_OTHER): Payer: Medicare Other | Admitting: *Deleted

## 2017-08-14 DIAGNOSIS — T63441D Toxic effect of venom of bees, accidental (unintentional), subsequent encounter: Secondary | ICD-10-CM

## 2017-08-15 ENCOUNTER — Other Ambulatory Visit: Payer: Self-pay | Admitting: Internal Medicine

## 2017-08-15 DIAGNOSIS — R072 Precordial pain: Secondary | ICD-10-CM

## 2017-08-15 DIAGNOSIS — R071 Chest pain on breathing: Secondary | ICD-10-CM

## 2017-08-18 ENCOUNTER — Ambulatory Visit
Admission: RE | Admit: 2017-08-18 | Discharge: 2017-08-18 | Disposition: A | Discharge status: Home / Self Care | Payer: Medicare Other | Source: Ambulatory Visit | Attending: Internal Medicine | Admitting: Internal Medicine

## 2017-08-18 ENCOUNTER — Other Ambulatory Visit: Payer: Medicare Other

## 2017-08-18 DIAGNOSIS — R071 Chest pain on breathing: Secondary | ICD-10-CM

## 2017-08-18 DIAGNOSIS — R072 Precordial pain: Secondary | ICD-10-CM

## 2017-08-19 ENCOUNTER — Telehealth: Payer: Self-pay

## 2017-08-19 NOTE — Telephone Encounter (Signed)
Called patient to notify her that she did not need another CT before her appointment per Dr. Maren BeachVanTrigt.  Left message on home phone to call if she had any other questions.

## 2017-08-21 ENCOUNTER — Ambulatory Visit (INDEPENDENT_AMBULATORY_CARE_PROVIDER_SITE_OTHER): Payer: Medicare Other | Admitting: *Deleted

## 2017-08-21 DIAGNOSIS — T63441D Toxic effect of venom of bees, accidental (unintentional), subsequent encounter: Secondary | ICD-10-CM | POA: Diagnosis not present

## 2017-08-27 ENCOUNTER — Ambulatory Visit (INDEPENDENT_AMBULATORY_CARE_PROVIDER_SITE_OTHER): Payer: Medicare Other | Admitting: *Deleted

## 2017-08-27 DIAGNOSIS — T63441D Toxic effect of venom of bees, accidental (unintentional), subsequent encounter: Secondary | ICD-10-CM

## 2017-09-03 ENCOUNTER — Other Ambulatory Visit: Payer: Medicare Other

## 2017-09-03 ENCOUNTER — Ambulatory Visit (INDEPENDENT_AMBULATORY_CARE_PROVIDER_SITE_OTHER): Payer: Medicare Other | Admitting: *Deleted

## 2017-09-03 ENCOUNTER — Ambulatory Visit: Payer: Medicare Other | Admitting: Cardiothoracic Surgery

## 2017-09-03 DIAGNOSIS — T63441D Toxic effect of venom of bees, accidental (unintentional), subsequent encounter: Secondary | ICD-10-CM | POA: Diagnosis not present

## 2017-09-05 ENCOUNTER — Ambulatory Visit: Payer: Medicare Other | Admitting: Cardiothoracic Surgery

## 2017-09-09 ENCOUNTER — Ambulatory Visit: Payer: Medicare Other | Admitting: Cardiothoracic Surgery

## 2017-09-10 ENCOUNTER — Ambulatory Visit (INDEPENDENT_AMBULATORY_CARE_PROVIDER_SITE_OTHER): Payer: Medicare Other

## 2017-09-10 DIAGNOSIS — T63441D Toxic effect of venom of bees, accidental (unintentional), subsequent encounter: Secondary | ICD-10-CM | POA: Diagnosis not present

## 2017-09-18 ENCOUNTER — Ambulatory Visit (INDEPENDENT_AMBULATORY_CARE_PROVIDER_SITE_OTHER): Payer: Medicare Other

## 2017-09-18 DIAGNOSIS — T63441D Toxic effect of venom of bees, accidental (unintentional), subsequent encounter: Secondary | ICD-10-CM

## 2017-09-24 ENCOUNTER — Ambulatory Visit (INDEPENDENT_AMBULATORY_CARE_PROVIDER_SITE_OTHER): Payer: Medicare Other | Admitting: *Deleted

## 2017-09-24 ENCOUNTER — Ambulatory Visit: Payer: Medicare Other | Admitting: Cardiothoracic Surgery

## 2017-09-24 DIAGNOSIS — T63441D Toxic effect of venom of bees, accidental (unintentional), subsequent encounter: Secondary | ICD-10-CM | POA: Diagnosis not present

## 2017-09-25 ENCOUNTER — Ambulatory Visit: Payer: Self-pay

## 2017-10-01 ENCOUNTER — Ambulatory Visit: Payer: Self-pay

## 2017-10-02 ENCOUNTER — Ambulatory Visit (INDEPENDENT_AMBULATORY_CARE_PROVIDER_SITE_OTHER): Payer: Medicare Other

## 2017-10-02 DIAGNOSIS — T63441D Toxic effect of venom of bees, accidental (unintentional), subsequent encounter: Secondary | ICD-10-CM

## 2017-10-09 ENCOUNTER — Ambulatory Visit: Payer: Self-pay

## 2017-10-09 ENCOUNTER — Ambulatory Visit (INDEPENDENT_AMBULATORY_CARE_PROVIDER_SITE_OTHER): Payer: Medicare Other

## 2017-10-09 DIAGNOSIS — T63441D Toxic effect of venom of bees, accidental (unintentional), subsequent encounter: Secondary | ICD-10-CM | POA: Diagnosis not present

## 2017-10-15 ENCOUNTER — Ambulatory Visit: Payer: Medicare Other | Admitting: Cardiothoracic Surgery

## 2017-10-15 ENCOUNTER — Encounter: Payer: Self-pay | Admitting: Cardiothoracic Surgery

## 2017-10-15 VITALS — BP 137/75 | HR 66 | Resp 20 | Ht 65.0 in | Wt 153.0 lb

## 2017-10-15 DIAGNOSIS — I712 Thoracic aortic aneurysm, without rupture, unspecified: Secondary | ICD-10-CM
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
## 2017-10-16 ENCOUNTER — Ambulatory Visit (INDEPENDENT_AMBULATORY_CARE_PROVIDER_SITE_OTHER): Payer: Medicare Other | Admitting: *Deleted

## 2017-10-16 DIAGNOSIS — T63441D Toxic effect of venom of bees, accidental (unintentional), subsequent encounter: Secondary | ICD-10-CM

## 2017-10-16 DIAGNOSIS — J309 Allergic rhinitis, unspecified: Secondary | ICD-10-CM

## 2017-10-23 ENCOUNTER — Ambulatory Visit (INDEPENDENT_AMBULATORY_CARE_PROVIDER_SITE_OTHER): Payer: Medicare Other

## 2017-10-23 DIAGNOSIS — T63441D Toxic effect of venom of bees, accidental (unintentional), subsequent encounter: Secondary | ICD-10-CM | POA: Diagnosis not present

## 2017-10-30 ENCOUNTER — Ambulatory Visit (INDEPENDENT_AMBULATORY_CARE_PROVIDER_SITE_OTHER): Payer: Medicare Other

## 2017-10-30 DIAGNOSIS — T63441D Toxic effect of venom of bees, accidental (unintentional), subsequent encounter: Secondary | ICD-10-CM

## 2017-11-06 ENCOUNTER — Ambulatory Visit (INDEPENDENT_AMBULATORY_CARE_PROVIDER_SITE_OTHER): Payer: Medicare Other

## 2017-11-06 DIAGNOSIS — T63441D Toxic effect of venom of bees, accidental (unintentional), subsequent encounter: Secondary | ICD-10-CM

## 2017-11-13 ENCOUNTER — Ambulatory Visit (INDEPENDENT_AMBULATORY_CARE_PROVIDER_SITE_OTHER): Payer: Medicare Other

## 2017-11-13 DIAGNOSIS — T63441D Toxic effect of venom of bees, accidental (unintentional), subsequent encounter: Secondary | ICD-10-CM

## 2017-11-19 ENCOUNTER — Ambulatory Visit (INDEPENDENT_AMBULATORY_CARE_PROVIDER_SITE_OTHER): Payer: Medicare Other

## 2017-11-19 DIAGNOSIS — T63441D Toxic effect of venom of bees, accidental (unintentional), subsequent encounter: Secondary | ICD-10-CM | POA: Diagnosis not present

## 2017-11-26 ENCOUNTER — Ambulatory Visit (INDEPENDENT_AMBULATORY_CARE_PROVIDER_SITE_OTHER): Payer: Medicare Other | Admitting: *Deleted

## 2017-11-26 DIAGNOSIS — T63441D Toxic effect of venom of bees, accidental (unintentional), subsequent encounter: Secondary | ICD-10-CM

## 2017-12-03 ENCOUNTER — Encounter: Payer: Self-pay | Admitting: Neurology

## 2017-12-03 ENCOUNTER — Ambulatory Visit (INDEPENDENT_AMBULATORY_CARE_PROVIDER_SITE_OTHER): Payer: Medicare Other | Admitting: *Deleted

## 2017-12-03 DIAGNOSIS — T63441D Toxic effect of venom of bees, accidental (unintentional), subsequent encounter: Secondary | ICD-10-CM | POA: Diagnosis not present

## 2017-12-08 DIAGNOSIS — G5602 Carpal tunnel syndrome, left upper limb: Secondary | ICD-10-CM | POA: Insufficient documentation

## 2017-12-08 DIAGNOSIS — G5620 Lesion of ulnar nerve, unspecified upper limb: Secondary | ICD-10-CM | POA: Insufficient documentation

## 2017-12-09 NOTE — Progress Notes (Addendum)
Linda Lee was seen today in the movement disorders clinic for neurologic consultation at the request of Crist Infante, MD.  The consultation is for the evaluation of rest tremor.  Specific Symptoms:  Tremor: Yes.  , started several years ago in R foot but about a month ago noted tremor in the R thumb and sometimes few fingers on the right hand.  Notes with rest and activation but doesn't think she has foot tremor when ambulation. Family hx of similar:  No., father may have had tremor but she is unsure Voice: no change Sleep: sleeps well  Vivid Dreams:  No.  Acting out dreams:  No. Wet Pillows: No. Postural symptoms:  Yes.   - "but its really not terrible"  Falls?  Yes.   - last fall was thanksgiving - missed a step and fell and got a black eye Bradykinesia symptoms: no bradykinesia noted Loss of smell:  No. Loss of taste:  No. Urinary Incontinence:  Yes.  , some urgency - has appt later today with gyn for this Difficulty Swallowing:  No. Handwriting, micrographia: No. Trouble with ADL's:  No.  Trouble buttoning clothing: No. Depression:  No. 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 Hallucinations:  No.  visual distortions: No. N/V:  No. Lightheaded:  "occasionally"  Syncope: No. Diplopia:  No. Dyskinesia:  No.  Neuroimaging of the brain has  previously been performed.  It is available for my review today.  Had a CT brain in 06/2017 after a fall.  I have reviewed this.  Intracranially, it was unremarkable.  MRI of the brain was completed in 2015 and demonstrated mild small vessel disease.  PREVIOUS MEDICATIONS: none to date  ALLERGIES:   Allergies  Allergen Reactions  . Ciprofloxacin     Can not take due to hx of Cdiff per patient  . Wasp Venom Other (See Comments)    dizziness  . Fluconazole Rash    CURRENT MEDICATIONS:  Outpatient Encounter Medications as of 12/10/2017  Medication Sig  . acetaminophen (TYLENOL) 500 MG tablet Take  1,000 mg every 6 (six) hours as needed by mouth for moderate pain or headache.  Marland Kitchen aspirin EC 81 MG tablet Take 81 mg by mouth daily.  Marland Kitchen atorvastatin (LIPITOR) 20 MG tablet Take 20 mg every Monday by mouth.   Marland Kitchen CALCIUM PO Take 1,000 mg by mouth daily.  Marland Kitchen CRANBERRY PO Take 300 mg daily by mouth.  . Cyanocobalamin (VITAMIN B-12 PO) Take 1,200 mcg daily by mouth.   . docusate sodium (COLACE) 100 MG capsule Take 100 mg daily by mouth.  . Ergocalciferol (VITAMIN D2) 2000 units TABS Take 2,000 Units daily by mouth.   . estradiol (ESTRACE) 0.1 MG/GM vaginal cream Place 1 Applicatorful vaginally once a week.  . fluticasone furoate-vilanterol (BREO ELLIPTA) 100-25 MCG/INH AEPB Inhale 1 puff into the lungs daily.  . furosemide (LASIX) 20 MG tablet Take 20 mg by mouth every morning.   . hydrocortisone (PROCTOZONE-HC) 2.5 % rectal cream Place 1 application rectally as needed for hemorrhoids or anal itching.  Marland Kitchen ibuprofen (ADVIL,MOTRIN) 200 MG tablet Take 200 mg every 6 (six) hours as needed by mouth for headache or moderate pain.  Marland Kitchen omeprazole (PRILOSEC OTC) 20 MG tablet Take 20 mg daily as needed by mouth (for acid reflux or heartburn).   . potassium chloride (K-DUR,KLOR-CON) 10 MEQ tablet Take 10 mEq by mouth every morning.   . saccharomyces boulardii (FLORASTOR) 250 MG capsule Take  250 mg 2 (two) times daily by mouth.  . EPINEPHrine 0.3 mg/0.3 mL IJ SOAJ injection Inject 0.3 mLs (0.3 mg total) into the muscle once as needed (anaphylaxis). (Patient not taking: Reported on 12/10/2017)   No facility-administered encounter medications on file as of 12/10/2017.     PAST MEDICAL HISTORY:   Past Medical History:  Diagnosis Date  . Anginal pain ([place])   . Angio-edema   . Arthritis   . GERD (gastroesophageal reflux disease)   . Hyperlipemia   . Migraines     PAST SURGICAL HISTORY:   Past Surgical History:  Procedure Laterality Date  . ADENOIDECTOMY    . BACK SURGERY    . HEMORRHOID SURGERY    .  RIGHT/LEFT HEART CATH AND CORONARY ANGIOGRAPHY N/A 06/17/2017   Procedure: RIGHT/LEFT HEART CATH AND CORONARY ANGIOGRAPHY;  Surgeon: Adrian Prows, MD;  Location: [place] [facility];  Service: Cardiovascular;  Laterality: N/A;  . Thoracicc Aortic Tomasa Hose 07/2009    . TONSILLECTOMY    . TOTAL KNEE ARTHROPLASTY Right 09/25/2015   Procedure: TOTAL KNEE ARTHROPLASTY;  Surgeon: Gaynelle Arabian, MD;  Location: [facility];  Service: Orthopedics;  Laterality: Right;    SOCIAL HISTORY:   Social History   Socioeconomic History  . Marital status: Married    Spouse name: Not on file  . Number of children: Not on file  . Years of education: Not on file  . Highest education level: Not on file  Occupational History  . Not on file  Social Needs  . Financial resource strain: Not on file  . Food insecurity:    Worry: Not on file    Inability: Not on file  . Transportation needs:    Medical: Not on file    Non-medical: Not on file  Tobacco Use  . Smoking status: Former Smoker    Types: Cigarettes    Last attempt to quit: 08/15/1963    Years since quitting: 54.3  . Smokeless tobacco: Never Used  Substance and Sexual Activity  . Alcohol use: Yes    Alcohol/week: 0.6 oz    Types: 1 Glasses of wine per week    Comment: almost every night  . Drug use: No  . Sexual activity: Yes  Lifestyle  . Physical activity:    Days per week: Not on file    Minutes per session: Not on file  . Stress: Not on file  Relationships  . Social connections:    Talks on phone: Not on file    Gets together: Not on file    Attends religious service: Not on file    Active member of club or organization: Not on file    Attends meetings of clubs or organizations: Not on file    Relationship status: Not on file  . Intimate partner violence:    Fear of current or ex partner: Not on file    Emotionally abused: Not on file    Physically abused: Not on file    Forced sexual activity: Not on file  Other Topics Concern  . Not  on file  Social History Narrative  . Not on file    FAMILY HISTORY:   Family Status  Relation Name Status  . Mother  Deceased  . Father  Deceased  . Sister  Alive  . Child x3 Alive  . Neg Hx  (Not Specified)    ROS:  A complete 10 system review of systems was obtained and was unremarkable apart from what is  mentioned above.  PHYSICAL EXAMINATION:    VITALS:   Vitals:   12/10/17 0928  BP: 128/80  Pulse: 72  SpO2: 97%  Weight: 159 lb (72.1 kg)  Height: 5' 4.5" (1.638 m)    GEN:  The patient appears stated age and is in NAD. HEENT:  Normocephalic, atraumatic.  The mucous membranes are moist. The superficial temporal arteries are without ropiness or tenderness. CV:  RRR Lungs:  CTAB Neck/HEME:  There are no carotid bruits bilaterally.  Neurological examination:  Orientation: The patient is alert and oriented x3. Fund of knowledge is appropriate.  Recent and remote memory are intact.  Attention and concentration are normal.    Able to name objects and repeat phrases. Cranial nerves: There is good facial symmetry. There is mild facial hypomimia.  Pupils are equal round and reactive to light bilaterally. Fundoscopic exam reveals clear margins bilaterally. Extraocular muscles are intact. The visual fields are full to confrontational testing. The speech is fluent and clear. Soft palate rises symmetrically and there is no tongue deviation. Hearing is intact to conversational tone. Sensation: Sensation is intact to light and pinprick throughout (facial, trunk, extremities). Vibration is intact at the bilateral big toe. There is no extinction with double simultaneous stimulation. There is no sensory dermatomal level identified. Motor: Strength is 5/5 in the bilateral upper and lower extremities.   Shoulder shrug is equal and symmetric.  There is no pronator drift. Deep tendon reflexes: Deep tendon reflexes are 2/4 at the bilateral biceps, triceps, brachioradialis, patella and achilles.  Plantar responses are downgoing bilaterally.  Movement examination: Tone: There is mild increased tone in the RUE, worse with activation.  There is mild increased tone in the RLE.  There is normal tone in the LUE/LLE Abnormal movements: There is RUE resting tremor that increases with distraction;  There is a RLE tremor with distraction Coordination:  There is no decremation with RAM's, with any form of RAMS, including alternating supination and pronation of the forearm, hand opening and closing, finger taps, heel taps and toe taps. Gait and Station: The patient has no difficulty arising out of a deep-seated chair without the use of the hands. The patient's stride length is normal with good arm swing.    Addendum lab work: Lab work was received and dated May 09, 2017.  Sodium was 143, potassium 4.2, chloride 107, CO2 28, BUN 27 and creatinine 0.8.  White blood cells were 5.1, hemoglobin 12.7, hematocrit 37.2 and platelets 167.  TSH was 3.28.  ASSESSMENT/PLAN:  1.  Parkinsonism.  I suspect that this does represent idiopathic Parkinson's disease, Hoehn and Yoehr stage I.    -We discussed the diagnosis as well as pathophysiology of the disease.  We discussed treatment options as well as prognostic indicators.  Patient education was provided.  -We discussed that it used to be thought that levodopa would increase risk of melanoma but now it is believed that Parkinsons itself likely increases risk of melanoma. she is to get regular skin checks.  -Greater than 50% of the 60 minute visit was spent in counseling answering questions and talking about what to expect now as well as in the future.  We talked about medication options as well as potential future surgical options.  We talked about safety in the home.  -We talked about medication options, including quality of life studies that show that most patients feel better if they take medication early.  Ultimately, we decided to hold off on medication since  she only  has minor tremor and is not that bothered by it.  -We discussed community resources in the area including patient support groups and community exercise programs for PD and pt education was provided to the patient.  Encouraged the patient to stay active.  -Patient met with my Parkinson's social worker today.  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  -will try to get labs from PCP  2.  I will see the patient back in 6 months, sooner should new neurologic issues arise.  Patient was offered a second opinion and declined that today.   Cc:  Crist Infante, MD

## 2017-12-10 ENCOUNTER — Ambulatory Visit: Payer: Self-pay

## 2017-12-10 ENCOUNTER — Ambulatory Visit: Payer: Medicare Other | Admitting: Neurology

## 2017-12-10 ENCOUNTER — Encounter: Payer: Self-pay | Admitting: Neurology

## 2017-12-10 VITALS — BP 128/80 | HR 72 | Ht 64.5 in | Wt 159.0 lb

## 2017-12-10 DIAGNOSIS — G20 Parkinson's disease: Secondary | ICD-10-CM | POA: Insufficient documentation

## 2017-12-10 DIAGNOSIS — G20A1 Parkinson's disease without dyskinesia, without mention of fluctuations: Secondary | ICD-10-CM | POA: Insufficient documentation

## 2017-12-10 NOTE — Patient Instructions (Signed)
  [facility]  The [facility] team know that living well with a movement disorder extends far beyond our clinic walls. We are together with you. Our team is passionate about providing resources to you and your loved ones who are living with Parkinson's disease and movement disorders. Participate in these programs and join our community. These resources are free or low cost!   [facility] is adding:   Innovative educational programs for patients and caregivers.   Support groups for patients and caregivers living with Parkinson's disease.   Parkinson's specific exercise programs.   Custom tailored therapeutic programs that will benefit patient's living with Parkinson's disease.   We are in this together. You can help and contribute to grow these programs and resources in our community. 100% of the funds donated to the Movement Disorders Fund stays right here in our community to support patients and their caregivers.  To make a tax deductible contribution:  -ask for a [facility]'s envelope in the office today.  - call the Office of Institutional Advancement at [phone].     Registration is OPEN!    Third Annual Parkinson's Education Symposium   To register: [email or web address]      Search:  Parkinson's Symposium  Register EACH person attending individually Questions: Contact Jessica Thomas, LCSW  [phone] or [email or web address]    

## 2017-12-11 ENCOUNTER — Ambulatory Visit (INDEPENDENT_AMBULATORY_CARE_PROVIDER_SITE_OTHER): Payer: Medicare Other

## 2017-12-11 DIAGNOSIS — T63441D Toxic effect of venom of bees, accidental (unintentional), subsequent encounter: Secondary | ICD-10-CM

## 2017-12-18 ENCOUNTER — Ambulatory Visit (INDEPENDENT_AMBULATORY_CARE_PROVIDER_SITE_OTHER): Payer: Medicare Other

## 2017-12-18 DIAGNOSIS — T63441D Toxic effect of venom of bees, accidental (unintentional), subsequent encounter: Secondary | ICD-10-CM

## 2017-12-25 ENCOUNTER — Ambulatory Visit (INDEPENDENT_AMBULATORY_CARE_PROVIDER_SITE_OTHER): Payer: Medicare Other

## 2017-12-25 DIAGNOSIS — T63441D Toxic effect of venom of bees, accidental (unintentional), subsequent encounter: Secondary | ICD-10-CM | POA: Diagnosis not present

## 2018-01-01 ENCOUNTER — Ambulatory Visit (INDEPENDENT_AMBULATORY_CARE_PROVIDER_SITE_OTHER): Payer: Medicare Other

## 2018-01-01 DIAGNOSIS — T63441D Toxic effect of venom of bees, accidental (unintentional), subsequent encounter: Secondary | ICD-10-CM

## 2018-01-08 ENCOUNTER — Ambulatory Visit (INDEPENDENT_AMBULATORY_CARE_PROVIDER_SITE_OTHER): Payer: Medicare Other

## 2018-01-08 DIAGNOSIS — T63441D Toxic effect of venom of bees, accidental (unintentional), subsequent encounter: Secondary | ICD-10-CM

## 2018-01-15 ENCOUNTER — Ambulatory Visit (INDEPENDENT_AMBULATORY_CARE_PROVIDER_SITE_OTHER): Payer: Medicare Other

## 2018-01-15 DIAGNOSIS — T63441D Toxic effect of venom of bees, accidental (unintentional), subsequent encounter: Secondary | ICD-10-CM

## 2018-01-22 ENCOUNTER — Ambulatory Visit (INDEPENDENT_AMBULATORY_CARE_PROVIDER_SITE_OTHER): Payer: Medicare Other | Admitting: *Deleted

## 2018-01-22 DIAGNOSIS — T63441D Toxic effect of venom of bees, accidental (unintentional), subsequent encounter: Secondary | ICD-10-CM | POA: Diagnosis not present

## 2018-02-03 ENCOUNTER — Ambulatory Visit (INDEPENDENT_AMBULATORY_CARE_PROVIDER_SITE_OTHER): Payer: Medicare Other | Admitting: *Deleted

## 2018-02-03 DIAGNOSIS — T63441D Toxic effect of venom of bees, accidental (unintentional), subsequent encounter: Secondary | ICD-10-CM

## 2018-02-26 ENCOUNTER — Ambulatory Visit (INDEPENDENT_AMBULATORY_CARE_PROVIDER_SITE_OTHER): Payer: Medicare Other | Admitting: *Deleted

## 2018-02-26 DIAGNOSIS — T63441D Toxic effect of venom of bees, accidental (unintentional), subsequent encounter: Secondary | ICD-10-CM | POA: Diagnosis not present

## 2018-03-26 ENCOUNTER — Ambulatory Visit (INDEPENDENT_AMBULATORY_CARE_PROVIDER_SITE_OTHER): Payer: Medicare Other | Admitting: *Deleted

## 2018-03-26 DIAGNOSIS — T63441D Toxic effect of venom of bees, accidental (unintentional), subsequent encounter: Secondary | ICD-10-CM

## 2018-04-06 IMAGING — CR DG KNEE COMPLETE 4+V*L*
4 series · 4 of 4 positions shown · non-contrast
Comparison: None.

CLINICAL DATA: Fall last night with left knee pain.

EXAM:
LEFT KNEE - COMPLETE 4+ VIEW

[t knee ap left]
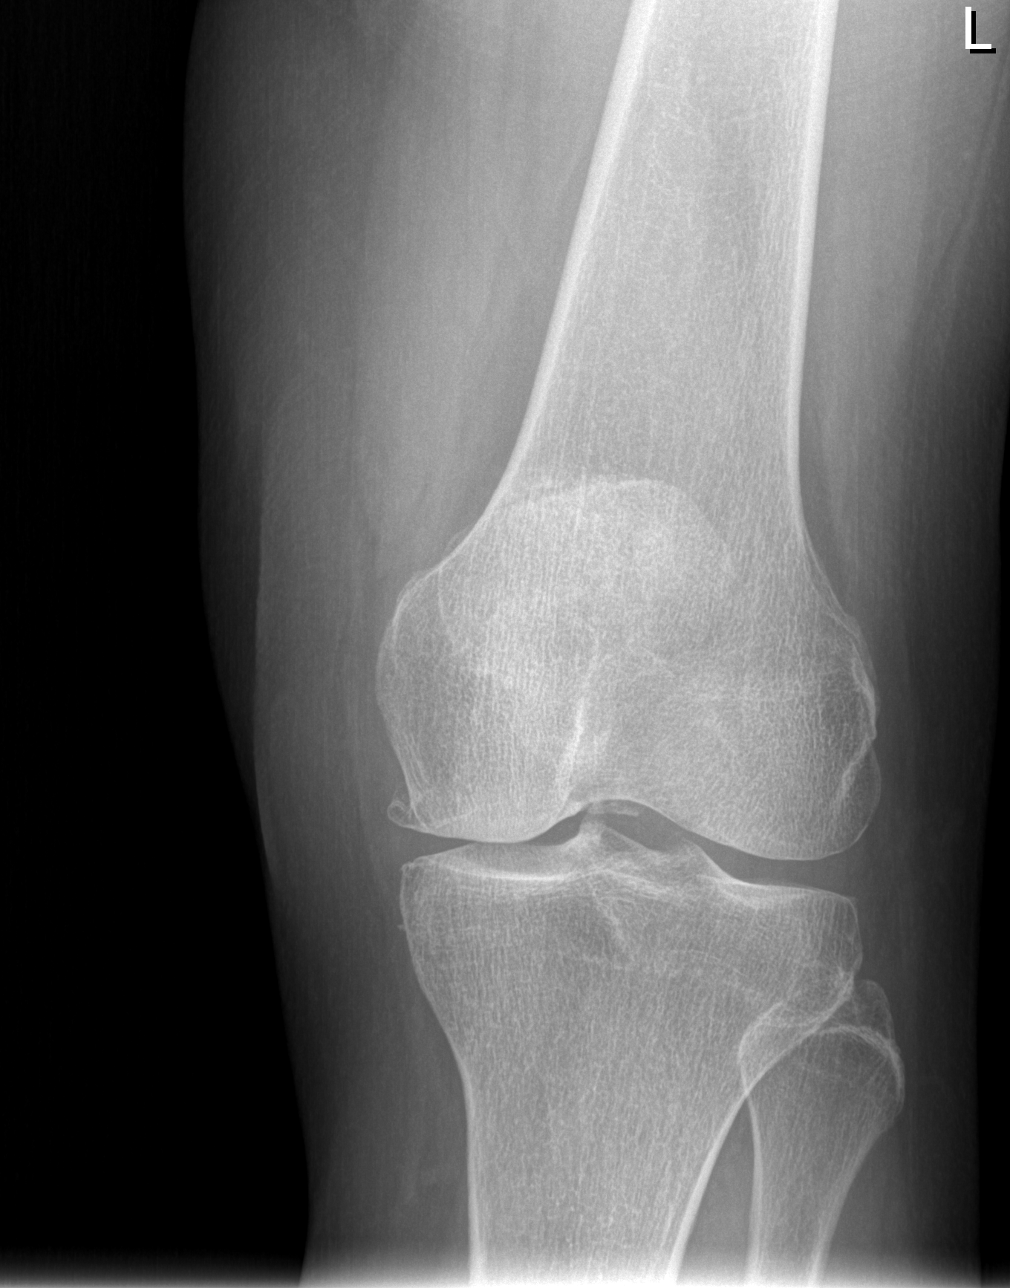

[t knee oblique left (1 of 2)]
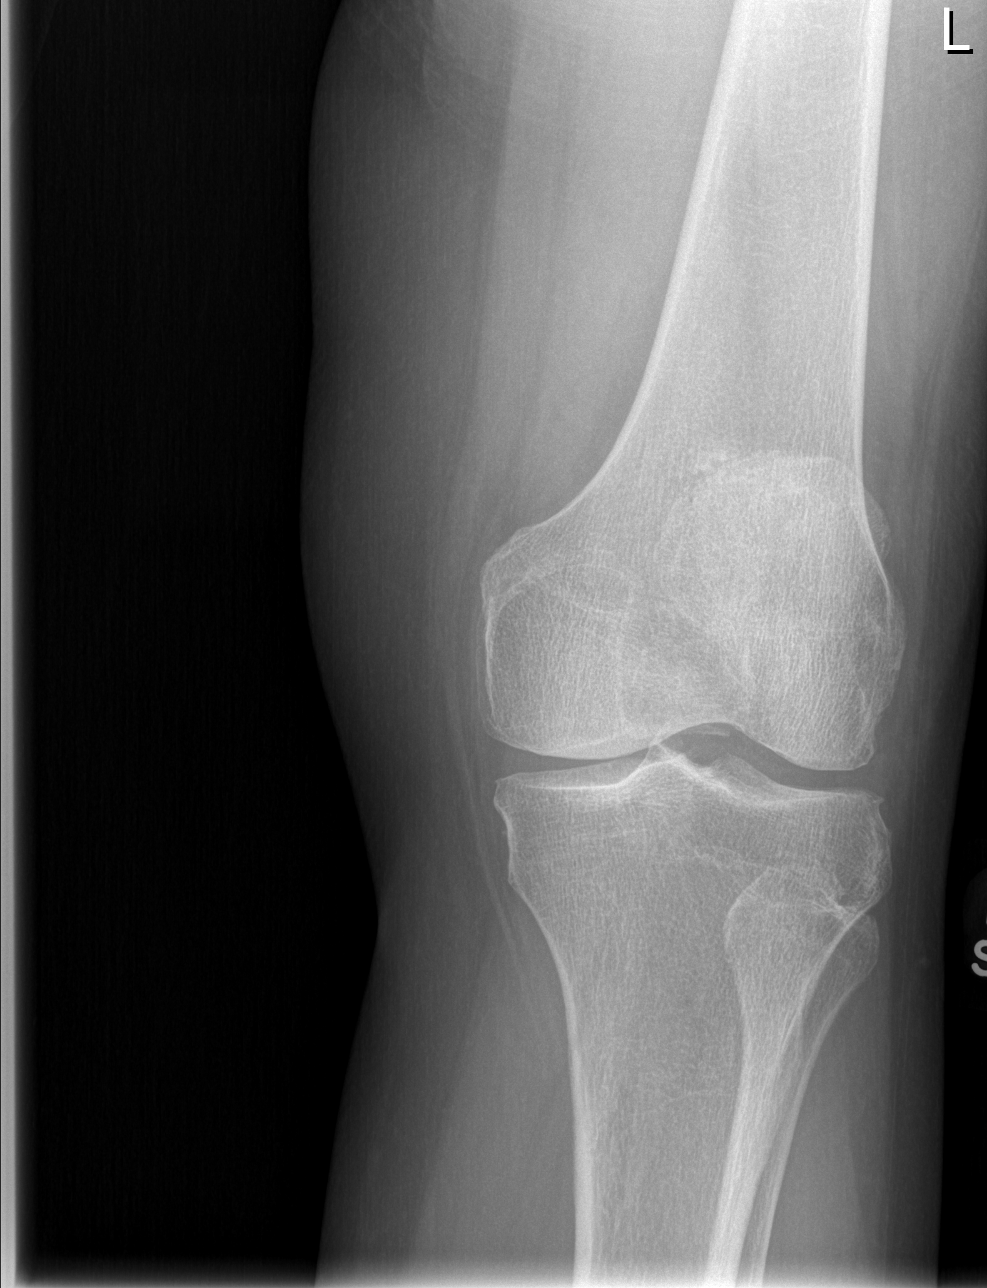

[t knee oblique left (2 of 2)]
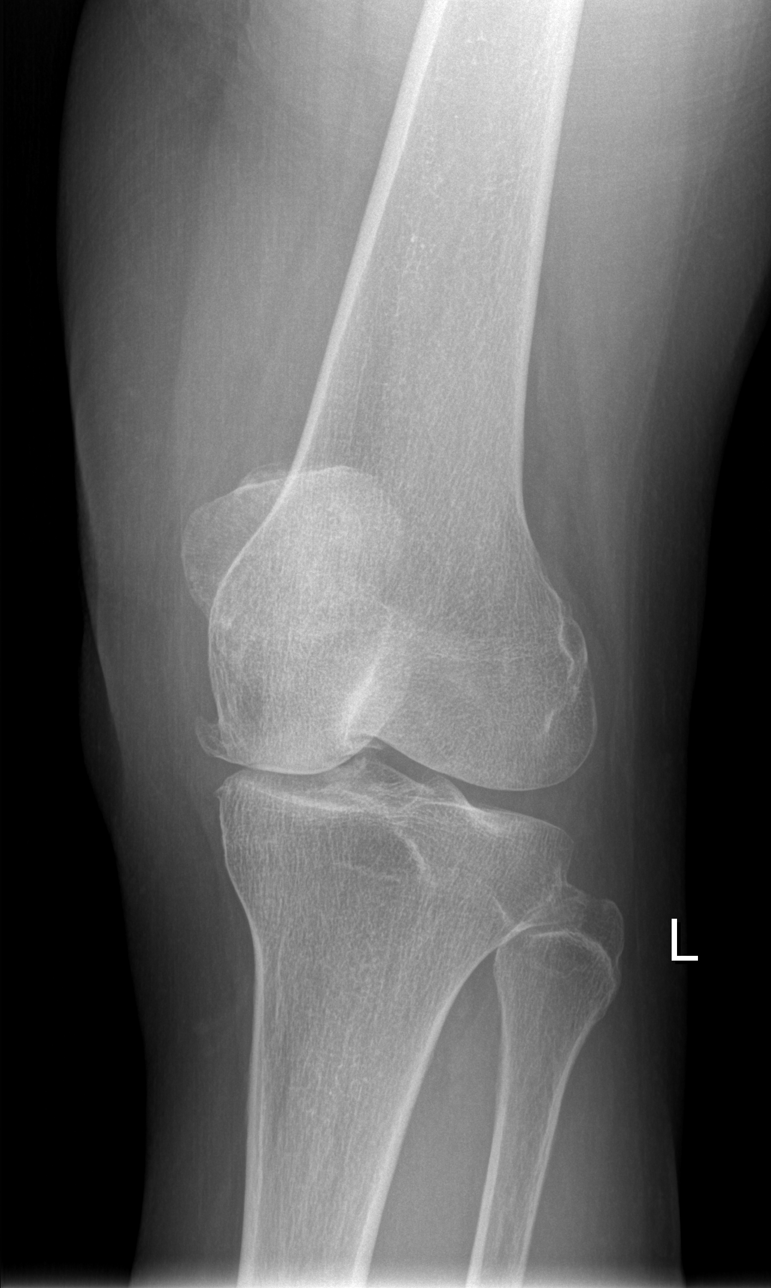

[t knee lat left]
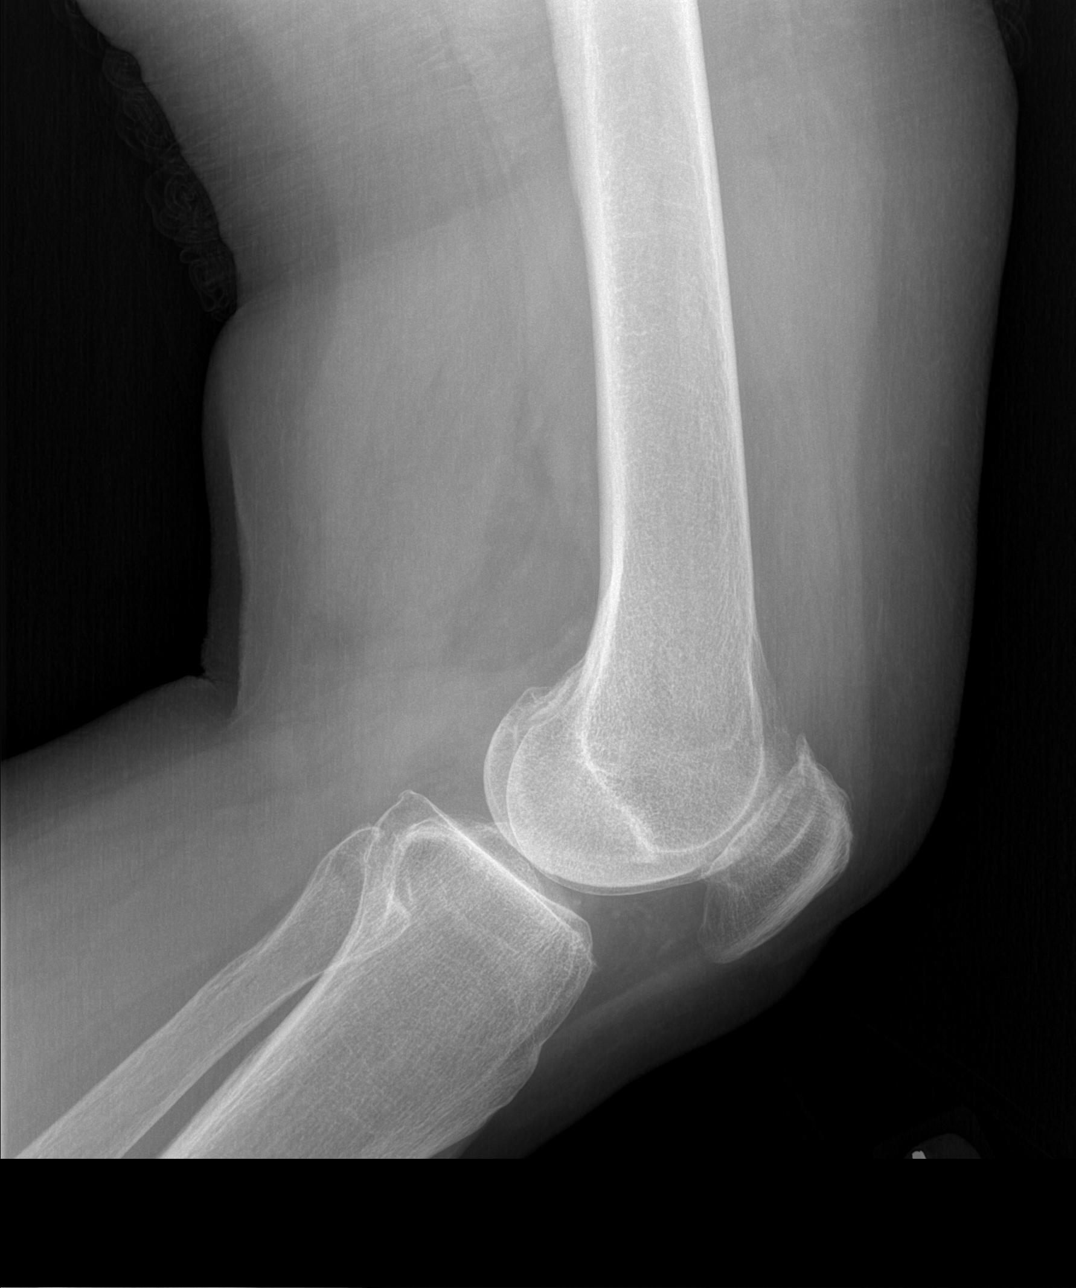

[4 of 4 positions shown; findings below may reference images not displayed]

FINDINGS: There mild tricompartmental osteoarthritic changes. No evidence of
effusion. There is a 1 cm linear bony fragment in the region of the
intracondylar notch not seen on the lateral film. This may represent
a chip fracture versus small loose body.
IMPRESSION: 1 cm fragment adjacent the intercondylar notch which may represent a
loose body or possible chip fracture.

Mild tricompartmental osteoarthritis.

## 2018-04-23 ENCOUNTER — Ambulatory Visit (INDEPENDENT_AMBULATORY_CARE_PROVIDER_SITE_OTHER): Payer: Medicare Other

## 2018-04-23 DIAGNOSIS — T63441D Toxic effect of venom of bees, accidental (unintentional), subsequent encounter: Secondary | ICD-10-CM | POA: Diagnosis not present

## 2018-04-28 ENCOUNTER — Other Ambulatory Visit: Payer: Self-pay | Admitting: Internal Medicine

## 2018-04-28 DIAGNOSIS — R221 Localized swelling, mass and lump, neck: Secondary | ICD-10-CM

## 2018-04-28 DIAGNOSIS — M542 Cervicalgia: Secondary | ICD-10-CM

## 2018-04-28 DIAGNOSIS — M7580 Other shoulder lesions, unspecified shoulder: Secondary | ICD-10-CM

## 2018-05-07 ENCOUNTER — Ambulatory Visit
Admission: RE | Admit: 2018-05-07 | Discharge: 2018-05-07 | Disposition: A | Discharge status: Home / Self Care | Payer: Medicare Other | Source: Ambulatory Visit | Attending: Internal Medicine | Admitting: Internal Medicine

## 2018-05-07 DIAGNOSIS — M7580 Other shoulder lesions, unspecified shoulder: Secondary | ICD-10-CM

## 2018-05-07 DIAGNOSIS — M542 Cervicalgia: Secondary | ICD-10-CM

## 2018-05-07 DIAGNOSIS — R221 Localized swelling, mass and lump, neck: Secondary | ICD-10-CM

## 2018-05-07 MED ORDER — GADOBENATE DIMEGLUMINE 529 MG/ML IV SOLN
14.0000 mL | Freq: Once | INTRAVENOUS | Status: AC | PRN
  Administered 2018-05-07: 14 mL via INTRAVENOUS

## 2018-05-21 ENCOUNTER — Ambulatory Visit (INDEPENDENT_AMBULATORY_CARE_PROVIDER_SITE_OTHER): Payer: Medicare Other

## 2018-05-21 DIAGNOSIS — T63441D Toxic effect of venom of bees, accidental (unintentional), subsequent encounter: Secondary | ICD-10-CM | POA: Diagnosis not present

## 2018-06-10 NOTE — Progress Notes (Signed)
Linda Lee was seen today in the movement disorders clinic for follow-up of Parkinson's disease that was diagnosed last visit.  She is on no medication, which has been her choice.  She reports today that she is having more tremor, esp in the R foot.  She is having "locking, like a cramp" in the fingers in the hands.  Pt denies falls.  Pt denies lightheadedness, near syncope.  No hallucinations.  Mood has been good.  She is doing [employer] cycle class 3 days per week.    PREVIOUS MEDICATIONS: none to date  ALLERGIES:   Allergies  Allergen Reactions  . Ciprofloxacin     Can not take due to hx of Cdiff per patient  . Wasp Venom Other (See Comments)    dizziness  . Fluconazole Rash    CURRENT MEDICATIONS:  Outpatient Encounter Medications as of 06/12/2018  Medication Sig  . aspirin EC 81 MG tablet Take 81 mg by mouth daily.  Marland Kitchen atorvastatin (LIPITOR) 20 MG tablet Take 20 mg every Monday by mouth.   Marland Kitchen CALCIUM PO Take 1,000 mg by mouth daily.  Marland Kitchen CRANBERRY PO Take 300 mg daily by mouth.  . Cyanocobalamin (VITAMIN B-12 PO) Take 1,200 mcg daily by mouth.   . docusate sodium (COLACE) 100 MG capsule Take 100 mg daily by mouth.  . Ergocalciferol (VITAMIN D2) 2000 units TABS Take 2,000 Units daily by mouth.   . estradiol (ESTRACE) 0.1 MG/GM vaginal cream Place 1 Applicatorful vaginally once a week.  . fluticasone furoate-vilanterol (BREO ELLIPTA) 100-25 MCG/INH AEPB Inhale 1 puff into the lungs daily.  . furosemide (LASIX) 20 MG tablet Take 20 mg by mouth every morning.   . hydrocortisone (PROCTOZONE-HC) 2.5 % rectal cream Place 1 application rectally as needed for hemorrhoids or anal itching.  Marland Kitchen omeprazole (PRILOSEC OTC) 20 MG tablet Take 20 mg daily as needed by mouth (for acid reflux or heartburn).   . potassium chloride (K-DUR,KLOR-CON) 10 MEQ tablet Take 10 mEq by mouth every morning.   . saccharomyces boulardii (FLORASTOR) 250 MG capsule Take 250 mg 2 (two) times daily by mouth.  .  EPINEPHrine 0.3 mg/0.3 mL IJ SOAJ injection Inject 0.3 mLs (0.3 mg total) into the muscle once as needed (anaphylaxis). (Patient not taking: Reported on 12/10/2017)  . [DISCONTINUED] acetaminophen (TYLENOL) 500 MG tablet Take 1,000 mg every 6 (six) hours as needed by mouth for moderate pain or headache.  . [DISCONTINUED] ibuprofen (ADVIL,MOTRIN) 200 MG tablet Take 200 mg every 6 (six) hours as needed by mouth for headache or moderate pain.   No facility-administered encounter medications on file as of 06/12/2018.     PAST MEDICAL HISTORY:   Past Medical History:  Diagnosis Date  . Anginal pain (HCC)   . Angio-edema   . Arthritis   . GERD (gastroesophageal reflux disease)   . Hyperlipemia   . Migraines     PAST SURGICAL HISTORY:   Past Surgical History:  Procedure Laterality Date  . ADENOIDECTOMY    . BACK SURGERY    . HEMORRHOID SURGERY    . RIGHT/LEFT HEART CATH AND CORONARY ANGIOGRAPHY N/A 06/17/2017   Procedure: RIGHT/LEFT HEART CATH AND CORONARY ANGIOGRAPHY;  Surgeon: Yates Decamp, MD;  Location: [facility];  Service: Cardiovascular;  Laterality: N/A;  . 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     denies surgery for this  . TONSILLECTOMY    . TOTAL KNEE ARTHROPLASTY Right 09/25/2015   Procedure: TOTAL KNEE ARTHROPLASTY;  Surgeon: Homero Fellers  Aluisio, MD;  Location: [facility];  Service: Orthopedics;  Laterality: Right;    SOCIAL HISTORY:   Social History   Socioeconomic History  . Marital status: Married    Spouse name: Not on file  . Number of children: Not on file  . Years of education: Not on file  . Highest education level: Not on file  Occupational History  . Occupation: retired    Comment: Runner, broadcasting/film/video  Social Needs  . Financial resource strain: Not on file  . Food insecurity:    Worry: Not on file    Inability: Not on file  . Transportation needs:    Medical: Not on file    Non-medical: Not on file  Tobacco Use  . Smoking status: Former Smoker    Types: Cigarettes     Last attempt to quit: 08/15/1963    Years since quitting: 54.8  . Smokeless tobacco: Never Used  Substance and Sexual Activity  . Alcohol use: Yes    Alcohol/week: 1.0 standard drinks    Types: 1 Glasses of wine per week    Comment: q hs  . Drug use: No  . Sexual activity: Yes  Lifestyle  . Physical activity:    Days per week: Not on file    Minutes per session: Not on file  . Stress: Not on file  Relationships  . Social connections:    Talks on phone: Not on file    Gets together: Not on file    Attends religious service: Not on file    Active member of club or organization: Not on file    Attends meetings of clubs or organizations: Not on file    Relationship status: Not on file  . Intimate partner violence:    Fear of current or ex partner: Not on file    Emotionally abused: Not on file    Physically abused: Not on file    Forced sexual activity: Not on file  Other Topics Concern  . Not on file  Social History Narrative  . Not on file    FAMILY HISTORY:   Family Status  Relation Name Status  . Mother  Deceased  . Father  Deceased  . Sister  Alive  . Child x3 Alive  . Neg Hx  (Not Specified)    ROS:  Review of Systems  Constitutional: Negative.   HENT: Negative.   Eyes: Negative.   Cardiovascular: Negative.   Gastrointestinal: Negative.   Genitourinary: Negative.   Skin: Negative.     PHYSICAL EXAMINATION:    VITALS:   Vitals:   06/12/18 1026  BP: 114/60  Pulse: 70  SpO2: 98%  Weight: 158 lb (71.7 kg)  Height: 5\' 5"  (1.651 m)    GEN:  The patient appears stated age and is in NAD. GEN:  The patient appears stated age and is in NAD. HEENT:  Normocephalic, atraumatic.  The mucous membranes are moist. The superficial temporal arteries are without ropiness or tenderness. CV:  RRR Lungs:  CTAB Neck/HEME:  There are no carotid bruits bilaterally.  Neurological examination:  Orientation: The patient is alert and oriented x3. Cranial nerves: There  is good facial symmetry. The speech is fluent and clear. Soft palate rises symmetrically and there is no tongue deviation. Hearing is intact to conversational tone. Sensation: Sensation is intact to light touch throughout Motor: Strength is 5/5 in the bilateral upper and lower extremities.   Shoulder shrug is equal and symmetric.  There is no pronator drift.  Movement examination: Tone: There is mild increased tone in the RUE, worse with activation.  There is mod increased tone in the RLE.  There is normal tone in the LUE/LLE Abnormal movements: There is RUE/RLE tremor Coordination:  There is mild decremation with any form of RAMS, including alternating supination and pronation of the forearm, hand opening and closing, finger taps, heel taps and toe taps on the right.  There is mild trouble alternation of supination/pronation of the forearm on the left. Gait and Station: The patient has no difficulty arising out of a deep-seated chair without the use of the hands. The patient's stride length is normal with good arm swing.    Addendum lab work: Lab work was received and dated May 09, 2017.  Sodium was 143, potassium 4.2, chloride 107, CO2 28, BUN 27 and creatinine 0.8.  White blood cells were 5.1, hemoglobin 12.7, hematocrit 37.2 and platelets 167.  TSH was 3.28.  ASSESSMENT/PLAN:  1.  Parkinsons disease  -she is describing dystonia of the RUE and has more mod rigidity of the RLE.  I think that it is time to start medication.  She agrees.  Will start carbidopa/levodopa 25/100 and work to 1 po tid.  Risks, benefits, side effects and alternative therapies were discussed.  The opportunity to ask questions was given and they were answered to the best of my ability.  The patient expressed understanding and willingness to follow the outlined treatment protocols.  -continue to f/u with dermatology.  We discussed that it used to be thought that levodopa would increase risk of melanoma but now it is  believed that Parkinsons itself likely increases risk of melanoma. she is to get regular skin checks and she just saw dermatology  -continue to exercise.  Discussed role of exercise as it relates to PD  2. Follow up is anticipated in the next few months, sooner should new neurologic issues arise.  Much greater than 50% of this visit was spent in counseling and coordinating care.  Total face to face time:  4 months   Cc:  Rodrigo Ran, MD

## 2018-06-12 ENCOUNTER — Encounter: Payer: Self-pay | Admitting: Neurology

## 2018-06-12 ENCOUNTER — Ambulatory Visit: Payer: Medicare Other | Admitting: Neurology

## 2018-06-12 VITALS — BP 114/60 | HR 70 | Ht 65.0 in | Wt 158.0 lb

## 2018-06-12 DIAGNOSIS — G20 Parkinson's disease: Secondary | ICD-10-CM | POA: Diagnosis not present

## 2018-06-12 MED ORDER — CARBIDOPA-LEVODOPA 25-100 MG PO TABS: 1.0000 | ORAL_TABLET | Freq: Three times a day (TID) | ORAL | 1 refills | Status: DC

## 2018-06-12 NOTE — Patient Instructions (Signed)
Start Carbidopa Levodopa as follows:  Take 1/2 tablet three times daily, at least 30 minutes before meals, for one week  Then take 1/2 tablet in the morning, 1/2 tablet in the afternoon, 1 tablet in the evening, at least 30 minutes before meals, for one week  Then take 1/2 tablet in the morning, 1 tablet in the afternoon, 1 tablet in the evening, at least 30 minutes before meals, for one week  Then take 1 tablet three times daily, at least 30 minutes before meals  

## 2018-06-18 ENCOUNTER — Ambulatory Visit (INDEPENDENT_AMBULATORY_CARE_PROVIDER_SITE_OTHER): Payer: Medicare Other

## 2018-06-18 DIAGNOSIS — T63441D Toxic effect of venom of bees, accidental (unintentional), subsequent encounter: Secondary | ICD-10-CM

## 2018-06-22 ENCOUNTER — Other Ambulatory Visit: Payer: Self-pay | Admitting: Cardiology

## 2018-06-22 DIAGNOSIS — I7121 Aneurysm of the ascending aorta, without rupture: Secondary | ICD-10-CM

## 2018-06-22 DIAGNOSIS — I712 Thoracic aortic aneurysm, without rupture: Secondary | ICD-10-CM

## 2018-07-16 ENCOUNTER — Ambulatory Visit (INDEPENDENT_AMBULATORY_CARE_PROVIDER_SITE_OTHER): Payer: Medicare Other | Admitting: *Deleted

## 2018-07-16 DIAGNOSIS — T63441D Toxic effect of venom of bees, accidental (unintentional), subsequent encounter: Secondary | ICD-10-CM

## 2018-08-11 ENCOUNTER — Emergency Department (HOSPITAL_COMMUNITY)
Admission: EM | Admit: 2018-08-11 | Discharge: 2018-08-11 | Disposition: A | Discharge status: Home / Self Care | Payer: Medicare Other | Attending: Emergency Medicine | Admitting: Emergency Medicine

## 2018-08-11 ENCOUNTER — Emergency Department (HOSPITAL_COMMUNITY): Payer: Medicare Other

## 2018-08-11 ENCOUNTER — Encounter (HOSPITAL_COMMUNITY): Payer: Self-pay | Admitting: Emergency Medicine

## 2018-08-11 ENCOUNTER — Other Ambulatory Visit: Payer: Self-pay

## 2018-08-11 DIAGNOSIS — Z96651 Presence of right artificial knee joint: Secondary | ICD-10-CM | POA: Insufficient documentation

## 2018-08-11 DIAGNOSIS — G459 Transient cerebral ischemic attack, unspecified: Secondary | ICD-10-CM | POA: Diagnosis not present

## 2018-08-11 DIAGNOSIS — I5032 Chronic diastolic (congestive) heart failure: Secondary | ICD-10-CM | POA: Diagnosis not present

## 2018-08-11 DIAGNOSIS — I7121 Aneurysm of the ascending aorta, without rupture: Secondary | ICD-10-CM

## 2018-08-11 DIAGNOSIS — Z79899 Other long term (current) drug therapy: Secondary | ICD-10-CM | POA: Diagnosis not present

## 2018-08-11 DIAGNOSIS — Z87891 Personal history of nicotine dependence: Secondary | ICD-10-CM | POA: Insufficient documentation

## 2018-08-11 DIAGNOSIS — G20 Parkinson's disease: Secondary | ICD-10-CM | POA: Diagnosis not present

## 2018-08-11 DIAGNOSIS — I712 Thoracic aortic aneurysm, without rupture: Secondary | ICD-10-CM

## 2018-08-11 DIAGNOSIS — R4701 Aphasia: Secondary | ICD-10-CM | POA: Diagnosis present

## 2018-08-11 LAB — DIFFERENTIAL
Abs Immature Granulocytes: 0.01 10*3/uL (ref 0.00–0.07)
Basophils Absolute: 0 10*3/uL (ref 0.0–0.1)
Basophils Relative: 1 %
Eosinophils Absolute: 0.1 10*3/uL (ref 0.0–0.5)
Eosinophils Relative: 1 %
Immature Granulocytes: 0 %
Lymphocytes Relative: 27 %
Lymphs Abs: 1.1 10*3/uL (ref 0.7–4.0)
Monocytes Absolute: 0.3 10*3/uL (ref 0.1–1.0)
Monocytes Relative: 6 %
Neutro Abs: 2.8 10*3/uL (ref 1.7–7.7)
Neutrophils Relative %: 65 %

## 2018-08-11 LAB — COMPREHENSIVE METABOLIC PANEL
ALT: <5 U/L (ref 0–44)
AST: 15 U/L (ref 15–41)
Albumin: 3.9 g/dL (ref 3.5–5.0)
Alkaline Phosphatase: 89 U/L (ref 38–126)
Anion gap: 9 (ref 5–15)
BUN: 18 mg/dL (ref 8–23)
CO2: 23 mmol/L (ref 22–32)
Calcium: 9 mg/dL (ref 8.9–10.3)
Chloride: 109 mmol/L (ref 98–111)
Creatinine, Ser: 0.91 mg/dL (ref 0.44–1.00)
GFR calc Af Amer: >60 mL/min (ref >60)
GFR calc non Af Amer: 60 mL/min — ABNORMAL LOW (ref >60)
Glucose, Bld: 92 mg/dL (ref 70–99)
Potassium: 3.8 mmol/L (ref 3.5–5.1)
Sodium: 141 mmol/L (ref 135–145)
Total Bilirubin: 0.8 mg/dL (ref 0.3–1.2)
Total Protein: 6.9 g/dL (ref 6.5–8.1)

## 2018-08-11 LAB — I-STAT CHEM 8, ED
BUN: 20 mg/dL (ref 8–23)
Calcium, Ion: 1.16 mmol/L (ref 1.15–1.40)
Chloride: 107 mmol/L (ref 98–111)
Creatinine, Ser: 0.9 mg/dL (ref 0.44–1.00)
Glucose, Bld: 86 mg/dL (ref 70–99)
HCT: 40 % (ref 36.0–46.0)
Hemoglobin: 13.6 g/dL (ref 12.0–15.0)
Potassium: 3.9 mmol/L (ref 3.5–5.1)
Sodium: 142 mmol/L (ref 135–145)
TCO2: 25 mmol/L (ref 22–32)

## 2018-08-11 LAB — CBC
HCT: 41.6 % (ref 36.0–46.0)
Hemoglobin: 13.5 g/dL (ref 12.0–15.0)
MCH: 31 pg (ref 26.0–34.0)
MCHC: 32.5 g/dL (ref 30.0–36.0)
MCV: 95.4 fL (ref 80.0–100.0)
Platelets: 157 10*3/uL (ref 150–400)
RBC: 4.36 MIL/uL (ref 3.87–5.11)
RDW: 13.1 % (ref 11.5–15.5)
WBC: 4.3 10*3/uL (ref 4.0–10.5)
nRBC: 0 % (ref 0.0–0.2)

## 2018-08-11 LAB — I-STAT TROPONIN, ED: Troponin i, poc: 0 ng/mL (ref 0.00–0.08)

## 2018-08-11 LAB — CBG MONITORING, ED: Glucose-Capillary: 81 mg/dL (ref 70–99)

## 2018-08-11 LAB — APTT: aPTT: 32 seconds (ref 24–36)

## 2018-08-11 LAB — PROTIME-INR
INR: 1.02
Prothrombin Time: 13.3 seconds (ref 11.4–15.2)

## 2018-08-11 LAB — HEMOGLOBIN A1C
Hgb A1c MFr Bld: 5.2 % (ref 4.8–5.6)
Mean Plasma Glucose: 102.54 mg/dL

## 2018-08-11 MED ORDER — ACETAMINOPHEN 500 MG PO TABS: 1000.0000 mg | ORAL_TABLET | Freq: Once | ORAL | Status: DC

## 2018-08-11 MED ORDER — ATORVASTATIN CALCIUM 20 MG PO TABS: 20.0000 mg | ORAL_TABLET | Freq: Every day | ORAL | 0 refills | Status: DC

## 2018-08-11 MED ORDER — ACETAMINOPHEN 500 MG PO TABS
1000.0000 mg | ORAL_TABLET | Freq: Once | ORAL | Status: AC
  Administered 2018-08-11: 1000 mg via ORAL
  Filled 2018-08-11: qty 2

## 2018-08-11 MED ORDER — METOCLOPRAMIDE HCL 5 MG/ML IJ SOLN
10.0000 mg | Freq: Once | INTRAMUSCULAR | Status: DC
  Filled 2018-08-11: qty 2

## 2018-08-11 MED ORDER — DIPHENHYDRAMINE HCL 50 MG/ML IJ SOLN
25.0000 mg | Freq: Once | INTRAMUSCULAR | Status: DC
  Filled 2018-08-11: qty 1

## 2018-08-11 MED ORDER — IOPAMIDOL (ISOVUE-370) INJECTION 76%
INTRAVENOUS | Status: AC
  Administered 2018-08-11: 100 mL
  Filled 2018-08-11: qty 100

## 2018-08-11 MED ORDER — LACTATED RINGERS IV BOLUS: 1000.0000 mL | Freq: Once | INTRAVENOUS | Status: DC

## 2018-08-11 MED ORDER — DEXAMETHASONE SODIUM PHOSPHATE 10 MG/ML IJ SOLN
10.0000 mg | Freq: Once | INTRAMUSCULAR | Status: DC
  Filled 2018-08-11: qty 1

## 2018-08-11 MED ORDER — ASPIRIN EC 325 MG PO TBEC: 325.0000 mg | DELAYED_RELEASE_TABLET | Freq: Every day | ORAL | 0 refills | Status: DC

## 2018-08-11 NOTE — ED Triage Notes (Signed)
Pt states yesterday at 130pm she had an episode where she felt unable to speak- and when she could talk it was very garbled and she was unable to remember her neighbors name.  Pt states this lasted for 1 hour and everything went away except for a constant headache since this episode.   Pt is alert and ox4. Clear speech. Pt has equal grip with no drift. Steady gait on arrival.

## 2018-08-11 NOTE — ED Provider Notes (Signed)
[facility] Provider Note   CSN: [id number] Arrival date & time: 08/11/18  1024     History   Chief Complaint Chief Complaint  Patient presents with  . Aphasia    HPI Linda Lee is a 83 y.o. female.   Neurologic Problem  This is a new problem. The current episode started yesterday. The problem occurs rarely. The problem has been resolved. Associated symptoms include headaches. Nothing aggravates the symptoms. Nothing relieves the symptoms. She has tried nothing for the symptoms.    Past Medical History:  Diagnosis Date  . Anginal pain (HCC)   . Angio-edema   . Arthritis   . GERD (gastroesophageal reflux disease)   . Hyperlipemia   . Migraines     Patient Active Problem List   Diagnosis Date Noted  . Parkinson's disease (HCC) 12/10/2017  . Dyspnea on exertion 06/17/2017  . Toxic effect of venom of bees, unintentional 06/03/2017  . OA (osteoarthritis) of knee 09/25/2015  . UTI (lower urinary tract infection) 09/04/2012  . Diastolic CHF, chronic (HCC) 09/04/2012  . Subjective visual disturbance, right eye 09/03/2012  . Numbness around mouth 09/03/2012  . Vision disturbance 09/03/2012  . THORACIC AORTIC ANEURYSM 07/06/2009  . Atypical chest pain 07/06/2009  . HYPERLIPIDEMIA 06/30/2009  . GERD 06/30/2009  . LEG CRAMPS 06/30/2009    Past Surgical History:  Procedure Laterality Date  . ADENOIDECTOMY    . BACK SURGERY    . HEMORRHOID SURGERY    . RIGHT/LEFT HEART CATH AND CORONARY ANGIOGRAPHY N/A 06/17/2017   Procedure: RIGHT/LEFT HEART CATH AND CORONARY ANGIOGRAPHY;  Surgeon: Yates DecampGanji, Jay, MD;  Location: [facility] INVASIVE CV LAB;  Service: Cardiovascular;  Laterality: N/A;  . 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 TONSILLECTOMY    . TOTAL KNEE ARTHROPLASTY Right 09/25/2015   Procedure: TOTAL KNEE ARTHROPLASTY;  Surgeon: Ollen GrossFrank Aluisio, MD;  Location: [facility] ORS;  Service: Orthopedics;  Laterality: Right;       OB History   No obstetric history on file.      Home Medications    Prior to Admission medications   Medication Sig Start Date End Date Taking? Authorizing Provider  carbidopa-levodopa (SINEMET IR) 25-100 MG tablet Take 1 tablet by mouth 3 (three) times daily. 06/12/18  Yes Tat, Octaviano Battyebecca S, DO  CRANBERRY PO Take 300 mg daily by mouth.   Yes [provider]  Cyanocobalamin (VITAMIN B-12 PO) Take 1,200 mcg daily by mouth.    Yes [provider]  docusate sodium (COLACE) 100 MG capsule Take 100 mg by mouth daily as needed for mild constipation.    Yes [provider]  EPINEPHrine 0.3 mg/0.3 mL IJ SOAJ injection Inject 0.3 mLs (0.3 mg total) into the muscle once as needed (anaphylaxis). 04/01/17  Yes Charlynne PanderYao, David Hsienta, MD  Ergocalciferol (VITAMIN D2) 2000 units TABS Take 2,000 Units daily by mouth.    Yes [provider]  estradiol (ESTRACE) 0.1 MG/GM vaginal cream Place 1 Applicatorful vaginally 2 (two) times a week.    Yes [provider]  hydrocortisone (PROCTOZONE-HC) 2.5 % rectal cream Place 1 application rectally as needed for hemorrhoids or anal itching.   Yes [provider]  losartan (COZAAR) 25 MG tablet Take 25 mg by mouth daily.   Yes [provider]  omeprazole (PRILOSEC) 40 MG capsule Take 40 mg by mouth daily. 06/15/18  Yes [provider]  saccharomyces boulardii (FLORASTOR) 250 MG capsule Take  250 mg by mouth daily.    Yes [provider]  aspirin EC 325 MG tablet Take 1 tablet (325 mg total) by mouth daily. 08/11/18   Danniell Rotundo, Barbara Cower, MD  atorvastatin (LIPITOR) 20 MG tablet Take 1 tablet (20 mg total) by mouth daily after supper. 08/11/18   Peniel Hass, Barbara Cower, MD    Family History Family History  Problem Relation Age of Onset  . Prostate cancer Father   . Epilepsy Child   . Allergic rhinitis Neg Hx   . Angioedema Neg Hx   . Asthma Neg Hx   . Eczema Neg Hx   . Immunodeficiency Neg Hx   .  Urticaria Neg Hx     Social History Social History   Tobacco Use  . Smoking status: Former Smoker    Types: Cigarettes    Last attempt to quit: 08/15/1963    Years since quitting: 55.0  . Smokeless tobacco: Never Used  Substance Use Topics  . Alcohol use: Yes    Alcohol/week: 1.0 standard drinks    Types: 1 Glasses of wine per week    Comment: q hs  . Drug use: No     Allergies   Ciprofloxacin; Wasp venom; and Fluconazole   Review of Systems Review of Systems  Neurological: Positive for speech difficulty and headaches.  All other systems reviewed and are negative.    Physical Exam Updated Vital Signs BP (!) 144/72 (BP Location: Right Arm)   Pulse 60   Temp 97.8 F (36.6 C) (Oral)   Resp 16   Ht 5' 4.5" (1.638 m)   Wt 70.3 kg   SpO2 99%   BMI 26.19 kg/m   Physical Exam Vitals signs and nursing note reviewed.  Constitutional:      Appearance: She is well-developed.  HENT:     Head: Normocephalic and atraumatic.  Eyes:     Extraocular Movements: Extraocular movements intact.     Conjunctiva/sclera: Conjunctivae normal.  Neck:     Musculoskeletal: Normal range of motion.  Cardiovascular:     Rate and Rhythm: Normal rate and regular rhythm.     Pulses: Normal pulses.  Pulmonary:     Effort: Pulmonary effort is normal. No respiratory distress.     Breath sounds: No stridor.  Abdominal:     General: There is no distension.  Neurological:     General: No focal deficit present.     Mental Status: She is alert.     Comments: No altered mental status, able to give full seemingly accurate history.  Face is symmetric, EOM's intact, pupils equal and reactive, vision intact, tongue and uvula midline without deviation. Upper and Lower extremity motor 5/5, intact pain perception in distal extremities, 2+ reflexes in biceps, patella and achilles tendons. Able to perform finger to nose normal with both hands. Walks without assistance or evident ataxia.        ED  Treatments / Results  Labs (all labs ordered are listed, but only abnormal results are displayed) Labs Reviewed  COMPREHENSIVE METABOLIC PANEL - Abnormal; Notable for the following components:      Result Value   GFR calc non Af Amer 60 (*)    All other components within normal limits  I-STAT CHEM 8, ED - Abnormal; Notable for the following components:   Sodium 131 (*)    Potassium 8.4 (*)    BUN 30 (*)    Calcium, Ion 1.03 (*)    Hemoglobin 17.3 (*)    HCT 51.0 (*)  All other components within normal limits  PROTIME-INR  APTT  CBC  DIFFERENTIAL  HEMOGLOBIN A1C  I-STAT TROPONIN, ED  CBG MONITORING, ED  I-STAT CHEM 8, ED    EKG None  Radiology Ct Angio Head W Or Wo Contrast  Result Date: 08/11/2018 CLINICAL DATA:  Acute onset aphasia with resolution after 1 hour. Episode occurred greater than 6 hours ago. EXAM: CT ANGIOGRAPHY HEAD AND NECK TECHNIQUE: Multidetector CT imaging of the head and neck was performed using the standard protocol during bolus administration of intravenous contrast. Multiplanar CT image reconstructions and MIPs were obtained to evaluate the vascular anatomy. Carotid stenosis measurements (when applicable) are obtained utilizing NASCET criteria, using the distal internal carotid diameter as the denominator. CONTRAST:  ISOVUE-370 IOPAMIDOL (ISOVUE-370) INJECTION 76% COMPARISON:  Head CT 08/11/2018 FINDINGS: CTA NECK FINDINGS SKELETON: Lower cervical degenerative disc disease, worst at C5-6. OTHER NECK: Normal pharynx, larynx and major salivary glands. No cervical lymphadenopathy. Unremarkable thyroid gland. UPPER CHEST: No pneumothorax or pleural effusion. No nodules or masses. AORTIC ARCH: There is no calcific atherosclerosis of the aortic arch. There is no aneurysm, dissection or hemodynamically significant stenosis of the visualized ascending aorta and aortic arch. Conventional 3 vessel aortic branching pattern. The visualized proximal subclavian arteries  are widely patent. RIGHT CAROTID SYSTEM: --Common carotid artery: Widely patent origin without common carotid artery dissection or aneurysm. --Internal carotid artery: Normal without aneurysm, dissection or stenosis. --External carotid artery: No acute abnormality. LEFT CAROTID SYSTEM: --Common carotid artery: Widely patent origin without common carotid artery dissection or aneurysm. --Internal carotid artery: Normal without aneurysm, dissection or stenosis. --External carotid artery: No acute abnormality. VERTEBRAL ARTERIES: Codominant configuration. Both origins are normal. No dissection, occlusion or flow-limiting stenosis to the vertebrobasilar confluence. CTA HEAD FINDINGS POSTERIOR CIRCULATION: --Basilar artery: Normal. --Posterior cerebral arteries: Normal. Both originate from the basilar artery. --Superior cerebellar arteries: Normal. --Inferior cerebellar arteries: Normal anterior and posterior inferior cerebellar arteries. ANTERIOR CIRCULATION: --Intracranial internal carotid arteries: Normal. --Anterior cerebral arteries: Normal. Both A1 segments are present. Patent anterior communicating artery. --Middle cerebral arteries: Normal. --Posterior communicating arteries: Absent bilaterally. VENOUS SINUSES: As permitted by contrast timing, patent. ANATOMIC VARIANTS: None DELAYED PHASE: Not performed. Review of the MIP images confirms the above findings. IMPRESSION: Normal CTA of the head and neck. Electronically Signed   By: Deatra Robinson M.D.   On: 08/11/2018 14:33   Ct Head Wo Contrast  Result Date: 08/11/2018 CLINICAL DATA:  Slurred speech last night EXAM: CT HEAD WITHOUT CONTRAST TECHNIQUE: Contiguous axial images were obtained from the base of the skull through the vertex without intravenous contrast. COMPARISON:  CT brain scan of 06/27/2017 FINDINGS: Brain: The ventricular system is minimally prominent as are the cortical sulci consistent with mild atrophy. Mild small vessel ischemic change also is  noted throughout the periventricular white matter. The septum is midline in position. No hemorrhage, mass lesion, or acute infarction is seen. Benign-appearing bilateral basal ganglial calcifications are noted. Vascular: No vascular abnormality is seen on this unenhanced study. Skull: On bone window images, no calvarial abnormality is noted. Sinuses/Orbits: The paranasal sinuses are visualized with no evidence of sinusitis. Possible retention cysts are noted in the left maxillary sinus with retention cyst or polyp within the ethmoid air cells as well. Other: None. IMPRESSION: 1. Mild atrophy and mild small vessel ischemic change. No acute intracranial abnormality is seen. 2. Possible polyps within the ethmoid air cells with retention cysts in the left maxillary sinus. These findings are unchanged compared to  the CT from 2018. Electronically Signed   By: Dwyane Dee M.D.   On: 08/11/2018 11:29   Ct Angio Neck W And/or Wo Contrast  Result Date: 08/11/2018 CLINICAL DATA:  Acute onset aphasia with resolution after 1 hour. Episode occurred greater than 6 hours ago. EXAM: CT ANGIOGRAPHY HEAD AND NECK TECHNIQUE: Multidetector CT imaging of the head and neck was performed using the standard protocol during bolus administration of intravenous contrast. Multiplanar CT image reconstructions and MIPs were obtained to evaluate the vascular anatomy. Carotid stenosis measurements (when applicable) are obtained utilizing NASCET criteria, using the distal internal carotid diameter as the denominator. CONTRAST:  ISOVUE-370 IOPAMIDOL (ISOVUE-370) INJECTION 76% COMPARISON:  Head CT 08/11/2018 FINDINGS: CTA NECK FINDINGS SKELETON: Lower cervical degenerative disc disease, worst at C5-6. OTHER NECK: Normal pharynx, larynx and major salivary glands. No cervical lymphadenopathy. Unremarkable thyroid gland. UPPER CHEST: No pneumothorax or pleural effusion. No nodules or masses. AORTIC ARCH: There is no calcific atherosclerosis of  the aortic arch. There is no aneurysm, dissection or hemodynamically significant stenosis of the visualized ascending aorta and aortic arch. Conventional 3 vessel aortic branching pattern. The visualized proximal subclavian arteries are widely patent. RIGHT CAROTID SYSTEM: --Common carotid artery: Widely patent origin without common carotid artery dissection or aneurysm. --Internal carotid artery: Normal without aneurysm, dissection or stenosis. --External carotid artery: No acute abnormality. LEFT CAROTID SYSTEM: --Common carotid artery: Widely patent origin without common carotid artery dissection or aneurysm. --Internal carotid artery: Normal without aneurysm, dissection or stenosis. --External carotid artery: No acute abnormality. VERTEBRAL ARTERIES: Codominant configuration. Both origins are normal. No dissection, occlusion or flow-limiting stenosis to the vertebrobasilar confluence. CTA HEAD FINDINGS POSTERIOR CIRCULATION: --Basilar artery: Normal. --Posterior cerebral arteries: Normal. Both originate from the basilar artery. --Superior cerebellar arteries: Normal. --Inferior cerebellar arteries: Normal anterior and posterior inferior cerebellar arteries. ANTERIOR CIRCULATION: --Intracranial internal carotid arteries: Normal. --Anterior cerebral arteries: Normal. Both A1 segments are present. Patent anterior communicating artery. --Middle cerebral arteries: Normal. --Posterior communicating arteries: Absent bilaterally. VENOUS SINUSES: As permitted by contrast timing, patent. ANATOMIC VARIANTS: None DELAYED PHASE: Not performed. Review of the MIP images confirms the above findings. IMPRESSION: Normal CTA of the head and neck. Electronically Signed   By: Deatra Robinson M.D.   On: 08/11/2018 14:33   Mr Brain Wo Contrast  Result Date: 08/11/2018 CLINICAL DATA:  83 y/o F; transient speech abnormality. Persistent headache. EXAM: MRI HEAD WITHOUT CONTRAST TECHNIQUE: Multiplanar, multiecho pulse sequences of the  brain and surrounding structures were obtained without intravenous contrast. COMPARISON:  06/12/2014.  08/11/2018 CT head and CTA head. FINDINGS: Brain: No acute infarction, hemorrhage, hydrocephalus, extra-axial collection or mass lesion. Mild progression of punctate and early confluent nonspecific T2 FLAIR hyperintensities in subcortical and periventricular white matter are compatible with chronic microvascular ischemic changes for age and stable volume loss of the brain from 2015. Vascular: Normal flow voids. Skull and upper cervical spine: Normal marrow signal. Sinuses/Orbits: Mild mucosal thickening of the ethmoid air cells. No additional abnormal signal of paranasal sinuses or the mastoid air cells. Bilateral intra-ocular lens replacement. Other: None. IMPRESSION: 1. No acute intracranial abnormality identified. 2. Mild progression of chronic microvascular ischemic changes and stable volume loss of the brain from 2015. Electronically Signed   By: Mitzi Hansen M.D.   On: 08/11/2018 14:06   Ct Angio Chest Aorta W And/or Wo Contrast  Result Date: 08/11/2018 CLINICAL DATA:  Transient episode of inability to speak and memory difficulty yesterday. History of thoracic aortic aneurysm. EXAM:  CT ANGIOGRAPHY CHEST WITH CONTRAST TECHNIQUE: Multidetector CT imaging of the chest was performed using the standard protocol during bolus administration of intravenous contrast. Multiplanar CT image reconstructions and MIPs were obtained to evaluate the vascular anatomy. CONTRAST:  100 mL ISOVUE-370 IOPAMIDOL (ISOVUE-370) INJECTION 76% COMPARISON:  CT chest 08/18/2017 and 05/10/2017. FINDINGS: Cardiovascular: Ascending thoracic aortic aneurysm measuring approximately 4.4 cm in diameter is unchanged. No aortic dissection. Scattered aortic and coronary atherosclerotic vascular calcifications are noted. Heart size is mildly enlarged. No pericardial effusion. Mediastinum/Nodes: No enlarged mediastinal, hilar, or  axillary lymph nodes. Thyroid gland, trachea, and esophagus demonstrate no significant findings. Lungs/Pleura: Mild biapical and right middle lobe pleuroparenchymal scarring is unchanged. There is minimal dependent atelectasis. Lungs otherwise clear. Airways unremarkable. No pleural effusion. Upper Abdomen: 2 hepatic cysts are unchanged. Imaged upper abdomen is otherwise unremarkable. Musculoskeletal: Negative.  No acute or focal abnormality. Review of the MIP images confirms the above findings. IMPRESSION: No acute disease. Scattered calcific coronary artery disease. 4.4 cm ascending aortic aneurysm is unchanged, NOS (ICD10-I71.9). Aortic atherosclerosis (ICD10-I70.0) Electronically Signed   By: Drusilla Kannerhomas  Dalessio M.D.   On: 08/11/2018 14:12    Procedures Procedures (including critical care time)  Medications Ordered in ED Medications  acetaminophen (TYLENOL) tablet 1,000 mg (1,000 mg Oral Given 08/11/18 1157)  iopamidol (ISOVUE-370) 76 % injection (100 mLs  Contrast Given 08/11/18 1245)     Initial Impression / Assessment and Plan / ED Course  I have reviewed the triage vital signs and the nursing notes.  Pertinent labs & imaging results that were available during my care of the patient were reviewed by me and considered in my medical decision making (see chart for details).     TIA.  Patient prefers discharge if possible.  Discussed with neurology who saw the patient and recommends MRI CTA of head and neck.  Patient Artie has a CTA of her aorta scheduled for tomorrow and to reduce radiation exposure and dye exposure closure with 2 large loads of diet and 12 hours will go into her CT of her chest today.  Work-up unremarkable.  Suspect TIA.  Discussed with her primary neurology office and they will have an appointment for her on January 28.  Will need echocardiogram.  Neurologically intact and stable for discharge. Return if return of symptoms or changes.   Final Clinical Impressions(s) / ED  Diagnoses   Final diagnoses:  TIA (transient ischemic attack)    ED Discharge Orders         Ordered    atorvastatin (LIPITOR) 20 MG tablet  Daily after supper     08/11/18 1450    aspirin EC 325 MG tablet  Daily     08/11/18 1450           Phill Steck, Barbara CowerJason, MD 08/11/18 1622

## 2018-08-11 NOTE — Progress Notes (Signed)
Linda Lee was seen today in the movement disorders clinic for follow-up of Parkinson's disease that was diagnosed last visit.  She is on no medication, which has been her choice.  She reports today that she is having more tremor, esp in the R foot.  She is having "locking, like a cramp" in the fingers in the hands.  Pt denies falls.  Pt denies lightheadedness, near syncope.  No hallucinations.  Mood has been good.  She is doing [employer] cycle class 3 days per week.    08/12/18 update: Patient is seen in follow-up today from an emergency room visit yesterday.  I have reviewed records.  This patient is accompanied in the office by her spouse who supplements the history.Patient had an episode where she had difficulty expressing herself for about 20 minutes, but the episode actually occurred the day before (on Monday).  Pt states that she was stumbling over her words.  It was garbled speech.  No weakness or numbness.  No vision change.  She was at [employer].  She drove home from the restaurant.  Following that (when she got home), she was back to normal, although she did develop a headache over the left parietal region.  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 She still has a very dull ache.   MRI of the brain was obtained yesterday and I had the opportunity to review this.  There was no evidence of acute infarct.  There was a moderate amount of scattered T2 hyperintensities.  She also had a normal CTA of the head and neck performed.  She had a CT angiogram of the chest, which demonstrated her unchanged 4.4 cm ascending aortic aneurysm.  She was started on aspirin, 325 mg.  She was started on Lipitor 40 mg (states that she was supposed to be on a statin but "I had gotten down to one a week because I heard bad things about it.")  Today, she does feel back to normal.  On a separate note, last visit we started her on  carbidopa/levodopa 25/100 for her Parkinson's.  She is now on this 1 tablet 3 times per day.  She does think that this is beneficial.  She has had no falls.  No lightheadedness or near syncope.  PREVIOUS MEDICATIONS: none to date  ALLERGIES:   Allergies  Allergen Reactions  . Ciprofloxacin     Can not take due to hx of Cdiff per patient  . Wasp Venom Other (See Comments)    dizziness  . Fluconazole Rash    CURRENT MEDICATIONS:  Outpatient Encounter Medications as of 08/12/2018  Medication Sig  . aspirin EC 325 MG tablet Take 1 tablet (325 mg total) by mouth daily.  Marland Kitchen atorvastatin (LIPITOR) 20 MG tablet Take 1 tablet (20 mg total) by mouth daily after supper.  . carbidopa-levodopa (SINEMET IR) 25-100 MG tablet Take 1 tablet by mouth 3 (three) times daily.  Marland Kitchen CRANBERRY PO Take 300 mg daily by mouth.  . Cyanocobalamin (VITAMIN B-12 PO) Take 1,200 mcg daily by mouth.   . docusate sodium (COLACE) 100 MG capsule Take 100 mg by mouth daily as needed for mild constipation.   . Ergocalciferol (VITAMIN D2) 2000 units TABS Take 2,000 Units daily by mouth.   . estradiol (ESTRACE) 0.1 MG/GM vaginal cream Place 1 Applicatorful vaginally 2 (two) times a week.   . hydrocortisone (PROCTOZONE-HC) 2.5 %  rectal cream Place 1 application rectally as needed for hemorrhoids or anal itching.  . losartan (COZAAR) 50 MG tablet Take 50 mg by mouth daily.  Marland Kitchen. omeprazole (PRILOSEC) 40 MG capsule Take 40 mg by mouth daily.  Marland Kitchen. saccharomyces boulardii (FLORASTOR) 250 MG capsule Take 250 mg by mouth daily.   . [DISCONTINUED] losartan (COZAAR) 25 MG tablet Take 25 mg by mouth daily.  Marland Kitchen. EPINEPHrine 0.3 mg/0.3 mL IJ SOAJ injection Inject 0.3 mLs (0.3 mg total) into the muscle once as needed (anaphylaxis). (Patient not taking: Reported on 08/12/2018)   No facility-administered encounter medications on file as of 08/12/2018.     PAST MEDICAL HISTORY:   Past Medical History:  Diagnosis Date  . Anginal pain (HCC)   .  Angio-edema   . Arthritis   . GERD (gastroesophageal reflux disease)   . Hyperlipemia   . Migraines     PAST SURGICAL HISTORY:   Past Surgical History:  Procedure Laterality Date  . ADENOIDECTOMY    . BACK SURGERY    . HEMORRHOID SURGERY    . RIGHT/LEFT HEART CATH AND CORONARY ANGIOGRAPHY N/A 06/17/2017   Procedure: RIGHT/LEFT HEART CATH AND CORONARY ANGIOGRAPHY;  Surgeon: Yates DecampGanji, Jay, MD;  Location: [facility];  Service: Cardiovascular;  Laterality: N/A;  . 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 TONSILLECTOMY    . TOTAL KNEE ARTHROPLASTY Right 09/25/2015   Procedure: TOTAL KNEE ARTHROPLASTY;  Surgeon: Ollen GrossFrank Aluisio, MD;  Location: [facility];  Service: Orthopedics;  Laterality: Right;    SOCIAL HISTORY:   Social History   Socioeconomic History  . Marital status: Married    Spouse name: Not on file  . Number of children: Not on file  . Years of education: Not on file  . Highest education level: Not on file  Occupational History  . Occupation: retired    Comment: Runner, broadcasting/film/videoteacher  Social Needs  . Financial resource strain: Not on file  . Food insecurity:    Worry: Not on file    Inability: Not on file  . Transportation needs:    Medical: Not on file    Non-medical: Not on file  Tobacco Use  . Smoking status: Former Smoker    Types: Cigarettes    Last attempt to quit: 08/15/1963    Years since quitting: 55.0  . Smokeless tobacco: Never Used  Substance and Sexual Activity  . Alcohol use: Yes    Alcohol/week: 1.0 standard drinks    Types: 1 Glasses of wine per week    Comment: q hs  . Drug use: No  . Sexual activity: Yes  Lifestyle  . Physical activity:    Days per week: Not on file    Minutes per session: Not on file  . Stress: Not on file  Relationships  . Social connections:    Talks on phone: Not on file    Gets together: Not on file    Attends religious service: Not on file    Active member of club or organization: Not on file     Attends meetings of clubs or organizations: Not on file    Relationship status: Not on file  . Intimate partner violence:    Fear of current or ex partner: Not on file    Emotionally abused: Not on file    Physically abused: Not on file    Forced sexual activity: Not on file  Other Topics Concern  . Not on file  Social History Narrative  . Not on file    FAMILY HISTORY:   Family Status  Relation Name Status  . Mother  Deceased  . Father  Deceased  . Sister  Alive  . Child x3 Alive  . Neg Hx  (Not Specified)    ROS:  Review of Systems  Constitutional: Negative.   HENT: Negative.   Eyes: Negative.   Cardiovascular: Negative.   Gastrointestinal: Negative.   Genitourinary: Negative.   Skin: Negative.   Endo/Heme/Allergies: Negative.     PHYSICAL EXAMINATION:    VITALS:   Vitals:   08/12/18 0907  BP: (!) 142/72  Pulse: 62  SpO2: 97%  Weight: 156 lb (70.8 kg)  Height: 5' 4.5" (1.638 m)    GEN:  The patient appears stated age and is in NAD. HEENT:  Normocephalic, atraumatic.  The mucous membranes are moist. The superficial temporal arteries are without ropiness or tenderness. CV:  RRR Lungs:  CTAB Neck/HEME:  There are no carotid bruits bilaterally.  Neurological examination:  Orientation: The patient is alert and oriented x3. Cranial nerves: There is good facial symmetry. The speech is fluent and clear. Soft palate rises symmetrically and there is no tongue deviation. Hearing is intact to conversational tone. Sensation: Sensation is intact to light touch throughout.  No extinction with DSS.   Motor: Strength is 5/5 in the bilateral upper and lower extremities.   Shoulder shrug is equal and symmetric.  There is no pronator drift.   Movement examination: Tone: There is normal tone in the UE/LE Abnormal movements: There is occasional RUE rest tremor Coordination:  There is no decremation, with any form of RAMS, including alternating supination and pronation of  the forearm, hand opening and closing, finger taps, heel taps and toe taps. Gait and Station: The patient has no difficulty arising out of a deep-seated chair without the use of the hands. The patient's stride length is normal with good arm swing.      Chemistry      Component Value Date/Time   NA 142 08/11/2018 1108   K 3.9 08/11/2018 1108   CL 107 08/11/2018 1108   CO2 23 08/11/2018 1039   BUN 20 08/11/2018 1108   CREATININE 0.90 08/11/2018 1108      Component Value Date/Time   CALCIUM 9.0 08/11/2018 1039   ALKPHOS 89 08/11/2018 1039   AST 15 08/11/2018 1039   ALT <5 08/11/2018 1039   BILITOT 0.8 08/11/2018 1039      Lab Results  Component Value Date   CHOL 136 09/04/2012   HDL 67 09/04/2012   LDLCALC 63 09/04/2012   TRIG 30 09/04/2012   CHOLHDL 2.0 09/04/2012     ASSESSMENT/PLAN:  1.  Parkinsons disease  -doing better with carbidopa/levodopa 25/100, 1 po tid.  Much less rigid  -continue to f/u with dermatology.  We discussed that it used to be thought that levodopa would increase risk of melanoma but now it is believed that Parkinsons itself likely increases risk of melanoma. she is to get regular skin checks and she just saw dermatology  -continue to exercise.  Discussed role of exercise as it relates to PD  2. Expressive aphasia  -Differential diagnosis is between complicated migraine (patient did develop a headache following resolution of speech change) and TIA.  MRI of the brain was negative.  CTA of the head and neck has been negative.  She was started on aspirin at the hospital.    -We discussed the diagnosis as  well as pathophysiology of the disease.  We discussed signs and sx's of stroke and importance of calling 911 immediately should she have any of these.  Patient education was provided.    -Talked about stroke risk factors   -Talked about importance of blood pressure control with a goal <130/80 mm Hg.   -Talked about importance of lipid control and proper  diet.  Lipids should be managed intensively, with a goal LDL < 70 mg/dL.  Will check these today since fasting.  Was placed on lipitor, 40 mg, yesterday at hospittal  -I counseled the patient on measures to reduce stroke risk, including the importance of medication compliance, risk factor control, exercise, healthy diet, and avoidance of smoking.    -will order echo (Dr. Nadara Eaton to read)  -will order 48 hour holter monitor (Dr. Jacinto Halim)  3.  Follow up is anticipated in the next few months, sooner should new neurologic issues arise.  Much greater than 50% of this visit was spent in counseling and coordinating care.  Total face to face time:  30 min    Cc:  Rodrigo Ran, MD

## 2018-08-11 NOTE — ED Notes (Signed)
Lab adding on Hemoglobin A1c to previous blood draw.

## 2018-08-11 NOTE — Consult Note (Signed)
Neurology Consultation  Reason for Consult: Transient expressive aphasia Referring Physician: Mesner  CC: Transient expressive aphasia  History is obtained from: Patient  HPI: Linda Lee is a 83 y.o. female with history of migraines, hyperlipidemia, angioedema, anginal pain.  Patient comes to the hospital today after having an episode yesterday consisting of a 20-minute period of time at which she was unable to express herself with no other symptoms.  States that she did take aspirin but has not for some time.  She had no other symptoms at that time(weakness, tingling, unable to understand people, headache, blurred vision or loss of vision).  Patient states that her blood pressure is usually around 120-130 systolically.  Patient does have Parkinson's and is currently back to baseline.  She does see Dr. Arbutus Leasat and prefers not to stay in the hospital for full stroke work-up.  Of note patient did state that approximately an hour after the event she did have a headache but it was not throbbing.  Located over the left parietal region.   ED course: Will be obtaining CTA of head and neck along with MRI  LKW: 1330 on 08/10/2018 tpa given?: no, out of the window Premorbid modified Rankin scale (mRS): 0 NIH stroke scale 0  ROS: A 14 point ROS was performed and is negative except as noted in the HPI.   Past Medical History:  Diagnosis Date  . Anginal pain (HCC)   . Angio-edema   . Arthritis   . GERD (gastroesophageal reflux disease)   . Hyperlipemia   . Migraines      Family History  Problem Relation Age of Onset  . Prostate cancer Father   . Epilepsy Child   . Allergic rhinitis Neg Hx   . Angioedema Neg Hx   . Asthma Neg Hx   . Eczema Neg Hx   . Immunodeficiency Neg Hx   . Urticaria Neg Hx    Social History:   reports that she quit smoking about 55 years ago. Her smoking use included cigarettes. She has never used smokeless tobacco. She reports current alcohol use of about 1.0  standard drinks of alcohol per week. She reports that she does not use drugs.   Medications  Current Facility-Administered Medications:  .  iopamidol (ISOVUE-370) 76 % injection, , , ,   Current Outpatient Medications:  .  aspirin EC 81 MG tablet, Take 81 mg by mouth daily., Disp: , Rfl:  .  atorvastatin (LIPITOR) 20 MG tablet, Take 20 mg every Monday by mouth. , Disp: , Rfl:  .  CALCIUM PO, Take 1,000 mg by mouth daily., Disp: , Rfl:  .  carbidopa-levodopa (SINEMET IR) 25-100 MG tablet, Take 1 tablet by mouth 3 (three) times daily., Disp: 270 tablet, Rfl: 1 .  CRANBERRY PO, Take 300 mg daily by mouth., Disp: , Rfl:  .  Cyanocobalamin (VITAMIN B-12 PO), Take 1,200 mcg daily by mouth. , Disp: , Rfl:  .  docusate sodium (COLACE) 100 MG capsule, Take 100 mg daily by mouth., Disp: , Rfl:  .  EPINEPHrine 0.3 mg/0.3 mL IJ SOAJ injection, Inject 0.3 mLs (0.3 mg total) into the muscle once as needed (anaphylaxis). (Patient not taking: Reported on 12/10/2017), Disp: 2 Device, Rfl: 0 .  Ergocalciferol (VITAMIN D2) 2000 units TABS, Take 2,000 Units daily by mouth. , Disp: , Rfl:  .  estradiol (ESTRACE) 0.1 MG/GM vaginal cream, Place 1 Applicatorful vaginally once a week., Disp: , Rfl:  .  fluticasone furoate-vilanterol (BREO ELLIPTA) 100-25  MCG/INH AEPB, Inhale 1 puff into the lungs daily., Disp: , Rfl:  .  furosemide (LASIX) 20 MG tablet, Take 20 mg by mouth every morning. , Disp: , Rfl:  .  hydrocortisone (PROCTOZONE-HC) 2.5 % rectal cream, Place 1 application rectally as needed for hemorrhoids or anal itching., Disp: , Rfl:  .  omeprazole (PRILOSEC OTC) 20 MG tablet, Take 20 mg daily as needed by mouth (for acid reflux or heartburn). , Disp: , Rfl:  .  potassium chloride (K-DUR,KLOR-CON) 10 MEQ tablet, Take 10 mEq by mouth every morning. , Disp: , Rfl:  .  saccharomyces boulardii (FLORASTOR) 250 MG capsule, Take 250 mg 2 (two) times daily by mouth., Disp: , Rfl:    Exam: Current vital signs: BP (!)  145/85   Pulse 66   Temp 97.8 F (36.6 C) (Oral)   Resp 14   Ht 5' 4.5" (1.638 m)   Wt 70.3 kg   SpO2 99%   BMI 26.19 kg/m  Vital signs in last 24 hours: Temp:  [97.8 F (36.6 C)] 97.8 F (36.6 C) (01/07 1032) Pulse Rate:  [66-72] 66 (01/07 1230) Resp:  [14-16] 14 (01/07 1230) BP: (127-160)/(53-92) 145/85 (01/07 1230) SpO2:  [95 %-99 %] 99 % (01/07 1230) Weight:  [70.3 kg] 70.3 kg (01/07 1210)  Physical Exam  Constitutional: Appears well-developed and well-nourished.  Psych: Affect appropriate to situation Eyes: No scleral injection HENT: No OP obstrucion Head: Normocephalic.  Cardiovascular: Normal rate and regular rhythm.  Respiratory: Effort normal, non-labored breathing GI: Soft.  No distension. There is no tenderness.  Skin: WDI  Neuro: Mental Status: Patient is awake, alert, oriented to person, place, month, year, and situation. Patient is able to give a clear and coherent history. No signs of aphasia or neglect Cranial Nerves: II: Visual Fields are full.  III,IV, VI: EOMI without ptosis or diploplia.  Pupils are equal, round, and reactive to light.  V: Facial sensation is symmetric to temperature VII: Facial movement is symmetric.  Slight right nasolabial fold decrease VIII: hearing is intact to voice X: Uvula elevates symmetrically XI: Shoulder shrug is symmetric. XII: tongue is midline without atrophy or fasciculations.  Motor: Tone is normal. Bulk is normal. 5/5 strength was present in all four extremities.  Sensory: Sensation is symmetric to light touch and temperature in the arms and legs. Deep Tendon Reflexes: 2+ and symmetric in the biceps and patellae.  Plantars: Toes are downgoing bilaterally.  Cerebellar: FNF and HKS are intact bilaterally  Labs I have reviewed labs in epic and the results pertinent to this consultation are:   CBC    Component Value Date/Time   WBC 4.3 08/11/2018 1039   RBC 4.36 08/11/2018 1039   HGB 13.6 08/11/2018  1108   HCT 40.0 08/11/2018 1108   PLT 157 08/11/2018 1039   MCV 95.4 08/11/2018 1039   MCH 31.0 08/11/2018 1039   MCHC 32.5 08/11/2018 1039   RDW 13.1 08/11/2018 1039   LYMPHSABS 1.1 08/11/2018 1039   MONOABS 0.3 08/11/2018 1039   EOSABS 0.1 08/11/2018 1039   BASOSABS 0.0 08/11/2018 1039    CMP     Component Value Date/Time   NA 142 08/11/2018 1108   K 3.9 08/11/2018 1108   CL 107 08/11/2018 1108   CO2 23 08/11/2018 1039   GLUCOSE 86 08/11/2018 1108   BUN 20 08/11/2018 1108   CREATININE 0.90 08/11/2018 1108   CALCIUM 9.0 08/11/2018 1039   PROT 6.9 08/11/2018 1039   ALBUMIN 3.9  08/11/2018 1039   AST 15 08/11/2018 1039   ALT <5 08/11/2018 1039   ALKPHOS 89 08/11/2018 1039   BILITOT 0.8 08/11/2018 1039   GFRNONAA 60 (L) 08/11/2018 1039   GFRAA >60 08/11/2018 1039    Lipid Panel     Component Value Date/Time   CHOL 136 09/04/2012 0550   TRIG 30 09/04/2012 0550   HDL 67 09/04/2012 0550   CHOLHDL 2.0 09/04/2012 0550   VLDL 6 09/04/2012 0550   LDLCALC 63 09/04/2012 0550     Imaging I have reviewed the images obtained:  CT-scan of the brain-mild atrophy and small vessel ischemic changes.  No acute intracranial abnormality seen.  MRI examination of the brain-to be obtained  CTA of head and neck-to be obtained  Felicie Mornavid Desirea Mizrahi PA-C [facility] [phone]  M-F  (9:00 am- 5:00 PM)  08/11/2018, 12:48 PM     Assessment:  83 year old female presenting to the hospital with what I believe would be a TIA of the left parietal region however she does have migraines and cannot exclude this.  Patient does see Dr. Arbutus Leasat as an outpatient for Parkinson's and does not wish to stay in the hospital.  At this point will obtain CTA of head and neck, MRI brain.   Recommendations:  -CTA of head and neck -MRI brain -Patient may obtain her echocardiogram and HbA1c/LDL as an outpatient with Dr. Arbutus Leasat

## 2018-08-11 NOTE — ED Notes (Signed)
Patient transported to CT 

## 2018-08-12 ENCOUNTER — Ambulatory Visit: Payer: Medicare Other | Admitting: Neurology

## 2018-08-12 ENCOUNTER — Other Ambulatory Visit (INDEPENDENT_AMBULATORY_CARE_PROVIDER_SITE_OTHER): Payer: Medicare Other

## 2018-08-12 ENCOUNTER — Other Ambulatory Visit: Payer: Medicare Other

## 2018-08-12 ENCOUNTER — Encounter

## 2018-08-12 ENCOUNTER — Encounter: Payer: Self-pay | Admitting: Neurology

## 2018-08-12 VITALS — BP 142/72 | HR 62 | Ht 64.5 in | Wt 156.0 lb

## 2018-08-12 DIAGNOSIS — G20 Parkinson's disease: Secondary | ICD-10-CM

## 2018-08-12 DIAGNOSIS — G459 Transient cerebral ischemic attack, unspecified: Secondary | ICD-10-CM | POA: Diagnosis not present

## 2018-08-12 LAB — LIPID PANEL
Cholesterol: 178 mg/dL (ref <200)
HDL: 56 mg/dL (ref >50)
LDL Cholesterol (Calc): 108 mg/dL (calc) — ABNORMAL HIGH
Non-HDL Cholesterol (Calc): 122 mg/dL (calc) (ref <130)
Total CHOL/HDL Ratio: 3.2 (calc) (ref <5.0)
Triglycerides: 58 mg/dL (ref <150)

## 2018-08-12 NOTE — Patient Instructions (Signed)
1. Dr. Verl Dicker office will contact you directly to schedule your Holter Monitor and Echocardiogram.   2. Your provider has requested that you have labwork completed today. Please go to [facility] ([id number]) on the second floor of this building before leaving the office today. You do not need to check in. If you are not called within 15 minutes please check with the front desk.

## 2018-08-12 NOTE — Addendum Note (Signed)
Addended bySilvio Pate on: 08/12/2018 11:38 AM   Modules accepted: Orders

## 2018-08-13 ENCOUNTER — Ambulatory Visit (INDEPENDENT_AMBULATORY_CARE_PROVIDER_SITE_OTHER): Payer: Medicare Other

## 2018-08-13 ENCOUNTER — Telehealth: Payer: Self-pay | Admitting: Neurology

## 2018-08-13 DIAGNOSIS — T63441D Toxic effect of venom of bees, accidental (unintentional), subsequent encounter: Secondary | ICD-10-CM

## 2018-08-13 NOTE — Telephone Encounter (Signed)
Let pt know that LDL was 108.  As we discussed yesterday, goal is less than 70.  She was only taking her statin once per week.   Needs to start on it as RX by hospital (lipitor 40 mg daily)

## 2018-08-13 NOTE — Telephone Encounter (Signed)
Patient made aware of results and recommendation.

## 2018-08-17 LAB — I-STAT CHEM 8, ED
BUN: 30 mg/dL — ABNORMAL HIGH (ref 8–23)
Calcium, Ion: 1.03 mmol/L — ABNORMAL LOW (ref 1.15–1.40)
Chloride: 101 mmol/L (ref 98–111)
Creatinine, Ser: 0.9 mg/dL (ref 0.44–1.00)
Glucose, Bld: 90 mg/dL (ref 70–99)
HCT: 51 % — ABNORMAL HIGH (ref 36.0–46.0)
Hemoglobin: 17.3 g/dL — ABNORMAL HIGH (ref 12.0–15.0)
Potassium: 8.4 mmol/L (ref 3.5–5.1)
Sodium: 131 mmol/L — ABNORMAL LOW (ref 135–145)
TCO2: 28 mmol/L (ref 22–32)

## 2018-08-27 ENCOUNTER — Encounter: Payer: Self-pay | Admitting: Neurology

## 2018-09-01 ENCOUNTER — Ambulatory Visit: Payer: Medicare Other | Admitting: Neurology

## 2018-09-07 ENCOUNTER — Other Ambulatory Visit: Payer: Self-pay | Admitting: Cardiology

## 2018-09-10 ENCOUNTER — Ambulatory Visit (INDEPENDENT_AMBULATORY_CARE_PROVIDER_SITE_OTHER): Payer: Medicare Other

## 2018-09-10 DIAGNOSIS — T63441D Toxic effect of venom of bees, accidental (unintentional), subsequent encounter: Secondary | ICD-10-CM | POA: Diagnosis not present

## 2018-09-24 DIAGNOSIS — G459 Transient cerebral ischemic attack, unspecified: Secondary | ICD-10-CM | POA: Diagnosis not present

## 2018-10-05 ENCOUNTER — Ambulatory Visit: Payer: Medicare Other | Admitting: Cardiology

## 2018-10-05 ENCOUNTER — Encounter: Payer: Self-pay | Admitting: Cardiology

## 2018-10-05 ENCOUNTER — Other Ambulatory Visit: Payer: Self-pay | Admitting: Cardiology

## 2018-10-05 VITALS — BP 126/76 | HR 75 | Ht 65.0 in | Wt 159.5 lb

## 2018-10-05 DIAGNOSIS — I712 Thoracic aortic aneurysm, without rupture: Secondary | ICD-10-CM

## 2018-10-05 DIAGNOSIS — I7121 Aneurysm of the ascending aorta, without rupture: Secondary | ICD-10-CM

## 2018-10-05 DIAGNOSIS — I4729 Other ventricular tachycardia: Secondary | ICD-10-CM

## 2018-10-05 DIAGNOSIS — G43809 Other migraine, not intractable, without status migrainosus: Secondary | ICD-10-CM

## 2018-10-05 DIAGNOSIS — Z8673 Personal history of transient ischemic attack (TIA), and cerebral infarction without residual deficits: Secondary | ICD-10-CM | POA: Insufficient documentation

## 2018-10-05 DIAGNOSIS — I472 Ventricular tachycardia: Secondary | ICD-10-CM

## 2018-10-05 DIAGNOSIS — I2510 Atherosclerotic heart disease of native coronary artery without angina pectoris: Secondary | ICD-10-CM

## 2018-10-05 DIAGNOSIS — R0609 Other forms of dyspnea: Secondary | ICD-10-CM

## 2018-10-05 MED ORDER — PROPRANOLOL HCL ER 80 MG PO CP24: 80.0000 mg | ORAL_CAPSULE | Freq: Every day | ORAL | 1 refills | Status: DC

## 2018-10-05 NOTE — Progress Notes (Signed)
Subjective:   Linda Lee, female    DOB: 08/24/1935, 83 y.o.   MRN: [id number]  Linda Ran, MD:  Chief Complaint  Patient presents with  . Results    event monitor  . Follow-up    HPI: Linda Lee  is a 83 y.o. female  with Parkinson's disease, hyperlipidemia, 4.5 cm ascending thoracicaortic aneurysm, complex migraines, and coronary calcification on CT scan.  Patient was seen approximately 8 weeks ago after experiencing questionable TIA on 08/10/2018 when she had difficulty expressing herself for approximately 20 minutes.  She was evaluated in the outpatient setting by Dr. Lurena Joiner Tat, who recommended daily statins, echocardiogram, and event monitor.  Differential diagnosis also includes complex migraines in view of her history of this. She is now tolerating Lipitor 20 mg daily.  She is here to discuss test results.  No reoccurrence of symptoms. Has chronic dyspnea on exertion has been stable.  She also has history of Parkinson's that has been stable.     Past Medical History:  Diagnosis Date  . Anginal pain (HCC)   . Angio-edema   . Arthritis   . GERD (gastroesophageal reflux disease)   . Hyperlipemia   . Migraines     Past Surgical History:  Procedure Laterality Date  . ADENOIDECTOMY    . BACK SURGERY    . HEMORRHOID SURGERY    . RIGHT/LEFT HEART CATH AND CORONARY ANGIOGRAPHY N/A 06/17/2017   Procedure: RIGHT/LEFT HEART CATH AND CORONARY ANGIOGRAPHY;  Surgeon: Yates Decamp, MD;  Location: [facility];  Service: Cardiovascular;  Laterality: N/A;  . 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     denies surgery for this  . TONSILLECTOMY    . TOTAL KNEE ARTHROPLASTY Right 09/25/2015   Procedure: TOTAL KNEE ARTHROPLASTY;  Surgeon: Ollen Gross, MD;  Location: [facility];  Service: Orthopedics;  Laterality: Right;    Family History  Problem Relation Age of Onset  . Prostate cancer Father   . Epilepsy Child   . Allergic rhinitis Neg Hx   . Angioedema Neg  Hx   . Asthma Neg Hx   . Eczema Neg Hx   . Immunodeficiency Neg Hx   . Urticaria Neg Hx     Social History   Socioeconomic History  . Marital status: Married    Spouse name: Not on file  . Number of children: 3  . Years of education: Not on file  . Highest education level: Not on file  Occupational History  . Occupation: retired    Comment: Runner, broadcasting/film/video  Social Needs  . Financial resource strain: Not on file  . Food insecurity:    Worry: Not on file    Inability: Not on file  . Transportation needs:    Medical: Not on file    Non-medical: Not on file  Tobacco Use  . Smoking status: Former Smoker    Types: Cigarettes    Last attempt to quit: 08/15/1963    Years since quitting: 55.1  . Smokeless tobacco: Never Used  Substance and Sexual Activity  . Alcohol use: Yes    Alcohol/week: 1.0 standard drinks    Types: 1 Glasses of wine per week    Comment: q hs  . Drug use: No  . Sexual activity: Yes  Lifestyle  . Physical activity:    Days per week: Not on file    Minutes per session: Not on file  . Stress: Not on file  Relationships  . Social connections:  Talks on phone: Not on file    Gets together: Not on file    Attends religious service: Not on file    Active member of club or organization: Not on file    Attends meetings of clubs or organizations: Not on file    Relationship status: Not on file  . Intimate partner violence:    Fear of current or ex partner: Not on file    Emotionally abused: Not on file    Physically abused: Not on file    Forced sexual activity: Not on file  Other Topics Concern  . Not on file  Social History Narrative  . Not on file    Current Meds  Medication Sig  . aspirin EC 325 MG tablet Take 1 tablet (325 mg total) by mouth daily.  Marland Kitchen atorvastatin (LIPITOR) 20 MG tablet Take 1 tablet (20 mg total) by mouth daily after supper.  . carbidopa-levodopa (SINEMET IR) 25-100 MG tablet Take 1 tablet by mouth 3 (three) times daily.  Marland Kitchen  CRANBERRY PO Take 300 mg daily by mouth.  . Cyanocobalamin (VITAMIN B-12 PO) Take 1,200 mcg daily by mouth.   . docusate sodium (COLACE) 100 MG capsule Take 100 mg by mouth daily as needed for mild constipation.   Marland Kitchen EPINEPHrine 0.3 mg/0.3 mL IJ SOAJ injection Inject 0.3 mLs (0.3 mg total) into the muscle once as needed (anaphylaxis).  . Ergocalciferol (VITAMIN D2) 2000 units TABS Take 2,000 Units daily by mouth.   . estradiol (ESTRACE) 0.1 MG/GM vaginal cream Place 1 Applicatorful vaginally 2 (two) times a week.   . losartan (COZAAR) 50 MG tablet TAKE 1 TABLET BY MOUTH AT BEDTIME  . omeprazole (PRILOSEC) 40 MG capsule Take 40 mg by mouth continuous as needed.   . saccharomyces boulardii (FLORASTOR) 250 MG capsule Take 250 mg by mouth daily.      Review of Systems  Constitution: Negative for decreased appetite, malaise/fatigue, weight gain and weight loss.  Eyes: Negative for visual disturbance.  Cardiovascular: Positive for dyspnea on exertion. Negative for chest pain, claudication, leg swelling, orthopnea, palpitations and syncope.  Respiratory: Negative for hemoptysis and wheezing.   Endocrine: Negative for cold intolerance and heat intolerance.  Hematologic/Lymphatic: Does not bruise/bleed easily.  Skin: Negative for nail changes.  Musculoskeletal: Positive for muscle weakness (parkinsons). Negative for myalgias.  Gastrointestinal: Negative for abdominal pain, change in bowel habit, nausea and vomiting.  Neurological: Negative for difficulty with concentration, dizziness, focal weakness and headaches.  Psychiatric/Behavioral: Negative for altered mental status and suicidal ideas.  All other systems reviewed and are negative.      Objective:     Blood pressure 126/76, pulse 75, height  (1.651 m), weight 159 lb 8 oz (72.3 kg), SpO2 97 %.  30 day event monitor 01/22-02/20/2020: Dominant rhythm is normal sinus. 3 patient reported events without any symptoms correlated with sinus  rhythm. 2 auto detected events of NSVT on 01/30 for 4 beats and 02/14 for 6 beats that were both asymptomatic. Lowest HR was 43 during sleep and fastest HR was 125 bpm. No A fib was noted.   Double contrast echocardiogram 08/26/2018: Normal LV systolic function, no intracardiac shunting noted. Limited study.  Right and left heart cath 06/17/2017: Normal right and left heart cath. RA 8/5/4, 71%, RV 28//3/7; PA 29/6/18, sat 72%; PW 13/10/8, Ao sat 99%. CO 4.8, CI 2.73. Qp:Qs 1.04.  CT angiogram chest 08/11/2018: No change from CT scan done previously on 08/18/2017: Comparison 05/10/2017: Aortic atherosclerosis,  ascending aorta measures 4.5 cm, stable. Liver cysts.  08/10/2018: MRI brain and CTA neck normal  Physical Exam  Constitutional: She is oriented to person, place, and time. Vital signs are normal. She appears well-developed and well-nourished.  HENT:  Head: Normocephalic and atraumatic.  Neck: Normal range of motion.  Cardiovascular: Normal rate, regular rhythm, normal heart sounds and intact distal pulses.  Pulses:      Femoral pulses are 2+ on the right side and 2+ on the left side.      Popliteal pulses are 2+ on the right side and 2+ on the left side.       Dorsalis pedis pulses are 1+ on the right side and 2+ on the left side.       Posterior tibial pulses are 1+ on the right side and 1+ on the left side.  Pulmonary/Chest: Effort normal and breath sounds normal. No accessory muscle usage. No respiratory distress.  Abdominal: Soft. Bowel sounds are normal.  Musculoskeletal: Normal range of motion.  Neurological: She is alert and oriented to person, place, and time.  Skin: Skin is warm and dry.  Vitals reviewed.        Assessment & Recommendations:   1. Other migraine without status migrainosus, not intractable Suspect etiology for her symptoms, as cardiac workup has been unyielding. Follows Dr. Arbutus Leas  2. History of TIA Has not had any reoccurrence of symptoms. Etiology for  possible TIA is still unknown. She did not have any episodes of atrial fibrillation on event monitor. No PFO was noted by echo. Suspect her symptoms may have been related to complex migraines. Will continue to monitor.   3. Nonsustained ventricular tachycardia (HCC) Noted to have 2 brief episodes on event monitor. Has had normal coronary arteries on cath in 2018. Suspect benign etiology. Mechanism was discussed. Will discontinue her Losartan and change to Propanolol 80 mg daily. Side effects were discussed.  4. Dyspnea on exertion Minimal dyspnea on exertion. Now exercising 3 days a week at a class for Parkinson's patients.  5. Coronary artery calcification seen on CT scan Noted on recent CT scan, but has had normal coronary angiogram in 2018. Continue with medical management.  6. Ascending aortic aneurysm (HCC) Has been stable. She is scheduled for CTA of chest in 1 year and we will continue to follow. Echocardiogram has not correlated in the past.  I will see her back in 1 year after CTA of chest for follow up on aortic aneurysm, but encouraged sooner follow up for any new or worsening problems.   *I have discussed this case with Dr. Jacinto Halim and he personally examined the patient and participated in formulating the plan.*       Altamese , FNP-C [facility] Office: [phone] Fax: [phone]

## 2018-10-06 ENCOUNTER — Encounter: Payer: Self-pay | Admitting: Cardiology

## 2018-10-06 DIAGNOSIS — I4729 Other ventricular tachycardia: Secondary | ICD-10-CM | POA: Insufficient documentation

## 2018-10-06 DIAGNOSIS — G43909 Migraine, unspecified, not intractable, without status migrainosus: Secondary | ICD-10-CM | POA: Insufficient documentation

## 2018-10-06 DIAGNOSIS — I472 Ventricular tachycardia: Secondary | ICD-10-CM | POA: Insufficient documentation

## 2018-10-08 ENCOUNTER — Ambulatory Visit: Payer: Self-pay

## 2018-10-09 ENCOUNTER — Ambulatory Visit (INDEPENDENT_AMBULATORY_CARE_PROVIDER_SITE_OTHER): Payer: Medicare Other | Admitting: *Deleted

## 2018-10-09 DIAGNOSIS — T63442D Toxic effect of venom of bees, intentional self-harm, subsequent encounter: Secondary | ICD-10-CM | POA: Diagnosis not present

## 2018-10-12 ENCOUNTER — Encounter: Payer: Self-pay | Admitting: Cardiology

## 2018-10-20 ENCOUNTER — Telehealth: Payer: Self-pay | Admitting: Neurology

## 2018-10-20 NOTE — Telephone Encounter (Signed)
Received records from Dr. Verl Dicker office.  Patient had an event monitor from January 22 to September 24, 2018.  Normal sinus rhythm.  2 detections of NSVT that were asymptomatic.  No atrial fibrillation.  Echocardiogram was done on August 27, 2018.  Left ventricular ejection fraction was 55 to 60%.  Bubble study/saline contrast was done and did not show any evidence of intra-arterial shunt.  Jade, let the patient know that echocardiogram and event monitor looked good.

## 2018-10-21 NOTE — Telephone Encounter (Signed)
Patient made aware of results.  

## 2018-11-02 ENCOUNTER — Ambulatory Visit: Payer: Medicare Other | Admitting: Neurology

## 2018-11-03 ENCOUNTER — Other Ambulatory Visit: Payer: Self-pay | Admitting: Cardiothoracic Surgery

## 2018-11-03 DIAGNOSIS — I7121 Aneurysm of the ascending aorta, without rupture: Secondary | ICD-10-CM

## 2018-11-03 DIAGNOSIS — I712 Thoracic aortic aneurysm, without rupture, unspecified: Secondary | ICD-10-CM

## 2018-11-05 ENCOUNTER — Other Ambulatory Visit: Payer: Self-pay

## 2018-11-05 ENCOUNTER — Ambulatory Visit (INDEPENDENT_AMBULATORY_CARE_PROVIDER_SITE_OTHER): Payer: Medicare Other | Admitting: *Deleted

## 2018-11-05 DIAGNOSIS — T63441D Toxic effect of venom of bees, accidental (unintentional), subsequent encounter: Secondary | ICD-10-CM

## 2018-11-06 ENCOUNTER — Ambulatory Visit: Payer: Self-pay

## 2018-11-25 ENCOUNTER — Other Ambulatory Visit: Payer: Self-pay | Admitting: Cardiology

## 2018-12-03 ENCOUNTER — Ambulatory Visit (INDEPENDENT_AMBULATORY_CARE_PROVIDER_SITE_OTHER): Payer: Medicare Other

## 2018-12-03 ENCOUNTER — Other Ambulatory Visit: Payer: Self-pay | Admitting: Neurology

## 2018-12-03 ENCOUNTER — Other Ambulatory Visit: Payer: Self-pay

## 2018-12-03 ENCOUNTER — Telehealth: Payer: Self-pay | Admitting: Neurology

## 2018-12-03 DIAGNOSIS — T63441D Toxic effect of venom of bees, accidental (unintentional), subsequent encounter: Secondary | ICD-10-CM

## 2018-12-03 NOTE — Telephone Encounter (Signed)
Yes please

## 2018-12-03 NOTE — Telephone Encounter (Signed)
Got message to refill pts med.  Call patient and tell her I will refill but needs to make evisit.  I see you have left messages before regarding this.  thanks

## 2018-12-03 NOTE — Telephone Encounter (Signed)
She has a appointment on 03-26-19 do you want to see her sooner?

## 2018-12-03 NOTE — Telephone Encounter (Signed)
Patient is sch for 12-07-18 for a E-Visit

## 2018-12-04 ENCOUNTER — Encounter: Payer: Self-pay | Admitting: *Deleted

## 2018-12-04 NOTE — Progress Notes (Signed)
Virtual Visit via Video Note The purpose of this virtual visit is to provide medical care while limiting exposure to the novel coronavirus.    Consent was obtained for video visit:  Yes.   Answered questions that patient had about telehealth interaction:  Yes.   I discussed the limitations, risks, security and privacy concerns of performing an evaluation and management service by telemedicine. I also discussed with the patient that there may be a patient responsible charge related to this service. The patient expressed understanding and agreed to proceed.  Pt location: Home Physician Location: office Name of referring provider:  Rodrigo RanPerini, Mark, MD I connected with Rennie PlowmanHelen M Wierman at patients initiation/request on 12/07/2018 at  9:30 AM EDT by video enabled telemedicine application and verified that I am speaking with the correct person using two identifiers. Pt MRN:  [id number] Pt DOB:  01-04-1936 Video Participants:  Rennie PlowmanHelen M Armenti;     History of Present Illness:  Patient is seen today in follow-up for Parkinson's and her history of TIA.  For her Parkinson's, she is on carbidopa/levodopa 25/100, 1 tablet 3 times per day.  She is doing well on the medication.  No falls.  No lightheadedness or near syncope.  She misses her exercise classes.  She notes tremor at bedtime but that has not particularly been bothersome to her and is not preventing her from going to sleep.  For her history of TIA, she is on aspirin and Lipitor.  Last LDL was greater than 100, but the patient had only been taking her statin once per week.  She is now on it regularly.  Have reviewed records from cardiology since last visit.  She had an event monitor from Dr. Verl DickerGanji's office from January 22 to February 20.  There was normal sinus rhythm.  There was 2 detections of NSVT that were asymptomatic.  There was no atrial fibrillation.  Echocardiogram was done on August 27, 2018.  Left ventricular ejection fraction was 55% to  60%.  she has been seeing Dr. Merlyn LotKuzma after she cut her hand on a chain saw cutting up limbs.  She knows that she is not to do that any more.  The records that were made available to me were reviewed regarding that   Observations/Objective:   Vitals:   12/07/18 0912  BP: 131/77  Pulse: (!) 49  Weight: 155 lb (70.3 kg)  Height: 5' 4.5" (1.638 m)   GEN:  The patient appears stated age and is in NAD.  Neurological examination:  Orientation: The patient is alert and oriented x3. Cranial nerves: There is good facial symmetry. There is no facial hypomimia.  The speech is fluent and clear. Soft palate rises symmetrically and there is no tongue deviation. Hearing is intact to conversational tone. Motor: Strength is at least antigravity x 4.   Shoulder shrug is equal and symmetric.  There is no pronator drift.  Movement examination: Tone: unable Abnormal movements: Was not able to see any rest tremor today, but did not see the right hand much at the video visit as she was holding the phone Coordination:  There is mild decremation with RAM's, with finger taps on the right.  She was unable to do hand opening and closing on the right because of her recent hand injury with the chainsaw. Gait and Station: The patient walks well in her home with good arm swing  Lab Results  Component Value Date   CHOL 178 08/12/2018   CHOL 136 09/04/2012  CHOL  06/09/2009    129        ATP III CLASSIFICATION:  <200     mg/dL   Desirable  409-811  mg/dL   Borderline High  >=914    mg/dL   High          Lab Results  Component Value Date   HDL 56 08/12/2018   HDL 67 09/04/2012   HDL 49 06/09/2009   Lab Results  Component Value Date   LDLCALC 108 (H) 08/12/2018   LDLCALC 63 09/04/2012   LDLCALC  06/09/2009    74        Total Cholesterol/HDL:CHD Risk Coronary Heart Disease Risk Table                     Men   Women  1/2 Average Risk   3.4   3.3  Average Risk       5.0   4.4  2 X Average Risk   9.6    7.1  3 X Average Risk  23.4   11.0        Use the calculated Patient Ratio above and the CHD Risk Table to determine the patient's CHD Risk.        ATP III CLASSIFICATION (LDL):  <100     mg/dL   Optimal  782-956  mg/dL   Near or Above                    Optimal  130-159  mg/dL   Borderline  213-086  mg/dL   High  >578     mg/dL   Very High   Lab Results  Component Value Date   TRIG 58 08/12/2018   TRIG 30 09/04/2012   TRIG 30 06/09/2009   Lab Results  Component Value Date   CHOLHDL 3.2 08/12/2018   CHOLHDL 2.0 09/04/2012   CHOLHDL 2.6 06/09/2009   No results found for: LDLDIRECT   Assessment and Plan:   1.  Parkinsons disease             -doing better with carbidopa/levodopa 25/100, 1 po tid.   she has some tremor towards bedtime, but states that it is not bothersome and we decided not to change her medication today.             -continue to f/u with dermatology.  We discussed that it used to be thought that levodopa would increase risk of melanoma but now it is believed that Parkinsons itself likely increases risk of melanoma. she is to get regular skin checks and she just saw dermatology             -continue to exercise.  Discussed role of exercise as it relates to PD.  Sent exercises through the portal that she could do while they are not able to get out during the pandemic.  2. Expressive aphasia             -Differential diagnosis is between complicated migraine (patient did develop a headache following resolution of speech change) and TIA.  MRI of the brain was negative.  CTA of the head and neck has been negative.  She was started on aspirin at the hospital.               -We discussed the diagnosis as well as pathophysiology of the disease.  We discussed signs and sx's of stroke and importance of calling 911 immediately should she have any  of these.  Patient education was provided.               -Talked about stroke risk factors              -Talked about importance  of blood pressure control with a goal <130/80 mm Hg.              -Talked about importance of lipid control and proper diet.  Lipids should be managed intensively, with a goal LDL < 70 mg/dL.  Last LDL was 108, but she had only been taking her Lipitor once per week and is now on it regularly.  Decided not to recheck this today because this was a telemedicine visit.  States that she has a physical at the end of the summer and her lipids will be checked with her primary care physician then.  We will get a copy of that after it has been done.  Will continue on Lipitor, 40 mg daily.             -I counseled the patient on measures to reduce stroke risk, including the importance of medication compliance, risk factor control, exercise, healthy diet, and avoidance of smoking.               -Echocardiogram and event monitor were negative.  Follow Up Instructions:  She has a follow-up in August.  She wants to keep that, since she will be in the office with her husband anyway.  -I discussed the assessment and treatment plan with the patient. The patient was provided an opportunity to ask questions and all were answered. The patient agreed with the plan and demonstrated an understanding of the instructions.   The patient was advised to call back or seek an in-person evaluation if the symptoms worsen or if the condition fails to improve as anticipated.    Total Time spent in visit with the patient was:  15 min, of which more than 50% of the time was spent in counseling and/or coordinating care on safety and exercise.   Pt understands and agrees with the plan of care outlined.     Kerin Salen, DO

## 2018-12-07 ENCOUNTER — Telehealth (INDEPENDENT_AMBULATORY_CARE_PROVIDER_SITE_OTHER): Payer: Medicare Other | Admitting: Neurology

## 2018-12-07 ENCOUNTER — Other Ambulatory Visit: Payer: Self-pay

## 2018-12-07 ENCOUNTER — Encounter: Payer: Self-pay | Admitting: Neurology

## 2018-12-07 DIAGNOSIS — G20 Parkinson's disease: Secondary | ICD-10-CM

## 2018-12-07 DIAGNOSIS — Z8673 Personal history of transient ischemic attack (TIA), and cerebral infarction without residual deficits: Secondary | ICD-10-CM | POA: Diagnosis not present

## 2018-12-31 ENCOUNTER — Ambulatory Visit (INDEPENDENT_AMBULATORY_CARE_PROVIDER_SITE_OTHER): Payer: Medicare Other

## 2018-12-31 ENCOUNTER — Other Ambulatory Visit: Payer: Self-pay

## 2018-12-31 DIAGNOSIS — T63441D Toxic effect of venom of bees, accidental (unintentional), subsequent encounter: Secondary | ICD-10-CM

## 2019-01-13 ENCOUNTER — Other Ambulatory Visit: Payer: Medicare Other

## 2019-01-13 ENCOUNTER — Ambulatory Visit: Payer: Medicare Other | Admitting: Cardiothoracic Surgery

## 2019-01-15 ENCOUNTER — Other Ambulatory Visit: Payer: Self-pay | Admitting: Cardiology

## 2019-01-29 ENCOUNTER — Ambulatory Visit: Payer: Self-pay

## 2019-01-29 ENCOUNTER — Other Ambulatory Visit: Payer: Self-pay

## 2019-01-29 ENCOUNTER — Ambulatory Visit (INDEPENDENT_AMBULATORY_CARE_PROVIDER_SITE_OTHER): Payer: Medicare Other

## 2019-01-29 DIAGNOSIS — T63441D Toxic effect of venom of bees, accidental (unintentional), subsequent encounter: Secondary | ICD-10-CM | POA: Diagnosis not present

## 2019-02-15 ENCOUNTER — Ambulatory Visit (INDEPENDENT_AMBULATORY_CARE_PROVIDER_SITE_OTHER): Payer: Medicare Other

## 2019-02-15 ENCOUNTER — Encounter: Payer: Self-pay | Admitting: Podiatry

## 2019-02-15 ENCOUNTER — Other Ambulatory Visit: Payer: Self-pay

## 2019-02-15 ENCOUNTER — Ambulatory Visit (INDEPENDENT_AMBULATORY_CARE_PROVIDER_SITE_OTHER): Payer: Medicare Other | Admitting: Podiatry

## 2019-02-15 VITALS — Temp 98.2°F

## 2019-02-15 DIAGNOSIS — L600 Ingrowing nail: Secondary | ICD-10-CM | POA: Diagnosis not present

## 2019-02-15 DIAGNOSIS — B351 Tinea unguium: Secondary | ICD-10-CM | POA: Diagnosis not present

## 2019-02-15 DIAGNOSIS — M79675 Pain in left toe(s): Secondary | ICD-10-CM

## 2019-02-15 DIAGNOSIS — M79674 Pain in right toe(s): Secondary | ICD-10-CM

## 2019-02-15 DIAGNOSIS — T1490XA Injury, unspecified, initial encounter: Secondary | ICD-10-CM | POA: Diagnosis not present

## 2019-02-22 NOTE — Progress Notes (Signed)
Subjective:   Patient ID: Linda Lee, female   DOB: 83 y.o.   MRN: [id number]   HPI 83 year old female presents the office today for concerns of ingrown toenails mostly to both of her big toes.  All her nails are thick and have some discoloration of difficult to trim.  She denies any drainage or pus coming from the area.  She said no recent treatment.  She has no other concerns today.   Review of Systems  All other systems reviewed and are negative.  Past Medical History:  Diagnosis Date  . Anginal pain (HCC)   . Angio-edema   . Arthritis   . Atypical chest pain 07/06/2009   Qualifier: Diagnosis of  By: Linda JamesMcAlhany, MD, Linda Lee    . GERD (gastroesophageal reflux disease)   . Hyperlipemia   . Migraines   . Toxic effect of venom of bees, unintentional 06/03/2017    Past Surgical History:  Procedure Laterality Date  . ADENOIDECTOMY    . BACK SURGERY    . HEMORRHOID SURGERY    . RIGHT/LEFT HEART CATH AND CORONARY ANGIOGRAPHY N/A 06/17/2017   Procedure: RIGHT/LEFT HEART CATH AND CORONARY ANGIOGRAPHY;  Surgeon: Linda Lee, Jay, MD;  Location: [facility];  Service: Cardiovascular;  Laterality: N/A;  . 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 TONSILLECTOMY    . TOTAL KNEE ARTHROPLASTY Right 09/25/2015   Procedure: TOTAL KNEE ARTHROPLASTY;  Surgeon: Linda GrossFrank Aluisio, MD;  Location: [facility];  Service: Orthopedics;  Laterality: Right;     Current Outpatient Medications:  .  aspirin EC 325 MG tablet, Take 1 tablet (325 mg total) by mouth daily., Disp: 30 tablet, Rfl: 0 .  atorvastatin (LIPITOR) 20 MG tablet, Take 1 tablet (20 mg total) by mouth daily after supper., Disp: 30 tablet, Rfl: 0 .  carbidopa-levodopa (SINEMET IR) 25-100 MG tablet, TAKE 1 TABLET BY MOUTH THREE TIMES DAILY, Disp: 270 tablet, Rfl: 0 .  CRANBERRY PO, Take 300 mg daily by mouth., Disp: , Rfl:  .  Cyanocobalamin (VITAMIN B-12 PO), Take 1,200 mcg daily by mouth. , Disp: , Rfl:  .   EPINEPHrine 0.3 mg/0.3 mL IJ SOAJ injection, Inject 0.3 mLs (0.3 mg total) into the muscle once as needed (anaphylaxis)., Disp: 2 Device, Rfl: 0 .  Ergocalciferol (VITAMIN D2) 2000 units TABS, Take 2,000 Units daily by mouth. , Disp: , Rfl:  .  estradiol (ESTRACE) 0.1 MG/GM vaginal cream, Place 1 Applicatorful vaginally 2 (two) times a week. , Disp: , Rfl:  .  omeprazole (PRILOSEC) 40 MG capsule, Take 40 mg by mouth continuous as needed. , Disp: , Rfl:  .  propranolol ER (INDERAL LA) 80 MG 24 hr capsule, TAKE 1 CAPSULE(80 MG) BY MOUTH DAILY, Disp: 90 capsule, Rfl: 1 .  saccharomyces boulardii (FLORASTOR) 250 MG capsule, Take 250 mg by mouth daily. , Disp: , Rfl:   Allergies  Allergen Reactions  . Ciprofloxacin     Can not take due to hx of Cdiff per patient  . Wasp Venom Other (See Comments)    dizziness  . Fluconazole Rash        Objective:  Physical Exam  General: AAO x3, NAD  Dermatological: The nails appear to be mildly hypertrophic, dystrophic with yellow-brown discoloration.  Is incurvation present both medial lateral corners of the left and right hallux toenail.  There is no edema, erythema, drainage or pus.  Tenderness palpation mostly to the distal aspect of the toenails.  Vascular: DP, PT pulses mildly decreased still palpable.  There is no pain with calf compression, swelling, warmth, erythema.   Neruologic: Grossly intact via light touch bilateral. Vibratory intact via tuning fork bilateral. Protective threshold with Semmes Wienstein monofilament intact to all pedal sites bilateral. Patellar and Achilles deep tendon reflexes 2+ bilateral. No Babinski or clonus noted bilateral.   Musculoskeletal: No Lee boney pedal deformities bilateral. No pain, crepitus, or limitation noted with foot and ankle range of motion bilateral. Muscular strength 5/5 in all groups tested bilateral.     Assessment:   83 year old female with chronic ingrown toenails     Plan:  -Treatment  options discussed including all alternatives, risks, and complications -Etiology of symptoms were discussed -Discussed both conservative as well as conservative options.  ABI just to ensure adequate healing given mildly decreased pulses.  The left was 1.2 to the right was 1.23.  Backslash she will start on just trim the nails.  I sharply debrided the nails with any complications or bleeding.  If symptoms persist will do a partial nail avulsions.  Linda Lee DPM

## 2019-02-25 ENCOUNTER — Ambulatory Visit: Payer: Self-pay

## 2019-02-26 ENCOUNTER — Other Ambulatory Visit: Payer: Self-pay

## 2019-02-26 ENCOUNTER — Ambulatory Visit (INDEPENDENT_AMBULATORY_CARE_PROVIDER_SITE_OTHER): Payer: Medicare Other

## 2019-02-26 DIAGNOSIS — T63441D Toxic effect of venom of bees, accidental (unintentional), subsequent encounter: Secondary | ICD-10-CM

## 2019-03-03 ENCOUNTER — Other Ambulatory Visit: Payer: Self-pay

## 2019-03-03 MED ORDER — CARBIDOPA-LEVODOPA 25-100 MG PO TABS: 1.0000 | ORAL_TABLET | Freq: Three times a day (TID) | ORAL | 0 refills | Status: DC

## 2019-03-03 NOTE — Telephone Encounter (Signed)
Requested Prescriptions   Pending Prescriptions Disp Refills  . carbidopa-levodopa (SINEMET IR) 25-100 MG tablet 270 tablet 0    Sig: Take 1 tablet by mouth 3 (three) times daily.   Rx last filled: 4/630/20 #270 0 refills  Pt last seen: 12/07/18  Follow up appt scheduled: 03/26/19

## 2019-03-12 NOTE — Progress Notes (Signed)
Linda Lee was seen today in follow up for Parkinsons disease.  Pt is currently on carbidopa/levodopa 25/100, 1 tablet 3 times per day.  Still having some tremor.  Noting some drooling.  t denies falls.  Pt denies lightheadedness, near syncope.  No hallucinations.  Mood has been good.  Records made available to me have been reviewed.  She has been following with Dr. Fredna Dow in regards to a chainsaw injury.  She does have hx of TIA vs complicated migraine.  States that she had another episode 2 weeks ago and saw Dr. Joylene Draft 2 weeks ago.  She had some trouble talking and had trouble remembering her husbands name and had a headache after (which happened in the past).  Some cramping but better than it was.   PREVIOUS MEDICATIONS: Sinemet  ALLERGIES:   Allergies  Allergen Reactions  . Ciprofloxacin     Can not take due to hx of Cdiff per patient  . Wasp Venom Other (See Comments)    dizziness  . Fluconazole Rash    CURRENT MEDICATIONS:  Outpatient Encounter Medications as of 03/15/2019  Medication Sig  . aspirin EC 325 MG tablet Take 1 tablet (325 mg total) by mouth daily.  Marland Kitchen atorvastatin (LIPITOR) 20 MG tablet Take 1 tablet (20 mg total) by mouth daily after supper.  . carbidopa-levodopa (SINEMET IR) 25-100 MG tablet Take 1 tablet by mouth 3 (three) times daily.  Marland Kitchen CRANBERRY PO Take 300 mg daily by mouth.  . Cyanocobalamin (VITAMIN B-12 PO) Take 1,200 mcg daily by mouth.   . EPINEPHrine 0.3 mg/0.3 mL IJ SOAJ injection Inject 0.3 mLs (0.3 mg total) into the muscle once as needed (anaphylaxis).  . Ergocalciferol (VITAMIN D2) 2000 units TABS Take 2,000 Units daily by mouth.   . estradiol (ESTRACE) 0.1 MG/GM vaginal cream Place 1 Applicatorful vaginally 2 (two) times a week.   Marland Kitchen omeprazole (PRILOSEC) 40 MG capsule Take 40 mg by mouth continuous as needed.   . propranolol ER (INDERAL LA) 80 MG 24 hr capsule TAKE 1 CAPSULE(80 MG) BY MOUTH DAILY  . saccharomyces boulardii (FLORASTOR) 250  MG capsule Take 250 mg by mouth daily.    No facility-administered encounter medications on file as of 03/15/2019.     PAST MEDICAL HISTORY:   Past Medical History:  Diagnosis Date  . Anginal pain ([place])   . Angio-edema   . Arthritis   . Atypical chest pain 07/06/2009   Qualifier: Diagnosis of  By: Angelena Form, MD, Harrell Gave    . GERD (gastroesophageal reflux disease)   . Hyperlipemia   . Migraines   . Toxic effect of venom of bees, unintentional 06/03/2017    PAST SURGICAL HISTORY:   Past Surgical History:  Procedure Laterality Date  . ADENOIDECTOMY    . BACK SURGERY    . HEMORRHOID SURGERY    . RIGHT/LEFT HEART CATH AND CORONARY ANGIOGRAPHY N/A 06/17/2017   Procedure: RIGHT/LEFT HEART CATH AND CORONARY ANGIOGRAPHY;  Surgeon: Adrian Prows, MD;  Location: [facility];  Service: Cardiovascular;  Laterality: N/A;  . Thoracicc Aortic Tomasa Hose 07/2009     denies surgery for this  . TONSILLECTOMY    . TOTAL KNEE ARTHROPLASTY Right 09/25/2015   Procedure: TOTAL KNEE ARTHROPLASTY;  Surgeon: Gaynelle Arabian, MD;  Location: [facility];  Service: Orthopedics;  Laterality: Right;    SOCIAL HISTORY:   Social History   Socioeconomic History  . Marital status: Married    Spouse name: Not on file  .  Number of children: 3  . Years of education: Not on file  . Highest education level: Master's degree (e.g., MA, MS, MEng, MEd, MSW, MBA)  Occupational History  . Occupation: retired    Comment: Runner, broadcasting/film/videoteacher  Social Needs  . Financial resource strain: Not on file  . Food insecurity    Worry: Not on file    Inability: Not on file  . Transportation needs    Medical: Not on file    Non-medical: Not on file  Tobacco Use  . Smoking status: Former Smoker    Types: Cigarettes    Quit date: 08/15/1963    Years since quitting: 55.6  . Smokeless tobacco: Never Used  Substance and Sexual Activity  . Alcohol use: Yes    Alcohol/week: 1.0 standard drinks    Types: 1 Glasses of wine per week     Comment: q hs  . Drug use: No  . Sexual activity: Yes  Lifestyle  . Physical activity    Days per week: Not on file    Minutes per session: Not on file  . Stress: Not on file  Relationships  . Social Musicianconnections    Talks on phone: Not on file    Gets together: Not on file    Attends religious service: Not on file    Active member of club or organization: Not on file    Attends meetings of clubs or organizations: Not on file    Relationship status: Not on file  . Intimate partner violence    Fear of current or ex partner: Not on file    Emotionally abused: Not on file    Physically abused: Not on file    Forced sexual activity: Not on file  Other Topics Concern  . Not on file  Social History Narrative   Pt lives at home with spouse   Right handed   2 children    FAMILY HISTORY:   Family Status  Relation Name Status  . Mother  Deceased  . Father  Deceased  . Sister  Alive  . Daughter  Alive  . Son  Alive  . Son  Alive  . Neg Hx  (Not Specified)    ROS:  Review of Systems  Constitutional: Negative.   HENT: Negative.   Eyes: Negative.   Respiratory: Negative.   Cardiovascular: Negative.   Gastrointestinal: Negative.   Genitourinary: Negative.   Skin: Negative.     PHYSICAL EXAMINATION:    VITALS:   Vitals:   03/15/19 0817  BP: 110/70  Pulse: (!) 59  SpO2: 98%  Weight: 155 lb (70.3 kg)  Height: 5\' 4"  (1.626 m)   GEN:  The patient appears stated age and is in NAD. HEENT:  Normocephalic, atraumatic.  The mucous membranes are moist. The superficial temporal arteries are without ropiness or tenderness. CV:  Huston FoleyBrady.  regular Lungs:  CTAB Neck/HEME:  There are no carotid bruits bilaterally.  Neurological examination:  Orientation: The patient is alert and oriented x3. Cranial nerves: There is good facial symmetry. The speech is fluent and clear. Soft palate rises symmetrically and there is no tongue deviation. Hearing is intact to conversational tone.  Sensation: Sensation is intact to light touch throughout Motor: Strength is 5/5 in the bilateral upper and lower extremities.   Shoulder shrug is equal and symmetric.  There is no pronator drift.  Movement examination: Tone: There is mild increased tone in the RUE Abnormal movements: there is rare RUE resting tremor Coordination:  There is mild decremation with RAM's, with any form of RAMS, including alternating supination and pronation of the forearm, hand opening and closing, finger taps, heel taps and toe taps on the right Gait and Station: The patient has no difficulty arising out of a deep-seated chair without the use of the hands. The patient's stride length is good.      Lab Results  Component Value Date   CHOL 178 08/12/2018   HDL 56 08/12/2018   LDLCALC 108 (H) 08/12/2018   TRIG 58 08/12/2018   CHOLHDL 3.2 08/12/2018      ASSESSMENT/PLAN:  1.  Parkinson's disease  -Increase carbidopa/levodopa 25/100, 1.5 tablet 3 times per day.  -Follow-up with dermatology given relationship of melanoma to Parkinson's disease (has appt in fall)  2.  History of expressive aphasia  -most recent episode sounds like complicated migraine as was followed by headache.  She has had extensive work up in the past.  -MRI of brain and CTA of the head and neck was negative.   -On aspirin  -On Lipitor - called pcp and has not had labs since 03/2018 from their office.  Was going to do labs today (cmp, cbc, lipids) but pt requested that she just have done with pcp next month as has upcoming physical  3.  Sialorrhea  -This is commonly associated with PD.  We talked about treatments.  The patient is not a candidate for oral anticholinergic therapy because of increased risk of confusion and falls.  We discussed Botox (type A and B) and 1% atropine drops.  We discusssed that candy like lemon drops can help by stimulating mm of the oropharynx to induce swallowing.  She is going to try that first.  Not  interested yet in botox.  4.  Follow up is anticipated in the next 4-6 months, sooner should new neurologic issues arise.  Much greater than 50% of this visit was spent in counseling and coordinating care.  Total face to face time:  25 min   Cc:  Rodrigo RanPerini, Mark, MD

## 2019-03-15 ENCOUNTER — Other Ambulatory Visit: Payer: Self-pay

## 2019-03-15 ENCOUNTER — Ambulatory Visit (INDEPENDENT_AMBULATORY_CARE_PROVIDER_SITE_OTHER): Payer: Medicare Other | Admitting: Neurology

## 2019-03-15 ENCOUNTER — Encounter: Payer: Self-pay | Admitting: Neurology

## 2019-03-15 VITALS — BP 110/70 | HR 59 | Temp 98.0°F | Ht 64.0 in | Wt 155.0 lb

## 2019-03-15 DIAGNOSIS — Z8673 Personal history of transient ischemic attack (TIA), and cerebral infarction without residual deficits: Secondary | ICD-10-CM | POA: Diagnosis not present

## 2019-03-15 DIAGNOSIS — G43109 Migraine with aura, not intractable, without status migrainosus: Secondary | ICD-10-CM

## 2019-03-15 DIAGNOSIS — E785 Hyperlipidemia, unspecified: Secondary | ICD-10-CM

## 2019-03-15 DIAGNOSIS — G20 Parkinson's disease: Secondary | ICD-10-CM

## 2019-03-15 DIAGNOSIS — G20A1 Parkinson's disease without dyskinesia, without mention of fluctuations: Secondary | ICD-10-CM

## 2019-03-15 NOTE — Patient Instructions (Signed)
Increase carbidopa/levodopa 25/100, 1.5 tablets at 7am/11am/4pm

## 2019-03-25 ENCOUNTER — Ambulatory Visit (INDEPENDENT_AMBULATORY_CARE_PROVIDER_SITE_OTHER): Payer: Medicare Other

## 2019-03-25 ENCOUNTER — Other Ambulatory Visit: Payer: Self-pay

## 2019-03-25 DIAGNOSIS — T63441D Toxic effect of venom of bees, accidental (unintentional), subsequent encounter: Secondary | ICD-10-CM | POA: Diagnosis not present

## 2019-03-26 ENCOUNTER — Ambulatory Visit: Payer: Medicare Other | Admitting: Neurology

## 2019-04-06 ENCOUNTER — Encounter: Payer: Self-pay | Admitting: Neurology

## 2019-04-22 ENCOUNTER — Ambulatory Visit (INDEPENDENT_AMBULATORY_CARE_PROVIDER_SITE_OTHER): Payer: Medicare Other

## 2019-04-22 ENCOUNTER — Other Ambulatory Visit: Payer: Self-pay

## 2019-04-22 DIAGNOSIS — T63441D Toxic effect of venom of bees, accidental (unintentional), subsequent encounter: Secondary | ICD-10-CM

## 2019-04-23 ENCOUNTER — Telehealth: Payer: Self-pay | Admitting: Neurology

## 2019-04-23 NOTE — Telephone Encounter (Signed)
Labs received from primary care.  Labs are dated April 06, 2019.  Sodium was 143, potassium 4.5, chloride 107, CO2 25, creatinine 0.8, AST 15, ALT less than 6, alkaline phosphatase 109.  White blood cells 4.9, hemoglobin 12.8, hematocrit 40.1 and platelets 142.  TSH is 2.53.

## 2019-05-20 ENCOUNTER — Other Ambulatory Visit: Payer: Self-pay

## 2019-05-20 ENCOUNTER — Ambulatory Visit (INDEPENDENT_AMBULATORY_CARE_PROVIDER_SITE_OTHER): Payer: Medicare Other

## 2019-05-20 DIAGNOSIS — T63441D Toxic effect of venom of bees, accidental (unintentional), subsequent encounter: Secondary | ICD-10-CM | POA: Diagnosis not present

## 2019-05-24 ENCOUNTER — Encounter: Payer: Self-pay | Admitting: Podiatry

## 2019-05-24 ENCOUNTER — Other Ambulatory Visit: Payer: Self-pay

## 2019-05-24 ENCOUNTER — Ambulatory Visit: Payer: Medicare Other | Admitting: Podiatry

## 2019-05-24 DIAGNOSIS — L600 Ingrowing nail: Secondary | ICD-10-CM | POA: Diagnosis not present

## 2019-05-24 DIAGNOSIS — M79675 Pain in left toe(s): Secondary | ICD-10-CM

## 2019-05-24 DIAGNOSIS — L84 Corns and callosities: Secondary | ICD-10-CM | POA: Diagnosis not present

## 2019-05-24 DIAGNOSIS — B351 Tinea unguium: Secondary | ICD-10-CM

## 2019-05-24 DIAGNOSIS — M79674 Pain in right toe(s): Secondary | ICD-10-CM

## 2019-05-25 NOTE — Progress Notes (Signed)
Subjective: 83 y.o. returns the office today for painful, elongated, thickened toenails which she cannot trim herself. Denies any redness or drainage around the nails.  She gets calluses around big toes medially.  She has an occasional ingrown toenail of the right big toe into the toenail causing discomfort she did not trim as herself.  Denies any acute changes since last appointment and no new complaints today. Denies any systemic complaints such as fevers, chills, nausea, vomiting.   PCP: Crist Infante, MD  Objective: AAO 3, NAD DP/PT pulses palpable, CRT less than 3 seconds trophic, elongated, brittle, discolored 10. There is tenderness overlying the nails 1-5 bilaterally.  Mild incurvation of the right hallux toenail distally but also the left side.  There is no surrounding erythema or drainage along the nail sites. Mild hyperkeratotic tissue bilateral medial hallux with any underlying ulceration drainage or any signs of infection. No pain with calf compression, swelling, warmth, erythema.  Assessment: Patient presents with symptomatic onychomycosis; hyperkeratotic lesions  Plan: -Treatment options including alternatives, risks, complications were discussed -Nails sharply debrided 10 without complication/bleeding. -Hyperkeratotic lesion sharply debrided x2 without any complications or bleeding.  -Discussed daily foot inspection. If there are any changes, to call the office immediately.  -Follow-up in 3 months or sooner if any problems are to arise. In the meantime, encouraged to call the office with any questions, concerns, changes symptoms.  Celesta Gentile, DPM

## 2019-05-26 ENCOUNTER — Other Ambulatory Visit: Payer: Self-pay

## 2019-05-26 MED ORDER — PROPRANOLOL HCL ER 80 MG PO CP24: ORAL_CAPSULE | ORAL | 1 refills | Status: DC

## 2019-05-31 ENCOUNTER — Other Ambulatory Visit: Payer: Self-pay

## 2019-05-31 MED ORDER — CARBIDOPA-LEVODOPA 25-100 MG PO TABS: 1.0000 | ORAL_TABLET | Freq: Three times a day (TID) | ORAL | 0 refills | Status: DC

## 2019-06-17 ENCOUNTER — Other Ambulatory Visit: Payer: Self-pay

## 2019-06-17 ENCOUNTER — Ambulatory Visit (INDEPENDENT_AMBULATORY_CARE_PROVIDER_SITE_OTHER): Payer: Medicare Other

## 2019-06-17 DIAGNOSIS — T63441D Toxic effect of venom of bees, accidental (unintentional), subsequent encounter: Secondary | ICD-10-CM | POA: Diagnosis not present

## 2019-07-14 ENCOUNTER — Other Ambulatory Visit: Payer: Self-pay

## 2019-07-14 ENCOUNTER — Ambulatory Visit (INDEPENDENT_AMBULATORY_CARE_PROVIDER_SITE_OTHER): Payer: Medicare Other

## 2019-07-14 DIAGNOSIS — T63441D Toxic effect of venom of bees, accidental (unintentional), subsequent encounter: Secondary | ICD-10-CM | POA: Diagnosis not present

## 2019-07-15 ENCOUNTER — Ambulatory Visit: Payer: Self-pay

## 2019-08-11 ENCOUNTER — Ambulatory Visit (INDEPENDENT_AMBULATORY_CARE_PROVIDER_SITE_OTHER): Payer: Medicare PPO

## 2019-08-11 ENCOUNTER — Other Ambulatory Visit: Payer: Self-pay

## 2019-08-11 DIAGNOSIS — T63441D Toxic effect of venom of bees, accidental (unintentional), subsequent encounter: Secondary | ICD-10-CM

## 2019-08-20 ENCOUNTER — Ambulatory Visit: Payer: Medicare Other | Admitting: Neurology

## 2019-08-20 ENCOUNTER — Telehealth: Payer: Medicare Other | Admitting: Neurology

## 2019-08-30 ENCOUNTER — Ambulatory Visit: Payer: Medicare Other | Admitting: Podiatry

## 2019-08-30 ENCOUNTER — Other Ambulatory Visit: Payer: Self-pay

## 2019-08-30 ENCOUNTER — Ambulatory Visit: Payer: Medicare Other

## 2019-08-30 DIAGNOSIS — L600 Ingrowing nail: Secondary | ICD-10-CM

## 2019-08-30 DIAGNOSIS — M79675 Pain in left toe(s): Secondary | ICD-10-CM | POA: Diagnosis not present

## 2019-08-30 DIAGNOSIS — M79674 Pain in right toe(s): Secondary | ICD-10-CM

## 2019-08-30 DIAGNOSIS — B351 Tinea unguium: Secondary | ICD-10-CM

## 2019-08-30 DIAGNOSIS — L84 Corns and callosities: Secondary | ICD-10-CM

## 2019-08-30 NOTE — Progress Notes (Signed)
Subjective: 84 y.o. returns the office today for painful, elongated, thickened toenails which she cannot trim herself. Denies any redness or drainage around the nails. Denies any acute changes since last appointment and no new complaints today. Denies any systemic complaints such as fevers, chills, nausea, vomiting.   PCP: Rodrigo Ran, MD  Objective: AAO 3, NAD DP/PT pulses palpable, CRT less than 3 seconds trophic, elongated, brittle, discolored 10. There is tenderness overlying the nails 1-5 bilaterally.  Mild incurvation of the right hallux toenail distally but also the left side.  There is no surrounding erythema or drainage along the nail sites. Minimal hyperkeratotic tissue bilateral medial hallux with any underlying ulceration drainage or any signs of infection. No pain with calf compression, swelling, warmth, erythema.  Assessment: Patient presents with symptomatic onychomycosis  Plan: -Treatment options including alternatives, risks, complications were discussed -Nails sharply debrided 10 without complication/bleeding. -Hyperkeratotic lesion sharply debrided x2 without any complications or bleeding.  -Discussed daily foot inspection. If there are any changes, to call the office immediately.  -Follow-up in 3 months or sooner if any problems are to arise. In the meantime, encouraged to call the office with any questions, concerns, changes symptoms.  Ovid Curd, DPM

## 2019-08-31 ENCOUNTER — Ambulatory Visit: Payer: Medicare PPO

## 2019-09-06 ENCOUNTER — Ambulatory Visit (INDEPENDENT_AMBULATORY_CARE_PROVIDER_SITE_OTHER): Payer: Medicare PPO

## 2019-09-06 ENCOUNTER — Other Ambulatory Visit: Payer: Self-pay

## 2019-09-06 DIAGNOSIS — T63441D Toxic effect of venom of bees, accidental (unintentional), subsequent encounter: Secondary | ICD-10-CM | POA: Diagnosis not present

## 2019-09-08 ENCOUNTER — Ambulatory Visit: Payer: Self-pay

## 2019-09-10 ENCOUNTER — Ambulatory Visit: Payer: Medicare PPO | Attending: Internal Medicine

## 2019-09-10 DIAGNOSIS — Z23 Encounter for immunization: Secondary | ICD-10-CM | POA: Insufficient documentation

## 2019-09-10 NOTE — Progress Notes (Signed)
   Covid-19 Vaccination Clinic  Name:  Linda Lee    MRN: [id number] DOB: [birth year]  09/10/2019  [name] was observed post Covid-19 immunization for 15 minutes without incidence. She was provided with Vaccine Information Sheet and instruction to access the V-Safe system.   [name] was instructed to call 911 with any severe reactions post vaccine: Marland Kitchen Difficulty breathing  . Swelling of your face and throat  . A fast heartbeat  . A bad rash all over your body  . Dizziness and weakness    Immunizations Administered    Name Date Dose VIS Date Route   Pfizer COVID-19 Vaccine 09/10/2019  8:59 AM 0.3 mL 07/16/2019 Intramuscular   Manufacturer: [employer], [employer]   Lot: [id number]   NDC: [address]-4

## 2019-10-04 ENCOUNTER — Ambulatory Visit: Payer: Self-pay

## 2019-10-05 ENCOUNTER — Ambulatory Visit: Payer: Medicare PPO | Attending: Internal Medicine

## 2019-10-05 DIAGNOSIS — Z23 Encounter for immunization: Secondary | ICD-10-CM | POA: Insufficient documentation

## 2019-10-05 NOTE — Progress Notes (Signed)
   Covid-19 Vaccination Clinic  Name:  Linda Lee    MRN: [id number] DOB: 01-06-1936  10/05/2019  Linda Lee was observed post Covid-19 immunization for 15 minutes without incident. She was provided with Vaccine Information Sheet and instruction to access the V-Safe system.   Linda Lee was instructed to call 911 with any severe reactions post vaccine: Marland Kitchen Difficulty breathing  . Swelling of face and throat  . A fast heartbeat  . A bad rash all over body  . Dizziness and weakness   Immunizations Administered    Name Date Dose VIS Date Route   Pfizer COVID-19 Vaccine 10/05/2019  9:12 AM 0.3 mL 07/16/2019 Intramuscular   Manufacturer: [employer], [employer]   Lot: [id number]   NDC: [address]-1

## 2019-10-06 ENCOUNTER — Encounter: Payer: Self-pay | Admitting: Cardiology

## 2019-10-06 ENCOUNTER — Ambulatory Visit: Payer: Medicare PPO | Admitting: Cardiology

## 2019-10-06 ENCOUNTER — Other Ambulatory Visit: Payer: Self-pay

## 2019-10-06 VITALS — BP 138/66 | HR 61 | Temp 98.1°F | Ht 64.0 in | Wt 166.4 lb

## 2019-10-06 DIAGNOSIS — G20 Parkinson's disease: Secondary | ICD-10-CM

## 2019-10-06 DIAGNOSIS — I2510 Atherosclerotic heart disease of native coronary artery without angina pectoris: Secondary | ICD-10-CM

## 2019-10-06 DIAGNOSIS — G20A1 Parkinson's disease without dyskinesia, without mention of fluctuations: Secondary | ICD-10-CM

## 2019-10-06 DIAGNOSIS — Z8673 Personal history of transient ischemic attack (TIA), and cerebral infarction without residual deficits: Secondary | ICD-10-CM | POA: Diagnosis not present

## 2019-10-06 DIAGNOSIS — I7121 Aneurysm of the ascending aorta, without rupture: Secondary | ICD-10-CM

## 2019-10-06 DIAGNOSIS — G43809 Other migraine, not intractable, without status migrainosus: Secondary | ICD-10-CM

## 2019-10-06 DIAGNOSIS — I712 Thoracic aortic aneurysm, without rupture: Secondary | ICD-10-CM

## 2019-10-06 NOTE — Progress Notes (Signed)
Primary Physician/Referring:  Crist Infante, MD  Patient ID: Linda Lee, female    DOB: [birth year], 84 y.o.   MRN: [id number]  Chief Complaint  Patient presents with  . AAA    1 year follow up  . Hyperlipidemia  . Coronary Artery Disease   HPI:    Linda Lee  is a 84 y.o. with Parkinson's disease, hyperlipidemia, 4.5 cm ascending thoracicaortic aneurysm, complex migraines, and coronary calcification on CT scan. She has had episodes of trouble talking and difficulty expressing herself and headache afterwards. Differential diagnosis is TIA vs complicated migraine. Last episode was in August 2020. She has had negative echocardiogram and event monitor in 2020.  She is followed by Dr. Carles Collet.  She was last seen 1 year ago, has not had repeat CTA of the chest.  Overall she feels that she is doing well. Has chronic dyspnea on exertion has been stable.  She also has history of Parkinson's that has been stable.  Past Medical History:  Diagnosis Date  . Anginal pain ([place])   . Angio-edema   . Arthritis   . Atypical chest pain 07/06/2009   Qualifier: Diagnosis of  By: Angelena Form, MD, Harrell Gave    . GERD (gastroesophageal reflux disease)   . Hyperlipemia   . Migraines   . Toxic effect of venom of bees, unintentional 06/03/2017   Past Surgical History:  Procedure Laterality Date  . ADENOIDECTOMY    . BACK SURGERY    . HEMORRHOID SURGERY    . RIGHT/LEFT HEART CATH AND CORONARY ANGIOGRAPHY N/A 06/17/2017   Procedure: RIGHT/LEFT HEART CATH AND CORONARY ANGIOGRAPHY;  Surgeon: Adrian Prows, MD;  Location: [facility];  Service: Cardiovascular;  Laterality: N/A;  . Thoracicc Aortic Tomasa Hose 07/2009     denies surgery for this  . TONSILLECTOMY    . TOTAL KNEE ARTHROPLASTY Right 09/25/2015   Procedure: TOTAL KNEE ARTHROPLASTY;  Surgeon: Gaynelle Arabian, MD;  Location: [facility];  Service: Orthopedics;  Laterality: Right;   Social History   Tobacco Use  . Smoking status: Former  Smoker    Types: Cigarettes    Quit date: 08/15/1963    Years since quitting: 56.1  . Smokeless tobacco: Never Used  Substance Use Topics  . Alcohol use: Yes    Alcohol/week: 1.0 standard drinks    Types: 1 Glasses of wine per week    Comment: q hs    ROS  Review of Systems  Cardiovascular: Positive for dyspnea on exertion (chronic, stable). Negative for chest pain, claudication, leg swelling, orthopnea, palpitations and syncope.  Neurological: Positive for tremors (Parkinsons). Negative for dizziness, focal weakness and headaches.  All other systems reviewed and are negative.  Objective  Blood pressure 138/66, pulse 61, temperature 98.1 F (36.7 C), height 5\' 4"  (1.626 m), weight 166 lb 6.4 oz (75.5 kg), SpO2 98 %.  Vitals with BMI 10/06/2019 03/15/2019 12/07/2018  Height 5\' 4"  5\' 4"  5' 4.5"  Weight 166 lbs 6 oz 155 lbs 155 lbs  BMI 28.55 20.94 70.9  Systolic 628 366 294  Diastolic 66 70 77  Pulse 61 59 49     Physical Exam  Constitutional: She is oriented to person, place, and time. Vital signs are normal. She appears well-developed and well-nourished.  Cardiovascular: Normal rate, regular rhythm, normal heart sounds and intact distal pulses.  Pulmonary/Chest: Effort normal and breath sounds normal. No accessory muscle usage. No respiratory distress.  Neurological: She is alert and oriented to person, place, and time.  Vitals reviewed.  Laboratory examination:   No results for input(s): NA, K, CL, CO2, GLUCOSE, BUN, CREATININE, CALCIUM, GFRNONAA, GFRAA in the last 8760 hours. CrCl cannot be calculated (Patient's most recent lab result is older than the maximum 21 days allowed.).  CMP Latest Ref Rng & Units 08/11/2018 08/11/2018 08/11/2018  Glucose 70 - 99 mg/dL 86 90 92  BUN 8 - 23 mg/dL 20 39(Q) 18  Creatinine 0.44 - 1.00 mg/dL 3.00 9.23 3.00  Sodium 135 - 145 mmol/L 142 131(L) 141  Potassium 3.5 - 5.1 mmol/L 3.9 8.4(HH) 3.8  Chloride 98 - 111 mmol/L 107 101 109  CO2 22 - 32  mmol/L - - 23  Calcium 8.9 - 10.3 mg/dL - - 9.0  Total Protein 6.5 - 8.1 g/dL - - 6.9  Total Bilirubin 0.3 - 1.2 mg/dL - - 0.8  Alkaline Phos 38 - 126 U/L - - 89  AST 15 - 41 U/L - - 15  ALT 0 - 44 U/L - - <5   CBC Latest Ref Rng & Units 08/11/2018 08/11/2018 08/11/2018  WBC 4.0 - 10.5 K/uL - - 4.3  Hemoglobin 12.0 - 15.0 g/dL 76.2 17.3(H) 13.5  Hematocrit 36.0 - 46.0 % 40.0 51.0(H) 41.6  Platelets 150 - 400 K/uL - - 157   Lipid Panel     Component Value Date/Time   CHOL 178 08/12/2018 1012   TRIG 58 08/12/2018 1012   HDL 56 08/12/2018 1012   CHOLHDL 3.2 08/12/2018 1012   VLDL 6 09/04/2012 0550   LDLCALC 108 (H) 08/12/2018 1012   HEMOGLOBIN A1C Lab Results  Component Value Date   HGBA1C 5.2 08/11/2018   MPG 102.54 08/11/2018   TSH No results for input(s): TSH in the last 8760 hours.  External labs: HDL 44 MG/DL 09/10/3333 LDL 45.625 mg 04/06/2019 Cholesterol, total 133.000 m 04/06/2019 Triglycerides 46.000 04/06/2019 A1C 5.200 08/11/2018 Glucose Random N 04/13/2019 MicroAlbumin Urine 25.000 04/14/2019 MicroAlbumin/Creat 16.7 MG/ 04/14/2019 BUN 24.000 mg 04/06/2019 Creatinine, Serum 0.800 mg/ 04/06/2019 TSH 2.530 micr 04/06/2019  Medications and allergies   Allergies  Allergen Reactions  . Ciprofloxacin     Can not take due to hx of Cdiff per patient  . Wasp Venom Other (See Comments)    dizziness  . Fluconazole Rash     Current Outpatient Medications  Medication Instructions  . aspirin EC 325 mg, Oral, Daily  . atorvastatin (LIPITOR) 20 mg, Oral, Daily after supper  . carbidopa-levodopa (SINEMET IR) 25-100 MG tablet 1 tablet, Oral, 3 times daily  . co-enzyme Q-10 30 mg, Oral, 3 times daily  . CRANBERRY PO 300 mg, Oral, Daily  . Cyanocobalamin (VITAMIN B-12 PO) 1,200 mcg, Oral, Daily  . EPINEPHrine (EPI-PEN) 0.3 mg, Intramuscular, Once PRN  . estradiol (ESTRACE) 0.1 MG/GM vaginal cream 1 Applicatorful, Vaginal, 2 times weekly  . estrogens (conjugated) (PREMARIN) 0.625 mg, Oral,  Daily, Take daily for 21 days then do not take for 7 days.   . furosemide (LASIX) 20 mg, Oral, Take 1 tablet  M,W,F   . KLOR-CON M10 10 MEQ tablet No dose, route, or frequency recorded.  Marland Kitchen omeprazole (PRILOSEC) 40 mg, Oral, Continuous PRN  . potassium chloride (K-DUR) 10 MEQ tablet 10 mEq, Oral, Daily, Take 1 tablet M,W, F   . propranolol ER (INDERAL LA) 80 MG 24 hr capsule TAKE 1 CAPSULE(80 MG) BY MOUTH DAILY  . saccharomyces boulardii (FLORASTOR) 250 mg, Oral, Daily  . vancomycin (VANCOCIN) 125 mg, Oral, 4 times daily  . Vitamin  D2 2,000 Units, Oral, Daily    Radiology:  No results found.  Cardiac Studies:   30 day event monitor 01/22-02/20/2020: Dominant rhythm is normal sinus. 3 patient reported events without any symptoms correlated with sinus rhythm. 2 auto detected events of NSVT on 01/30 for 4 beats and 02/14 for 6 beats that were both asymptomatic. Lowest HR was 43 during sleep and fastest HR was 125 bpm. No A fib was noted.   Double contrast echocardiogram 08/26/2018: Normal LV systolic function, no intracardiac shunting noted. Limited study.  Right and left heart cath 06/17/2017: Normal right and left heart cath. RA 8/5/4, 71%, RV 28//3/7; PA 29/6/18, sat 72%; PW 13/10/8, Ao sat 99%. CO 4.8, CI 2.73. Qp:Qs 1.04.  CT angiogram chest 08/11/2018: No change from CT scan done previously on 08/18/2017: Comparison 05/10/2017: Aortic atherosclerosis, ascending aorta measures 4.5 cm, stable. Liver cysts.  08/10/2018: MRI brain and CTA neck normal  Assessment     ICD-10-CM   1. Coronary artery calcification seen on CT scan  I25.10 EKG 12-Lead  2. History of TIA (transient ischemic attack)  Z86.73   3. Other migraine without status migrainosus, not intractable  G43.809   4. Ascending aortic aneurysm ([facility])  I71.2   5. Parkinson's disease ([facility])  G20     EKG 10/06/2019: Sinus bradycardia at 55 bpm, normal axis, no evidence of ischemia.  No orders of the defined types were placed in  this encounter.   There are no discontinued medications.   Recommendations:   Linda Lee  is a 84 y.o. with Parkinson's disease, hyperlipidemia, 4.5 cm ascending thoracicaortic aneurysm, complex migraines, and coronary calcification on CT scan. She has had episodes difficulty expressing herself and headache afterwards. Differential diagnosis is TIA vs complicated migraine. Last episode was in August 2020. She has had negative echocardiogram and event monitor in 2020.  She is followed by Dr. Arbutus Leas.  Patient is overall doing well since last seen by [place] 1 year ago. Last episode of sudden difficulty expressing herself and headaches was approximately 6 months ago. Cardiac workup has been unyielding. Continue to feel related to complicated migraine. Would recommend continued aggressive risk factor modification. I have reviewed her lipids performed in Sept 2020, LDL slightly elevated. She is on Lipitor, would recommend repeating lipid panel in the next few months. If LDL not less than 70, would recommend increasing Lipitor in view of coronary artery calcification on CT scan. No symptoms of angina.   She will need repeat CTA of chest for follow up on ascending aortic aneurysm. Echocardiograms have not correlated in the past. Will notify her of the results once available. Blood pressure is well controlled. Continue with Propanolol for migraines. Will see her back in 1 year or sooner if problems.   Toniann Fail, MSN, APRN, FNP-C [facility] [place] Office: [phone] Fax: [phone]

## 2019-10-08 ENCOUNTER — Telehealth: Payer: Self-pay

## 2019-10-08 NOTE — Telephone Encounter (Signed)
Please add new order for CT Angio of Chest due to 2019 order expiring.

## 2019-10-08 NOTE — Progress Notes (Addendum)
Linda Lee was seen today in follow up for Parkinsons disease.  My previous records were reviewed prior to todays visit as well as outside records available to me.   She states that she was doing the 1.5 tablets tid but she ran out of medication and went back to 1 po tid.  Pt denies falls.  She has not been doing as much as exercise as she wants.   Pt denies lightheadedness, near syncope.  No hallucinations.  Mood has been good, but admits to stress, mostly associated with caregiving and Covid.  She is drooling more.    Current prescribed movement disorder medications: Carbidopa/levodopa 25/100, 1.5 tablets 3 times per day (increased last visit)   PREVIOUS MEDICATIONS: Sinemet  ALLERGIES:   Allergies  Allergen Reactions  . Ciprofloxacin     Can not take due to hx of Cdiff per patient  . Wasp Venom Other (See Comments)    dizziness  . Fluconazole Rash    CURRENT MEDICATIONS:  Outpatient Encounter Medications as of 10/11/2019  Medication Sig  . aspirin EC 325 MG tablet Take 1 tablet (325 mg total) by mouth daily.  Marland Kitchen atorvastatin (LIPITOR) 20 MG tablet Take 1 tablet (20 mg total) by mouth daily after supper.  . carbidopa-levodopa (SINEMET IR) 25-100 MG tablet Take 1 tablet by mouth 3 (three) times daily.  Marland Kitchen co-enzyme Q-10 30 MG capsule Take 30 mg by mouth daily.   Marland Kitchen CRANBERRY PO Take 300 mg daily by mouth.  . Cyanocobalamin (VITAMIN B-12 PO) Take 1,200 mcg daily by mouth.   . EPINEPHrine 0.3 mg/0.3 mL IJ SOAJ injection Inject 0.3 mLs (0.3 mg total) into the muscle once as needed (anaphylaxis).  . Ergocalciferol (VITAMIN D2) 2000 units TABS Take 2,000 Units daily by mouth.   . estradiol (ESTRACE) 0.1 MG/GM vaginal cream Place 1 Applicatorful vaginally 2 (two) times a week.   . furosemide (LASIX) 20 MG tablet Take 20 mg by mouth. Take 1 tablet  M,W,F  . KLOR-CON M10 10 MEQ tablet Take 1 tablet on M/W/F  . omeprazole (PRILOSEC) 40 MG capsule Take 40 mg by mouth continuous as  needed.   . propranolol ER (INDERAL LA) 80 MG 24 hr capsule TAKE 1 CAPSULE(80 MG) BY MOUTH DAILY  . saccharomyces boulardii (FLORASTOR) 250 MG capsule Take 250 mg by mouth daily.   . [DISCONTINUED] estrogens, conjugated, (PREMARIN) 0.625 MG tablet Take 0.625 mg by mouth daily. Take daily for 21 days then do not take for 7 days.  . [DISCONTINUED] potassium chloride (K-DUR) 10 MEQ tablet Take 10 mEq by mouth daily. Take 1 tablet M,W, F  . [DISCONTINUED] vancomycin (VANCOCIN) 125 MG capsule Take 125 mg by mouth 4 (four) times daily.   No facility-administered encounter medications on file as of 10/11/2019.    PHYSICAL EXAMINATION:    VITALS:   Vitals:   10/11/19 1341  BP: 122/68  Pulse: 60  SpO2: 99%  Weight: 166 lb (75.3 kg)  Height: 5' 4.5" (1.638 m)    GEN:  The patient appears stated age and is in NAD. HEENT:  Normocephalic, atraumatic.  The mucous membranes are moist. The superficial temporal arteries are without ropiness or tenderness. CV:  RRR Lungs:  CTAB Neck/HEME:  There are no carotid bruits bilaterally.  Neurological examination:  Orientation: The patient is alert and oriented x3. Cranial nerves: There is good facial symmetry with no significant facial hypomimia. The speech is fluent and clear. Soft palate rises symmetrically and there  is no tongue deviation. Hearing is intact to conversational tone. Sensation: Sensation is intact to light touch throughout Motor: Strength is at least antigravity x4.  Movement examination: Tone: There is normal tone in the upper extremities/lower extremity Abnormal movements: Intermittent right upper extremity rest tremor.  Intermittent right lower extremity dyskinesia, very mild Coordination:  There is minimal decremation with RAM's Gait and Station: The patient has no difficulty arising out of a deep-seated chair without the use of the hands. The patient's stride length is fairly good.  She is mildly flexed at the waist.  She does have  mild reemergent tremor of the right upper extremity with ambulation.    I have reviewed and interpreted the following labs independently  Labs received from primary care.  Labs are dated April 06, 2019.  Sodium was 143, potassium 4.5, chloride 107, CO2 25, creatinine 0.8, AST 15, ALT less than 6, alkaline phosphatase 109.  White blood cells 4.9, hemoglobin 12.8, hematocrit 40.1 and platelets 142.  TSH is 2.53.  ASSESSMENT/PLAN:  1.  Parkinsons Disease  -Increase carbidopa/levodopa 25/100, 1.5 tablets 3 times per day.  We had done this last visit, but the prescription did not reflect day and she ran out early and ended up filling an old prescription, so went back down on the dose.  Refilled the proper dose today.  -Follows with dermatology for yearly skin visits.  -Talked to her today about getting involved with the various exercise programs, although most of them are online.  Had her meet our Education officer, museum.  I thought she might do well with some of the support groups as well as counseling.  She is in a difficult situation as she is both a patient, as well as a caregiver.  This is a tough position to be in.  2.  History of expressive aphasia  -Likely was complicated migraine.  -Extensive work-up that was negative.  -Is on aspirin and Lipitor.  She asks if she can change to asa, 81 mg and no objection to that.  3.  Sialorrhea  -This is commonly associated with PD.  We talked about treatments.  The patient is not a candidate for oral anticholinergic therapy because of increased risk of confusion and falls.  We discussed Botox (type A and B) and 1% atropine drops.  We discusssed that candy like lemon drops can help by stimulating mm of the oropharynx to induce swallowing.  Not interested in Botox right now.  Addendum 10/12/19:  Pt came in for husbands appt today and stated she forgot to mention that she was having L foot paresthesias.  Has chronic back pain.  Some pain in left leg b/c of it.  Asks  about EMG.  Will order.  Total time spent on today's visit was 30 minutes, including both face-to-face time and nonface-to-face time.  Time included that spent on review of records (prior notes available to me/labs/imaging if pertinent), discussing treatment and goals, answering patient's questions and coordinating care.  Cc:  Crist Infante, MD

## 2019-10-11 ENCOUNTER — Encounter: Payer: Self-pay | Admitting: Neurology

## 2019-10-11 ENCOUNTER — Ambulatory Visit (INDEPENDENT_AMBULATORY_CARE_PROVIDER_SITE_OTHER): Payer: Medicare PPO | Admitting: *Deleted

## 2019-10-11 ENCOUNTER — Other Ambulatory Visit: Payer: Self-pay

## 2019-10-11 ENCOUNTER — Ambulatory Visit: Payer: Medicare PPO | Admitting: Neurology

## 2019-10-11 ENCOUNTER — Ambulatory Visit (INDEPENDENT_AMBULATORY_CARE_PROVIDER_SITE_OTHER): Payer: Medicare PPO | Admitting: Clinical

## 2019-10-11 VITALS — BP 122/68 | HR 60 | Ht 64.5 in | Wt 166.0 lb

## 2019-10-11 DIAGNOSIS — G20 Parkinson's disease: Secondary | ICD-10-CM

## 2019-10-11 DIAGNOSIS — K117 Disturbances of salivary secretion: Secondary | ICD-10-CM

## 2019-10-11 DIAGNOSIS — T63441D Toxic effect of venom of bees, accidental (unintentional), subsequent encounter: Secondary | ICD-10-CM | POA: Diagnosis not present

## 2019-10-11 DIAGNOSIS — G43109 Migraine with aura, not intractable, without status migrainosus: Secondary | ICD-10-CM

## 2019-10-11 DIAGNOSIS — Z719 Counseling, unspecified: Secondary | ICD-10-CM

## 2019-10-11 MED ORDER — CARBIDOPA-LEVODOPA 25-100 MG PO TABS: 1.5000 | ORAL_TABLET | Freq: Three times a day (TID) | ORAL | 1 refills | Status: DC

## 2019-10-11 NOTE — Patient Instructions (Signed)
1.  Increase carbidopa/levodopa 25/100, 1.5 tablets three times per day 2.  Let me know if you want to do the botox  The physicians and staff at [facility] are committed to providing excellent care. You may receive a survey requesting feedback about your experience at our office. We strive to receive "very good" responses to the survey questions. If you feel that your experience would prevent you from giving the office a "very good " response, please contact our office to try to remedy the situation. We may be reached at [phone]. Thank you for taking the time out of your busy day to complete the survey.

## 2019-10-11 NOTE — Telephone Encounter (Signed)
Dr. Maren Beach has already placed a CT order for March 31st it looks like

## 2019-10-12 NOTE — Addendum Note (Signed)
Addended by: Kandice Robinsons T on: 10/12/2019 03:27 PM   Modules accepted: Orders

## 2019-10-13 NOTE — Progress Notes (Signed)
04/06/2019: Creatinine 0.8, EGFR 60/83, potassium 4.5, CMP normal.  RBC 4.1, normal H&H, CBC otherwise normal.  Cholesterol 133, triglycerides 46, HDL 44, LDL 80.  TSH 2.5.

## 2019-10-14 NOTE — BH Specialist Note (Signed)
Referring Provider: Kerin Salen, DO Date of Referral: 10/11/2019 Primary Reason for Referral: patient education. Stress management  Location of Visit: Individual, office visit  Suicide/Homicide Risk: Pt denies risk Subjective Notes: [facility]ed [facility] in the past husband has Parkinsonism Involved with [facility] caregiver group  Psychosocial Assessment Patient presents today for psychoeducation with LCSW following referral from provider Dr. Lurena Joiner Tat. LCSW provided patient education on nonmotor Parkinson's symptoms such as apathy, depression, incontinence/constipation, sleep behavior disorders, communication and cognitive impairment. LCSW provided supportive counseling as pt endorses stress in regards to her own dx of Parkinson's, as well as caregiver stress as pt's husband has a Parkinsonism diagnosis which has impaired functioning.  LCSW provided pt with information about our support and educational groups for patients with Parkinson's as well as their care partners, also discussed the importance of forced intense exercise in the management of PD and provided information about exercise opportunities.  Also discussed availability of individual counseling sessions to address the experience of caregiver stress and living with chronic illness, invited patient to schedule with LCSW as desired. Pt responded receptively to patient education today. Pt would likely benefit from engagement with Parkinson's support groups as well as consideration for individual counseling.    Brief Interventions provided today in session 1. psychoeducation, patient education 2. Supportive counseling   Plan 1. Goal for increased exercise- consider online cycling class 2. Attend Parkinson's educational and social groups.  3. Consider individual OPT as means to manage stress of chronic illness & caregiver stress  Behavioral Health treatment recommendations communicated to referring provider and pt states agreement with plan.  LCSW will remain available for future consultation.

## 2019-10-22 DIAGNOSIS — I340 Nonrheumatic mitral (valve) insufficiency: Secondary | ICD-10-CM | POA: Diagnosis not present

## 2019-10-22 DIAGNOSIS — G459 Transient cerebral ischemic attack, unspecified: Secondary | ICD-10-CM | POA: Diagnosis not present

## 2019-10-22 DIAGNOSIS — I10 Essential (primary) hypertension: Secondary | ICD-10-CM | POA: Diagnosis not present

## 2019-10-22 DIAGNOSIS — K219 Gastro-esophageal reflux disease without esophagitis: Secondary | ICD-10-CM | POA: Diagnosis not present

## 2019-10-22 DIAGNOSIS — G20 Parkinson's disease: Secondary | ICD-10-CM | POA: Diagnosis not present

## 2019-10-22 DIAGNOSIS — Z780 Asymptomatic menopausal state: Secondary | ICD-10-CM | POA: Diagnosis not present

## 2019-10-22 DIAGNOSIS — M545 Low back pain: Secondary | ICD-10-CM | POA: Diagnosis not present

## 2019-10-22 DIAGNOSIS — I351 Nonrheumatic aortic (valve) insufficiency: Secondary | ICD-10-CM | POA: Diagnosis not present

## 2019-10-26 DIAGNOSIS — M1712 Unilateral primary osteoarthritis, left knee: Secondary | ICD-10-CM | POA: Diagnosis not present

## 2019-10-26 DIAGNOSIS — M25552 Pain in left hip: Secondary | ICD-10-CM | POA: Diagnosis not present

## 2019-10-26 DIAGNOSIS — M7062 Trochanteric bursitis, left hip: Secondary | ICD-10-CM | POA: Diagnosis not present

## 2019-11-08 ENCOUNTER — Other Ambulatory Visit: Payer: Self-pay

## 2019-11-08 ENCOUNTER — Ambulatory Visit (INDEPENDENT_AMBULATORY_CARE_PROVIDER_SITE_OTHER): Payer: Medicare PPO

## 2019-11-08 DIAGNOSIS — T63441D Toxic effect of venom of bees, accidental (unintentional), subsequent encounter: Secondary | ICD-10-CM | POA: Diagnosis not present

## 2019-11-09 ENCOUNTER — Ambulatory Visit (INDEPENDENT_AMBULATORY_CARE_PROVIDER_SITE_OTHER): Payer: Medicare PPO | Admitting: Neurology

## 2019-11-09 ENCOUNTER — Other Ambulatory Visit: Payer: Self-pay

## 2019-11-09 ENCOUNTER — Telehealth: Payer: Self-pay

## 2019-11-09 DIAGNOSIS — G20 Parkinson's disease: Secondary | ICD-10-CM | POA: Diagnosis not present

## 2019-11-09 DIAGNOSIS — K117 Disturbances of salivary secretion: Secondary | ICD-10-CM | POA: Diagnosis not present

## 2019-11-09 DIAGNOSIS — M5417 Radiculopathy, lumbosacral region: Secondary | ICD-10-CM

## 2019-11-09 DIAGNOSIS — G43109 Migraine with aura, not intractable, without status migrainosus: Secondary | ICD-10-CM | POA: Diagnosis not present

## 2019-11-09 NOTE — Telephone Encounter (Signed)
Patient aware of results and recommendations.  MRI orderd.

## 2019-11-09 NOTE — Procedures (Signed)
[facility]  [address], [address]  [place], [place] [address] Tel: [phone] Fax:  [phone] Test Date:  11/09/2019  Patient: Linda Lee DOB: 09-Aug-1935 Physician: Narda Amber, DO  Sex: Female Height: 5\' 5"  Ref Phys: Alonza Bogus, D.O.  ID#: [id number] Temp: 31.0C Technician:    Patient Complaints: This is a 84 year old female with history of lumbar surgery referred for evaluation of left foot pain and numbness.  NCV & EMG Findings: Extensive electrodiagnostic testing of the left lower extremity and additional studies of the right shows:  1. Left sural and superficial peroneal sensory responses are within normal limits. 2. Left peroneal motor response at the extensor digitorum brevis shows reduced amplitude (1.8 mV), and is normal at the tibialis anterior.  Left tibial motor responses within normal limits 3. Left tibial H reflex study is within normal limits. 4. Chronic motor axonal loss changes are seen affecting the left L5 myotome, without accompanied active denervation.  These findings are not present in the right lower extremity.  Impression: 1. Chronic L5 radiculopathy affecting the left lower extremity, moderate. 2. There is no evidence of a sensorimotor polyneuropathy affecting the left lower extremity.   ___________________________ Narda Amber, DO    Nerve Conduction Studies Anti Sensory Summary Table   Stim Site NR Peak (ms) Norm Peak (ms) P-T Amp (V) Norm P-T Amp  Left Sup Peroneal Anti Sensory (Ant Lat Mall)  31C  12 cm    1.8 <4.6 6.2 >3  Left Sural Anti Sensory (Lat Mall)  31C  Calf    3.3 <4.6 13.8 >3   Motor Summary Table   Stim Site NR Onset (ms) Norm Onset (ms) O-P Amp (mV) Norm O-P Amp Site1 Site2 Delta-0 (ms) Dist (cm) Vel (m/s) Norm Vel (m/s)  Left Peroneal Motor (Ext Dig Brev)  31C  Ankle    4.8 <6.0 1.8 >2.5 B Fib Ankle 7.7 36.0 47 >40  B Fib    12.5  1.4  Poplt B Fib 1.6 9.0 56 >40  Poplt    14.1  1.3         Left  Peroneal TA Motor (Tib Ant)  31C  Fib Head    2.8 <4.5 5.9 >3 Poplit Fib Head 1.5 8.0 53 >40  Poplit    4.3  5.8         Left Tibial Motor (Abd Hall Brev)  31C  Ankle    3.2 <6.0 12.5 >4 Knee Ankle 8.7 39.0 45 >40  Knee    11.9  7.5          H Reflex Studies   NR H-Lat (ms) Lat Norm (ms) L-R H-Lat (ms)  Left Tibial (Gastroc)  31C     29.93 <35    EMG   Side Muscle Ins Act Fibs Psw Fasc Number Recrt Dur Dur. Amp Amp. Poly Poly. Comment  Left AntTibialis Nml Nml Nml Nml 1- Rapid Many 1+ Many 1+ Many 1+ N/A  Left Gastroc Nml Nml Nml Nml Nml Nml Nml Nml Nml Nml Nml Nml N/A  Left Flex Dig Long Nml Nml Nml Nml 1- Rapid Many 1+ Many 1+ Many 1+ N/A  Left RectFemoris Nml Nml Nml Nml Nml Nml Nml Nml Nml Nml Nml Nml N/A  Left GluteusMed Nml Nml Nml Nml 1- Rapid Some 1+ Some 1+ Some 1+ N/A  Left BicepsFemS Nml Nml Nml Nml Nml Nml Nml Nml Nml Nml Nml Nml N/A  Right AntTibialis Nml Nml Nml Nml Nml Nml  Nml Nml Nml Nml Nml Nml N/A  Right Ext Dig Long Nml Nml Nml Nml Nml Nml Nml Nml Nml Nml Nml Nml N/A  Right GluteusMed Nml Nml Nml Nml Nml Nml Nml Nml Nml Nml Nml Nml N/A      Waveforms:

## 2019-11-09 NOTE — Telephone Encounter (Signed)
-----   Message from Octaviano Batty Tat, DO sent at 11/09/2019  1:38 PM EDT ----- Let pt know that EMG demonstrated pinched nerve from the back at the L5 level.  If agreeable, order MRI lumbar spine (without contrast unless she has had prior back surgery).  Dx:  lumbar radiculopathy.

## 2019-11-11 DIAGNOSIS — M25562 Pain in left knee: Secondary | ICD-10-CM | POA: Insufficient documentation

## 2019-11-18 ENCOUNTER — Telehealth: Payer: Self-pay

## 2019-11-18 NOTE — Telephone Encounter (Signed)
-----   Message from April Harrington, [place] sent at 11/15/2019 11:24 AM EDT ----- Regarding: CT order Pt states that AK was supposed to put in order for CT chest. There is an order from another provider and an old one from [place]. Please contact pt.

## 2019-11-18 NOTE — Telephone Encounter (Signed)
Spoke with Dr. Jacinto Halim, he said ok to use the order he placed and just let patient know that she can just let the other order from the other provider expire. Let patient know.

## 2019-11-19 ENCOUNTER — Other Ambulatory Visit: Payer: Self-pay | Admitting: Cardiology

## 2019-11-19 DIAGNOSIS — M25562 Pain in left knee: Secondary | ICD-10-CM | POA: Diagnosis not present

## 2019-11-24 DIAGNOSIS — M25562 Pain in left knee: Secondary | ICD-10-CM | POA: Diagnosis not present

## 2019-11-25 ENCOUNTER — Telehealth: Payer: Self-pay

## 2019-11-25 ENCOUNTER — Other Ambulatory Visit: Payer: Self-pay | Admitting: Cardiology

## 2019-11-25 DIAGNOSIS — I712 Thoracic aortic aneurysm, without rupture, unspecified: Secondary | ICD-10-CM

## 2019-11-25 NOTE — Telephone Encounter (Signed)
Done

## 2019-11-29 ENCOUNTER — Ambulatory Visit: Payer: Medicare PPO | Admitting: Podiatry

## 2019-12-03 ENCOUNTER — Other Ambulatory Visit: Payer: Self-pay

## 2019-12-03 ENCOUNTER — Ambulatory Visit
Admission: RE | Admit: 2019-12-03 | Discharge: 2019-12-03 | Disposition: A | Discharge status: Home / Self Care | Payer: Medicare PPO | Source: Ambulatory Visit | Attending: Neurology | Admitting: Neurology

## 2019-12-03 DIAGNOSIS — M5417 Radiculopathy, lumbosacral region: Secondary | ICD-10-CM

## 2019-12-03 DIAGNOSIS — M5136 Other intervertebral disc degeneration, lumbar region: Secondary | ICD-10-CM | POA: Diagnosis not present

## 2019-12-03 DIAGNOSIS — M48061 Spinal stenosis, lumbar region without neurogenic claudication: Secondary | ICD-10-CM | POA: Diagnosis not present

## 2019-12-03 MED ORDER — GADOBENATE DIMEGLUMINE 529 MG/ML IV SOLN
14.0000 mL | Freq: Once | INTRAVENOUS | Status: AC | PRN
  Administered 2019-12-03: 14 mL via INTRAVENOUS

## 2019-12-04 ENCOUNTER — Emergency Department (HOSPITAL_BASED_OUTPATIENT_CLINIC_OR_DEPARTMENT_OTHER)
Admission: EM | Admit: 2019-12-04 | Discharge: 2019-12-04 | Disposition: A | Discharge status: Home / Self Care | Payer: Medicare PPO | Attending: Emergency Medicine | Admitting: Emergency Medicine

## 2019-12-04 ENCOUNTER — Encounter (HOSPITAL_BASED_OUTPATIENT_CLINIC_OR_DEPARTMENT_OTHER): Payer: Self-pay | Admitting: Emergency Medicine

## 2019-12-04 ENCOUNTER — Other Ambulatory Visit: Payer: Self-pay

## 2019-12-04 ENCOUNTER — Emergency Department (HOSPITAL_BASED_OUTPATIENT_CLINIC_OR_DEPARTMENT_OTHER): Payer: Medicare PPO

## 2019-12-04 DIAGNOSIS — Z8673 Personal history of transient ischemic attack (TIA), and cerebral infarction without residual deficits: Secondary | ICD-10-CM | POA: Insufficient documentation

## 2019-12-04 DIAGNOSIS — Y92520 Airport as the place of occurrence of the external cause: Secondary | ICD-10-CM | POA: Diagnosis not present

## 2019-12-04 DIAGNOSIS — Z79899 Other long term (current) drug therapy: Secondary | ICD-10-CM | POA: Diagnosis not present

## 2019-12-04 DIAGNOSIS — M79642 Pain in left hand: Secondary | ICD-10-CM | POA: Diagnosis not present

## 2019-12-04 DIAGNOSIS — W010XXA Fall on same level from slipping, tripping and stumbling without subsequent striking against object, initial encounter: Secondary | ICD-10-CM | POA: Insufficient documentation

## 2019-12-04 DIAGNOSIS — Y9301 Activity, walking, marching and hiking: Secondary | ICD-10-CM | POA: Diagnosis not present

## 2019-12-04 DIAGNOSIS — S8002XA Contusion of left knee, initial encounter: Secondary | ICD-10-CM | POA: Insufficient documentation

## 2019-12-04 DIAGNOSIS — Y998 Other external cause status: Secondary | ICD-10-CM | POA: Diagnosis not present

## 2019-12-04 DIAGNOSIS — Z87891 Personal history of nicotine dependence: Secondary | ICD-10-CM | POA: Insufficient documentation

## 2019-12-04 DIAGNOSIS — Z7982 Long term (current) use of aspirin: Secondary | ICD-10-CM | POA: Diagnosis not present

## 2019-12-04 DIAGNOSIS — W19XXXA Unspecified fall, initial encounter: Secondary | ICD-10-CM

## 2019-12-04 DIAGNOSIS — S0083XA Contusion of other part of head, initial encounter: Secondary | ICD-10-CM | POA: Insufficient documentation

## 2019-12-04 DIAGNOSIS — Z96651 Presence of right artificial knee joint: Secondary | ICD-10-CM | POA: Insufficient documentation

## 2019-12-04 DIAGNOSIS — S63502A Unspecified sprain of left wrist, initial encounter: Secondary | ICD-10-CM | POA: Diagnosis not present

## 2019-12-04 DIAGNOSIS — S0990XA Unspecified injury of head, initial encounter: Secondary | ICD-10-CM | POA: Diagnosis present

## 2019-12-04 DIAGNOSIS — S6992XA Unspecified injury of left wrist, hand and finger(s), initial encounter: Secondary | ICD-10-CM | POA: Diagnosis not present

## 2019-12-04 NOTE — ED Provider Notes (Signed)
[facility] EMERGENCY DEPARTMENT Provider Note   CSN: [id number] Arrival date & time: 12/04/19  4580     History Chief Complaint  Patient presents with  . Fall    Linda Lee is a 84 y.o. female.  84 year old female presents with complaint of left hand pain after fall 2 days ago.  Patient states [place] and fell landing on outstretched hand, did hit her left cheek on the ground as well.  Denies feeling weak, dizzy, lightheaded prior to her fall, no loss of consciousness with the fall, not on blood thinners.  Patient states originally she does have pain in her left cheek area, noticed the next day that her left hand was painful.  Also notes contusion to her left knee.  Patient states that she is scheduled to see Dr. Antony Odea for her left knee pain, is ambulatory without difficulty.  Plans to follow-up with Dr. Amanda Pea for her left hand injury.  No other injuries, complaints, concerns.        Past Medical History:  Diagnosis Date  . Anginal pain (HCC)   . Angio-edema   . Arthritis   . Atypical chest pain 07/06/2009   Qualifier: Diagnosis of  By: Clifton James, MD, Cristal Deer    . GERD (gastroesophageal reflux disease)   . Hyperlipemia   . Migraines   . Toxic effect of venom of bees, unintentional 06/03/2017    Patient Active Problem List   Diagnosis Date Noted  . Nonsustained ventricular tachycardia (HCC) 10/06/2018  . Migraine 10/06/2018  . History of TIA (transient ischemic attack) 10/05/2018  . Coronary artery calcification seen on CT scan 10/05/2018  . Parkinson's disease (HCC) 12/10/2017  . Carpal tunnel syndrome of left wrist 12/08/2017  . Ulnar neuropathy 12/08/2017  . Dyspnea on exertion 06/17/2017  . OA (osteoarthritis) of knee 09/25/2015  . UTI (lower urinary tract infection) 09/04/2012  . Diastolic CHF, chronic (HCC) 09/04/2012  . Subjective visual disturbance, right eye 09/03/2012  . Vision  disturbance 09/03/2012  . Ascending aortic aneurysm (HCC) 07/06/2009  . Mixed hyperlipidemia 06/30/2009  . GERD 06/30/2009  . LEG CRAMPS 06/30/2009    Past Surgical History:  Procedure Laterality Date  . ADENOIDECTOMY    . BACK SURGERY    . HEMORRHOID SURGERY    . RIGHT/LEFT HEART CATH AND CORONARY ANGIOGRAPHY N/A 06/17/2017   Procedure: RIGHT/LEFT HEART CATH AND CORONARY ANGIOGRAPHY;  Surgeon: Yates Decamp, MD;  Location: [facility] INVASIVE CV LAB;  Service: Cardiovascular;  Laterality: N/A;  . 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     denies surgery for this  . TONSILLECTOMY    . TOTAL KNEE ARTHROPLASTY Right 09/25/2015   Procedure: TOTAL KNEE ARTHROPLASTY;  Surgeon: Ollen Gross, MD;  Location: [facility];  Service: Orthopedics;  Laterality: Right;     OB History   No obstetric history on file.     Family History  Problem Relation Age of Onset  . Prostate cancer Father   . Healthy Daughter   . Epilepsy Son   . Healthy Son   . Allergic rhinitis Neg Hx   . Angioedema Neg Hx   . Asthma Neg Hx   . Eczema Neg Hx   . Immunodeficiency Neg Hx   . Urticaria Neg Hx     Social History   Tobacco Use  . Smoking status: Former Smoker    Types: Cigarettes    Quit date: 08/15/1963    Years since quitting: 56.3  .  Smokeless tobacco: Never Used  Substance Use Topics  . Alcohol use: Yes    Alcohol/week: 1.0 standard drinks    Types: 1 Glasses of wine per week    Comment: q hs  . Drug use: No    Home Medications Prior to Admission medications   Medication Sig Start Date End Date Taking? Authorizing Provider  aspirin EC 325 MG tablet Take 1 tablet (325 mg total) by mouth daily. 08/11/18   Mesner, Barbara Cower, MD  atorvastatin (LIPITOR) 20 MG tablet Take 1 tablet (20 mg total) by mouth daily after supper. 08/11/18   Mesner, Barbara Cower, MD  carbidopa-levodopa (SINEMET IR) 25-100 MG tablet Take 1.5 tablets by mouth 3 (three) times daily. 10/11/19   Tat, Octaviano Batty, DO  co-enzyme Q-10 30 MG capsule Take 30 mg by  mouth daily.     [provider]  CRANBERRY PO Take 300 mg daily by mouth.    [provider]  Cyanocobalamin (VITAMIN B-12 PO) Take 1,200 mcg daily by mouth.     [provider]  EPINEPHrine 0.3 mg/0.3 mL IJ SOAJ injection Inject 0.3 mLs (0.3 mg total) into the muscle once as needed (anaphylaxis). 04/01/17   Charlynne Pander, MD  Ergocalciferol (VITAMIN D2) 2000 units TABS Take 2,000 Units daily by mouth.     [provider]  estradiol (ESTRACE) 0.1 MG/GM vaginal cream Place 1 Applicatorful vaginally 2 (two) times a week.     [provider]  furosemide (LASIX) 20 MG tablet Take 20 mg by mouth. Take 1 tablet  M,W,F    [provider]  KLOR-CON M10 10 MEQ tablet Take 1 tablet on M/W/F 05/11/19   [provider]  omeprazole (PRILOSEC) 40 MG capsule Take 40 mg by mouth continuous as needed.  06/15/18   [provider]  propranolol ER (INDERAL LA) 80 MG 24 hr capsule TAKE 1 CAPSULE(80 MG) BY MOUTH DAILY 11/19/19   Yates Decamp, MD  saccharomyces boulardii (FLORASTOR) 250 MG capsule Take 250 mg by mouth daily.     [provider]    Allergies    Ciprofloxacin, Wasp venom, and Fluconazole  Review of Systems   Review of Systems  Constitutional: Negative for fever.  Eyes: Negative for visual disturbance.  Musculoskeletal: Positive for arthralgias, joint swelling and myalgias. Negative for back pain, gait problem and neck pain.  Skin: Positive for wound.  Allergic/Immunologic: Negative for immunocompromised state.  Neurological: Negative for dizziness, weakness, numbness and headaches.  Hematological: Does not bruise/bleed easily.  Psychiatric/Behavioral: Negative for confusion.    Physical Exam Updated Vital Signs BP 140/67 (BP Location: Right Arm)   Pulse (!) 58   Temp 97.8 F (36.6 C) (Oral)   Resp 18   Ht 5\' 4"  (1.626 m)   Wt 72.6 kg   SpO2 99%   BMI 27.46 kg/m   Physical Exam Vitals and nursing note  reviewed.  Constitutional:      General: She is not in acute distress.    Appearance: She is well-developed. She is not diaphoretic.  HENT:     Head: Normocephalic.   Eyes:     Extraocular Movements: Extraocular movements intact.     Pupils: Pupils are equal, round, and reactive to light.  Cardiovascular:     Pulses: Normal pulses.  Pulmonary:     Effort: Pulmonary effort is normal.  Musculoskeletal:        General: Swelling and tenderness present. No deformity. Normal range of motion.  Left wrist: Swelling, tenderness and snuff box tenderness present. No deformity, lacerations or crepitus. Normal range of motion. Normal pulse.     Left knee: Ecchymosis present. No swelling or bony tenderness. Normal range of motion. No tenderness.     Comments: Tenderness to left thumb as well as snuff box, ecchymosis to distal left thumb. Mild tenderness distal radius/ulna. No deformity, no limits in ROM. No elbow pain/tenderness. Normal capillary refill and sensation. Left knee- mild ecchymosis to anterior knee, able to straight leg raise, no patella tendon tenderness, no joint line tenderness, no tenderness across patella.   Skin:    General: Skin is warm and dry.     Findings: Bruising present. No erythema or rash.  Neurological:     Mental Status: She is alert and oriented to person, place, and time.  Psychiatric:        Behavior: Behavior normal.     ED Results / Procedures / Treatments   Labs (all labs ordered are listed, but only abnormal results are displayed) Labs Reviewed - No data to display  EKG None  Radiology MR Lumbar Spine W Wo Contrast  Result Date: 12/04/2019 CLINICAL DATA:  Low back pain radiating to the left lower extremity EXAM: MRI LUMBAR SPINE WITHOUT AND WITH CONTRAST TECHNIQUE: Multiplanar and multiecho pulse sequences of the lumbar spine were obtained without and with intravenous contrast. CONTRAST:  2mL MULTIHANCE GADOBENATE DIMEGLUMINE 529 MG/ML IV SOLN  COMPARISON:  09/23/2006 FINDINGS: Segmentation:  Standard Alignment:  Grade 1 retrolisthesis at L3-4 Vertebrae:  No fracture, evidence of discitis, or bone lesion. Conus medullaris and cauda equina: Conus extends to the L1 level. Conus and cauda equina appear normal. Paraspinal and other soft tissues: Negative Disc levels: T11-12: Imaged only in the sagittal plane. Normal. T12-L1: Small right subarticular disc protrusion. No spinal canal or neural foraminal stenosis. L1-L2: Small disc bulge. Normal facets. Slightly progressed mild spinal canal stenosis. No neural foraminal stenosis. L2-L3: Small disc bulge . slightly worsened mild spinal canal stenosis. No neural foraminal stenosis. L3-L4: Moderate facet hypertrophy and small disc bulge, worsened from the prior study. Severe spinal canal stenosis. Moderate left neural foraminal stenosis. L4-L5: Disc space narrowing. Status post left laminectomy. There is no spinal canal stenosis. Moderate bilateral neural foraminal stenosis. L5-S1: Moderate facet hypertrophy. Mild disc bulge. Mild spinal canal stenosis. No neural foraminal stenosis. Visualized sacrum: Normal. No abnormal contrast enhancement. IMPRESSION: 1. Progression of degenerative disc disease at L3-4 where there is severe spinal canal stenosis and moderate left neural foraminal stenosis. 2. Mild spinal canal stenosis at L1-2 and L2-3 has progressed from the prior study. Unchanged mild L5-S1 canal stenosis. 3. Moderate bilateral L4-5 neural foraminal stenosis. Electronically Signed   By: Ulyses Jarred M.D.   On: 12/04/2019 00:54   DG Hand Complete Left  Result Date: 12/04/2019 CLINICAL DATA:  Fall 2 days ago with left hand/wrist pain. EXAM: LEFT HAND - COMPLETE 3+ VIEW COMPARISON:  None. FINDINGS: Examination demonstrates mild degenerate change of the radiocarpal joint with moderate degenerative change over the scaphoid trapezium joint and first carpometacarpal joints. Mild degenerate changes over the  interphalangeal joints. No evidence of bony erosions. No fracture or dislocation. IMPRESSION: No acute findings. Degenerative changes as described most prominent at the first MCP joint. Electronically Signed   By: Marin Olp M.D.   On: 12/04/2019 10:35    Procedures Procedures (including critical care time)  Medications Ordered in ED Medications - No data to display  ED Course  I have  reviewed the triage vital signs and the nursing notes.  Pertinent labs & imaging results that were available during my care of the patient were reviewed by me and considered in my medical decision making (see chart for details).  Clinical Course as of Dec 04 1103  Sat Dec 04, 2019  1055 84 yo female here with mechanical fall 2 days ago, complaining of pain in her hand/thumb, which began yesterday (not immediately after her fall).  She has ttp ina antomic snuffbox.  Otherwise neurovascularly intact.  Also has small bruise to knee and forehead, but I think this was a low-impact fall from standing.  No headache.  Don't think she needs CT.  Xray with no acute fx.  Plan for thumb spica, f/u with pcp or ortho for repeat films if she is still tender in a week.   [MT]  1103 Patient plans to follow up with ortho for her knee, will plan to follow up with Dr. Amanda Pea for her left hand/anatomic snuff box tenderness. Will place in thumb spica splint. Recommend ice/elevate, tylenol. Offered Norco, patient declines.    [LM]    Clinical Course User Index [LM] Jeannie Fend, PA-C [MT] Terald Sleeper, MD   MDM Rules/Calculators/A&P                      Final Clinical Impression(s) / ED Diagnoses Final diagnoses:  Fall, initial encounter  Contusion of face, initial encounter  Sprain of left wrist, initial encounter  Contusion of left knee, initial encounter    Rx / DC Orders ED Discharge Orders    None       Jeannie Fend, PA-C 12/04/19 1105    Terald Sleeper, MD 12/04/19 1635

## 2019-12-04 NOTE — Discharge Instructions (Signed)
Wear splint except to bathe. Take Tylenol as needed as directed for pain. Apply ice for 20 minutes and elevate knee and wrist. Apply ice/cold compress to face to help with swelling/bruising.

## 2019-12-04 NOTE — ED Triage Notes (Signed)
States," I fell in the [place] airport on Thursday" Larey Seat over a curb and hit concrete . Pain to left hand and abrasions to left side of face

## 2019-12-06 ENCOUNTER — Other Ambulatory Visit: Payer: Self-pay

## 2019-12-06 DIAGNOSIS — M48062 Spinal stenosis, lumbar region with neurogenic claudication: Secondary | ICD-10-CM

## 2019-12-09 ENCOUNTER — Telehealth: Payer: Self-pay

## 2019-12-09 DIAGNOSIS — M25562 Pain in left knee: Secondary | ICD-10-CM | POA: Diagnosis not present

## 2019-12-09 DIAGNOSIS — M1712 Unilateral primary osteoarthritis, left knee: Secondary | ICD-10-CM | POA: Diagnosis not present

## 2019-12-09 NOTE — Telephone Encounter (Signed)
Received fax stating patient has an appt at [facility] with Dr Rush Farmer office on January 09, 2020 at 3:30pm.

## 2019-12-10 ENCOUNTER — Other Ambulatory Visit: Payer: Medicare PPO

## 2019-12-13 ENCOUNTER — Other Ambulatory Visit: Payer: Self-pay

## 2019-12-13 ENCOUNTER — Ambulatory Visit (INDEPENDENT_AMBULATORY_CARE_PROVIDER_SITE_OTHER): Payer: Medicare PPO

## 2019-12-13 DIAGNOSIS — T63441D Toxic effect of venom of bees, accidental (unintentional), subsequent encounter: Secondary | ICD-10-CM

## 2019-12-15 ENCOUNTER — Other Ambulatory Visit: Payer: Self-pay

## 2019-12-15 ENCOUNTER — Ambulatory Visit
Admission: RE | Admit: 2019-12-15 | Discharge: 2019-12-15 | Disposition: A | Discharge status: Home / Self Care | Payer: Medicare PPO | Source: Ambulatory Visit | Attending: Cardiology | Admitting: Cardiology

## 2019-12-15 DIAGNOSIS — I712 Thoracic aortic aneurysm, without rupture, unspecified: Secondary | ICD-10-CM

## 2019-12-15 MED ORDER — IOPAMIDOL (ISOVUE-370) INJECTION 76%
75.0000 mL | Freq: Once | INTRAVENOUS | Status: AC | PRN
  Administered 2019-12-15: 75 mL via INTRAVENOUS

## 2019-12-15 NOTE — Progress Notes (Signed)
Aortic aneurysm is stable. Ill defined density in the pancreas need to be further evaluated and will inform Dr. Rodrigo Ran.

## 2019-12-20 ENCOUNTER — Other Ambulatory Visit: Payer: Self-pay | Admitting: Internal Medicine

## 2019-12-23 DIAGNOSIS — M25532 Pain in left wrist: Secondary | ICD-10-CM | POA: Diagnosis not present

## 2019-12-23 DIAGNOSIS — M79641 Pain in right hand: Secondary | ICD-10-CM | POA: Insufficient documentation

## 2019-12-23 DIAGNOSIS — M79642 Pain in left hand: Secondary | ICD-10-CM | POA: Diagnosis not present

## 2019-12-30 ENCOUNTER — Other Ambulatory Visit: Payer: Self-pay | Admitting: Internal Medicine

## 2019-12-30 DIAGNOSIS — K869 Disease of pancreas, unspecified: Secondary | ICD-10-CM

## 2019-12-31 ENCOUNTER — Other Ambulatory Visit: Payer: Self-pay | Admitting: Internal Medicine

## 2020-01-10 ENCOUNTER — Ambulatory Visit (INDEPENDENT_AMBULATORY_CARE_PROVIDER_SITE_OTHER): Payer: Medicare PPO

## 2020-01-10 ENCOUNTER — Other Ambulatory Visit: Payer: Self-pay

## 2020-01-10 DIAGNOSIS — T63441D Toxic effect of venom of bees, accidental (unintentional), subsequent encounter: Secondary | ICD-10-CM

## 2020-01-12 ENCOUNTER — Other Ambulatory Visit: Payer: Self-pay | Admitting: Internal Medicine

## 2020-01-12 DIAGNOSIS — M5136 Other intervertebral disc degeneration, lumbar region: Secondary | ICD-10-CM | POA: Diagnosis not present

## 2020-01-12 DIAGNOSIS — M48062 Spinal stenosis, lumbar region with neurogenic claudication: Secondary | ICD-10-CM | POA: Diagnosis not present

## 2020-01-12 DIAGNOSIS — M47816 Spondylosis without myelopathy or radiculopathy, lumbar region: Secondary | ICD-10-CM | POA: Diagnosis not present

## 2020-01-12 DIAGNOSIS — K869 Disease of pancreas, unspecified: Secondary | ICD-10-CM

## 2020-01-12 DIAGNOSIS — I10 Essential (primary) hypertension: Secondary | ICD-10-CM | POA: Diagnosis not present

## 2020-01-15 ENCOUNTER — Ambulatory Visit (HOSPITAL_BASED_OUTPATIENT_CLINIC_OR_DEPARTMENT_OTHER)
Admission: RE | Admit: 2020-01-15 | Discharge: 2020-01-15 | Disposition: A | Discharge status: Home / Self Care | Payer: Medicare PPO | Source: Ambulatory Visit | Attending: Internal Medicine | Admitting: Internal Medicine

## 2020-01-15 ENCOUNTER — Other Ambulatory Visit: Payer: Self-pay

## 2020-01-15 DIAGNOSIS — K5730 Diverticulosis of large intestine without perforation or abscess without bleeding: Secondary | ICD-10-CM | POA: Diagnosis not present

## 2020-01-15 DIAGNOSIS — K7689 Other specified diseases of liver: Secondary | ICD-10-CM | POA: Diagnosis not present

## 2020-01-15 DIAGNOSIS — K869 Disease of pancreas, unspecified: Secondary | ICD-10-CM | POA: Insufficient documentation

## 2020-01-15 MED ORDER — GADOBUTROL 1 MMOL/ML IV SOLN
7.2000 mL | Freq: Once | INTRAVENOUS | Status: AC | PRN
  Administered 2020-01-15: 7.2 mL via INTRAVENOUS

## 2020-01-17 DIAGNOSIS — Z1231 Encounter for screening mammogram for malignant neoplasm of breast: Secondary | ICD-10-CM | POA: Diagnosis not present

## 2020-01-26 DIAGNOSIS — M25562 Pain in left knee: Secondary | ICD-10-CM | POA: Diagnosis not present

## 2020-01-26 DIAGNOSIS — M1712 Unilateral primary osteoarthritis, left knee: Secondary | ICD-10-CM | POA: Diagnosis not present

## 2020-02-08 ENCOUNTER — Ambulatory Visit: Payer: Self-pay

## 2020-02-21 ENCOUNTER — Other Ambulatory Visit: Payer: Self-pay

## 2020-02-21 ENCOUNTER — Ambulatory Visit (INDEPENDENT_AMBULATORY_CARE_PROVIDER_SITE_OTHER): Payer: Medicare PPO

## 2020-02-21 DIAGNOSIS — T63441D Toxic effect of venom of bees, accidental (unintentional), subsequent encounter: Secondary | ICD-10-CM | POA: Diagnosis not present

## 2020-03-16 DIAGNOSIS — M1712 Unilateral primary osteoarthritis, left knee: Secondary | ICD-10-CM | POA: Diagnosis not present

## 2020-03-16 DIAGNOSIS — M25562 Pain in left knee: Secondary | ICD-10-CM | POA: Diagnosis not present

## 2020-03-24 DIAGNOSIS — M1712 Unilateral primary osteoarthritis, left knee: Secondary | ICD-10-CM | POA: Diagnosis not present

## 2020-03-24 DIAGNOSIS — M25562 Pain in left knee: Secondary | ICD-10-CM | POA: Diagnosis not present

## 2020-03-27 NOTE — Progress Notes (Signed)
Assessment/Plan:   1.  Parkinsons Disease   -Continue carbidopa/levodopa 25/100, 1.5 tablets 3 times per day  -We discussed that it used to be thought that levodopa would increase risk of melanoma but now it is believed that Parkinsons itself likely increases risk of melanoma. she is to get regular skin checks.  She does follow regularly with dermatology  -Talked about the importance of safe, cardiovascular exercise  2.  History of expressive aphasia, likely representing complicated migraine  -Had extensive negative work-up.  -On aspirin and Lipitor.  3.  Sialorrhea  -Mild and requires no treatment.  Discussed botox but wants to hold on that.   4.  LBP  -Patient's MRI demonstrated severe spinal canal stenosis at L3-L4.  She was subsequently referred to Dr. Venetia Maxon and she is scheduled tomorrow for intralaminar injection.  5.  GERD  -to discuss with PCP  -discuss raising HOB, not eating peppermints Subjective:   Linda Lee was seen today in follow up for Parkinsons disease.  My previous records were reviewed prior to todays visit as well as outside records available to me. [place] fall at airport since [place].  She went to the emergency room for that fall in May.  Those records were reviewed.  Pt denies lightheadedness, near syncope.  No hallucinations.  Mood has been good.  Patient did have EMG completed since last visit, demonstrating chronic L5 radiculopathy on the left.  MRI lumbar spine was subsequently ordered, demonstrating severe spinal canal stenosis at L3-L4 and moderate bilateral L4-L5 neural foraminal stenosis.  There was mild central canal stenosis at L5-S1.  Patient was subsequently referred to Dr. Venetia Maxon.  She saw Dr. Venetia Maxon on June 9.  I called today to get those notes and she was referred for intralaminar block.  That is scheduled for tomorrow.  Having reflux.  Stress increasing with caregiving for husband.    Current prescribed movement disorder  medications: Carbidopa/levodopa 25/100, 1.5 tablets 3 times per day (increased last visit)   PREVIOUS MEDICATIONS: Sinemet  ALLERGIES:   Allergies  Allergen Reactions  . Ciprofloxacin     Can not take due to hx of Cdiff per patient  . Wasp Venom Other (See Comments)    dizziness  . Fluconazole Rash    CURRENT MEDICATIONS:  Outpatient Encounter Medications as of 03/28/2020  Medication Sig  . aspirin 81 MG EC tablet Take 81 mg by mouth daily. Swallow whole.  Marland Kitchen atorvastatin (LIPITOR) 20 MG tablet Take 1 tablet (20 mg total) by mouth daily after supper.  . carbidopa-levodopa (SINEMET IR) 25-100 MG tablet Take 1.5 tablets by mouth 3 (three) times daily.  Marland Kitchen co-enzyme Q-10 30 MG capsule Take 30 mg by mouth daily.   Marland Kitchen CRANBERRY PO Take 300 mg daily by mouth.  . Cyanocobalamin (VITAMIN B-12 PO) Take 1,200 mcg daily by mouth.   . EPINEPHrine 0.3 mg/0.3 mL IJ SOAJ injection Inject 0.3 mLs (0.3 mg total) into the muscle once as needed (anaphylaxis).  . Ergocalciferol (VITAMIN D2) 2000 units TABS Take 2,000 Units daily by mouth.   . estradiol (ESTRACE) 0.1 MG/GM vaginal cream Place 1 Applicatorful vaginally once a week.   . furosemide (LASIX) 20 MG tablet Take 20 mg by mouth. Take 1 tablet  M,W,F  . KLOR-CON M10 10 MEQ tablet Take 1 tablet on M/W/F  . omeprazole (PRILOSEC) 40 MG capsule Take 40 mg by mouth continuous as needed.   . propranolol ER (INDERAL LA) 80 MG 24 hr capsule  TAKE 1 CAPSULE(80 MG) BY MOUTH DAILY  . saccharomyces boulardii (FLORASTOR) 250 MG capsule Take 250 mg by mouth daily.   . [DISCONTINUED] aspirin EC 325 MG tablet Take 1 tablet (325 mg total) by mouth daily. (Patient not taking: Reported on 03/28/2020)   No facility-administered encounter medications on file as of 03/28/2020.    Objective:   PHYSICAL EXAMINATION:    VITALS:   Vitals:   03/28/20 1403  BP: 135/79  Pulse: 74  SpO2: 98%  Weight: 161 lb (73 kg)  Height: 5' 4.5" (1.638 m)   Wt Readings from Last  3 Encounters:  03/28/20 161 lb (73 kg)  12/04/19 160 lb (72.6 kg)  10/11/19 166 lb (75.3 kg)     GEN:  The patient appears stated age and is in NAD. HEENT:  Normocephalic, atraumatic.  The mucous membranes are moist. The superficial temporal arteries are without ropiness or tenderness. CV:  RRR Lungs:  CTAB Neck/HEME:  There are no carotid bruits bilaterally.  Neurological examination:  Orientation: The patient is alert and oriented x3. Cranial nerves: There is good facial symmetry without facial hypomimia. The speech is fluent and clear. Soft palate rises symmetrically and there is no tongue deviation. Hearing is intact to conversational tone. Sensation: Sensation is intact to light touch throughout Motor: Strength is at least antigravity x4.  Movement examination: Tone: There is normal tone in the UE/LE Abnormal movements: rare RUE rest tremor Coordination:  There is no decremation with RAM's, with any form of RAMS, including alternating supination and pronation of the forearm, hand opening and closing, finger taps, heel taps and toe taps. Gait and Station: The patient has no difficulty arising out of a deep-seated chair without the use of the hands. The patient's stride length is good.    I have reviewed and interpreted the following labs independently    Chemistry      Component Value Date/Time   NA 142 08/11/2018 1108   K 3.9 08/11/2018 1108   CL 107 08/11/2018 1108   CO2 23 08/11/2018 1039   BUN 20 08/11/2018 1108   CREATININE 0.90 08/11/2018 1108      Component Value Date/Time   CALCIUM 9.0 08/11/2018 1039   ALKPHOS 89 08/11/2018 1039   AST 15 08/11/2018 1039   ALT <5 08/11/2018 1039   BILITOT 0.8 08/11/2018 1039       Lab Results  Component Value Date   WBC 4.3 08/11/2018   HGB 13.6 08/11/2018   HCT 40.0 08/11/2018   MCV 95.4 08/11/2018   PLT 157 08/11/2018     Total time spent on today's visit was 30 minutes, including both face-to-face time and  nonface-to-face time.  Time included that spent on review of records (prior notes available to me/labs/imaging if pertinent), discussing treatment and goals, answering patient's questions and coordinating care.  Cc:  Rodrigo Ran, MD

## 2020-03-28 ENCOUNTER — Other Ambulatory Visit: Payer: Self-pay

## 2020-03-28 ENCOUNTER — Encounter: Payer: Self-pay | Admitting: Neurology

## 2020-03-28 ENCOUNTER — Ambulatory Visit: Payer: Medicare PPO | Admitting: Neurology

## 2020-03-28 VITALS — BP 135/79 | HR 74 | Ht 64.5 in | Wt 161.0 lb

## 2020-03-28 DIAGNOSIS — G20 Parkinson's disease: Secondary | ICD-10-CM

## 2020-03-28 NOTE — Patient Instructions (Signed)
Exercise!  Your Parkinsons Disease symptoms look well controlled.    Call me if you decide you want to try the botox for drooling.  The physicians and staff at [facility] are committed to providing excellent care. You may receive a survey requesting feedback about your experience at our office. We strive to receive "very good" responses to the survey questions. If you feel that your experience would prevent you from giving the office a "very good " response, please contact our office to try to remedy the situation. We may be reached at [phone]. Thank you for taking the time out of your busy day to complete the survey.

## 2020-03-30 DIAGNOSIS — M5416 Radiculopathy, lumbar region: Secondary | ICD-10-CM | POA: Diagnosis not present

## 2020-03-30 DIAGNOSIS — M48062 Spinal stenosis, lumbar region with neurogenic claudication: Secondary | ICD-10-CM | POA: Diagnosis not present

## 2020-03-31 DIAGNOSIS — M1712 Unilateral primary osteoarthritis, left knee: Secondary | ICD-10-CM | POA: Diagnosis not present

## 2020-04-03 ENCOUNTER — Ambulatory Visit: Payer: Self-pay

## 2020-04-05 ENCOUNTER — Ambulatory Visit (INDEPENDENT_AMBULATORY_CARE_PROVIDER_SITE_OTHER): Payer: Medicare PPO

## 2020-04-05 ENCOUNTER — Other Ambulatory Visit: Payer: Self-pay

## 2020-04-05 DIAGNOSIS — T63441D Toxic effect of venom of bees, accidental (unintentional), subsequent encounter: Secondary | ICD-10-CM | POA: Diagnosis not present

## 2020-04-14 DIAGNOSIS — M859 Disorder of bone density and structure, unspecified: Secondary | ICD-10-CM | POA: Diagnosis not present

## 2020-04-14 DIAGNOSIS — E785 Hyperlipidemia, unspecified: Secondary | ICD-10-CM | POA: Diagnosis not present

## 2020-04-16 ENCOUNTER — Other Ambulatory Visit: Payer: Self-pay | Admitting: Neurology

## 2020-04-21 DIAGNOSIS — G629 Polyneuropathy, unspecified: Secondary | ICD-10-CM | POA: Diagnosis not present

## 2020-04-21 DIAGNOSIS — G459 Transient cerebral ischemic attack, unspecified: Secondary | ICD-10-CM | POA: Diagnosis not present

## 2020-04-21 DIAGNOSIS — R82998 Other abnormal findings in urine: Secondary | ICD-10-CM | POA: Diagnosis not present

## 2020-04-21 DIAGNOSIS — Z1331 Encounter for screening for depression: Secondary | ICD-10-CM | POA: Diagnosis not present

## 2020-04-21 DIAGNOSIS — E785 Hyperlipidemia, unspecified: Secondary | ICD-10-CM | POA: Diagnosis not present

## 2020-04-21 DIAGNOSIS — D259 Leiomyoma of uterus, unspecified: Secondary | ICD-10-CM | POA: Diagnosis not present

## 2020-04-21 DIAGNOSIS — I340 Nonrheumatic mitral (valve) insufficiency: Secondary | ICD-10-CM | POA: Diagnosis not present

## 2020-04-21 DIAGNOSIS — I10 Essential (primary) hypertension: Secondary | ICD-10-CM | POA: Diagnosis not present

## 2020-04-21 DIAGNOSIS — I77810 Thoracic aortic ectasia: Secondary | ICD-10-CM | POA: Diagnosis not present

## 2020-04-21 DIAGNOSIS — Z0000 Encounter for general adult medical examination without abnormal findings: Secondary | ICD-10-CM | POA: Diagnosis not present

## 2020-04-21 DIAGNOSIS — G20 Parkinson's disease: Secondary | ICD-10-CM | POA: Diagnosis not present

## 2020-04-24 DIAGNOSIS — M47816 Spondylosis without myelopathy or radiculopathy, lumbar region: Secondary | ICD-10-CM | POA: Diagnosis not present

## 2020-04-24 DIAGNOSIS — M5136 Other intervertebral disc degeneration, lumbar region: Secondary | ICD-10-CM | POA: Diagnosis not present

## 2020-04-24 DIAGNOSIS — M48062 Spinal stenosis, lumbar region with neurogenic claudication: Secondary | ICD-10-CM | POA: Diagnosis not present

## 2020-04-24 DIAGNOSIS — M5416 Radiculopathy, lumbar region: Secondary | ICD-10-CM | POA: Diagnosis not present

## 2020-05-12 DIAGNOSIS — M25562 Pain in left knee: Secondary | ICD-10-CM | POA: Diagnosis not present

## 2020-05-12 DIAGNOSIS — M25552 Pain in left hip: Secondary | ICD-10-CM | POA: Diagnosis not present

## 2020-05-15 ENCOUNTER — Ambulatory Visit (INDEPENDENT_AMBULATORY_CARE_PROVIDER_SITE_OTHER): Payer: Medicare PPO

## 2020-05-15 ENCOUNTER — Other Ambulatory Visit: Payer: Self-pay

## 2020-05-15 DIAGNOSIS — T63441D Toxic effect of venom of bees, accidental (unintentional), subsequent encounter: Secondary | ICD-10-CM | POA: Diagnosis not present

## 2020-05-17 ENCOUNTER — Ambulatory Visit: Payer: Self-pay

## 2020-05-26 DIAGNOSIS — D225 Melanocytic nevi of trunk: Secondary | ICD-10-CM | POA: Diagnosis not present

## 2020-05-26 DIAGNOSIS — L570 Actinic keratosis: Secondary | ICD-10-CM | POA: Diagnosis not present

## 2020-05-26 DIAGNOSIS — L820 Inflamed seborrheic keratosis: Secondary | ICD-10-CM | POA: Diagnosis not present

## 2020-05-26 DIAGNOSIS — Z85828 Personal history of other malignant neoplasm of skin: Secondary | ICD-10-CM | POA: Diagnosis not present

## 2020-05-26 DIAGNOSIS — D2271 Melanocytic nevi of right lower limb, including hip: Secondary | ICD-10-CM | POA: Diagnosis not present

## 2020-05-26 DIAGNOSIS — L821 Other seborrheic keratosis: Secondary | ICD-10-CM | POA: Diagnosis not present

## 2020-05-26 DIAGNOSIS — D2272 Melanocytic nevi of left lower limb, including hip: Secondary | ICD-10-CM | POA: Diagnosis not present

## 2020-06-26 ENCOUNTER — Other Ambulatory Visit: Payer: Self-pay

## 2020-06-26 ENCOUNTER — Ambulatory Visit: Payer: Self-pay

## 2020-06-26 ENCOUNTER — Ambulatory Visit (INDEPENDENT_AMBULATORY_CARE_PROVIDER_SITE_OTHER): Payer: Medicare PPO

## 2020-06-26 DIAGNOSIS — T63441D Toxic effect of venom of bees, accidental (unintentional), subsequent encounter: Secondary | ICD-10-CM

## 2020-07-06 DIAGNOSIS — M5416 Radiculopathy, lumbar region: Secondary | ICD-10-CM | POA: Diagnosis not present

## 2020-07-31 ENCOUNTER — Other Ambulatory Visit: Payer: Self-pay | Admitting: Neurology

## 2020-08-02 DIAGNOSIS — Z961 Presence of intraocular lens: Secondary | ICD-10-CM | POA: Diagnosis not present

## 2020-08-02 DIAGNOSIS — H524 Presbyopia: Secondary | ICD-10-CM | POA: Diagnosis not present

## 2020-08-04 DIAGNOSIS — Z20822 Contact with and (suspected) exposure to covid-19: Secondary | ICD-10-CM | POA: Diagnosis not present

## 2020-08-07 ENCOUNTER — Ambulatory Visit: Payer: Self-pay

## 2020-08-09 ENCOUNTER — Ambulatory Visit (INDEPENDENT_AMBULATORY_CARE_PROVIDER_SITE_OTHER): Payer: Medicare PPO | Admitting: *Deleted

## 2020-08-09 DIAGNOSIS — T63441D Toxic effect of venom of bees, accidental (unintentional), subsequent encounter: Secondary | ICD-10-CM | POA: Diagnosis not present

## 2020-09-06 DIAGNOSIS — M47816 Spondylosis without myelopathy or radiculopathy, lumbar region: Secondary | ICD-10-CM | POA: Diagnosis not present

## 2020-09-06 DIAGNOSIS — M48062 Spinal stenosis, lumbar region with neurogenic claudication: Secondary | ICD-10-CM | POA: Diagnosis not present

## 2020-09-06 DIAGNOSIS — M5136 Other intervertebral disc degeneration, lumbar region: Secondary | ICD-10-CM | POA: Diagnosis not present

## 2020-09-06 DIAGNOSIS — M5416 Radiculopathy, lumbar region: Secondary | ICD-10-CM | POA: Diagnosis not present

## 2020-09-18 ENCOUNTER — Ambulatory Visit (INDEPENDENT_AMBULATORY_CARE_PROVIDER_SITE_OTHER): Payer: Medicare PPO

## 2020-09-18 ENCOUNTER — Other Ambulatory Visit: Payer: Self-pay

## 2020-09-18 DIAGNOSIS — T63441D Toxic effect of venom of bees, accidental (unintentional), subsequent encounter: Secondary | ICD-10-CM | POA: Diagnosis not present

## 2020-09-20 NOTE — Progress Notes (Signed)
Assessment/Plan:   1.  Parkinsons Disease  -Continue carbidopa/levodopa 25/100, 1.5 tablets 3 times per day  -exercise  -We discussed that it used to be thought that levodopa would increase risk of melanoma but now it is believed that Parkinsons itself likely increases risk of melanoma. she is to get regular skin checks.   2.  Severe spinal canal stenosis at L3-L4  -Follows with Dr. Venetia Maxon  3.  History of expressive aphasia, likely representing complicated migraine  -On aspirin and Lipitor.  Extensive negative work-up.  4.  Sialorrhea  -This is commonly associated with PD.  We talked about treatments.  The patient is not a candidate for oral anticholinergic therapy because of increased risk of confusion and falls.  We discussed Botox (type A and B) and 1% atropine drops.  We discusssed that candy like lemon drops can help by stimulating mm of the oropharynx to induce swallowing.  She is not interested right now   Subjective:   Linda Lee was seen today in follow up for Parkinsons disease.  My previous records were reviewed prior to todays visit as well as outside records available to me. Pt with caregiving struggles and is going to put her husband into skilled care.  Not exercising.Pt denies falls.  Pt denies lightheadedness, near syncope.  No hallucinations.  Mood has been good.  She has seen dr. Venetia Maxon and had one shot that helped and is getting another next week.    Current prescribed movement disorder medications: Carbidopa/levodopa 25/100, 1.5 tablets 3 times per day    ALLERGIES:   Allergies  Allergen Reactions  . Ciprofloxacin     Can not take due to hx of Cdiff per patient  . Wasp Venom Other (See Comments)    dizziness  . Fluconazole Rash    CURRENT MEDICATIONS:  Outpatient Encounter Medications as of 09/22/2020  Medication Sig  . aspirin 81 MG EC tablet Take 81 mg by mouth daily. Swallow whole.  Marland Kitchen atorvastatin (LIPITOR) 20 MG tablet Take 1 tablet (20 mg  total) by mouth daily after supper.  . carbidopa-levodopa (SINEMET IR) 25-100 MG tablet TAKE 1 AND 1/2 TABLETS BY MOUTH THREE TIMES DAILY  . CRANBERRY PO Take 300 mg daily by mouth.  . Cyanocobalamin (VITAMIN B-12 PO) Take 1,200 mcg daily by mouth.   . Ergocalciferol (VITAMIN D2) 2000 units TABS Take 2,000 Units daily by mouth.   . estradiol (ESTRACE) 0.1 MG/GM vaginal cream Place 1 Applicatorful vaginally once a week.   . furosemide (LASIX) 20 MG tablet Take 20 mg by mouth. Take 1 tablet  M,W,F  . KLOR-CON M10 10 MEQ tablet Take 1 tablet on M/W/F  . omeprazole (PRILOSEC) 40 MG capsule Take 40 mg by mouth continuous as needed.   . propranolol ER (INDERAL LA) 80 MG 24 hr capsule TAKE 1 CAPSULE(80 MG) BY MOUTH DAILY  . saccharomyces boulardii (FLORASTOR) 250 MG capsule Take 250 mg by mouth daily.   Marland Kitchen co-enzyme Q-10 30 MG capsule Take 30 mg by mouth daily.  (Patient not taking: Reported on 09/22/2020)  . EPINEPHrine 0.3 mg/0.3 mL IJ SOAJ injection Inject 0.3 mLs (0.3 mg total) into the muscle once as needed (anaphylaxis). (Patient not taking: Reported on 09/22/2020)   No facility-administered encounter medications on file as of 09/22/2020.    Objective:   PHYSICAL EXAMINATION:    VITALS:   Vitals:   09/22/20 1026  BP: 130/72  Pulse: 97  Resp: 18  SpO2: 98%  Weight:  151 lb (68.5 kg)  Height: 5' 4.5" (1.638 m)    GEN:  The patient appears stated age and is in NAD. HEENT:  Normocephalic, atraumatic.  The mucous membranes are moist. The superficial temporal arteries are without ropiness or tenderness. CV:  RRR Lungs:  CTAB Neck/HEME:  There are no carotid bruits bilaterally.  Neurological examination:  Orientation: The patient is alert and oriented x3. Cranial nerves: There is good facial symmetry with min facial hypomimia. The speech is fluent and clear. Soft palate rises symmetrically and there is no tongue deviation. Hearing is intact to conversational tone. Sensation: Sensation is  intact to light touch throughout Motor: Strength is at least antigravity x4.  Movement examination: Tone: There is nl tone in the UE/LE Abnormal movements: rare tremor on the R Coordination:  There is  decremation with RAM's, only with toe taps on the L Gait and Station: The patient has no difficulty arising out of a deep-seated chair without the use of the hands. The patient's stride length is decreased slightly .     I have reviewed and interpreted the following labs independently    Chemistry      Component Value Date/Time   NA 142 08/11/2018 1108   K 3.9 08/11/2018 1108   CL 107 08/11/2018 1108   CO2 23 08/11/2018 1039   BUN 20 08/11/2018 1108   CREATININE 0.90 08/11/2018 1108      Component Value Date/Time   CALCIUM 9.0 08/11/2018 1039   ALKPHOS 89 08/11/2018 1039   AST 15 08/11/2018 1039   ALT <5 08/11/2018 1039   BILITOT 0.8 08/11/2018 1039       Lab Results  Component Value Date   WBC 4.3 08/11/2018   HGB 13.6 08/11/2018   HCT 40.0 08/11/2018   MCV 95.4 08/11/2018   PLT 157 08/11/2018      Total time spent on today's visit was 20 minutes, including both face-to-face time and nonface-to-face time.  Time included that spent on review of records (prior notes available to me/labs/imaging if pertinent), discussing treatment and goals, answering patient's questions and coordinating care.  Cc:  Rodrigo Ran, MD

## 2020-09-22 ENCOUNTER — Other Ambulatory Visit: Payer: Self-pay

## 2020-09-22 ENCOUNTER — Ambulatory Visit: Payer: Medicare PPO | Admitting: Neurology

## 2020-09-22 ENCOUNTER — Encounter: Payer: Self-pay | Admitting: Neurology

## 2020-09-22 ENCOUNTER — Ambulatory Visit: Payer: Self-pay | Admitting: Allergy and Immunology

## 2020-09-22 VITALS — BP 130/72 | HR 97 | Resp 18 | Ht 64.5 in | Wt 151.0 lb

## 2020-09-22 DIAGNOSIS — G20 Parkinson's disease: Secondary | ICD-10-CM

## 2020-09-22 DIAGNOSIS — K117 Disturbances of salivary secretion: Secondary | ICD-10-CM | POA: Diagnosis not present

## 2020-09-22 NOTE — Patient Instructions (Signed)
Online Resources for Power over Parkinson's Group February 2022  . Local [place] Online Groups  o Power over Parkinson's Group :   - Power Over Parkinson's Patient Education Group will be Wednesday, February 9th at 2pm via Zoom.   - Upcoming Power over Parkinson's Meetings:  2nd Wednesdays of the month at 2 pm:       March 9th, April 13th - Contact Amy Marriott at [email or web address] if interested in participating in this online group o Parkinson's Care Partners Group:    3rd Mondays, Contact Sarah Chambers o Atypical Parkinsonian Patient Group:   4th Wednesdays, Contact Sarah Chambers o If you are interested in participating in these online groups with Sarah, please contact her directly for how to join those meetings.  Her contact information is [email or web address].  She will send you a link to join the Zoom meeting.  (Please note that Sarah Chambers , MSW, LCSW, has resigned her position at [employer], but will continue to lead the online groups temporarily)  . Parkinson Foundation:  www.parkinson.org o PD Health at Home continues:  Mindfulness Mondays, Expert Briefing Tuesdays, Wellness Wednesdays, Take Time Thursdays, Fitness Fridays -Listings for February 2022 are on the website o Upcoming Webinar:  Sights, Sounds, and Parkinson's.  Wednesday, February 2 @ 1 pm o Upcoming Webinar:  Conversations about Complementary Therapies and PD.  Wednesday, March 2 @ 1 pm o Register for expert briefings (webinars) at [email or web address] o  Please check out their website to sign up for emails and see their full online offerings  . Michael J [employer] Foundation:  www.michaeljfox.org  o Upcoming Webinar:   Moving with Mood Changes in Aging and Parkinson's:  A Look at Depression and Anxiety.  (This is a replay of a webinar originally aired June 2020)  Thursday, February 17 @ 12 noon o Check out additional information on their website to see their full online  offerings  . Davis Phinney Foundation:  www.davisphinneyfoundation.org o Upcoming Webinar:  The [place] Virtual Event.  The Parkinson's You Don't See:  When your Autonomic System goes Off-Track.   Friday, February 18th.  Go to [email or web address], then click on Events, [place] Virtual Event Series tab to register. o Care Partner Monthly Meetup.  With Connie Carpenter Phinney.  First Tuesday of each month, 2 pm o Check out additional information to Live Well Today on their website  . Parkinson and Movement Disorders (PMD) Alliance:  www.pmdalliance.org o [employer]:  Online Education Events o Sign up for emails, which are sent weekly to give you updates on programming and online offerings . Parkinson's Association of [place]:  www.parkinsonassociation.org o Information on online support groups, education events, and online exercises including Yoga, Parkinson's exercises and more-LOTS of information on links to PD resources and online events o Virtual Support Group through Parkinson's Association of [place]; next one is scheduled for Wednesday, October 04, 2020 at 2 pm. (These are typically scheduled for the 1st Wednesday of the month at 2 pm).  Visit website for details.  . Additional links for movement activities: o PWR! Moves Classes at [facility] HAD RESUMED (but are ON HOLD DUE TO COVID in February)  Contact Amy Marriott, PT [email or web address] or [phone] if interested o Here is a link to the PWR!Moves classes on Zoom from [place] Parkinson's Foundation - Daily Mon-Sat at 10:00. Via Zoom, FREE and open to all.  There is also a link below via Facebook if you use that platform. - https://www.[email or web address].org/mpf-programs/exercise-and-movement-activities - https://www.facebook.com/ParkinsonsMI.org/posts/pwr-moves-exercise-class-parkinson-wellness-recovery-online-with-angee-ludwa-pt-/10156827878021813/ o   Parkinson's Wellness  Recovery (PWR! Moves)  www.pwr4life.org - Info on the PWR! Virtual Experience:  You will have access to our expertise through self-assessment, guided plans that start with the PD-specific fundamentals, educational content, tips, Q&A with an expert, and a growing library of PD-specific pre-recorded and live exercise classes of varying types and intensity - both physical and cognitive! If that is not enough, we offer 1:1 wellness consultations (in-person or virtual) to personalize your PWR! Virtual Experience.  - Check out the PWR! Move of the month on the Parkinson Wellness Recovery website:  https://www.pwr4life.org/pwr-move-of-the-month-4/ o Parkinson Foundation Fitness Fridays:  - As part of the PD Health @ Home program, this free video series focuses each week on one aspect of fitness designed to support people living with Parkinson's.  These weekly videos highlight the Parkinson Foundation recent fitness guidelines for people with Parkinson's disease. -  www.parkinson.org/understanding-parkinsons/coronavirus/PD-health-at-home/Fitness-Fridays o Dance for PD website is offering free, live-stream classes throughout the week, as well as links to digital library of classes:  https://danceforparkinsons.org/ o Dance for Parkinson's Class:  [employer] [place].  Free offering for people with Parkinson's and care partners; virtual class.  o For more information, contact [phone] or email Magalli Morana at [email or web address] o Virtual dance and Pilates for Parkinson's classes: Click on the Community Tab> Parkinson's Movement Initiative Tab.  To register for classes and for more information, visit www.americandancefestival.org and click the "community" tab.    o [employer] Parkinson's Cycling Classes  - [facility] [employer]: 1pm on Fridays-Live classes at [employer] (Contact Beth McKinney at [email or web address] or [phone]) - [facility] [employer]: Virtual Classes Mondays and Thursdays (contact  Priscilla at [email or web address] or [phone])   o [place] [employer] - Three levels of classes are offered Tuesdays and Thursdays:  10:30 am,  12 noon & 1:45 pm at [facility] [facility]. To observe a class or for  more information, call [phone] or email [email or web address] - PD Flow Yoga for Parkinson's:  Fridays 1-2 pm, January 7-September 29, 2020 . [employer]: o Online Caregiver Education Opportunities:  www.well-springsolutions.org/caregiver-education/caregiver-support-group.  You may also contact Jodi Kolada at [email or web address] or [phone].   o Virtual Caregiver Retreat, Monday, February 21st, 3-5 pm o Powerful Tools for Caregivers, 6-wk series for caregivers, planning to be in person, starting March 10th o [facility] Navigator:  Just1Navigator program, a free service to help individuals and families through the journey of determining care for older adults.  The "Navigator" is a social worker, Nicole Reynolds, who will speak with a prospective client and/or loved ones to provide an assessment of the situation and a set of recommendations for a personalized care plan -- all free of charge, and whether [facility] [employer] offers the needed service or not. If the need is not a service we provide, we are well-connected with reputable programs in town that we can refer you to.  www.well-springsolutions.org or to speak with the Navigator, call [phone].  

## 2020-10-02 DIAGNOSIS — M5416 Radiculopathy, lumbar region: Secondary | ICD-10-CM | POA: Diagnosis not present

## 2020-10-05 ENCOUNTER — Encounter: Payer: Self-pay | Admitting: Cardiology

## 2020-10-05 ENCOUNTER — Other Ambulatory Visit: Payer: Self-pay

## 2020-10-05 ENCOUNTER — Ambulatory Visit: Payer: Medicare PPO | Admitting: Cardiology

## 2020-10-05 VITALS — BP 138/78 | HR 55 | Temp 97.2°F | Resp 16 | Ht 64.5 in | Wt 152.0 lb

## 2020-10-05 DIAGNOSIS — Z8673 Personal history of transient ischemic attack (TIA), and cerebral infarction without residual deficits: Secondary | ICD-10-CM | POA: Diagnosis not present

## 2020-10-05 DIAGNOSIS — E7800 Pure hypercholesterolemia, unspecified: Secondary | ICD-10-CM | POA: Diagnosis not present

## 2020-10-05 DIAGNOSIS — I2510 Atherosclerotic heart disease of native coronary artery without angina pectoris: Secondary | ICD-10-CM | POA: Diagnosis not present

## 2020-10-05 DIAGNOSIS — I712 Thoracic aortic aneurysm, without rupture, unspecified: Secondary | ICD-10-CM

## 2020-10-05 NOTE — Progress Notes (Signed)
Primary Physician/Referring:  Crist Infante, MD  Patient ID: Zachery Conch, female    DOB: 1936/01/06, 85 y.o.   MRN: [id number]  Chief Complaint  Patient presents with  . Hyperlipidemia  . Follow-up    1 Year   HPI:    SARABETH BENTON  is a 85 y.o. with Parkinson's disease, hyperlipidemia, 4.5 cm ascending thoracicaortic aneurysm, complex migraines, and coronary calcification on CT scan. She has had episodes of trouble talking and difficulty expressing herself and headache afterwards. Differential diagnosis is TIA vs complicated migraine. Last episode was in August 2020. She has had negative echocardiogram and event monitor in 2020.  She is followed by Dr. Carles Collet.  She was last seen 1 year ago, has not had repeat CTA of the chest at that time and aortic aneurysm has remained stable.  Overall she feels that she is doing well. Has chronic dyspnea on exertion has been stable.  She also has history of Parkinson's that has been stable.  She is under stress today as she placed her husband John at [facility] long-term care facility due to advanced Parkinson's and memory deficits.  Past Medical History:  Diagnosis Date  . Anginal pain ([place])   . Angio-edema   . Arthritis   . Atypical chest pain 07/06/2009   Qualifier: Diagnosis of  By: Angelena Form, MD, Harrell Gave    . GERD (gastroesophageal reflux disease)   . Hyperlipemia   . Migraines   . Toxic effect of venom of bees, unintentional 06/03/2017   Past Surgical History:  Procedure Laterality Date  . ADENOIDECTOMY    . BACK SURGERY    . HEMORRHOID SURGERY    . RIGHT/LEFT HEART CATH AND CORONARY ANGIOGRAPHY N/A 06/17/2017   Procedure: RIGHT/LEFT HEART CATH AND CORONARY ANGIOGRAPHY;  Surgeon: Adrian Prows, MD;  Location: [facility];  Service: Cardiovascular;  Laterality: N/A;  . Thoracicc Aortic Tomasa Hose 07/2009     denies surgery for this  . TONSILLECTOMY    . TOTAL KNEE ARTHROPLASTY Right 09/25/2015   Procedure: TOTAL  KNEE ARTHROPLASTY;  Surgeon: Gaynelle Arabian, MD;  Location: [facility];  Service: Orthopedics;  Laterality: Right;   Social History   Tobacco Use  . Smoking status: Former Smoker    Types: Cigarettes    Quit date: 08/15/1963    Years since quitting: 85.1  . Smokeless tobacco: Never Used  Substance Use Topics  . Alcohol use: Yes    Alcohol/week: 1.0 standard drink    Types: 1 Glasses of wine per week    Comment: q hs    ROS  Review of Systems  Cardiovascular: Positive for dyspnea on exertion (chronic, stable). Negative for chest pain, claudication, leg swelling, orthopnea, palpitations and syncope.  Neurological: Positive for tremors (Parkinsons). Negative for dizziness, focal weakness and headaches.  All other systems reviewed and are negative.  Objective  Blood pressure 138/78, pulse (!) 55, temperature (!) 97.2 F (36.2 C), temperature source Temporal, resp. rate 16, height 5' 4.5" (1.638 m), weight 152 lb (68.9 kg), SpO2 99 %.  Vitals with BMI 10/05/2020 09/22/2020 03/28/2020  Height 5' 4.5" 5' 4.5" 5' 4.5"  Weight 152 lbs 151 lbs 161 lbs  BMI 25.7 48.88 91.69  Systolic 450 388 828  Diastolic 78 72 79  Pulse 55 97 74     Physical Exam Vitals reviewed.  Constitutional:      Appearance: She is well-developed.  Cardiovascular:     Rate and Rhythm: Normal rate and regular rhythm.  Pulses: Intact distal pulses.     Heart sounds: Normal heart sounds.  Pulmonary:     Effort: Pulmonary effort is normal. No accessory muscle usage or respiratory distress.     Breath sounds: Normal breath sounds.  Neurological:     Mental Status: She is alert and oriented to person, place, and time.    Laboratory examination:   External labs:   Cholesterol, total 148.000 m 04/14/2020 HDL 57.000 mg 04/14/2020 LDL 81.000 mg 04/14/2020 Triglycerides 48.000 mg 04/14/2020  A1C 5.200 % 08/11/2018 TSH 2.050 04/14/2020   Hemoglobin 12.800 g/d 04/06/2019  Creatinine, Serum 0.800 mg/ 04/06/2019 EGFR  >60 Potassium 4.200 mEq 04/14/2020 ALT (SGPT) 12.000 IU/ 04/14/2020    Cholesterol, total 133.000 m 04/06/2019 HDL 44 MG/DL 04/06/2019 LDL 80.000 mg 04/06/2019 Triglycerides 46.000 04/06/2019  A1C 5.200 08/11/2018 TSH 2.530 micr 04/06/2019  Glucose Random N 04/13/2019 MicroAlbumin Urine 25.000 04/14/2019 MicroAlbumin/Creat 16.7 MG/ 04/14/2019  BUN 24.000 mg 04/06/2019 Creatinine, Serum 0.800 mg/ 04/06/2019   Medications and allergies   Allergies  Allergen Reactions  . Ciprofloxacin     Can not take due to hx of Cdiff per patient  . Wasp Venom Other (See Comments)    dizziness  . Fluconazole Rash    Current Outpatient Medications on File Prior to Visit  Medication Sig Dispense Refill  . aspirin 81 MG EC tablet Take 81 mg by mouth daily. Swallow whole.    Marland Kitchen atorvastatin (LIPITOR) 20 MG tablet Take 1 tablet (20 mg total) by mouth daily after supper. 30 tablet 0  . carbidopa-levodopa (SINEMET IR) 25-100 MG tablet TAKE 1 AND 1/2 TABLETS BY MOUTH THREE TIMES DAILY 405 tablet 1  . CRANBERRY PO Take 300 mg daily by mouth.    . Cyanocobalamin (VITAMIN B-12 PO) Take 1,200 mcg daily by mouth.     . EPINEPHrine 0.3 mg/0.3 mL IJ SOAJ injection Inject 0.3 mLs (0.3 mg total) into the muscle once as needed (anaphylaxis). 2 Device 0  . Ergocalciferol (VITAMIN D2) 2000 units TABS Take 2,000 Units daily by mouth.     . estradiol (ESTRACE) 0.1 MG/GM vaginal cream Place 1 Applicatorful vaginally once a week.     . furosemide (LASIX) 20 MG tablet Take 20 mg by mouth. Take 1 tablet  M,W,F    . KLOR-CON M10 10 MEQ tablet Take 1 tablet on M/W/F    . omeprazole (PRILOSEC) 40 MG capsule Take 40 mg by mouth continuous as needed.     . propranolol ER (INDERAL LA) 80 MG 24 hr capsule TAKE 1 CAPSULE(80 MG) BY MOUTH DAILY 90 capsule 3  . saccharomyces boulardii (FLORASTOR) 250 MG capsule Take 250 mg by mouth daily.      No current facility-administered medications on file prior to visit.     Radiology:   CT angiogram  chest 12/15/2019: 1. Stable uncomplicated mild fusiform aneurysmal dilatation of the ascending thoracic aorta measuring 43 mm in diameter unchanged compared to the 2010 examination. Aortic aneurysm NOS (ICD10-I71.9). 2. Questionable approximately 2.1 cm hypoattenuating lesion within the pancreatic head/neck junction, incompletely imaged on the present examination - further evaluation with either abdominal MRI (preferable if patient is able to cooperate with breathing instructions) or pancreatic protocol CT is recommended. 3. Stable cardiomegaly with mild enlargement of the caliber the main pulmonary artery. 4. Coronary calcifications.  Aortic Atherosclerosis (ICD10-I70.0).  MRI of the abdomen 01/12/2020: 1. Normal pancreas.  No pancreatic mass, see comments. 2. Numerous benign liver cysts. No suspicious liver masses. 3. No biliary  ductal dilatation. 4. Marked diffuse colonic diverticulosis.  Cardiac Studies:   30 day event monitor 01/22-02/20/2020: Dominant rhythm is normal sinus. 3 patient reported events without any symptoms correlated with sinus rhythm. 2 auto detected events of NSVT on 01/30 for 4 beats and 02/14 for 6 beats that were both asymptomatic. Lowest HR was 43 during sleep and fastest HR was 125 bpm. No A fib was noted.   Double contrast echocardiogram 08/26/2018: Normal LV systolic function, no intracardiac shunting noted. Limited study.  Right and left heart cath 06/17/2017: Normal right and left heart cath. RA 8/5/4, 71%, RV 28//3/7; PA 29/6/18, sat 72%; PW 13/10/8, Ao sat 99%. CO 4.8, CI 2.73. Qp:Qs 1.04.  CT angiogram chest 08/11/2018: No change from CT scan done previously on 08/18/2017: Comparison 05/10/2017: Aortic atherosclerosis, ascending aorta measures 4.5 cm, stable. Liver cysts.  08/10/2018: MRI brain and CTA neck normal  EKG:    EKG 10/05/2020: Sinus bradycardia at rate of 52 bpm otherwise normal EKG.   No significant change from EKG 10/06/2019: Sinus  bradycardia at 55 bpm.  Assessment     ICD-10-CM   1. Thoracic aortic aneurysm without rupture (HCC)  I71.2 EKG 12-Lead  2. Coronary artery calcification seen on CT scan  I25.10   3. History of TIA (transient ischemic attack)  Z86.73   4. Hypercholesteremia  E78.00 Lipid Panel With LDL/HDL Ratio    Lipid Panel With LDL/HDL Ratio   No orders of the defined types were placed in this encounter.  Medications Discontinued During This Encounter  Medication Reason  . co-enzyme Q-10 30 MG capsule Error    Orders Placed This Encounter  Procedures  . Lipid Panel With LDL/HDL Ratio    Standing Status:   Future    Number of Occurrences:   1    Standing Expiration Date:   10/05/2021  . EKG 12-Lead    Recommendations:   GIANINA OLINDE  is a 85 y.o. with Parkinson's disease, hyperlipidemia, 4.5 cm ascending thoracicaortic aneurysm, complex migraines, and coronary calcification on CT scan. She has had episodes difficulty expressing herself and headache afterwards. Differential diagnosis is TIA vs complicated migraine. Last episode was in August 2020. She has had negative echocardiogram and event monitor in 2020.  She is followed by Dr. Carles Collet.  Patient is overall doing well since last seen by [place] 1 year ago.  She is slightly depressed today as she placed her husband Jenny Reichmann into long-term memory care last week.  Otherwise no specific symptoms, she is presently doing well, blood pressure was slightly elevated and advised her to keep an eye on this and I do not want her blood pressure to be >537 mmHg systolic.  I also reviewed her external labs, LDL is 81, not at goal in view of coronary calcification would like to aim LDL <70, I will repeat lipid profile in 2 months and if LDL is still not at target, will consider either increasing Lipitor to 40 mg or adding Zetia.  We have decided to do aortic CT scans every other year as it has remained stable since 2019.  I will see her back in a year and obtain CT  angiogram prior to her next office visit.  No changes in the medications were done today.     Adrian Prows, MD, [facility] 10/05/2020, 5:23 PM Office: [phone] Pager: [phone]

## 2020-10-19 DIAGNOSIS — G459 Transient cerebral ischemic attack, unspecified: Secondary | ICD-10-CM | POA: Diagnosis not present

## 2020-10-19 DIAGNOSIS — I77810 Thoracic aortic ectasia: Secondary | ICD-10-CM | POA: Diagnosis not present

## 2020-10-19 DIAGNOSIS — G20 Parkinson's disease: Secondary | ICD-10-CM | POA: Diagnosis not present

## 2020-10-19 DIAGNOSIS — I10 Essential (primary) hypertension: Secondary | ICD-10-CM | POA: Diagnosis not present

## 2020-10-19 DIAGNOSIS — I340 Nonrheumatic mitral (valve) insufficiency: Secondary | ICD-10-CM | POA: Diagnosis not present

## 2020-10-19 DIAGNOSIS — G629 Polyneuropathy, unspecified: Secondary | ICD-10-CM | POA: Diagnosis not present

## 2020-10-19 DIAGNOSIS — K219 Gastro-esophageal reflux disease without esophagitis: Secondary | ICD-10-CM | POA: Diagnosis not present

## 2020-10-19 DIAGNOSIS — E785 Hyperlipidemia, unspecified: Secondary | ICD-10-CM | POA: Diagnosis not present

## 2020-10-19 DIAGNOSIS — M8580 Other specified disorders of bone density and structure, unspecified site: Secondary | ICD-10-CM | POA: Diagnosis not present

## 2020-10-29 ENCOUNTER — Other Ambulatory Visit: Payer: Self-pay | Admitting: Cardiology

## 2020-10-30 ENCOUNTER — Other Ambulatory Visit: Payer: Self-pay

## 2020-10-30 ENCOUNTER — Ambulatory Visit (INDEPENDENT_AMBULATORY_CARE_PROVIDER_SITE_OTHER): Payer: Medicare PPO

## 2020-10-30 DIAGNOSIS — T63441D Toxic effect of venom of bees, accidental (unintentional), subsequent encounter: Secondary | ICD-10-CM | POA: Diagnosis not present

## 2020-12-11 ENCOUNTER — Ambulatory Visit (INDEPENDENT_AMBULATORY_CARE_PROVIDER_SITE_OTHER): Payer: Medicare PPO

## 2020-12-11 ENCOUNTER — Other Ambulatory Visit: Payer: Self-pay

## 2020-12-11 DIAGNOSIS — T63441D Toxic effect of venom of bees, accidental (unintentional), subsequent encounter: Secondary | ICD-10-CM

## 2021-01-22 ENCOUNTER — Ambulatory Visit (INDEPENDENT_AMBULATORY_CARE_PROVIDER_SITE_OTHER): Payer: Medicare PPO

## 2021-01-22 ENCOUNTER — Other Ambulatory Visit: Payer: Self-pay

## 2021-01-22 DIAGNOSIS — E7800 Pure hypercholesterolemia, unspecified: Secondary | ICD-10-CM | POA: Diagnosis not present

## 2021-01-22 DIAGNOSIS — T63441D Toxic effect of venom of bees, accidental (unintentional), subsequent encounter: Secondary | ICD-10-CM

## 2021-01-22 DIAGNOSIS — Z1231 Encounter for screening mammogram for malignant neoplasm of breast: Secondary | ICD-10-CM | POA: Diagnosis not present

## 2021-01-23 LAB — LIPID PANEL WITH LDL/HDL RATIO
Cholesterol, Total: 157 mg/dL (ref 100–199)
HDL: 57 mg/dL (ref >39)
LDL Chol Calc (NIH): 89 mg/dL (ref 0–99)
LDL/HDL Ratio: 1.6 ratio (ref 0.0–3.2)
Triglycerides: 52 mg/dL (ref 0–149)
VLDL Cholesterol Cal: 11 mg/dL (ref 5–40)

## 2021-01-31 DIAGNOSIS — M25552 Pain in left hip: Secondary | ICD-10-CM | POA: Diagnosis not present

## 2021-01-31 DIAGNOSIS — M1712 Unilateral primary osteoarthritis, left knee: Secondary | ICD-10-CM | POA: Diagnosis not present

## 2021-01-31 DIAGNOSIS — M170 Bilateral primary osteoarthritis of knee: Secondary | ICD-10-CM | POA: Diagnosis not present

## 2021-02-01 ENCOUNTER — Ambulatory Visit: Payer: Medicare PPO | Admitting: Family

## 2021-02-06 NOTE — Patient Instructions (Signed)
Venom allergy Avoid stinging insects. In case of an allergic reaction, give Benadryl 4 teaspoonfuls every 4 hours, and if life-threatening symptoms occur, inject with EpiPen 0.3 mg. Continue venom immunotherapy every 6 weeks per protocol  Please let us know if this treatment plan is not working well for you Schedule a follow-up appointment in 1 year

## 2021-02-07 ENCOUNTER — Encounter: Payer: Self-pay | Admitting: Family

## 2021-02-07 ENCOUNTER — Ambulatory Visit: Payer: Medicare PPO | Admitting: Family

## 2021-02-07 ENCOUNTER — Other Ambulatory Visit: Payer: Self-pay

## 2021-02-07 VITALS — BP 136/68 | HR 58 | Temp 97.5°F | Resp 17 | Ht 64.0 in | Wt 156.8 lb

## 2021-02-07 DIAGNOSIS — T63441D Toxic effect of venom of bees, accidental (unintentional), subsequent encounter: Secondary | ICD-10-CM | POA: Diagnosis not present

## 2021-02-07 MED ORDER — EPINEPHRINE 0.3 MG/0.3ML IJ SOAJ: 0.3000 mg | Freq: Once | INTRAMUSCULAR | 1 refills | Status: AC | PRN

## 2021-02-07 NOTE — Progress Notes (Signed)
[address] [place] [address] Dept: [phone]  FOLLOW UP NOTE  Patient ID: Linda Lee, female    DOB: 03-Mar-1936  Age: 85 y.o. MRN: [id number] Date of Office Visit: 02/07/2021  Assessment  Chief Complaint: Follow-up ( Overall health well managed with venom injections.)  HPI Linda Lee is a 85 year old female who presents today for follow-up of toxic effect of venom of bees.  She was last seen on June 03, 2017 by Dr. Beaulah Dinning.  Since her last office visit she reports that she has been diagnosed with Parkinson's disease and is now on carbidopa levodopa.  Since her last office visit she reports that she has not had any bee stings and has not had any problems with her venom injections.  She reports that her EpiPen is expired and she would like a refill sent.   Drug Allergies:  Allergies  Allergen Reactions   Ciprofloxacin     Can not take due to hx of Cdiff per patient   Wasp Venom Other (See Comments)    dizziness   Fluconazole Rash    Review of Systems: Review of Systems  Constitutional:  Negative for chills and fever.  HENT:         Reports rhinorrhea only when wearing a mask otherwise denies rhinorrhea, nasal congestion, and postnasal drip  Eyes:        Denies itchy watery eyes  Respiratory:  Negative for cough, shortness of breath and wheezing.   Cardiovascular:  Negative for chest pain and palpitations.  Gastrointestinal:  Positive for heartburn.       Reports occasional heartburn and reflux for which she takes a medicine that she cannot remember the name  Genitourinary:  Negative for dysuria.  Skin:  Negative for itching and rash.  Neurological:  Positive for headaches.       Reports a history of complicated migraines    Physical Exam: BP 136/68 (BP Location: Right Arm, Patient Position: Sitting, Cuff Size: Normal)   Pulse (!) 58   Temp (!) 97.5 F (36.4 C) (Temporal)   Resp 17   Ht 5\' 4"  (1.626 m)   Wt 156 lb 12.8 oz (71.1 kg)    SpO2 98%   BMI 26.91 kg/m    Physical Exam Constitutional:      Appearance: Normal appearance.  HENT:     Head: Normocephalic and atraumatic.     Comments: Pharynx normal, eyes normal, ears normal, nose: Bilateral lower turbinate mildly edematous and slightly erythematous with clear drainage    Right Ear: Tympanic membrane, ear canal and external ear normal.     Left Ear: Tympanic membrane, ear canal and external ear normal.     Mouth/Throat:     Mouth: Mucous membranes are moist.     Pharynx: Oropharynx is clear.  Eyes:     Conjunctiva/sclera: Conjunctivae normal.  Cardiovascular:     Rate and Rhythm: Regular rhythm.     Heart sounds: Normal heart sounds.  Pulmonary:     Effort: Pulmonary effort is normal.     Breath sounds: Normal breath sounds.     Comments: Lungs clear to auscultation Musculoskeletal:     Cervical back: Neck supple.  Skin:    General: Skin is warm.  Neurological:     Mental Status: She is alert and oriented to person, place, and time.  Psychiatric:        Mood and Affect: Mood normal.        Behavior: Behavior normal.  Thought Content: Thought content normal.        Judgment: Judgment normal.    Diagnostics: None  Assessment and Plan: 1. Toxic effect of venom of bees, unintentional, subsequent encounter     Meds ordered this encounter  Medications   EPINEPHrine 0.3 mg/0.3 mL IJ SOAJ injection    Sig: Inject 0.3 mg into the muscle once as needed for up to 1 dose (anaphylaxis).    Dispense:  2 each    Refill:  1    Patient Instructions  Venom allergy Avoid stinging insects. In case of an allergic reaction, give Benadryl 4 teaspoonfuls every 4 hours, and if life-threatening symptoms occur, inject with EpiPen 0.3 mg. Continue venom immunotherapy every 6 weeks per protocol  Please let us know if this treatment plan is not working well for you Schedule a follow-up appointment in 1 year Return in about 1 year (around 02/07/2022), or if  symptoms worsen or fail to improve.    Thank you for the opportunity to care for this patient.  Please do not hesitate to contact me with questions.  Nehemiah Settle, FNP [facility]

## 2021-02-14 ENCOUNTER — Telehealth: Payer: Self-pay

## 2021-02-14 MED ORDER — CARBIDOPA-LEVODOPA 25-100 MG PO TABS: ORAL_TABLET | ORAL | 0 refills | Status: DC

## 2021-02-14 NOTE — Telephone Encounter (Signed)
Pharmacy refill request for Carbidopa Levodopa. 30 days sent to pharmacy

## 2021-02-21 DIAGNOSIS — M25552 Pain in left hip: Secondary | ICD-10-CM | POA: Diagnosis not present

## 2021-03-05 ENCOUNTER — Ambulatory Visit (INDEPENDENT_AMBULATORY_CARE_PROVIDER_SITE_OTHER): Payer: Medicare PPO

## 2021-03-05 ENCOUNTER — Other Ambulatory Visit: Payer: Self-pay

## 2021-03-05 DIAGNOSIS — T63441D Toxic effect of venom of bees, accidental (unintentional), subsequent encounter: Secondary | ICD-10-CM | POA: Diagnosis not present

## 2021-03-12 NOTE — Progress Notes (Signed)
Assessment/Plan:   1.  Parkinsons Disease  -Continue carbidopa/levodopa 25/100, 1.5 tablets 3 times per day  -discussed voice changes with Parkinsons Disease.  Think that the changes are likely all Parkinsons Disease related.  She wanted referral to ENT just to make sure that the voice changes and frequent swallow are nothing else concerning.  Will refer.    2.  Severe spinal canal stenosis at L3-L4  -Follows with Dr. Venetia Maxon and seems to be doing ok in this regard.  3.  History of expressive aphasia, likely representing complicated migraine with left-sided paresthesias while in the office today.  -On aspirin and Lipitor.  Extensive negative work-up.  -having L sided paresthesias today.  This resolved before I even walked into the office, but happened when she was in my waiting room.  Neuro exam was nonfocal and nonlateralizing today, but nonetheless I was quite concerned.  Going to do CT brain stat today and she was encouraged to go to the emergency room should symptoms arise again.  She promised me she would.  4.  Sialorrhea  -This is commonly associated with PD.  We talked about treatments.  The patient is not a candidate for oral anticholinergic therapy because of increased risk of confusion and falls.  We discussed Botox (type A and B) and 1% atropine drops.  We discusssed that candy like lemon drops can help by stimulating mm of the oropharynx to induce swallowing.  She is not interested right now  5.  Adjustment d/o  -Patient under an enormous amount of stress with her husband in skilled care at [facility].  She goes daily.  I think it would be beneficial for her to consider an SSRI.  She really does not want that right now and will let me know if she changes her mind.   Subjective:   Linda Lee was seen today in follow up for Parkinsons disease.  My previous records were reviewed prior to todays visit as well as outside records available to me.  Last visit, her biggest  issues really were with caregiving stresses and struggles.  Her husband has since been placed into skilled nursing at [facility].  In regards to her Parkinson's disease, she is managing quite well.  No falls.  No hallucinations.  Following with Dr. Jacinto Halim for thoracic aortic aneurysm.  She has been doing well in that regard so are only doing scans every other year.  Pt states that she was sitting in my waiting room and she had hand paresthesias and in the left arm and slight in the left lip.  It lasted under 5 min.  It didn't affect speech or ambulation.  She wonders if its "one of my migraines."  She had some recent migraines, but none today.  She is also worrying about her voice and gravely and wonders if she should go to ENT.  She is biking at [employer] - 1 day per week.  Current prescribed movement disorder medications: Carbidopa/levodopa 25/100, 1.5 tablets 3 times per day    ALLERGIES:   Allergies  Allergen Reactions   Ciprofloxacin     Can not take due to hx of Cdiff per patient   Wasp Venom Other (See Comments)    dizziness   Fluconazole Rash    CURRENT MEDICATIONS:  Outpatient Encounter Medications as of 03/13/2021  Medication Sig   aspirin 81 MG EC tablet Take 81 mg by mouth daily. Swallow whole.   atorvastatin (LIPITOR) 20 MG tablet Take 1 tablet (  20 mg total) by mouth daily after supper.   carbidopa-levodopa (SINEMET IR) 25-100 MG tablet TAKE 1 AND 1/2 TABLETS BY MOUTH THREE TIMES DAILY   CRANBERRY PO Take 300 mg daily by mouth.   Cyanocobalamin (VITAMIN B-12 PO) Take 1,200 mcg daily by mouth.    EPINEPHrine 0.3 mg/0.3 mL IJ SOAJ injection Inject 0.3 mg into the muscle once as needed for up to 1 dose (anaphylaxis).   Ergocalciferol (VITAMIN D2) 2000 units TABS Take 2,000 Units daily by mouth.    estradiol (ESTRACE) 0.1 MG/GM vaginal cream Place 1 Applicatorful vaginally once a week.    furosemide (LASIX) 20 MG tablet Take 20 mg by mouth. Take 1 tablet  M,W,F   KLOR-CON M10  10 MEQ tablet Take 1 tablet on M/W/F   omeprazole (PRILOSEC) 40 MG capsule Take 40 mg by mouth continuous as needed.    propranolol ER (INDERAL LA) 80 MG 24 hr capsule TAKE 1 CAPSULE(80 MG) BY MOUTH DAILY   saccharomyces boulardii (FLORASTOR) 250 MG capsule Take 250 mg by mouth 3 (three) times a week.   No facility-administered encounter medications on file as of 03/13/2021.    Objective:   PHYSICAL EXAMINATION:    VITALS:   Vitals:   03/13/21 1015  BP: 140/63  Pulse: 65  SpO2: 90%  Weight: 153 lb 6.4 oz (69.6 kg)  Height: 5\' 4"  (1.626 m)     GEN:  The patient appears stated age and is in NAD. HEENT:  Normocephalic, atraumatic.  The mucous membranes are moist. The superficial temporal arteries are without ropiness or tenderness. CV:  RRR Lungs:  CTAB Neck/HEME:  There are no carotid bruits bilaterally.  Neurological examination:  Orientation: The patient is alert and oriented x3. Cranial nerves: There is good facial symmetry without significant facial hypomimia. The speech is fluent and clear. Soft palate rises symmetrically and there is no tongue deviation. Hearing is intact to conversational tone. Sensation: Sensation is intact to light touch throughout.  No extinction with double simultaneous stimulation. Motor: Strength is 5/5 in the upper and lower extremities.  There is no pronator drift.  Movement examination: Tone: There is nl tone in the UE/LE Abnormal movements: rare tremor on the R Coordination:  There is no decremation today, with any form of RAMS, including alternating supination and pronation of the forearm, hand opening and closing, finger taps, heel taps and toe taps. Gait and Station: The patient has no difficulty arising out of a deep-seated chair without the use of the hands. The patient's stride length is normal today.  I have reviewed and interpreted the following labs independently    Chemistry      Component Value Date/Time   NA 142 08/11/2018 1108    K 3.9 08/11/2018 1108   CL 107 08/11/2018 1108   CO2 23 08/11/2018 1039   BUN 20 08/11/2018 1108   CREATININE 0.90 08/11/2018 1108      Component Value Date/Time   CALCIUM 9.0 08/11/2018 1039   ALKPHOS 89 08/11/2018 1039   AST 15 08/11/2018 1039   ALT <5 08/11/2018 1039   BILITOT 0.8 08/11/2018 1039       Lab Results  Component Value Date   WBC 4.3 08/11/2018   HGB 13.6 08/11/2018   HCT 40.0 08/11/2018   MCV 95.4 08/11/2018   PLT 157 08/11/2018      Total time spent on today's visit was 31 minutes, including both face-to-face time and nonface-to-face time.  Time included that spent on  review of records (prior notes available to me/labs/imaging if pertinent), discussing treatment and goals, answering patient's questions and coordinating care.  Cc:  Rodrigo Ran, MD

## 2021-03-13 ENCOUNTER — Other Ambulatory Visit: Payer: Self-pay

## 2021-03-13 ENCOUNTER — Encounter: Payer: Self-pay | Admitting: Neurology

## 2021-03-13 ENCOUNTER — Ambulatory Visit: Payer: Medicare PPO | Admitting: Neurology

## 2021-03-13 ENCOUNTER — Ambulatory Visit
Admission: RE | Admit: 2021-03-13 | Discharge: 2021-03-13 | Disposition: A | Discharge status: Home / Self Care | Payer: Medicare PPO | Source: Ambulatory Visit | Attending: Neurology | Admitting: Neurology

## 2021-03-13 VITALS — BP 140/63 | HR 65 | Ht 64.0 in | Wt 153.4 lb

## 2021-03-13 DIAGNOSIS — G20 Parkinson's disease: Secondary | ICD-10-CM | POA: Diagnosis not present

## 2021-03-13 DIAGNOSIS — R200 Anesthesia of skin: Secondary | ICD-10-CM | POA: Diagnosis not present

## 2021-03-13 DIAGNOSIS — R1319 Other dysphagia: Secondary | ICD-10-CM

## 2021-03-13 DIAGNOSIS — R202 Paresthesia of skin: Secondary | ICD-10-CM | POA: Diagnosis not present

## 2021-03-13 MED ORDER — CARBIDOPA-LEVODOPA 25-100 MG PO TABS: ORAL_TABLET | ORAL | 2 refills | Status: DC

## 2021-03-13 NOTE — Patient Instructions (Signed)
If you have any further symptoms, go to the ER!!!  Let me know if you would like something for stress/mood

## 2021-03-20 DIAGNOSIS — M170 Bilateral primary osteoarthritis of knee: Secondary | ICD-10-CM | POA: Diagnosis not present

## 2021-03-27 DIAGNOSIS — M1712 Unilateral primary osteoarthritis, left knee: Secondary | ICD-10-CM | POA: Diagnosis not present

## 2021-04-03 ENCOUNTER — Telehealth: Payer: Self-pay

## 2021-04-03 DIAGNOSIS — M1712 Unilateral primary osteoarthritis, left knee: Secondary | ICD-10-CM | POA: Diagnosis not present

## 2021-04-03 NOTE — Telephone Encounter (Signed)
New message    Call patient regarding ENT referral.   The patient verbalized an appointment with ENT with Dr. Misty Stanley on 04/04/2021 @ 8:00 am in [facility].

## 2021-04-12 ENCOUNTER — Ambulatory Visit: Payer: Medicare PPO | Admitting: Allergy & Immunology

## 2021-04-16 ENCOUNTER — Other Ambulatory Visit: Payer: Self-pay

## 2021-04-16 ENCOUNTER — Ambulatory Visit (INDEPENDENT_AMBULATORY_CARE_PROVIDER_SITE_OTHER): Payer: Medicare PPO

## 2021-04-16 DIAGNOSIS — T63441D Toxic effect of venom of bees, accidental (unintentional), subsequent encounter: Secondary | ICD-10-CM | POA: Diagnosis not present

## 2021-04-24 DIAGNOSIS — R0989 Other specified symptoms and signs involving the circulatory and respiratory systems: Secondary | ICD-10-CM | POA: Diagnosis not present

## 2021-04-24 DIAGNOSIS — J383 Other diseases of vocal cords: Secondary | ICD-10-CM | POA: Diagnosis not present

## 2021-04-24 DIAGNOSIS — K219 Gastro-esophageal reflux disease without esophagitis: Secondary | ICD-10-CM | POA: Diagnosis not present

## 2021-05-03 DIAGNOSIS — M859 Disorder of bone density and structure, unspecified: Secondary | ICD-10-CM | POA: Diagnosis not present

## 2021-05-03 DIAGNOSIS — E785 Hyperlipidemia, unspecified: Secondary | ICD-10-CM | POA: Diagnosis not present

## 2021-05-11 DIAGNOSIS — Z0000 Encounter for general adult medical examination without abnormal findings: Secondary | ICD-10-CM | POA: Diagnosis not present

## 2021-05-11 DIAGNOSIS — Z1212 Encounter for screening for malignant neoplasm of rectum: Secondary | ICD-10-CM | POA: Diagnosis not present

## 2021-05-11 DIAGNOSIS — G20 Parkinson's disease: Secondary | ICD-10-CM | POA: Diagnosis not present

## 2021-05-11 DIAGNOSIS — I10 Essential (primary) hypertension: Secondary | ICD-10-CM | POA: Diagnosis not present

## 2021-05-11 DIAGNOSIS — G459 Transient cerebral ischemic attack, unspecified: Secondary | ICD-10-CM | POA: Diagnosis not present

## 2021-05-11 DIAGNOSIS — Z1331 Encounter for screening for depression: Secondary | ICD-10-CM | POA: Diagnosis not present

## 2021-05-11 DIAGNOSIS — E785 Hyperlipidemia, unspecified: Secondary | ICD-10-CM | POA: Diagnosis not present

## 2021-05-11 DIAGNOSIS — D259 Leiomyoma of uterus, unspecified: Secondary | ICD-10-CM | POA: Diagnosis not present

## 2021-05-11 DIAGNOSIS — M8580 Other specified disorders of bone density and structure, unspecified site: Secondary | ICD-10-CM | POA: Diagnosis not present

## 2021-05-11 DIAGNOSIS — R82998 Other abnormal findings in urine: Secondary | ICD-10-CM | POA: Diagnosis not present

## 2021-05-11 DIAGNOSIS — G43109 Migraine with aura, not intractable, without status migrainosus: Secondary | ICD-10-CM | POA: Diagnosis not present

## 2021-05-11 DIAGNOSIS — Z1389 Encounter for screening for other disorder: Secondary | ICD-10-CM | POA: Diagnosis not present

## 2021-05-11 DIAGNOSIS — Z23 Encounter for immunization: Secondary | ICD-10-CM | POA: Diagnosis not present

## 2021-05-11 DIAGNOSIS — I351 Nonrheumatic aortic (valve) insufficiency: Secondary | ICD-10-CM | POA: Diagnosis not present

## 2021-05-12 ENCOUNTER — Other Ambulatory Visit: Payer: Self-pay | Admitting: Cardiology

## 2021-05-17 DIAGNOSIS — M1711 Unilateral primary osteoarthritis, right knee: Secondary | ICD-10-CM | POA: Diagnosis not present

## 2021-05-17 DIAGNOSIS — M1712 Unilateral primary osteoarthritis, left knee: Secondary | ICD-10-CM | POA: Diagnosis not present

## 2021-05-17 DIAGNOSIS — M170 Bilateral primary osteoarthritis of knee: Secondary | ICD-10-CM | POA: Diagnosis not present

## 2021-05-28 ENCOUNTER — Ambulatory Visit: Payer: Medicare PPO

## 2021-05-29 DIAGNOSIS — L570 Actinic keratosis: Secondary | ICD-10-CM | POA: Diagnosis not present

## 2021-05-29 DIAGNOSIS — D1801 Hemangioma of skin and subcutaneous tissue: Secondary | ICD-10-CM | POA: Diagnosis not present

## 2021-05-29 DIAGNOSIS — D0472 Carcinoma in situ of skin of left lower limb, including hip: Secondary | ICD-10-CM | POA: Diagnosis not present

## 2021-05-29 DIAGNOSIS — L814 Other melanin hyperpigmentation: Secondary | ICD-10-CM | POA: Diagnosis not present

## 2021-05-29 DIAGNOSIS — L820 Inflamed seborrheic keratosis: Secondary | ICD-10-CM | POA: Diagnosis not present

## 2021-05-29 DIAGNOSIS — Z85828 Personal history of other malignant neoplasm of skin: Secondary | ICD-10-CM | POA: Diagnosis not present

## 2021-05-30 ENCOUNTER — Ambulatory Visit (INDEPENDENT_AMBULATORY_CARE_PROVIDER_SITE_OTHER): Payer: Medicare PPO

## 2021-05-30 ENCOUNTER — Other Ambulatory Visit: Payer: Self-pay

## 2021-05-30 DIAGNOSIS — T63441D Toxic effect of venom of bees, accidental (unintentional), subsequent encounter: Secondary | ICD-10-CM | POA: Diagnosis not present

## 2021-06-05 DIAGNOSIS — K219 Gastro-esophageal reflux disease without esophagitis: Secondary | ICD-10-CM | POA: Diagnosis not present

## 2021-06-05 DIAGNOSIS — J383 Other diseases of vocal cords: Secondary | ICD-10-CM | POA: Diagnosis not present

## 2021-06-05 DIAGNOSIS — R0989 Other specified symptoms and signs involving the circulatory and respiratory systems: Secondary | ICD-10-CM | POA: Diagnosis not present

## 2021-07-11 ENCOUNTER — Other Ambulatory Visit: Payer: Self-pay

## 2021-07-11 ENCOUNTER — Ambulatory Visit (INDEPENDENT_AMBULATORY_CARE_PROVIDER_SITE_OTHER): Payer: Medicare PPO

## 2021-07-11 DIAGNOSIS — T63441D Toxic effect of venom of bees, accidental (unintentional), subsequent encounter: Secondary | ICD-10-CM | POA: Diagnosis not present

## 2021-07-23 DIAGNOSIS — M25551 Pain in right hip: Secondary | ICD-10-CM | POA: Diagnosis not present

## 2021-07-23 DIAGNOSIS — M170 Bilateral primary osteoarthritis of knee: Secondary | ICD-10-CM | POA: Diagnosis not present

## 2021-08-01 DIAGNOSIS — M25551 Pain in right hip: Secondary | ICD-10-CM | POA: Diagnosis not present

## 2021-08-15 DIAGNOSIS — J383 Other diseases of vocal cords: Secondary | ICD-10-CM | POA: Diagnosis not present

## 2021-08-15 DIAGNOSIS — R0989 Other specified symptoms and signs involving the circulatory and respiratory systems: Secondary | ICD-10-CM | POA: Diagnosis not present

## 2021-08-15 DIAGNOSIS — K219 Gastro-esophageal reflux disease without esophagitis: Secondary | ICD-10-CM | POA: Diagnosis not present

## 2021-08-22 ENCOUNTER — Ambulatory Visit (INDEPENDENT_AMBULATORY_CARE_PROVIDER_SITE_OTHER): Payer: Medicare PPO

## 2021-08-22 ENCOUNTER — Other Ambulatory Visit: Payer: Self-pay

## 2021-08-22 DIAGNOSIS — T63441D Toxic effect of venom of bees, accidental (unintentional), subsequent encounter: Secondary | ICD-10-CM | POA: Diagnosis not present

## 2021-09-06 ENCOUNTER — Other Ambulatory Visit: Payer: Self-pay | Admitting: Student

## 2021-09-06 DIAGNOSIS — I712 Thoracic aortic aneurysm, without rupture, unspecified: Secondary | ICD-10-CM

## 2021-09-14 DIAGNOSIS — M25551 Pain in right hip: Secondary | ICD-10-CM | POA: Diagnosis not present

## 2021-09-25 ENCOUNTER — Ambulatory Visit
Admission: RE | Admit: 2021-09-25 | Discharge: 2021-09-25 | Disposition: A | Discharge status: Home / Self Care | Payer: Medicare PPO | Source: Ambulatory Visit | Attending: Student | Admitting: Student

## 2021-09-25 DIAGNOSIS — I2584 Coronary atherosclerosis due to calcified coronary lesion: Secondary | ICD-10-CM | POA: Diagnosis not present

## 2021-09-25 DIAGNOSIS — I712 Thoracic aortic aneurysm, without rupture, unspecified: Secondary | ICD-10-CM | POA: Diagnosis not present

## 2021-09-25 MED ORDER — IOPAMIDOL (ISOVUE-370) INJECTION 76%
75.0000 mL | Freq: Once | INTRAVENOUS | Status: AC | PRN
  Administered 2021-09-25: 75 mL via INTRAVENOUS

## 2021-10-03 ENCOUNTER — Ambulatory Visit (INDEPENDENT_AMBULATORY_CARE_PROVIDER_SITE_OTHER): Payer: Medicare PPO

## 2021-10-03 ENCOUNTER — Other Ambulatory Visit: Payer: Self-pay

## 2021-10-03 DIAGNOSIS — T63441D Toxic effect of venom of bees, accidental (unintentional), subsequent encounter: Secondary | ICD-10-CM

## 2021-10-05 ENCOUNTER — Ambulatory Visit: Payer: Medicare PPO | Admitting: Cardiology

## 2021-10-05 ENCOUNTER — Other Ambulatory Visit: Payer: Self-pay

## 2021-10-05 ENCOUNTER — Encounter: Payer: Self-pay | Admitting: Cardiology

## 2021-10-05 VITALS — BP 133/72 | HR 61 | Temp 97.8°F | Resp 16 | Ht 64.0 in | Wt 158.4 lb

## 2021-10-05 DIAGNOSIS — I2510 Atherosclerotic heart disease of native coronary artery without angina pectoris: Secondary | ICD-10-CM | POA: Diagnosis not present

## 2021-10-05 DIAGNOSIS — I7121 Aneurysm of the ascending aorta, without rupture: Secondary | ICD-10-CM | POA: Diagnosis not present

## 2021-10-05 DIAGNOSIS — E7800 Pure hypercholesterolemia, unspecified: Secondary | ICD-10-CM

## 2021-10-05 NOTE — Progress Notes (Signed)
? ?Primary Physician/Referring:  Rodrigo Ran, MD ? ?Patient ID: Linda Lee, female    DOB: [birth year], 86 y.o.   MRN: [id number] ? ?No chief complaint on file. ? ?HPI:   ? ?Linda Lee  is a 86 y.o. with Parkinson's disease, hyperlipidemia, 4.5 cm ascending thoracicaortic aneurysm, complex migraines, and coronary calcification on CT scan. She has had episodes of trouble talking and difficulty expressing herself and headache afterwards. Differential diagnosis is TIA vs complicated migraine. Last episode was in August 2020. S She is followed by Dr. Arbutus Leas. ?  ?She was last seen 1 year ago,  had repeat CTA of the chest and presents for follow-up.  Overall she feels that she is doing well. Has chronic dyspnea on exertion has been stable.  She also has history of Parkinson's that has been stable.    ? ?Past Medical History:  ?Diagnosis Date  ? Anginal pain (HCC)   ? Angio-edema   ? Arthritis   ? Atypical chest pain 07/06/2009  ? Qualifier: Diagnosis of  By: Clifton James, MD, [facility]    ? GERD (gastroesophageal reflux disease)   ? Hyperlipemia   ? Migraines   ? Toxic effect of venom of bees, unintentional 06/03/2017  ? ? ?Social History  ? ?Tobacco Use  ? Smoking status: Former  ?  Types: Cigarettes  ?  Quit date: 08/15/1963  ?  Years since quitting: 58.1  ? Smokeless tobacco: Never  ?Substance Use Topics  ? Alcohol use: Yes  ?  Alcohol/week: 1.0 standard drink  ?  Types: 1 Glasses of wine per week  ?  Comment: q hs  ? ? ?ROS  ?Review of Systems  ?Cardiovascular:  Positive for dyspnea on exertion (chronic, stable). Negative for chest pain, claudication, leg swelling, orthopnea and palpitations.  ?Neurological:  Positive for tremors (Parkinsons). Negative for focal weakness and headaches.  ?Objective  ?There were no vitals taken for this visit.  ?Vitals with BMI 03/13/2021 02/07/2021 10/05/2020  ?Height 5\' 4"  5\' 4"  5' 4.5"  ?Weight 153 lbs 6 oz 156 lbs 13 oz 152 lbs  ?BMI 26.32 26.9 25.7  ?Systolic 140 136   ?Diastolic 63 68 78  ?Pulse 65 58 55  ?  ? Physical Exam ?Constitutional:   ?   Appearance: She is well-developed.  ?Cardiovascular:  ?   Rate and Rhythm: Normal rate and regular rhythm.  ?   Pulses: Intact distal pulses.  ?   Heart sounds: Normal heart sounds.  ?Pulmonary:  ?   Effort: Pulmonary effort is normal. No accessory muscle usage or respiratory distress.  ?   Breath sounds: Normal breath sounds.  ?Abdominal:  ?   General: Bowel sounds are normal.  ?   Palpations: Abdomen is soft.  ? ?Laboratory examination:  ? ?External labs: ? ?Cholesterol, total 138.000 m 05/03/2021 ?HDL 43.000 mg 05/03/2021 ?LDL 83.000 mg 05/03/2021 ?Triglycerides 60.000 mg 05/03/2021 ? ?A1C 5.200 % 08/11/2018 ? ?Hemoglobin 12.800 g/d 04/06/2019 ? ?Creatinine, Serum 0.800 mg/ 04/06/2019 ?Potassium 4.200 mEq 05/03/2021 ?ALT (SGPT) 6.000 IU/L 05/03/2021 ? ?TSH 1.980 05/03/2021 ? ?Medications and allergies  ? ?Allergies  ?Allergen Reactions  ? Ciprofloxacin   ?  Can not take due to hx of Cdiff per patient  ? Wasp Venom Other (See Comments)  ?  dizziness  ? Fluconazole Rash  ?  ? ?Current Outpatient Medications:  ?  aspirin 81 MG EC tablet, Take 81 mg by mouth daily. Swallow whole., Disp: , Rfl:  ?  atorvastatin (LIPITOR)  20 MG tablet, Take 1 tablet (20 mg total) by mouth daily after supper., Disp: 30 tablet, Rfl: 0 ?  carbidopa-levodopa (SINEMET IR) 25-100 MG tablet, TAKE 1 AND 1/2 TABLETS BY MOUTH THREE TIMES DAILY, Disp: 405 tablet, Rfl: 2 ?  CRANBERRY PO, Take 300 mg daily by mouth., Disp: , Rfl:  ?  Cyanocobalamin (VITAMIN B-12 PO), Take 1,200 mcg daily by mouth. , Disp: , Rfl:  ?  EPINEPHrine 0.3 mg/0.3 mL IJ SOAJ injection, Inject 0.3 mg into the muscle once as needed for up to 1 dose (anaphylaxis)., Disp: 2 each, Rfl: 1 ?  Ergocalciferol (VITAMIN D2) 2000 units TABS, Take 2,000 Units daily by mouth. , Disp: , Rfl:  ?  estradiol (ESTRACE) 0.1 MG/GM vaginal cream, Place 1 Applicatorful vaginally once a week. , Disp: , Rfl:  ?  furosemide (LASIX)  20 MG tablet, Take 20 mg by mouth. Take 1 tablet  M,W,F, Disp: , Rfl:  ?  KLOR-CON M10 10 MEQ tablet, Take 1 tablet on M/W/F, Disp: , Rfl:  ?  omeprazole (PRILOSEC) 40 MG capsule, Take 40 mg by mouth continuous as needed. , Disp: , Rfl:  ?  propranolol ER (INDERAL LA) 80 MG 24 hr capsule, TAKE 1 CAPSULE(80 MG) BY MOUTH DAILY, Disp: 90 capsule, Rfl: 3 ?  saccharomyces boulardii (FLORASTOR) 250 MG capsule, Take 250 mg by mouth 3 (three) times a week., Disp: , Rfl:   ?Radiology:  ? ?CT angiogram chest 09/25/2021: ?1. Stable uncomplicated fusiform aneurysmal dilatation involving the ascending thoracic aorta measuring 44 mm in diameter, unchanged ?since the 07/2009 examination. This examination documents over a decade of stability. Aortic aneurysm NOS (ICD10-I71.9). ?2. Similar findings of cardiomegaly and borderline enlargement of the caliber the main pulmonary artery, nonspecific though could be ?seen in the setting of pulmonary arterial hypertension. ?3. Coronary artery calcifications. Aortic Atherosclerosis (ICD10-I70.0). ? ?MRI of the abdomen 01/12/2020: ?1. Normal pancreas.  No pancreatic mass, see comments. ?2. Numerous benign liver cysts. No suspicious liver masses. ?3. No biliary ductal dilatation. ?4. Marked diffuse colonic diverticulosis. ? ?Cardiac Studies:  ? ?30 day event monitor 01/22-02/20/2020: Dominant rhythm is normal sinus. 3 patient reported events without any symptoms correlated with sinus rhythm. 2 auto detected events of NSVT on 01/30 for 4 beats and 02/14 for 6 beats that were both asymptomatic. Lowest HR was 43 during sleep and fastest HR was 125 bpm. No A fib was noted.  ?  ?Double contrast echocardiogram 08/26/2018: Normal LV systolic function, no intracardiac shunting noted. Limited study. ?  ?Right and left heart cath 06/17/2017: Normal right and left heart cath. RA 8/5/4, 71%, RV 28//3/7; PA 29/6/18, sat 72%; PW 13/10/8, Ao sat 99%. CO 4.8, CI 2.73. Qp:Qs 1.04. ?  ?08/10/2018: MRI brain and CTA  neck normal ? ?EKG: ?EKG 10/05/2021: Normal sinus rhythm at rate of 50 bpm.  Normal EKG. no change from 10/05/2020, heart rate was 52 bpm.  ? ?Assessment  ? ?  ICD-10-CM   ?1. Aneurysm of ascending aorta without rupture  I71.21   ?  ?2. Hypercholesteremia  E78.00   ?  ?3. History of TIA (transient ischemic attack)  Z86.73   ?  ? ?No orders of the defined types were placed in this encounter. ? ?There are no discontinued medications. ?  ?No orders of the defined types were placed in this encounter. ?  ?Recommendations:  ? ?ARELENE MORONI  is a 86 y.o. with Parkinson's disease, hyperlipidemia, 4.5 cm ascending thoracicaortic aneurysm, complex migraines,  and coronary calcification on CT scan. She has had episodes difficulty expressing herself and headache afterwards. Differential diagnosis is TIA vs complicated migraine. Last episode was in August 2020. She has had negative echocardiogram and event monitor in 2020.  She is followed by Dr. Arbutus Leas. ? ?Patient is overall doing well since last seen by [place] 1 year ago.  I reviewed the results of the CT angiogram of the chest, her aortic aneurysm is remained stable for >10 years.  Advised her that we will continue to monitor this may be every other year or every 3 years depending upon clinical presentations and situations.  She has not had any chest pain, advised her I discussed with her regarding presentation of aortic dissection. ? ?With regard to coronary calcification, she has no symptoms of angina pectoris, she is on a statin and lipids are under excellent control. ? ?Lipids are under excellent control.  Blood pressure slightly elevated today, she has an appointment to see Dr. Guillermina City, in view of AAA, we could consider addition of ARB even low-dose.  Otherwise stable from cardiac standpoint, I will see him back in a year or sooner if problems.  ? ? ?Yates Decamp, MD, [facility] ?10/05/2021, 7:04 AM ?Office: [phone] ?Pager: [phone]  ?

## 2021-10-10 DIAGNOSIS — H524 Presbyopia: Secondary | ICD-10-CM | POA: Diagnosis not present

## 2021-10-10 DIAGNOSIS — H52203 Unspecified astigmatism, bilateral: Secondary | ICD-10-CM | POA: Diagnosis not present

## 2021-10-10 DIAGNOSIS — H26491 Other secondary cataract, right eye: Secondary | ICD-10-CM | POA: Diagnosis not present

## 2021-10-10 DIAGNOSIS — Z961 Presence of intraocular lens: Secondary | ICD-10-CM | POA: Diagnosis not present

## 2021-11-14 ENCOUNTER — Ambulatory Visit (INDEPENDENT_AMBULATORY_CARE_PROVIDER_SITE_OTHER): Payer: Medicare PPO

## 2021-11-14 DIAGNOSIS — T63441D Toxic effect of venom of bees, accidental (unintentional), subsequent encounter: Secondary | ICD-10-CM | POA: Diagnosis not present

## 2021-11-20 DIAGNOSIS — G43109 Migraine with aura, not intractable, without status migrainosus: Secondary | ICD-10-CM | POA: Diagnosis not present

## 2021-11-20 DIAGNOSIS — M8589 Other specified disorders of bone density and structure, multiple sites: Secondary | ICD-10-CM | POA: Diagnosis not present

## 2021-11-20 DIAGNOSIS — I351 Nonrheumatic aortic (valve) insufficiency: Secondary | ICD-10-CM | POA: Diagnosis not present

## 2021-11-20 DIAGNOSIS — G459 Transient cerebral ischemic attack, unspecified: Secondary | ICD-10-CM | POA: Diagnosis not present

## 2021-11-20 DIAGNOSIS — E785 Hyperlipidemia, unspecified: Secondary | ICD-10-CM | POA: Diagnosis not present

## 2021-11-20 DIAGNOSIS — M8580 Other specified disorders of bone density and structure, unspecified site: Secondary | ICD-10-CM | POA: Diagnosis not present

## 2021-11-20 DIAGNOSIS — H9190 Unspecified hearing loss, unspecified ear: Secondary | ICD-10-CM | POA: Diagnosis not present

## 2021-11-20 DIAGNOSIS — I10 Essential (primary) hypertension: Secondary | ICD-10-CM | POA: Diagnosis not present

## 2021-12-20 DIAGNOSIS — H903 Sensorineural hearing loss, bilateral: Secondary | ICD-10-CM | POA: Diagnosis not present

## 2021-12-26 ENCOUNTER — Ambulatory Visit: Payer: Medicare PPO

## 2021-12-26 ENCOUNTER — Ambulatory Visit (INDEPENDENT_AMBULATORY_CARE_PROVIDER_SITE_OTHER): Payer: Medicare PPO

## 2021-12-26 DIAGNOSIS — T63441D Toxic effect of venom of bees, accidental (unintentional), subsequent encounter: Secondary | ICD-10-CM

## 2022-01-06 ENCOUNTER — Encounter (HOSPITAL_BASED_OUTPATIENT_CLINIC_OR_DEPARTMENT_OTHER): Payer: Self-pay | Admitting: Emergency Medicine

## 2022-01-06 ENCOUNTER — Other Ambulatory Visit: Payer: Self-pay

## 2022-01-06 ENCOUNTER — Emergency Department (HOSPITAL_BASED_OUTPATIENT_CLINIC_OR_DEPARTMENT_OTHER)
Admission: EM | Admit: 2022-01-06 | Discharge: 2022-01-06 | Disposition: A | Discharge status: Home / Self Care | Payer: Medicare PPO | Attending: Emergency Medicine | Admitting: Emergency Medicine

## 2022-01-06 DIAGNOSIS — R109 Unspecified abdominal pain: Secondary | ICD-10-CM | POA: Diagnosis not present

## 2022-01-06 DIAGNOSIS — R197 Diarrhea, unspecified: Secondary | ICD-10-CM | POA: Diagnosis not present

## 2022-01-06 DIAGNOSIS — E860 Dehydration: Secondary | ICD-10-CM | POA: Insufficient documentation

## 2022-01-06 DIAGNOSIS — Z7982 Long term (current) use of aspirin: Secondary | ICD-10-CM | POA: Diagnosis not present

## 2022-01-06 DIAGNOSIS — Z8619 Personal history of other infectious and parasitic diseases: Secondary | ICD-10-CM | POA: Insufficient documentation

## 2022-01-06 LAB — COMPREHENSIVE METABOLIC PANEL
ALT: <5 U/L (ref 0–44)
AST: 12 U/L — ABNORMAL LOW (ref 15–41)
Albumin: 3.6 g/dL (ref 3.5–5.0)
Alkaline Phosphatase: 98 U/L (ref 38–126)
Anion gap: 7 (ref 5–15)
BUN: 27 mg/dL — ABNORMAL HIGH (ref 8–23)
CO2: 25 mmol/L (ref 22–32)
Calcium: 8.9 mg/dL (ref 8.9–10.3)
Chloride: 106 mmol/L (ref 98–111)
Creatinine, Ser: 0.91 mg/dL (ref 0.44–1.00)
GFR, Estimated: >60 mL/min (ref >60)
Glucose, Bld: 106 mg/dL — ABNORMAL HIGH (ref 70–99)
Potassium: 4.2 mmol/L (ref 3.5–5.1)
Sodium: 138 mmol/L (ref 135–145)
Total Bilirubin: 0.6 mg/dL (ref 0.3–1.2)
Total Protein: 7 g/dL (ref 6.5–8.1)

## 2022-01-06 LAB — LIPASE, BLOOD: Lipase: 22 U/L (ref 11–51)

## 2022-01-06 LAB — C DIFFICILE QUICK SCREEN W PCR REFLEX
C Diff antigen: NEGATIVE
C Diff interpretation: NOT DETECTED
C Diff toxin: NEGATIVE

## 2022-01-06 LAB — CBC
HCT: 40.1 % (ref 36.0–46.0)
Hemoglobin: 13.2 g/dL (ref 12.0–15.0)
MCH: 30.8 pg (ref 26.0–34.0)
MCHC: 32.9 g/dL (ref 30.0–36.0)
MCV: 93.7 fL (ref 80.0–100.0)
Platelets: 164 10*3/uL (ref 150–400)
RBC: 4.28 MIL/uL (ref 3.87–5.11)
RDW: 12.6 % (ref 11.5–15.5)
WBC: 4.7 10*3/uL (ref 4.0–10.5)
nRBC: 0 % (ref 0.0–0.2)

## 2022-01-06 NOTE — ED Notes (Signed)
Pt unable to urinate at this time but is aware of specimen needed. Did provide stool sample - dropped off at lab.

## 2022-01-06 NOTE — ED Triage Notes (Signed)
Pt c/o diarrhea onset Friday. Pt has history of C-Diff.

## 2022-01-06 NOTE — ED Notes (Signed)
Pt discharged home with instruction to collect stool specimen for the GI panel . Order is active . Supplied patient with appropriate specimen cup .  Lab made aware about possible drop off of the specimen after discharge .

## 2022-01-06 NOTE — ED Provider Notes (Signed)
[facility] EMERGENCY DEPARTMENT Provider Note   CSN: [id number] Arrival date & time: 01/06/22  1206     History  Chief Complaint  Patient presents with   Diarrhea    Linda Lee is a 86 y.o. female.  HPI     86 year old female comes in with chief complaint of diarrhea.  Patient has history of C. difficile colitis.  She comes to the ER with diarrhea starting Friday night.  Patient did not take any antibiotics in the last few weeks.  She denies any blood in the stool.  Patient does have some abdominal cramping, that gets better with diarrhea.  Review of system is negative for any dizziness, nausea, vomiting, fevers, chills.  Patient is not on any blood thinners.  Home Medications Prior to Admission medications   Medication Sig Start Date End Date Taking? Authorizing Provider  aspirin 81 MG EC tablet Take 81 mg by mouth daily. Swallow whole.    [provider]  atorvastatin (LIPITOR) 20 MG tablet Take 1 tablet (20 mg total) by mouth daily after supper. 08/11/18   Mesner, Barbara Cower, MD  carbidopa-levodopa (SINEMET IR) 25-100 MG tablet TAKE 1 AND 1/2 TABLETS BY MOUTH THREE TIMES DAILY 03/13/21   Tat, Octaviano Batty, DO  CRANBERRY PO Take 300 mg daily by mouth.    [provider]  Cyanocobalamin (VITAMIN B-12 PO) Take 1,200 mcg daily by mouth.     [provider]  EPINEPHrine 0.3 mg/0.3 mL IJ SOAJ injection Inject 0.3 mg into the muscle once as needed for up to 1 dose (anaphylaxis). 02/07/21   Nehemiah Settle, FNP  Ergocalciferol (VITAMIN D2) 2000 units TABS Take 2,000 Units daily by mouth.     [provider]  furosemide (LASIX) 20 MG tablet Take 20 mg by mouth 2 (two) times a week. Take 1 tablet  M, F    [provider]  KLOR-CON M10 10 MEQ tablet 2 (two) times a week. Take 1 tablet on M/F 05/11/19   [provider]  omeprazole (PRILOSEC) 40 MG capsule Take 40 mg by mouth continuous as needed.  06/15/18   [provider]   propranolol ER (INDERAL LA) 80 MG 24 hr capsule TAKE 1 CAPSULE(80 MG) BY MOUTH DAILY 05/15/21   Yates Decamp, MD  saccharomyces boulardii (FLORASTOR) 250 MG capsule Take 250 mg by mouth 3 (three) times a week.    [provider]      Allergies    Ciprofloxacin, Wasp venom, and Fluconazole    Review of Systems   Review of Systems  All other systems reviewed and are negative.  Physical Exam Updated Vital Signs BP (!) 157/59   Pulse (!) 55   Temp 98.1 F (36.7 C) (Oral)   Resp 14   Ht 5\' 5"  (1.651 m)   Wt 71.2 kg   SpO2 99%   BMI 26.13 kg/m  Physical Exam Vitals and nursing note reviewed.  Constitutional:      Appearance: She is well-developed.  HENT:     Head: Atraumatic.  Cardiovascular:     Rate and Rhythm: Normal rate.  Pulmonary:     Effort: Pulmonary effort is normal.  Musculoskeletal:     Cervical back: Normal range of motion and neck supple.  Skin:    General: Skin is warm and dry.  Neurological:     Mental Status: She is alert and oriented to person, place, and time.    ED Results / Procedures / Treatments  Labs (all labs ordered are listed, but only abnormal results are displayed) Labs Reviewed  COMPREHENSIVE METABOLIC PANEL - Abnormal; Notable for the following components:      Result Value   Glucose, Bld 106 (*)    BUN 27 (*)    AST 12 (*)    All other components within normal limits  C DIFFICILE QUICK SCREEN W PCR REFLEX    GASTROINTESTINAL PANEL BY PCR, STOOL (REPLACES STOOL CULTURE)  LIPASE, BLOOD  CBC  URINALYSIS, ROUTINE W REFLEX MICROSCOPIC    EKG None  Radiology No results found.  Procedures Procedures    Medications Ordered in ED Medications - No data to display  ED Course/ Medical Decision Making/ A&P                           Medical Decision Making Amount and/or Complexity of Data Reviewed Labs: ordered.   This patient presents to the ED with chief complaint(s) of diarrhea with pertinent past medical history  of previous history of C. difficile which further complicates the presenting complaint.  The differential diagnosis includes C. difficile colitis, viral colitis, foodborne illness/toxic colitis.   Patient really does not have any risk factors for C. difficile colitis. She is here primarily concerned that she could be having C. difficile though, she has history of it.  I reviewed patient's work-up from 2018, at that time her white count was normal.   The initial plan is to order basic labs to evaluate for any dehydration, electrolyte abnormality. CBC ordered to see if the white count is profoundly elevated, which would heighten the suspicion for C. difficile. Clinically patient is not septic.  Although she appears slightly dry, patient is okay with oral hydration.  No clinical concerns for small bowel obstruction, patient is not having any nausea, vomiting and no severe abdominal pain.  Records reviewed previous admission documents, reviewed previous C. difficile results from 2018.  Independent labs interpretation:  The following labs were independently interpreted: CBC with normal white count.  Electrolytes are normal.  Patient does have some uremia, concerns for dehydration   My pretest probability for C. difficile colitis is low.  Patient has not produced a stool sample here.  She will go home with equipment that she needs to turn in the stool to our lab for further work-up.   Final Clinical Impression(s) / ED Diagnoses Final diagnoses:  Diarrhea of presumed infectious origin  Dehydration    Rx / DC Orders ED Discharge Orders     None         Derwood Kaplan, MD 01/06/22 1538

## 2022-01-06 NOTE — Discharge Instructions (Addendum)
You are seen in the ER for diarrhea.  We have ordered C. difficile test for you along with stool studies to look and see if there is any viral infection.  The lab work-up is reassuring.  You have mild dehydration, therefore ensure that you are hydrating well.  You are unable to give [place] a stool sample while in the ED, please return to the ER with the stool samples as soon as you can so that we can run the test.  You can follow-up on the results of the test at home.  Return to the ER if you start having bloody stools, high fevers, severe nausea, vomiting, dizziness and near fainting.

## 2022-01-07 LAB — GASTROINTESTINAL PANEL BY PCR, STOOL (REPLACES STOOL CULTURE)

## 2022-01-15 ENCOUNTER — Other Ambulatory Visit: Payer: Self-pay

## 2022-01-15 MED ORDER — CARBIDOPA-LEVODOPA 25-100 MG PO TABS: ORAL_TABLET | ORAL | 0 refills | Status: DC

## 2022-01-18 ENCOUNTER — Other Ambulatory Visit (HOSPITAL_COMMUNITY): Payer: Self-pay | Admitting: *Deleted

## 2022-01-19 ENCOUNTER — Emergency Department (HOSPITAL_BASED_OUTPATIENT_CLINIC_OR_DEPARTMENT_OTHER): Payer: Medicare PPO

## 2022-01-19 ENCOUNTER — Other Ambulatory Visit: Payer: Self-pay

## 2022-01-19 ENCOUNTER — Inpatient Hospital Stay (HOSPITAL_BASED_OUTPATIENT_CLINIC_OR_DEPARTMENT_OTHER)
Admission: EM | Admit: 2022-01-19 | Discharge: 2022-01-22 | DRG: 175 | Disposition: A | Discharge status: Home Health | Payer: Medicare PPO | Attending: Internal Medicine | Admitting: Internal Medicine

## 2022-01-19 ENCOUNTER — Encounter (HOSPITAL_BASED_OUTPATIENT_CLINIC_OR_DEPARTMENT_OTHER): Payer: Self-pay | Admitting: Emergency Medicine

## 2022-01-19 DIAGNOSIS — I7121 Aneurysm of the ascending aorta, without rupture: Secondary | ICD-10-CM | POA: Diagnosis present

## 2022-01-19 DIAGNOSIS — Z7982 Long term (current) use of aspirin: Secondary | ICD-10-CM | POA: Diagnosis not present

## 2022-01-19 DIAGNOSIS — I248 Other forms of acute ischemic heart disease: Secondary | ICD-10-CM | POA: Diagnosis not present

## 2022-01-19 DIAGNOSIS — R778 Other specified abnormalities of plasma proteins: Secondary | ICD-10-CM | POA: Diagnosis present

## 2022-01-19 DIAGNOSIS — I10 Essential (primary) hypertension: Secondary | ICD-10-CM | POA: Diagnosis not present

## 2022-01-19 DIAGNOSIS — Z820 Family history of epilepsy and other diseases of the nervous system: Secondary | ICD-10-CM

## 2022-01-19 DIAGNOSIS — N390 Urinary tract infection, site not specified: Secondary | ICD-10-CM

## 2022-01-19 DIAGNOSIS — Z96651 Presence of right artificial knee joint: Secondary | ICD-10-CM | POA: Diagnosis present

## 2022-01-19 DIAGNOSIS — J312 Chronic pharyngitis: Secondary | ICD-10-CM | POA: Diagnosis present

## 2022-01-19 DIAGNOSIS — K219 Gastro-esophageal reflux disease without esophagitis: Secondary | ICD-10-CM | POA: Diagnosis not present

## 2022-01-19 DIAGNOSIS — Z20822 Contact with and (suspected) exposure to covid-19: Secondary | ICD-10-CM | POA: Diagnosis present

## 2022-01-19 DIAGNOSIS — I700 Atherosclerosis of aorta: Secondary | ICD-10-CM | POA: Diagnosis not present

## 2022-01-19 DIAGNOSIS — R918 Other nonspecific abnormal finding of lung field: Secondary | ICD-10-CM | POA: Diagnosis not present

## 2022-01-19 DIAGNOSIS — I2609 Other pulmonary embolism with acute cor pulmonale: Secondary | ICD-10-CM | POA: Diagnosis not present

## 2022-01-19 DIAGNOSIS — Z888 Allergy status to other drugs, medicaments and biological substances status: Secondary | ICD-10-CM | POA: Diagnosis not present

## 2022-01-19 DIAGNOSIS — I082 Rheumatic disorders of both aortic and tricuspid valves: Secondary | ICD-10-CM | POA: Diagnosis not present

## 2022-01-19 DIAGNOSIS — E782 Mixed hyperlipidemia: Secondary | ICD-10-CM | POA: Diagnosis present

## 2022-01-19 DIAGNOSIS — K7689 Other specified diseases of liver: Secondary | ICD-10-CM | POA: Diagnosis not present

## 2022-01-19 DIAGNOSIS — G20 Parkinson's disease: Secondary | ICD-10-CM | POA: Diagnosis not present

## 2022-01-19 DIAGNOSIS — I8390 Asymptomatic varicose veins of unspecified lower extremity: Secondary | ICD-10-CM | POA: Diagnosis present

## 2022-01-19 DIAGNOSIS — K5730 Diverticulosis of large intestine without perforation or abscess without bleeding: Secondary | ICD-10-CM | POA: Diagnosis not present

## 2022-01-19 DIAGNOSIS — I2510 Atherosclerotic heart disease of native coronary artery without angina pectoris: Secondary | ICD-10-CM | POA: Diagnosis present

## 2022-01-19 DIAGNOSIS — K117 Disturbances of salivary secretion: Secondary | ICD-10-CM | POA: Diagnosis present

## 2022-01-19 DIAGNOSIS — J9601 Acute respiratory failure with hypoxia: Secondary | ICD-10-CM | POA: Diagnosis present

## 2022-01-19 DIAGNOSIS — Z91030 Bee allergy status: Secondary | ICD-10-CM | POA: Diagnosis not present

## 2022-01-19 DIAGNOSIS — J9811 Atelectasis: Secondary | ICD-10-CM | POA: Diagnosis not present

## 2022-01-19 DIAGNOSIS — G20A1 Parkinson's disease without dyskinesia, without mention of fluctuations: Secondary | ICD-10-CM | POA: Diagnosis present

## 2022-01-19 DIAGNOSIS — I2699 Other pulmonary embolism without acute cor pulmonale: Principal | ICD-10-CM | POA: Diagnosis present

## 2022-01-19 DIAGNOSIS — R7989 Other specified abnormal findings of blood chemistry: Secondary | ICD-10-CM | POA: Diagnosis present

## 2022-01-19 DIAGNOSIS — Z87891 Personal history of nicotine dependence: Secondary | ICD-10-CM

## 2022-01-19 DIAGNOSIS — R8271 Bacteriuria: Secondary | ICD-10-CM | POA: Diagnosis not present

## 2022-01-19 DIAGNOSIS — R0602 Shortness of breath: Secondary | ICD-10-CM | POA: Diagnosis not present

## 2022-01-19 DIAGNOSIS — I77810 Thoracic aortic ectasia: Secondary | ICD-10-CM | POA: Diagnosis not present

## 2022-01-19 DIAGNOSIS — Z7901 Long term (current) use of anticoagulants: Secondary | ICD-10-CM | POA: Diagnosis not present

## 2022-01-19 DIAGNOSIS — K429 Umbilical hernia without obstruction or gangrene: Secondary | ICD-10-CM | POA: Diagnosis not present

## 2022-01-19 LAB — CBG MONITORING, ED: Glucose-Capillary: 102 mg/dL — ABNORMAL HIGH (ref 70–99)

## 2022-01-19 LAB — URINALYSIS, ROUTINE W REFLEX MICROSCOPIC
Bilirubin Urine: NEGATIVE
Glucose, UA: NEGATIVE mg/dL
Hgb urine dipstick: NEGATIVE
Ketones, ur: 15 mg/dL — AB
Nitrite: POSITIVE — AB
Protein, ur: 30 mg/dL — AB
Specific Gravity, Urine: 1.025 (ref 1.005–1.030)
pH: 5.5 (ref 5.0–8.0)

## 2022-01-19 LAB — CBC
HCT: 38.1 % (ref 36.0–46.0)
Hemoglobin: 12.3 g/dL (ref 12.0–15.0)
MCH: 30.8 pg (ref 26.0–34.0)
MCHC: 32.3 g/dL (ref 30.0–36.0)
MCV: 95.5 fL (ref 80.0–100.0)
Platelets: 142 10*3/uL — ABNORMAL LOW (ref 150–400)
RBC: 3.99 MIL/uL (ref 3.87–5.11)
RDW: 12.7 % (ref 11.5–15.5)
WBC: 5.2 10*3/uL (ref 4.0–10.5)
nRBC: 0 % (ref 0.0–0.2)

## 2022-01-19 LAB — TROPONIN I (HIGH SENSITIVITY)
Troponin I (High Sensitivity): 31 ng/L — ABNORMAL HIGH (ref <18)
Troponin I (High Sensitivity): 28 ng/L — ABNORMAL HIGH (ref <18)

## 2022-01-19 LAB — BASIC METABOLIC PANEL
Anion gap: 8 (ref 5–15)
BUN: 21 mg/dL (ref 8–23)
CO2: 25 mmol/L (ref 22–32)
Calcium: 8.5 mg/dL — ABNORMAL LOW (ref 8.9–10.3)
Chloride: 109 mmol/L (ref 98–111)
Creatinine, Ser: 0.92 mg/dL (ref 0.44–1.00)
GFR, Estimated: >60 mL/min (ref >60)
Glucose, Bld: 109 mg/dL — ABNORMAL HIGH (ref 70–99)
Potassium: 4.1 mmol/L (ref 3.5–5.1)
Sodium: 142 mmol/L (ref 135–145)

## 2022-01-19 LAB — D-DIMER, QUANTITATIVE: D-Dimer, Quant: 1.96 ug/mL-FEU — ABNORMAL HIGH (ref 0.00–0.50)

## 2022-01-19 LAB — HEPATIC FUNCTION PANEL
ALT: <5 U/L (ref 0–44)
AST: 12 U/L — ABNORMAL LOW (ref 15–41)
Albumin: 3.4 g/dL — ABNORMAL LOW (ref 3.5–5.0)
Alkaline Phosphatase: 100 U/L (ref 38–126)
Bilirubin, Direct: 0.1 mg/dL (ref 0.0–0.2)
Indirect Bilirubin: 0.7 mg/dL (ref 0.3–0.9)
Total Bilirubin: 0.8 mg/dL (ref 0.3–1.2)
Total Protein: 6.4 g/dL — ABNORMAL LOW (ref 6.5–8.1)

## 2022-01-19 LAB — LACTIC ACID, PLASMA: Lactic Acid, Venous: 1.1 mmol/L (ref 0.5–1.9)

## 2022-01-19 LAB — PROTIME-INR
INR: 1 (ref 0.8–1.2)
Prothrombin Time: 13.5 seconds (ref 11.4–15.2)

## 2022-01-19 LAB — URINALYSIS, MICROSCOPIC (REFLEX)
RBC / HPF: NONE SEEN RBC/hpf (ref 0–5)
WBC, UA: >50 WBC/hpf (ref 0–5)

## 2022-01-19 LAB — SARS CORONAVIRUS 2 BY RT PCR: SARS Coronavirus 2 by RT PCR: NEGATIVE

## 2022-01-19 MED ORDER — SODIUM CHLORIDE 0.9 % IV SOLN
500.0000 mg | Freq: Once | INTRAVENOUS | Status: AC
  Administered 2022-01-19: 500 mg via INTRAVENOUS
  Filled 2022-01-19: qty 5

## 2022-01-19 MED ORDER — HEPARIN (PORCINE) 25000 UT/250ML-% IV SOLN
1100.0000 [IU]/h | INTRAVENOUS | Status: DC
  Administered 2022-01-19: 1200 [IU]/h via INTRAVENOUS
  Administered 2022-01-21: 1100 [IU]/h via INTRAVENOUS
  Filled 2022-01-19 (×4): qty 250

## 2022-01-19 MED ORDER — SODIUM CHLORIDE 0.9 % IV SOLN
1.0000 g | Freq: Once | INTRAVENOUS | Status: AC
  Administered 2022-01-19: 1 g via INTRAVENOUS
  Filled 2022-01-19: qty 10

## 2022-01-19 MED ORDER — HEPARIN BOLUS VIA INFUSION
4500.0000 [IU] | Freq: Once | INTRAVENOUS | Status: AC
  Administered 2022-01-19: 4500 [IU] via INTRAVENOUS

## 2022-01-19 MED ORDER — IOHEXOL 350 MG/ML SOLN
100.0000 mL | Freq: Once | INTRAVENOUS | Status: AC | PRN
  Administered 2022-01-19: 75 mL via INTRAVENOUS

## 2022-01-19 NOTE — ED Notes (Signed)
Urine sample obtained prior to antibiotics administration.

## 2022-01-19 NOTE — ED Provider Notes (Signed)
Took over patient care at 3 PM.  New hypoxia.  Suspected pneumonia on chest x-ray however D-dimer is elevated and patient get a CT scan of her chest.  She has been having shortness of breath with exertion the last couple days with dry cough.  Radiology called me with report to state that patient has multiple PEs with suspected small infarcts.  Submassive PE.  Hemodynamically she stable.  We will start her on IV heparin and admit her to medicine.  This chart was dictated using voice recognition software.  Despite best efforts to proofread,  errors can occur which can change the documentation meaning.  .Critical Care  Performed by: Virgina Norfolk, DO Authorized by: Virgina Norfolk, DO   Critical care provider statement:    Critical care time (minutes):  40   Critical care was necessary to treat or prevent imminent or life-threatening deterioration of the following conditions:  Respiratory failure   Critical care was time spent personally by me on the following activities:  Blood draw for specimens, development of treatment plan with patient or surrogate, discussions with consultants, discussions with primary provider, evaluation of patient's response to treatment, examination of patient, obtaining history from patient or surrogate, ordering and performing treatments and interventions, ordering and review of laboratory studies, ordering and review of radiographic studies, pulse oximetry, re-evaluation of patient's condition and review of old charts   I assumed direction of critical care for this patient from another provider in my specialty: no     Care discussed with: admitting provider       Virgina Norfolk, DO 01/19/22 1622

## 2022-01-19 NOTE — ED Notes (Signed)
Ambulatory to and from restroom in attempts to obtain urine sample. Unsuccessful. Reports she is unable to void at this time. Call light within reach. Reports dyspnea on exertion.

## 2022-01-19 NOTE — ED Notes (Signed)
Patient transported to CT 

## 2022-01-19 NOTE — H&P (Signed)
History and Physical    Patient: Linda Lee MRN: [id number] DOA: 01/19/2022  Date of Service: the patient was seen and examined on 01/20/2022  Patient coming from: Home via [facility] ED  Chief Complaint:  Chief Complaint  Patient presents with   Dizziness    HPI:   86 year old female with past medical history of Parkinson disease, lumbar spinal stenosis, sialorrhea,ascending thoracic aortic aneurysm (4.5cm), complex migraine headaches, hyperlipidemia, gastroesophageal reflux disease who presents to med [facility] emergency department complaining of dyspnea on exertion, chest discomfort and nausea.  Patient explains that for approximately the past 2 to 3 days she has been experiencing shortness of breath.  Shortness of breath is worse with exertion and improved with rest.  As the days progressed patient's shortness of breath became associated with chest discomfort.  Patient describes this chest discomfort is right-sided, tight in quality and moderate in intensity.  Discomfort seems to occur more with exertion and improved with rest.  Patient complains of associated dry cough but denies fevers.  Finally, patient also recalls some associated bilateral lower extremity swelling several days ago but this seems to have improved.  Patient denies sick contacts recent travel or contact with confirmed COVID-19 infection.  Due to patient's progressively worsening symptoms she eventually presented to med [facility] emergency department for evaluation.  Upon evaluation in the emergency department CT angiogram of the chest revealed an acute pulmonary embolism with evidence of right heart strain and RV/LV ratio of 1.12 concerning for submassive pulmonary embolism.  Additionally there were wedge-shaped densities in the right lower lobe suspicious for pulmonary infarction.  ER provider then initiated a heparin infusion.  Hospitalist group was then called and patient was accepted for transfer to  [facility] long hospital for continued medical care.  Review of Systems: Review of Systems  Respiratory:  Positive for shortness of breath.   Cardiovascular:  Positive for chest pain.  All other systems reviewed and are negative.    Past Medical History:  Diagnosis Date   Anginal pain (HCC)    Angio-edema    Arthritis    Atypical chest pain 07/06/2009   Qualifier: Diagnosis of  By: Clifton James, MD, Christopher     GERD (gastroesophageal reflux disease)    Hyperlipemia    Migraines    Toxic effect of venom of bees, unintentional 06/03/2017    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 Her smoking use included cigarettes. She has a 1.25 pack-year smoking history. She has never used smokeless tobacco. She reports current alcohol use of about 1.0 standard drink of alcohol per week. She reports that she does not use drugs.  Allergies  Allergen Reactions   Ciprofloxacin     Can not take due to hx of Cdiff per patient   Wasp Venom Other (See Comments)  dizziness   Fluconazole Rash    Family History  Problem Relation Age of Onset   Prostate cancer Father    Healthy Daughter    Epilepsy Son    Healthy Son    Allergic rhinitis Neg Hx    Angioedema Neg Hx    Asthma Neg Hx    Eczema Neg Hx    Immunodeficiency Neg Hx    Urticaria Neg Hx     Prior to Admission medications    Medication Sig Start Date End Date Taking? Authorizing Provider  aspirin 81 MG EC tablet Take 81 mg by mouth daily. Swallow whole.    [provider]  atorvastatin (LIPITOR) 20 MG tablet Take 1 tablet (20 mg total) by mouth daily after supper. 08/11/18   Mesner, Barbara Cower, MD  carbidopa-levodopa (SINEMET IR) 25-100 MG tablet TAKE 1 AND 1/2 TABLETS BY MOUTH THREE TIMES DAILY 01/15/22   Tat, Octaviano Batty, DO  CRANBERRY PO Take 300 mg daily by mouth.    [provider]  Cyanocobalamin (VITAMIN B-12 PO) Take 1,200 mcg daily by mouth.     [provider]  EPINEPHrine 0.3 mg/0.3 mL IJ SOAJ injection Inject 0.3 mg into the muscle once as needed for up to 1 dose (anaphylaxis). 02/07/21   Nehemiah Settle, FNP  Ergocalciferol (VITAMIN D2) 2000 units TABS Take 2,000 Units daily by mouth.     [provider]  furosemide (LASIX) 20 MG tablet Take 20 mg by mouth 2 (two) times a week. Take 1 tablet  M, F    [provider]  KLOR-CON M10 10 MEQ tablet 2 (two) times a week. Take 1 tablet on M/F 05/11/19   [provider]  omeprazole (PRILOSEC) 40 MG capsule Take 40 mg by mouth continuous as needed.  06/15/18   [provider]  propranolol ER (INDERAL LA) 80 MG 24 hr capsule TAKE 1 CAPSULE(80 MG) BY MOUTH DAILY 05/15/21   Yates Decamp, MD  saccharomyces boulardii (FLORASTOR) 250 MG capsule Take 250 mg by mouth 3 (three) times a week.    [provider]    Physical Exam:  Vitals:   01/19/22 1800 01/19/22 1830 01/19/22 2029 01/20/22 0042  BP: (!) 165/79 (!) 175/84 (!) 152/76 (!) 157/76  Pulse: 70 61 61 60  Resp: (!) 22 (!) 27 19 19   Temp:   (!) 97.5 F (36.4 C) 98 F (36.7 C)  TempSrc:   Oral Oral  SpO2: 98% 99% 98% 98%  Weight:      Height:        Constitutional: Awake alert and oriented x3, no associated distress.   Skin: no rashes, no lesions, good skin turgor noted. Eyes: Pupils are equally reactive to light.  No evidence of scleral icterus  or conjunctival pallor.  ENMT: Moist mucous membranes noted.  Posterior pharynx clear of any exudate or lesions.   Neck: normal, supple, no masses, no thyromegaly.  No evidence of jugular venous distension.   Respiratory: Scattered rhonchi bilaterally otherwise clear to auscultation bilaterally, no wheezing, no crackles. Normal respiratory effort. No accessory muscle use.  Cardiovascular: Regular rate and rhythm, no murmurs / rubs / gallops. No extremity edema. 2+ pedal pulses. No carotid bruits.  Chest:   Nontender without crepitus or deformity.   Back:   Nontender without crepitus or deformity. Abdomen: Abdomen is soft and nontender.  No evidence of intra-abdominal masses.  Positive bowel sounds noted in all quadrants.   Musculoskeletal: No joint deformity upper and lower extremities.  Good ROM, no contractures. Normal muscle tone.  Neurologic: CN 2-12 grossly intact. Sensation intact.  Patient moving all 4 extremities spontaneously.  Patient is following all commands.  Patient is responsive to verbal stimuli.   Psychiatric: Patient exhibits normal mood with appropriate affect.  Patient seems to possess insight as to their current situation.    Data Reviewed:  I have personally reviewed and interpreted labs, imaging.  Significant findings are:  COVID-19 PCR negative Troponin 28 and 31 INR 1.0 D-dimer 1.96 Urinalysis revealing positive nitrites, small leukocyte esterase, many bacteria, greater than 50 white blood cells per high-powered field. Chest x-ray personally reviewed revealing infiltrates in the right middle lobe CT angiogram of the chest revealing bilateral acute pulmonary emboli with evidence of right heart strain and RV/LV ratio of 1.12 concerning for submassive pulmonary embolism.  Additionally there were wedge-shaped densities in the right lower lobe suspicious for pulmonary infarction  EKG: Personally reviewed.  Rhythm is normal sinus rhythm with heart rate of 68 bpm.  No  dynamic ST segment changes appreciated.   Assessment and Plan: * Acute pulmonary embolism (HCC) Patient found to have significant bilateral pulmonary emboli with evidence of right heart strain concerning for submassive pulmonary embolism I am unable to identify what provoked this thromboembolic disease.  No history of recent travel, no recent surgery.  No signs or symptoms of undiagnosed cancer.  Regular mammograms have been unremarkable.  Colonoscopy 3 years ago was unremarkable. Patient experiencing mild oxygen requirement slightly elevated troponin.  Because of this, patient may be a candidate for thrombectomy or thrombolysis.  We will contact pulmonology in the morning for consultation to review as to whether or not patient is a candidate to proceed with this. Echocardiogram to confirm degree of right heart strain ordered  bilateral lower extremity ultrasound ordered to identify any additional clot burden Treating patient with heparin infusion Monitoring patient on telemetry in the progressive unit  Parkinson disease (HCC) Continue home regimen of Sinemet  Elevated troponin level not due myocardial infarction Slightly elevated troponins likely secondary to strain due to pulmonary emboli Trajectory of troponin elevation is flat Patient is currently chest pain-free Patient is unlikely to be suffering from any sort of plaque rupture Continue to monitor closely on telemetry, will perform additional troponin as necessary if patient begins to complain of chest pain  Asymptomatic bacteriuria Urinalysis obtained in the emergency department reveals greater than 50 white blood cells per high-powered field with bacteria However patient denies dysuria abdominal pain or fever making this asymptomatic bacteriuria We will hold off on antibiotics for now unless patient begins to exhibit concerning symptoms  Mixed hyperlipidemia Continuing home regimen of lipid lowering therapy.   GERD without  esophagitis Continuing home regimen of daily PPI therapy.        Code Status:  Full code  code status decision has been confirmed with: Patient Family Communication: deferred   Consults: We will consult pulmonology in the morning for evaluation as to whether thrombolysis/thrombectomy would be indicated.  Severity of Illness:  The appropriate patient status for this patient is INPATIENT. Inpatient status is judged to be reasonable and necessary in order to provide the required intensity of service to ensure the patient's safety. The patient's presenting symptoms, physical exam findings, and initial radiographic and laboratory data in the context of their chronic comorbidities is felt to place them at high risk for further clinical deterioration. Furthermore, it is not anticipated that the patient will be medically stable for discharge from the hospital  within 2 midnights of admission.   * I certify that at the point of admission it is my clinical judgment that the patient will require inpatient hospital care spanning beyond 2 midnights from the point of admission due to high intensity of service, high risk for further deterioration and high frequency of surveillance required.*  Author:  Marinda Elk MD  01/20/2022 2:26 AM

## 2022-01-19 NOTE — Progress Notes (Signed)
ANTICOAGULATION CONSULT NOTE - Initial Consult  Pharmacy Consult for Heparin Indication: atrial fibrillation  Allergies  Allergen Reactions   Ciprofloxacin     Can not take due to hx of Cdiff per patient   Wasp Venom Other (See Comments)    dizziness   Fluconazole Rash    Patient Measurements: Height: 5\' 5"  (165.1 cm) Weight: 70.3 kg (155 lb) IBW/kg (Calculated) : 57 Heparin Dosing Weight: 70.3 kg  Vital Signs: Temp: 97.7 F (36.5 C) (06/17 1213) Temp Source: Oral (06/17 1213) BP: 166/62 (06/17 1630) Pulse Rate: 63 (06/17 1630)  Labs: Recent Labs    01/19/22 1237 01/19/22 1452  HGB 12.3  --   HCT 38.1  --   PLT 142*  --   CREATININE 0.92  --   TROPONINIHS 31* 28*    Estimated Creatinine Clearance: 45.6 mL/min (by C-G formula based on SCr of 0.92 mg/dL).   Medical History: Past Medical History:  Diagnosis Date   Anginal pain (HCC)    Angio-edema    Arthritis    Atypical chest pain 07/06/2009   Qualifier: Diagnosis of  By: 14/09/2008, MD, Christopher     GERD (gastroesophageal reflux disease)    Hyperlipemia    Migraines    Toxic effect of venom of bees, unintentional 06/03/2017    Medications:  (Not in a hospital admission)  Scheduled:  Infusions:  PRN:   Assessment: 24 yof with a history of HLD, thoracic aortic aneurysm, migraine, coronary calcification on CT. Patient is presenting with fatigue, cough, dyspnea, nausea, and chest tightness. Heparin per pharmacy consult placed for pulmonary embolus.  CTA PE w/ acute PE w/ evidence of RHS - stable ascending thoracic aortic aneurysm noted as well  Patient is not on anticoagulation prior to arrival.  Hgb 12.3; plt 142  Goal of Therapy:  Heparin level 0.3-0.7 units/ml Monitor platelets by anticoagulation protocol: Yes   Plan:  Give 4500 units bolus x 1 Start heparin infusion at 1200 units/hr Check anti-Xa level at 0000 and daily while on heparin Continue to monitor H&H and platelets  91, PharmD, BCPS 01/19/2022 4:55 PM ED Clinical Pharmacist -  [phone]

## 2022-01-19 NOTE — ED Notes (Signed)
[facility] at bedside. Pt stable for transport. 

## 2022-01-19 NOTE — ED Notes (Signed)
[facility] en route to transport pt.  

## 2022-01-19 NOTE — ED Provider Notes (Signed)
[facility] EMERGENCY DEPARTMENT Provider Note   CSN: [id number] Arrival date & time: 01/19/22  1203     History  Chief Complaint  Patient presents with   Dizziness    Linda Lee is a 86 y.o. female.  HPI     86yo female with history of hyperlipidemia, thoracic aortic aneurysm, complex migraines, coronary calcification on CT, who presents with concern for fatigue, cough, dyspnea, nausea and chest tightness on exertion since Thursday.  Dyspnea on exertion since Thursday, fatigue Chest pain more on right side and feeling it on right side of back, tightness, pressure, nausea, when walking, then will calm down with rest.  No orthopnea, PND. Standing up feels short of breath and fatigue, just feels like needs to sit down.  Don't feel lightheaded. Coughing started Friday,  Took COVID test and was negative Nose runs all the time anyway, no change, chronic sore throat and saw ENT No fever No leg pain, did have some swelling THursday but improved bilaterally No black or bloody stools Right lower abd pain a little bit but that improved now   Symptoms of dyspnea and chest tightness improve with resting, laying down, but feels it with minimal exertion  No recent surgeries, long trips, hx of DVT/PE/cancer hx     Past Medical History:  Diagnosis Date   Anginal pain (HCC)    Angio-edema    Arthritis    Atypical chest pain 07/06/2009   Qualifier: Diagnosis of  By: Clifton James, MD, Christopher     GERD (gastroesophageal reflux disease)    Hyperlipemia    Migraines    Toxic effect of venom of bees, unintentional 06/03/2017    Home Medications Prior to Admission medications   Medication Sig Start Date End Date Taking? Authorizing Provider  aspirin 81 MG EC tablet Take 81 mg by mouth daily. Swallow whole.    [provider]  atorvastatin (LIPITOR) 20 MG tablet Take 1 tablet (20 mg total) by mouth daily after supper. 08/11/18   Mesner, Barbara Cower, MD   carbidopa-levodopa (SINEMET IR) 25-100 MG tablet TAKE 1 AND 1/2 TABLETS BY MOUTH THREE TIMES DAILY 01/15/22   Tat, Octaviano Batty, DO  CRANBERRY PO Take 300 mg daily by mouth.    [provider]  Cyanocobalamin (VITAMIN B-12 PO) Take 1,200 mcg daily by mouth.     [provider]  EPINEPHrine 0.3 mg/0.3 mL IJ SOAJ injection Inject 0.3 mg into the muscle once as needed for up to 1 dose (anaphylaxis). 02/07/21   Nehemiah Settle, FNP  Ergocalciferol (VITAMIN D2) 2000 units TABS Take 2,000 Units daily by mouth.     [provider]  furosemide (LASIX) 20 MG tablet Take 20 mg by mouth 2 (two) times a week. Take 1 tablet  M, F    [provider]  KLOR-CON M10 10 MEQ tablet 2 (two) times a week. Take 1 tablet on M/F 05/11/19   [provider]  omeprazole (PRILOSEC) 40 MG capsule Take 40 mg by mouth continuous as needed.  06/15/18   [provider]  propranolol ER (INDERAL LA) 80 MG 24 hr capsule TAKE 1 CAPSULE(80 MG) BY MOUTH DAILY 05/15/21   Yates Decamp, MD  saccharomyces boulardii (FLORASTOR) 250 MG capsule Take 250 mg by mouth 3 (three) times a week.    [provider]      Allergies    Ciprofloxacin, Wasp venom, and Fluconazole    Review of Systems   Review of Systems  Physical Exam  Updated Vital Signs BP (!) 160/65   Pulse 61   Temp 97.7 F (36.5 C) (Oral)   Resp (!) 27   Ht 5\' 5"  (1.651 m)   Wt 70.3 kg   SpO2 100%   BMI 25.79 kg/m  Physical Exam Vitals and nursing note reviewed.  Constitutional:      General: She is not in acute distress.    Appearance: She is well-developed. She is not diaphoretic.  HENT:     Head: Normocephalic and atraumatic.  Eyes:     Conjunctiva/sclera: Conjunctivae normal.  Cardiovascular:     Rate and Rhythm: Normal rate and regular rhythm.     Heart sounds: Normal heart sounds. No murmur heard.    No friction rub. No gallop.  Pulmonary:     Effort: Pulmonary effort is normal. No respiratory  distress.     Breath sounds: Normal breath sounds. No wheezing or rales.  Abdominal:     General: There is no distension.     Palpations: Abdomen is soft.     Tenderness: There is no abdominal tenderness. There is no guarding.  Musculoskeletal:        General: No tenderness.     Cervical back: Normal range of motion.  Skin:    General: Skin is warm and dry.     Findings: No erythema or rash.  Neurological:     Mental Status: She is alert and oriented to person, place, and time.     ED Results / Procedures / Treatments   Labs (all labs ordered are listed, but only abnormal results are displayed) Labs Reviewed  BASIC METABOLIC PANEL - Abnormal; Notable for the following components:      Result Value   Glucose, Bld 109 (*)    Calcium 8.5 (*)    All other components within normal limits  CBC - Abnormal; Notable for the following components:   Platelets 142 (*)    All other components within normal limits  URINALYSIS, ROUTINE W REFLEX MICROSCOPIC - Abnormal; Notable for the following components:   APPearance HAZY (*)    Ketones, ur 15 (*)    Protein, ur 30 (*)    Nitrite POSITIVE (*)    Leukocytes,Ua SMALL (*)    All other components within normal limits  HEPATIC FUNCTION PANEL - Abnormal; Notable for the following components:   Total Protein 6.4 (*)    Albumin 3.4 (*)    AST 12 (*)    All other components within normal limits  D-DIMER, QUANTITATIVE (NOT AT [facility]) - Abnormal; Notable for the following components:   D-Dimer, Quant 1.96 (*)    All other components within normal limits  URINALYSIS, MICROSCOPIC (REFLEX) - Abnormal; Notable for the following components:   Bacteria, UA MANY (*)    All other components within normal limits  CBG MONITORING, ED - Abnormal; Notable for the following components:   Glucose-Capillary 102 (*)    All other components within normal limits  TROPONIN I (HIGH SENSITIVITY) - Abnormal; Notable for the following components:   Troponin I (High  Sensitivity) 31 (*)    All other components within normal limits  TROPONIN I (HIGH SENSITIVITY) - Abnormal; Notable for the following components:   Troponin I (High Sensitivity) 28 (*)    All other components within normal limits  CULTURE, BLOOD (ROUTINE X 2)  CULTURE, BLOOD (ROUTINE X 2)  SARS CORONAVIRUS 2 BY RT PCR  LACTIC ACID, PLASMA    EKG EKG Interpretation  Date/Time:  Saturday January 19 2022 12:19:05 EDT Ventricular Rate:  68 PR Interval:  150 QRS Duration: 68 QT Interval:  422 QTC Calculation: 448 R Axis:   22 Text Interpretation: Normal sinus rhythm Cannot rule out Anterior infarct , age undetermined Abnormal ECG When compared with ECG of 15-Sep-2015 11:42, No significant change since last tracing , baseline wander Confirmed by Alvira Monday ([id number]) on 01/19/2022 12:28:22 PM  Radiology DG Chest 2 View  Result Date: 01/19/2022 CLINICAL DATA:  86 year old female with history of shortness of breath. EXAM: CHEST - 2 VIEW COMPARISON:  Chest x-ray 08/05/2017. FINDINGS: Ill-defined opacity in the medial segment of the right middle lobe concerning for probable bronchopneumonia. Lung volumes are normal. No pleural effusions. No pneumothorax. No pulmonary nodule or mass noted. Pulmonary vasculature and the cardiomediastinal silhouette are within normal limits. Atherosclerotic calcifications in the thoracic aorta. IMPRESSION: 1. Findings are concerning for bronchopneumonia in the medial segment of the right middle lobe. Followup PA and lateral chest X-ray is recommended in 3-4 weeks following trial of antibiotic therapy to ensure resolution and exclude underlying malignancy. 2. Aortic atherosclerosis. Electronically Signed   By: Trudie Reed M.D.   On: 01/19/2022 13:31    Procedures Procedures    Medications Ordered in ED Medications  iohexol (OMNIPAQUE) 350 MG/ML injection 100 mL (75 mLs Intravenous Contrast Given 01/19/22 1543)  cefTRIAXone (ROCEPHIN) 1 g in sodium chloride  0.9 % 100 mL IVPB (0 g Intravenous Stopped 01/19/22 1452)  azithromycin (ZITHROMAX) 500 mg in sodium chloride 0.9 % 250 mL IVPB (0 mg Intravenous Stopped 01/19/22 1615)    ED Course/ Medical Decision Making/ A&P                           Medical Decision Making Amount and/or Complexity of Data Reviewed Labs: ordered. Radiology: ordered.  Risk Prescription drug management. Decision regarding hospitalization.   86yo female with history of hyperlipidemia, thoracic aortic aneurysm, complex migraines, coronary calcification on CT, who presents with concern for fatigue, cough, dyspnea, nausea and chest tightness on exertion since Thursday.  Differential diagnosis for dyspnea includes ACS, PE, COPD exacerbation, CHF exacerbation, anemia, pneumonia, viral etiology such as COVID 19 infection, metabolic abnormality.  Chest x-ray was done which showed concern for bronchopneumonia. EKG was evaluated by me which showed baseline wander but sinus rhythm without significant abnormalities. Troponin 31--may be secondary to stress related to pneumonia, r/o PE, r/o NSTEMI.    Oxygen desaturated in the ED to 86% and she was placed on O2.    Given rocephin and azithromycin for CAP.  UA also consistent with UTI.  Ddimer/CT PE study pending at time of transfer of care. Plan for admission for continued care.        Final Clinical Impression(s) / ED Diagnoses Final diagnoses:  Acute respiratory failure with hypoxia (HCC)  Other acute pulmonary embolism, unspecified whether acute cor pulmonale present (HCC)  Troponin level elevated  Urinary tract infection without hematuria, site unspecified    Rx / DC Orders ED Discharge Orders     None         Alvira Monday, MD 01/19/22 1627

## 2022-01-19 NOTE — ED Triage Notes (Signed)
Pt arrives pov, to triage in wheelchair, c/o feeling "light headed with movement" and nausea x 2 days. VAN neg. AOx4. Pt drove to ED. Denies HA today

## 2022-01-20 ENCOUNTER — Telehealth: Payer: Self-pay | Admitting: Internal Medicine

## 2022-01-20 ENCOUNTER — Inpatient Hospital Stay (HOSPITAL_COMMUNITY): Payer: Medicare PPO

## 2022-01-20 DIAGNOSIS — I2699 Other pulmonary embolism without acute cor pulmonale: Secondary | ICD-10-CM | POA: Diagnosis not present

## 2022-01-20 LAB — HEPARIN LEVEL (UNFRACTIONATED)
Heparin Unfractionated: 0.59 IU/mL (ref 0.30–0.70)
Heparin Unfractionated: 0.64 IU/mL (ref 0.30–0.70)
Heparin Unfractionated: 0.75 IU/mL — ABNORMAL HIGH (ref 0.30–0.70)

## 2022-01-20 LAB — COMPREHENSIVE METABOLIC PANEL
ALT: <5 U/L (ref 0–44)
AST: 9 U/L — ABNORMAL LOW (ref 15–41)
Albumin: 3.2 g/dL — ABNORMAL LOW (ref 3.5–5.0)
Alkaline Phosphatase: 86 U/L (ref 38–126)
Anion gap: 8 (ref 5–15)
BUN: 18 mg/dL (ref 8–23)
CO2: 25 mmol/L (ref 22–32)
Calcium: 8.1 mg/dL — ABNORMAL LOW (ref 8.9–10.3)
Chloride: 110 mmol/L (ref 98–111)
Creatinine, Ser: 0.8 mg/dL (ref 0.44–1.00)
GFR, Estimated: >60 mL/min (ref >60)
Glucose, Bld: 95 mg/dL (ref 70–99)
Potassium: 3.6 mmol/L (ref 3.5–5.1)
Sodium: 143 mmol/L (ref 135–145)
Total Bilirubin: 0.9 mg/dL (ref 0.3–1.2)
Total Protein: 5.9 g/dL — ABNORMAL LOW (ref 6.5–8.1)

## 2022-01-20 LAB — PROCALCITONIN: Procalcitonin: <0.1 ng/mL

## 2022-01-20 LAB — CBC WITH DIFFERENTIAL/PLATELET
Abs Immature Granulocytes: 0.01 10*3/uL (ref 0.00–0.07)
Basophils Absolute: 0 10*3/uL (ref 0.0–0.1)
Basophils Relative: 1 %
Eosinophils Absolute: 0.1 10*3/uL (ref 0.0–0.5)
Eosinophils Relative: 2 %
HCT: 34.1 % — ABNORMAL LOW (ref 36.0–46.0)
Hemoglobin: 10.9 g/dL — ABNORMAL LOW (ref 12.0–15.0)
Immature Granulocytes: 0 %
Lymphocytes Relative: 19 %
Lymphs Abs: 0.8 10*3/uL (ref 0.7–4.0)
MCH: 30.9 pg (ref 26.0–34.0)
MCHC: 32 g/dL (ref 30.0–36.0)
MCV: 96.6 fL (ref 80.0–100.0)
Monocytes Absolute: 0.3 10*3/uL (ref 0.1–1.0)
Monocytes Relative: 8 %
Neutro Abs: 2.9 10*3/uL (ref 1.7–7.7)
Neutrophils Relative %: 70 %
Platelets: 122 10*3/uL — ABNORMAL LOW (ref 150–400)
RBC: 3.53 MIL/uL — ABNORMAL LOW (ref 3.87–5.11)
RDW: 12.7 % (ref 11.5–15.5)
WBC: 4.2 10*3/uL (ref 4.0–10.5)
nRBC: 0 % (ref 0.0–0.2)

## 2022-01-20 LAB — ECHOCARDIOGRAM COMPLETE
Area-P 1/2: 2.87 cm2
Height: 65 in
P 1/2 time: 533 msec
S' Lateral: 3.6 cm
Weight: 2480 oz

## 2022-01-20 LAB — TROPONIN I (HIGH SENSITIVITY): Troponin I (High Sensitivity): 13 ng/L (ref <18)

## 2022-01-20 LAB — MAGNESIUM: Magnesium: 2.1 mg/dL (ref 1.7–2.4)

## 2022-01-20 LAB — BRAIN NATRIURETIC PEPTIDE: B Natriuretic Peptide: 204 pg/mL — ABNORMAL HIGH (ref 0.0–100.0)

## 2022-01-20 LAB — LACTIC ACID, PLASMA: Lactic Acid, Venous: 0.7 mmol/L (ref 0.5–1.9)

## 2022-01-20 LAB — D-DIMER, QUANTITATIVE: D-Dimer, Quant: 1.47 ug/mL-FEU — ABNORMAL HIGH (ref 0.00–0.50)

## 2022-01-20 MED ORDER — ONDANSETRON HCL 4 MG PO TABS: 4.0000 mg | ORAL_TABLET | Freq: Four times a day (QID) | ORAL | Status: DC | PRN

## 2022-01-20 MED ORDER — FUROSEMIDE 20 MG PO TABS: 20.0000 mg | ORAL_TABLET | ORAL | Status: DC

## 2022-01-20 MED ORDER — PANTOPRAZOLE SODIUM 40 MG PO TBEC
40.0000 mg | DELAYED_RELEASE_TABLET | Freq: Every day | ORAL | Status: DC
  Administered 2022-01-20 – 2022-01-22 (×3): 40 mg via ORAL
  Filled 2022-01-20 (×3): qty 1

## 2022-01-20 MED ORDER — ATORVASTATIN CALCIUM 20 MG PO TABS
20.0000 mg | ORAL_TABLET | Freq: Every day | ORAL | Status: DC
  Administered 2022-01-20 – 2022-01-21 (×2): 20 mg via ORAL
  Filled 2022-01-20 (×2): qty 1

## 2022-01-20 MED ORDER — ACETAMINOPHEN 650 MG RE SUPP: 650.0000 mg | Freq: Four times a day (QID) | RECTAL | Status: DC | PRN

## 2022-01-20 MED ORDER — PROPRANOLOL HCL ER 80 MG PO CP24
80.0000 mg | ORAL_CAPSULE | Freq: Every day | ORAL | Status: DC
  Administered 2022-01-20 – 2022-01-22 (×3): 80 mg via ORAL
  Filled 2022-01-20 (×3): qty 1

## 2022-01-20 MED ORDER — FUROSEMIDE 20 MG PO TABS
20.0000 mg | ORAL_TABLET | ORAL | Status: DC
  Administered 2022-01-21: 20 mg via ORAL
  Filled 2022-01-20: qty 1

## 2022-01-20 MED ORDER — ACETAMINOPHEN 325 MG PO TABS: 650.0000 mg | ORAL_TABLET | Freq: Four times a day (QID) | ORAL | Status: DC | PRN

## 2022-01-20 MED ORDER — ASPIRIN 81 MG PO TBEC
81.0000 mg | DELAYED_RELEASE_TABLET | Freq: Every day | ORAL | Status: DC
  Administered 2022-01-20 – 2022-01-21 (×2): 81 mg via ORAL
  Filled 2022-01-20 (×3): qty 1

## 2022-01-20 MED ORDER — ONDANSETRON HCL 4 MG/2ML IJ SOLN: 4.0000 mg | Freq: Four times a day (QID) | INTRAMUSCULAR | Status: DC | PRN

## 2022-01-20 MED ORDER — CARBIDOPA-LEVODOPA 25-100 MG PO TABS
1.5000 | ORAL_TABLET | Freq: Three times a day (TID) | ORAL | Status: DC
  Administered 2022-01-20 – 2022-01-22 (×8): 1.5 via ORAL
  Filled 2022-01-20 (×9): qty 2

## 2022-01-20 MED ORDER — POLYETHYLENE GLYCOL 3350 17 G PO PACK
17.0000 g | PACK | Freq: Every day | ORAL | Status: DC | PRN
Start: 2022-01-20 — End: 2022-01-22

## 2022-01-20 NOTE — Progress Notes (Signed)
ANTICOAGULATION CONSULT NOTE follow up  Pharmacy Consult for Heparin Indication: atrial fibrillation  Allergies  Allergen Reactions   Ciprofloxacin     Can not take due to hx of Cdiff per patient   Wasp Venom Other (See Comments)    dizziness   Fluconazole Rash    Patient Measurements: Height: 5\' 5"  (165.1 cm) Weight: 70.3 kg (155 lb) IBW/kg (Calculated) : 57 Heparin Dosing Weight: 70.3 kg  Vital Signs: Temp: 98 F (36.7 C) (06/18 0042) Temp Source: Oral (06/18 0042) BP: 157/76 (06/18 0042) Pulse Rate: 60 (06/18 0042)  Labs: Recent Labs    01/19/22 1237 01/19/22 1320 01/19/22 1452 01/19/22 2355  HGB 12.3  --   --   --   HCT 38.1  --   --   --   PLT 142*  --   --   --   LABPROT  --  13.5  --   --   INR  --  1.0  --   --   HEPARINUNFRC  --   --   --  0.64  CREATININE 0.92  --   --   --   TROPONINIHS 31*  --  28*  --      Estimated Creatinine Clearance: 45.6 mL/min (by C-G formula based on SCr of 0.92 mg/dL).   Medical History: Past Medical History:  Diagnosis Date   Anginal pain (HCC)    Angio-edema    Arthritis    Atypical chest pain 07/06/2009   Qualifier: Diagnosis of  By: 14/09/2008, MD, Christopher     GERD (gastroesophageal reflux disease)    Hyperlipemia    Migraines    Toxic effect of venom of bees, unintentional 06/03/2017    Medications:  Medications Prior to Admission  Medication Sig Dispense Refill Last Dose   aspirin 81 MG EC tablet Take 81 mg by mouth daily. Swallow whole.      atorvastatin (LIPITOR) 20 MG tablet Take 1 tablet (20 mg total) by mouth daily after supper. 30 tablet 0    carbidopa-levodopa (SINEMET IR) 25-100 MG tablet TAKE 1 AND 1/2 TABLETS BY MOUTH THREE TIMES DAILY 270 tablet 0    CRANBERRY PO Take 300 mg daily by mouth.      Cyanocobalamin (VITAMIN B-12 PO) Take 1,200 mcg daily by mouth.       EPINEPHrine 0.3 mg/0.3 mL IJ SOAJ injection Inject 0.3 mg into the muscle once as needed for up to 1 dose (anaphylaxis). 2 each 1     Ergocalciferol (VITAMIN D2) 2000 units TABS Take 2,000 Units daily by mouth.       furosemide (LASIX) 20 MG tablet Take 20 mg by mouth 2 (two) times a week. Take 1 tablet  M, F      KLOR-CON M10 10 MEQ tablet 2 (two) times a week. Take 1 tablet on M/F      omeprazole (PRILOSEC) 40 MG capsule Take 40 mg by mouth continuous as needed.       propranolol ER (INDERAL LA) 80 MG 24 hr capsule TAKE 1 CAPSULE(80 MG) BY MOUTH DAILY 90 capsule 3    saccharomyces boulardii (FLORASTOR) 250 MG capsule Take 250 mg by mouth 3 (three) times a week.       Scheduled:  Infusions:  PRN:   Assessment: 89 yof with a history of HLD, thoracic aortic aneurysm, migraine, coronary calcification on CT. Patient is presenting with fatigue, cough, dyspnea, nausea, and chest tightness. Heparin per pharmacy consult placed for pulmonary embolus.  CTA PE w/ acute PE w/ evidence of RHS - stable ascending thoracic aortic aneurysm noted as well  Patient is not on anticoagulation prior to arrival.  Hgb 12.3; plt 142  01/20/2022 HL 0.64 therapeutic on 1200 units/hr No bleeding noted  Goal of Therapy:  Heparin level 0.3-0.7 units/ml Monitor platelets by anticoagulation protocol: Yes   Plan:  Continue heparin infusion at 1200 units/hr Confirmatory heparin level in 8 hours Daily CBC Continue to monitor H&H and platelets  Arley Phenix RPh 01/20/2022, 12:50 AM

## 2022-01-20 NOTE — Assessment & Plan Note (Signed)
   Urinalysis obtained in the emergency department reveals greater than 50 white blood cells per high-powered field with bacteria  However patient denies dysuria abdominal pain or fever making this asymptomatic bacteriuria  We will hold off on antibiotics for now unless patient begins to exhibit concerning symptoms

## 2022-01-20 NOTE — Progress Notes (Addendum)
ANTICOAGULATION CONSULT NOTE follow up  Pharmacy Consult for Heparin Indication: pulmonary embolism  Allergies  Allergen Reactions   Ciprofloxacin     Can not take due to hx of Cdiff per patient   Wasp Venom Other (See Comments)    dizziness   Fluconazole Rash    Patient Measurements: Height: 5\' 5"  (165.1 cm) Weight: 70.3 kg (155 lb) IBW/kg (Calculated) : 57 Heparin Dosing Weight: 70.3 kg  Vital Signs: Temp: 97.6 F (36.4 C) (06/18 0617) Temp Source: Oral (06/18 0617) BP: 154/70 (06/18 0812) Pulse Rate: 70 (06/18 0617)  Labs: Recent Labs    01/19/22 1237 01/19/22 1320 01/19/22 1452 01/19/22 2355  HGB 12.3  --   --   --   HCT 38.1  --   --   --   PLT 142*  --   --   --   LABPROT  --  13.5  --   --   INR  --  1.0  --   --   HEPARINUNFRC  --   --   --  0.64  CREATININE 0.92  --   --   --   TROPONINIHS 31*  --  28*  --      Estimated Creatinine Clearance: 45.6 mL/min (by C-G formula based on SCr of 0.92 mg/dL).   Medical History: Past Medical History:  Diagnosis Date   Anginal pain (HCC)    Angio-edema    Arthritis    Atypical chest pain 07/06/2009   Qualifier: Diagnosis of  By: 14/09/2008, MD, Christopher     GERD (gastroesophageal reflux disease)    Hyperlipemia    Migraines    Toxic effect of venom of bees, unintentional 06/03/2017    Medications:  Medications Prior to Admission  Medication Sig Dispense Refill Last Dose   aspirin 81 MG EC tablet Take 81 mg by mouth daily. Swallow whole.   Past Week   atorvastatin (LIPITOR) 20 MG tablet Take 1 tablet (20 mg total) by mouth daily after supper. 30 tablet 0 Past Week   carbidopa-levodopa (SINEMET IR) 25-100 MG tablet TAKE 1 AND 1/2 TABLETS BY MOUTH THREE TIMES DAILY 270 tablet 0 01/19/2022   CRANBERRY PO Take 300 mg daily by mouth.   Past Week   Cyanocobalamin (VITAMIN B-12 PO) Take 1,200 mcg daily by mouth.    Past Week   EPINEPHrine 0.3 mg/0.3 mL IJ SOAJ injection Inject 0.3 mg into the muscle once as  needed for up to 1 dose (anaphylaxis). 2 each 1 unk   Ergocalciferol (VITAMIN D2) 2000 units TABS Take 2,000 Units daily by mouth.    Past Week   furosemide (LASIX) 20 MG tablet Take 20 mg by mouth 2 (two) times a week. Take 1 tablet  M, F   Past Week   KLOR-CON M10 10 MEQ tablet 10 mEq 2 (two) times a week.   Past Week   omeprazole (PRILOSEC) 40 MG capsule Take 40 mg by mouth continuous as needed (indigestion).   Past Month   propranolol ER (INDERAL LA) 80 MG 24 hr capsule TAKE 1 CAPSULE(80 MG) BY MOUTH DAILY (Patient taking differently: Take 80 mg by mouth daily.) 90 capsule 3 Past Week   saccharomyces boulardii (FLORASTOR) 250 MG capsule Take 250 mg by mouth 3 (three) times a week.   Past Week    Assessment: 12 yof with a history of HLD, thoracic aortic aneurysm, migraine, coronary calcification on CT. Patient is presenting with fatigue, cough, dyspnea, nausea, and chest tightness.  Heparin per pharmacy consult placed for pulmonary embolus.  CTA PE w/ acute PE w/ evidence of RHS - stable ascending thoracic aortic aneurysm noted as well  Patient is not on anticoagulation prior to arrival.  Hgb 12.3; plt 142  Today, 01/20/2022: - Heparin level slightly supratherapeutic at 0.75 on heparin 1200 units/hr - Hgb 12.3 >> 10.9, platelets 142 >> 122 - No bleeding reported  Goal of Therapy:  Heparin level 0.3-0.7 units/ml Monitor platelets by anticoagulation protocol: Yes   Plan:  Reduce heparin drip to 1100 units/hr Recheck heparin level in 8 hours Monitor heparin level and CBC daily  Rexford Maus, PharmD 01/20/2022 8:20 AM  PM UPDATE: - Heparin level drawn ~3 hours early therapeutic at 0.59 after heparin rate reduced. - Continue heparin drip at 1100 units/hr - Monitor heparin level daily with AM labs  Rexford Maus, PharmD 01/20/2022 4:42 PM

## 2022-01-20 NOTE — Progress Notes (Signed)
PROGRESS NOTE    Linda Lee  MOQ:[id number] DOB: 1935-11-05 DOA: 01/19/2022 PCP: Rodrigo Ran, MD  Brief Narrative:  86 year old female with past medical history of Parkinson disease, lumbar spinal stenosis, sialorrhea,ascending thoracic aortic aneurysm (4.5cm), complex migraine headaches, hyperlipidemia, gastroesophageal reflux disease who presents to med [facility] emergency department complaining of dyspnea on exertion, chest discomfort and nausea.   Patient explains that for approximately the past 2 to 3 days she has been experiencing shortness of breath.  Shortness of breath is worse with exertion and improved with rest.  As the days progressed patient's shortness of breath became associated with chest discomfort.  Patient describes this chest discomfort is right-sided, tight in quality and moderate in intensity.  Discomfort seems to occur more with exertion and improved with rest.  Patient complains of associated dry cough but denies fevers.  Finally, patient also recalls some associated bilateral lower extremity swelling several days ago but this seems to have improved.  Patient denies sick contacts recent travel or contact with confirmed COVID-19 infection   Upon evaluation in the emergency department CT angiogram of the chest revealed an acute pulmonary embolism with evidence of right heart strain and RV/LV ratio of 1.12 concerning for submassive pulmonary embolism.  Additionally there were wedge-shaped densities in the right lower lobe suspicious for pulmonary infarction.  ER provider then initiated a heparin infusion.  Hospitalist group was then called and patient was accepted for transfer to [facility] long hospital for continued medical care. She has a history of C. difficile in 2018 from that time she was on ciprofloxacin COVID-negative this admission Assessment & Plan:   Principal Problem:   Acute pulmonary embolism (HCC) Active Problems:   Parkinson disease (HCC)    Elevated troponin level not due myocardial infarction   Asymptomatic bacteriuria   Mixed hyperlipidemia   GERD without esophagitis   #1 acute bilateral pulmonary embolism unclear as to what provoked this.  She has no recent history of travel malignancy COVID.  She was on 2 L of oxygen on admission and currently she is on room air and has been started on heparin drip since admission.  Echo done results are pending. Venous Doppler ordered. Appreciate PCCM input.  Will continue heparin for 48 to 72 hours. Then switch to oral anticoagulation with Eliquis. BNP 204 Troponin 28 and 13 Lactic acid 1.1 and 0.7 D-dimer 1.47 Pro-Cal pending WBC normal on admission.  She received IV antibiotics in med [facility] ER which has not been continued. Patient reports she has not been very active due to back pain and knee pains  #2 history of Parkinson's disease on Sinemet  #3 asymptomatic bacteriuria she denies any abdominal pain or dysuria or fever.  We will hold off on antibiotics.  #4 hyperlipidemia  #5 GERD  #6 elevated troponin likely from demand ischemia   Estimated body mass index is 25.79 kg/m as calculated from the following:   Height as of this encounter: 5\' 5"  (1.651 m).   Weight as of this encounter: 70.3 kg.  DVT prophylaxis: Heparin  code Status: Full code  family Communication: None at bedside  disposition Plan:  Status is: Inpatient Remains inpatient appropriate because: Acute bilateral pulmonary embolism   Consultants:  PCCM  Procedures: None  antimicrobials: None  Subjective: Patient is resting in bed she feels better than yesterday has been on heparin since admission.  Objective: Vitals:   01/20/22 0812 01/20/22 0828 01/20/22 1150 01/20/22 1402  BP: (!) 154/70 (!) 156/78 01/22/22)  154/69   Pulse:  66 (!) 58   Resp:  17    Temp:  97.6 F (36.4 C) 97.6 F (36.4 C)   TempSrc:   Oral   SpO2:  98% 99% 94%  Weight:      Height:        Intake/Output Summary  (Last 24 hours) at 01/20/2022 1445 Last data filed at 01/20/2022 0600 Gross per 24 hour  Intake 543.03 ml  Output --  Net 543.03 ml   Filed Weights   01/19/22 1210  Weight: 70.3 kg    Examination:  General exam: Appears in no acute distress Respiratory system: Rhonchi to auscultation. Respiratory effort normal. Cardiovascular system: S1 & S2 heard, RRR. No JVD, murmurs, rubs, gallops or clicks. No pedal edema. Gastrointestinal system: Abdomen is nondistended, soft and nontender. No organomegaly or masses felt. Normal bowel sounds heard. Central nervous system: Alert and oriented. No focal neurological deficits. Extremities: Symmetric 5 x 5 power. Skin: No rashes, lesions or ulcers Psychiatry: Judgement and insight appear normal. Mood & affect appropriate.     Data Reviewed: I have personally reviewed following labs and imaging studies  CBC: Recent Labs  Lab 01/19/22 1237 01/20/22 0750  WBC 5.2 4.2  NEUTROABS  --  2.9  HGB 12.3 10.9*  HCT 38.1 34.1*  MCV 95.5 96.6  PLT 142* 122*   Basic Metabolic Panel: Recent Labs  Lab 01/19/22 1237 01/20/22 0750  NA 142 143  K 4.1 3.6  CL 109 110  CO2 25 25  GLUCOSE 109* 95  BUN 21 18  CREATININE 0.92 0.80  CALCIUM 8.5* 8.1*  MG  --  2.1   GFR: Estimated Creatinine Clearance: 52.4 mL/min (by C-G formula based on SCr of 0.8 mg/dL). Liver Function Tests: Recent Labs  Lab 01/19/22 1237 01/20/22 0750  AST 12* 9*  ALT <5 <5  ALKPHOS 100 86  BILITOT 0.8 0.9  PROT 6.4* 5.9*  ALBUMIN 3.4* 3.2*   No results for input(s): "LIPASE", "AMYLASE" in the last 168 hours. No results for input(s): "AMMONIA" in the last 168 hours. Coagulation Profile: Recent Labs  Lab 01/19/22 1320  INR 1.0   Cardiac Enzymes: No results for input(s): "CKTOTAL", "CKMB", "CKMBINDEX", "TROPONINI" in the last 168 hours. BNP (last 3 results) No results for input(s): "PROBNP" in the last 8760 hours. HbA1C: No results for input(s): "HGBA1C" in the  last 72 hours. CBG: Recent Labs  Lab 01/19/22 1219  GLUCAP 102*   Lipid Profile: No results for input(s): "CHOL", "HDL", "LDLCALC", "TRIG", "CHOLHDL", "LDLDIRECT" in the last 72 hours. Thyroid Function Tests: No results for input(s): "TSH", "T4TOTAL", "FREET4", "T3FREE", "THYROIDAB" in the last 72 hours. Anemia Panel: No results for input(s): "VITAMINB12", "FOLATE", "FERRITIN", "TIBC", "IRON", "RETICCTPCT" in the last 72 hours. Sepsis Labs: Recent Labs  Lab 01/19/22 1356 01/20/22 1229  LATICACIDVEN 1.1 0.7    Recent Results (from the past 240 hour(s))  Blood culture (routine x 2)     Status: None (Preliminary result)   Collection Time: 01/19/22  1:56 PM   Specimen: Right Antecubital; Blood  Result Value Ref Range Status   Specimen Description   Final    RIGHT ANTECUBITAL BLOOD Performed at [facility], [address] [place], [place] [address]    Special Requests   Final    Blood Culture adequate volume BOTTLES DRAWN AEROBIC AND ANAEROBIC Performed at [facility], [address], [place], [place] [address]    Culture  Final    NO GROWTH < 24 HOURS Performed at [facility], [address], [place], [place] [address]    Report Status PENDING  Incomplete  Blood culture (routine x 2)     Status: None (Preliminary result)   Collection Time: 01/19/22  1:58 PM   Specimen: BLOOD RIGHT ARM  Result Value Ref Range Status   Specimen Description   Final    BLOOD RIGHT ARM BLOOD Performed at [facility], [address] [place], [place] [address]    Special Requests   Final    Blood Culture adequate volume BOTTLES DRAWN AEROBIC AND ANAEROBIC Performed at [facility], [address] [place], [place] [address]    Culture   Final    NO GROWTH < 24 HOURS Performed at [facility], [address], [place], [place] [address]    Report Status PENDING  Incomplete  SARS Coronavirus 2 by RT PCR (hospital order, performed  in [facility] hospital lab) *cepheid single result test* Anterior Nasal Swab     Status: None   Collection Time: 01/19/22  4:21 PM   Specimen: Anterior Nasal Swab  Result Value Ref Range Status   SARS Coronavirus 2 by RT PCR NEGATIVE NEGATIVE Final    Comment: (NOTE) SARS-CoV-2 target nucleic acids are NOT DETECTED.  The SARS-CoV-2 RNA is generally detectable in upper and lower respiratory specimens during the acute phase of infection. The lowest concentration of SARS-CoV-2 viral copies this assay can detect is 250 copies / mL. A negative result does not preclude SARS-CoV-2 infection and should not be used as the sole basis for treatment or other patient management decisions.  A negative result may occur with improper specimen collection / handling, submission of specimen other than nasopharyngeal swab, presence of viral mutation(s) within the areas targeted by this assay, and inadequate number of viral copies (<250 copies / mL). A negative result must be combined with clinical observations, patient history, and epidemiological information.  Fact Sheet for Patients:   RoadLapTop.co.za  Fact Sheet for Healthcare Providers: http://kim-miller.com/  This test is not yet approved or  cleared by the [place] FDA and has been authorized for detection and/or diagnosis of SARS-CoV-2 by FDA under an Emergency Use Authorization (EUA).  This EUA will remain in effect (meaning this test can be used) for the duration of the COVID-19 declaration under Section 564(b)(1) of the Act, 21 U.S.C. section 360bbb-3(b)(1), unless the authorization is terminated or revoked sooner.  Performed at [facility], [address], [place], [place] [address]          Radiology Studies: CT Angio Chest PE W and/or Wo Contrast  Result Date: 01/19/2022 CLINICAL DATA:  Lightheadedness and nausea for the past 2 days. EXAM: CT ANGIOGRAPHY CHEST WITH  CONTRAST TECHNIQUE: Multidetector CT imaging of the chest was performed using the standard protocol during bolus administration of intravenous contrast. Multiplanar CT image reconstructions and MIPs were obtained to evaluate the vascular anatomy. RADIATION DOSE REDUCTION: This exam was performed according to the departmental dose-optimization program which includes automated exposure control, adjustment of the mA and/or kV according to patient size and/or use of iterative reconstruction technique. CONTRAST:  11mL OMNIPAQUE IOHEXOL 350 MG/ML SOLN COMPARISON:  Chest radiographs obtained earlier today. Chest CTA dated 09/25/2021. FINDINGS: Cardiovascular: Multiple bilateral pulmonary arterial filling defects, including the distal right main pulmonary artery. The right ventricular to left ventricular ratio is 1.12. Interval increase  in transverse diameter of the main pulmonary artery, currently measuring 3.5 cm, previously 3.2 cm. 4.3 cm ascending thoracic aortic aneurysm, previously shown to be stable for 10 years. Therefore, this does not need further follow-up unless clinically indicated. Atheromatous calcifications, including the coronary arteries and aorta. Mediastinum/Nodes: No enlarged mediastinal, hilar, or axillary lymph nodes. Thyroid gland, trachea, and esophagus demonstrate no significant findings. Small hiatal hernia. Lungs/Pleura: Minimal bilateral dependent atelectasis. Interval wedge-shaped area of consolidation in the anteromedial aspect of the right lower lobe with an air bronchogram. Interval minimal irregular patchy density more superiorly and laterally in the right lower lobe. Upper Abdomen: Stable liver cysts. Musculoskeletal: Mild thoracic and upper lumbar spine degenerative changes. Review of the MIP images confirms the above findings. IMPRESSION: 1. Positive for acute PE with CT evidence of right heart strain (RV/LV Ratio = 1.12) consistent with at least submassive (intermediate risk) PE. The  presence of right heart strain has been associated with an increased risk of morbidity and mortality. Please refer to the "Code PE Focused" order set in EPIC. 2. Interval wedge-shaped and patchy densities in the right lower lobe, suspicious for pulmonary infarction. 3. Interval increased enlargement of the central pulmonary arteries, compatible with pulmonary arterial hypertension associated with the pulmonary emboli. 4.  Calcific coronary artery and aortic atherosclerosis. 5. Stable 4.3 cm ascending thoracic aortic aneurysm, not needing further follow-up unless otherwise clinically indicated. Critical Value/emergent results were called by telephone at the time of interpretation on 01/19/2022 at 4:19 pm to provider Leodis Sias, MD, who verbally acknowledged these results. Aortic Atherosclerosis (ICD10-I70.0). Electronically Signed   By: Claudie Revering M.D.   On: 01/19/2022 16:25   DG Chest 2 View  Result Date: 01/19/2022 CLINICAL DATA:  86 year old female with history of shortness of breath. EXAM: CHEST - 2 VIEW COMPARISON:  Chest x-ray 08/05/2017. FINDINGS: Ill-defined opacity in the medial segment of the right middle lobe concerning for probable bronchopneumonia. Lung volumes are normal. No pleural effusions. No pneumothorax. No pulmonary nodule or mass noted. Pulmonary vasculature and the cardiomediastinal silhouette are within normal limits. Atherosclerotic calcifications in the thoracic aorta. IMPRESSION: 1. Findings are concerning for bronchopneumonia in the medial segment of the right middle lobe. Followup PA and lateral chest X-ray is recommended in 3-4 weeks following trial of antibiotic therapy to ensure resolution and exclude underlying malignancy. 2. Aortic atherosclerosis. Electronically Signed   By: Vinnie Langton M.D.   On: 01/19/2022 13:31        Scheduled Meds:  aspirin EC  81 mg Oral Daily   atorvastatin  20 mg Oral QPC supper   carbidopa-levodopa  1.5 tablet Oral TID   [START ON  01/21/2022] furosemide  20 mg Oral Once per day on Mon Fri   pantoprazole  40 mg Oral Daily   propranolol ER  80 mg Oral Daily   Continuous Infusions:  heparin 1,100 Units/hr (01/20/22 0935)     LOS: 1 day    Time spent: 52 min  Georgette Shell, MD 01/20/2022, 2:45 PM

## 2022-01-20 NOTE — Assessment & Plan Note (Addendum)
   Patient found to have significant bilateral pulmonary emboli with evidence of right heart strain concerning for submassive pulmonary embolism  I am unable to identify what provoked this thromboembolic disease.  No history of recent travel, no recent surgery.  No signs or symptoms of undiagnosed cancer.  Regular mammograms have been unremarkable.  Colonoscopy 3 years ago was unremarkable.  Patient experiencing mild oxygen requirement slightly elevated troponin.  Because of this, patient may be a candidate for thrombectomy or thrombolysis.  We will contact pulmonology in the morning for consultation to review as to whether or not patient is a candidate to proceed with this.  Echocardiogram to confirm degree of right heart strain ordered   bilateral lower extremity ultrasound ordered to identify any additional clot burden  Treating patient with heparin infusion  Monitoring patient on telemetry in the progressive unit

## 2022-01-20 NOTE — Telephone Encounter (Signed)
Front desk  Please give a visit with nurse practitioner for hospital follow-up focal submassive pulmonary embolism 3-4 weeks from now     SIGNATURE    Dr. Kalman Shan, M.D., F.C.C.P,  Pulmonary and Critical Care Medicine Staff Physician, [facility] Director - Interstitial Lung Disease  Program  Medical Director - [facility] Pulmonary Fibrosis [facility] [place], [place], [address]  NPI Number:  NPI #[id number] [facility] Number: [id number]  Pager: [phone], If no answer  -> Check AMION or Try [phone] Telephone (clinical office): [phone] Telephone (research): [phone]  2:48 PM 01/20/2022

## 2022-01-20 NOTE — Assessment & Plan Note (Signed)
   Continue home regimen of Sinemet

## 2022-01-20 NOTE — Consult Note (Signed)
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  She has Parkinson's and back and neck issues.  She also has knee replacement from 5 years ago on the right side.  She has hip arthritis.  She has been less active for the last 1 month and using a cane.  Also in early June 2023 was in the ER with diarrhea.  Other medical issues include Parkinson disease, lumbar spinal stenosis, sialorrhea, stable ascending thoracic aorta aneurysm 4.5 cm] sees Dr. Jacinto Halim.,  Complex migraine, hyperlipidemia, acid reflux.  Admitted on 01/19/2022 with 3-day history of shortness of breath and cough.  No sick contacts.  Found to have submassive PE with RV LV ratio 1.12 and a slightly elevated troponin that has been flat around 28.  Requiring new onset hypoxemia 2 L of oxygen but normal blood pressure normal heart rate normal respiratory rate and normal lactic acid.  Admitted to the hospitalist and CCM consulted for submassive PE response.  At the time of CCM evaluation on 01/20/2022: Her pulse ox on room air at rest was 93-94%   PESI/ SEverity assessmnet  -103 points/CLass 3 ( age 66, and needed o2 2L  but female normal HR, RR, mentation. And no hx of cancer, lung/heart diseaes) -> - trop 31-> 28 - BNP- Not avail - lacti acid - normal - CTA - RV/LV strain 1.12 - ECHO - PENDING = Risk factorL: decreased mobility   Thrrombolytic COntraindications (from Uptodate)  -contraindication in RED.  Absolute contraindications Prior intracranial hemorrhage Known structural cerebral vascular lesion Known malignant intracranial neoplasm Ischemic stroke within 3 months (excluding stroke within 3 hours*) Suspected aortic dissection - NO but has thoracic aorta aneurusm 4.3 cm Active bleeding or  bleeding diathesis (excluding menses) Significant closed-head trauma or facial trauma within 3 months  Relative contraindications History of chronic, severe, poorly controlled hypertension Severe uncontrolled hypertension on presentation (SBP >180 mmHg or DBP >110 mmHg) History of ischemic stroke >3 months prior Traumatic or prolonged (>10 minutes) CPR or major surgery <3 weeks Recent (within 2 to 4 weeks) internal bleeding Noncompressible vascular punctures Recent invasive procedure For streptokinase/anistreplase - Prior exposure (>5 days ago) or prior allergic reaction to these agents Pregnancy Active peptic ulcer Pericarditis or pericardial fluid Current use of anticoagulant (eg, warfarin sodium) that has produced an elevated INR >1.7 or PT >15 seconds Age >75 years Diabetic retinopathy    Past Medical History:    has a past medical history of Anginal pain (HCC), Angio-edema, Arthritis, Atypical chest pain (07/06/2009), GERD (gastroesophageal reflux disease), Hyperlipemia, Migraines, and Toxic effect of venom of bees, unintentional (06/03/2017).   reports that she quit smoking about 58 years ago. Her smoking use included cigarettes. She has a 1.25 pack-year smoking history. She has never used smokeless tobacco.  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     denies surgery for this   TONSILLECTOMY  TOTAL KNEE ARTHROPLASTY Right 09/25/2015   Procedure: TOTAL KNEE ARTHROPLASTY;  Surgeon: Ollen GrossFrank Aluisio, MD;  Location: [facility];  Service: Orthopedics;  Laterality: Right;    Allergies  Allergen Reactions   Ciprofloxacin     Can not take due to hx of Cdiff  per patient   Wasp Venom Other (See Comments)    dizziness   Fluconazole Rash    Immunization History  Administered Date(s) Administered   PFIZER(Purple Top)SARS-COV-2 Vaccination 09/10/2019, 10/05/2019    Family History  Problem Relation Age of Onset   Prostate cancer Father    Healthy Daughter    Epilepsy Son    Healthy Son    Allergic rhinitis Neg Hx    Angioedema Neg Hx    Asthma Neg Hx    Eczema Neg Hx    Immunodeficiency Neg Hx    Urticaria Neg Hx      Current Facility-Administered Medications:    acetaminophen (TYLENOL) tablet 650 mg, 650 mg, Oral, Q6H PRN **OR** acetaminophen (TYLENOL) suppository 650 mg, 650 mg, Rectal, Q6H PRN, Shalhoub, Deno LungerGeorge J, MD   aspirin EC tablet 81 mg, 81 mg, Oral, Daily, Shalhoub, Deno LungerGeorge J, MD, 81 mg at 01/20/22 0813   atorvastatin (LIPITOR) tablet 20 mg, 20 mg, Oral, QPC supper, Shalhoub, Deno LungerGeorge J, MD   carbidopa-levodopa (SINEMET IR) 25-100 MG per tablet immediate release 1.5 tablet, 1.5 tablet, Oral, TID, Shalhoub, Deno LungerGeorge J, MD, 1.5 tablet at 01/20/22 0813   [START ON 01/21/2022] furosemide (LASIX) tablet 20 mg, 20 mg, Oral, Once per day on Mon Fri, Mathews, Elizabeth G, MD   heparin ADULT infusion 100 units/mL (25000 units/25250mL), 1,100 Units/hr, Intravenous, Continuous, Rexford Mausucker, Mary-Ashlyn, RPH, Last Rate: 11 mL/hr at 01/20/22 0935, 1,100 Units/hr at 01/20/22 0935   ondansetron (ZOFRAN) tablet 4 mg, 4 mg, Oral, Q6H PRN **OR** ondansetron (ZOFRAN) injection 4 mg, 4 mg, Intravenous, Q6H PRN, Shalhoub, Deno LungerGeorge J, MD   pantoprazole (PROTONIX) EC tablet 40 mg, 40 mg, Oral, Daily, Shalhoub, Deno LungerGeorge J, MD, 40 mg at 01/20/22 0813   polyethylene glycol (MIRALAX / GLYCOLAX) packet 17 g, 17 g, Oral, Daily PRN, Shalhoub, Deno LungerGeorge J, MD   propranolol ER (INDERAL LA) 24 hr capsule 80 mg, 80 mg, Oral, Daily, Shalhoub, Deno LungerGeorge J, MD, 80 mg at 01/20/22 0812     Significant Hospital Events:  01/19/2022 - admit  Interim History / Subjective:   01/20/2022 -  [facility]  Objective   Blood pressure (!) 156/78, pulse 66, temperature 97.6 F (36.4 C), resp. rate 17, height 5\' 5"  (1.651 m), weight 70.3 kg, SpO2 98 %.        Intake/Output Summary (Last 24 hours) at 01/20/2022 1149 Last data filed at 01/20/2022 0600 Gross per 24 hour  Intake 543.03 ml  Output --  Net 543.03 ml   Filed Weights   01/19/22 1210  Weight: 70.3 kg    Examination: General: Pleasant female lying in bed.  Her cane is by the side of the bed. HENT: Oxygen on.  No palpable neck nodes no elevated JVP Lungs: Clear to auscultation bilaterally Cardiovascular: No murmurs regular rate and rhythm Abdomen: Soft nontender no organomegaly Extremities: She has some varicose veins.  Right knee has scar from knee replacement.  No specific warmth or tenderness or redness Neuro: Alert and oriented x3.  Moves all 4 extremities. GU: Not examined  Resolved Hospital Problem list   x  Assessment & Plan:   Acute hypoxic respiratory failure [mild] 2 L  oxygen requirement secondary to -  Acute submassive pulmonary embolism  - PESI  score - class 3 intermediate risk [on account of age and hypoxemia at admission]  -Based on European criteria -she would be low intermediate risk [on account of slightly high troponin but normal lactic acid]  01/20/2022 -> course is 1 of improvement since admission with near resolution of hypoxemia  P:   Continue IV heparin therapy for 48 to 72 hours - Once deemed stable without evidence of deterioration -change to oral anticoagulation [she prefers Eliquis because her husband is on it]  Rescue therapy only in the event of deterioration  -For rescue personally would recommend mechanical thrombectomy as first choice with second choice of catheter directed local lysis and with third choice of systemic lysis.  This is because of her age and thoracic aortic aneurysm and the desire to avoid thrombolysis to the extent possible.   Systemic lysis is to be reserved in the case of circulatory shock or significantly worsening respiratory failure without time for action.   -Given current improvement okay to hold off on contacting interventional radiology at this point   Duration of oral anticoagulation  -This to be determined in the outpatient setting; 6-24 months  Pulmonary infiltrates   -This is most likely due to PE.  Nevertheless viral or bacterial etiologies are possible though hihgly unlikely.  She is status post 1 dose of ceftriaxone and azithromycin.  Plan - Recommend getting respiratory virus panel and necessary infectious disease work-up.  Recommend restarting antibiotics only if these are abnormal -Needs outpatient pulmonary follow-up for resolution of these infiltrates (PCCM will arrange]  Best practice (daily eval):  Her daughter is infectious disease specialist at [facility].  Her name is Maurie Boettcher.  She is on her way.  Patient sent once her daughter arrives she might CCM to update.  CCM will sign off at this moment  Please call with any questions  We will send a message and arrange outpatient follow-up         SIGNATURE    Dr. Kalman Shan, M.D., F.C.C.P,  Pulmonary and Critical Care Medicine Staff Physician, [facility] Center Director - Interstitial Lung Disease  Program  Pulmonary Fibrosis [facility] at [facility] [place], [place], [address]  NPI Number:  NPI #[id number]  Pager: [phone], If no answer  -> Check AMION or Try [phone] Telephone (clinical office): [phone] Telephone (research): [phone]  11:49 AM 01/20/2022   01/20/2022 11:49 AM    LABS    PULMONARY No results for input(s): "PHART", "PCO2ART", "PO2ART", "HCO3", "TCO2", "O2SAT" in the last 168 hours.  Invalid input(s): "PCO2", "PO2"  CBC Recent Labs  Lab 01/19/22 1237 01/20/22 0750  HGB 12.3 10.9*  HCT 38.1 34.1*  WBC 5.2 4.2  PLT  142* 122*    COAGULATION Recent Labs  Lab 01/19/22 1320  INR 1.0    CARDIAC  No results for input(s): "TROPONINI" in the last 168 hours. No results for input(s): "PROBNP" in the last 168 hours.  CHEMISTRY Recent Labs  Lab 01/19/22 1237 01/20/22 0750  NA 142 143  K 4.1 3.6  CL 109 110  CO2 25 25  GLUCOSE 109* 95  BUN 21 18  CREATININE 0.92 0.80  CALCIUM 8.5* 8.1*  MG  --  2.1   Estimated Creatinine Clearance: 52.4 mL/min (by C-G formula based on SCr of 0.8 mg/dL).   LIVER Recent Labs  Lab 01/19/22 1237  01/19/22 1320 01/20/22 0750  AST 12*  --  9*  ALT <5  --  <5  ALKPHOS 100  --  86  BILITOT 0.8  --  0.9  PROT 6.4*  --  5.9*  ALBUMIN 3.4*  --  3.2*  INR  --  1.0  --      INFECTIOUS Recent Labs  Lab 01/19/22 1356  LATICACIDVEN 1.1     ENDOCRINE CBG (last 3)  Recent Labs    01/19/22 1219  GLUCAP 102*         IMAGING x48h  - image(s) personally visualized  -   highlighted in bold CT Angio Chest PE W and/or Wo Contrast  Result Date: 01/19/2022 CLINICAL DATA:  Lightheadedness and nausea for the past 2 days. EXAM: CT ANGIOGRAPHY CHEST WITH CONTRAST TECHNIQUE: Multidetector CT imaging of the chest was performed using the standard protocol during bolus administration of intravenous contrast. Multiplanar CT image reconstructions and MIPs were obtained to evaluate the vascular anatomy. RADIATION DOSE REDUCTION: This exam was performed according to the departmental dose-optimization program which includes automated exposure control, adjustment of the mA and/or kV according to patient size and/or use of iterative reconstruction technique. CONTRAST:  69mL OMNIPAQUE IOHEXOL 350 MG/ML SOLN COMPARISON:  Chest radiographs obtained earlier today. Chest CTA dated 09/25/2021. FINDINGS: Cardiovascular: Multiple bilateral pulmonary arterial filling defects, including the distal right main pulmonary artery. The right ventricular to left ventricular ratio is 1.12.  Interval increase in transverse diameter of the main pulmonary artery, currently measuring 3.5 cm, previously 3.2 cm. 4.3 cm ascending thoracic aortic aneurysm, previously shown to be stable for 10 years. Therefore, this does not need further follow-up unless clinically indicated. Atheromatous calcifications, including the coronary arteries and aorta. Mediastinum/Nodes: No enlarged mediastinal, hilar, or axillary lymph nodes. Thyroid gland, trachea, and esophagus demonstrate no significant findings. Small hiatal hernia. Lungs/Pleura: Minimal bilateral dependent atelectasis. Interval wedge-shaped area of consolidation in the anteromedial aspect of the right lower lobe with an air bronchogram. Interval minimal irregular patchy density more superiorly and laterally in the right lower lobe. Upper Abdomen: Stable liver cysts. Musculoskeletal: Mild thoracic and upper lumbar spine degenerative changes. Review of the MIP images confirms the above findings. IMPRESSION: 1. Positive for acute PE with CT evidence of right heart strain (RV/LV Ratio = 1.12) consistent with at least submassive (intermediate risk) PE. The presence of right heart strain has been associated with an increased risk of morbidity and mortality. Please refer to the "Code PE Focused" order set in EPIC. 2. Interval wedge-shaped and patchy densities in the right lower lobe, suspicious for pulmonary infarction. 3. Interval increased enlargement of the central pulmonary arteries, compatible with pulmonary arterial hypertension associated with the pulmonary emboli. 4.  Calcific coronary artery and aortic atherosclerosis. 5. Stable 4.3 cm ascending thoracic aortic aneurysm, not needing further follow-up unless otherwise clinically indicated. Critical Value/emergent results were called by telephone at the time of interpretation on 01/19/2022 at 4:19 pm to provider Carlyle Dolly, MD, who verbally acknowledged these results. Aortic Atherosclerosis (ICD10-I70.0).  Electronically Signed   By: Beckie Salts M.D.   On: 01/19/2022 16:25   DG Chest 2 View  Result Date: 01/19/2022 CLINICAL DATA:  86 year old female with history of shortness of breath. EXAM: CHEST - 2 VIEW COMPARISON:  Chest x-ray 08/05/2017. FINDINGS: Ill-defined opacity in the medial segment of the right middle lobe concerning for probable bronchopneumonia. Lung volumes are normal. No pleural effusions. No pneumothorax. No pulmonary nodule or mass noted. Pulmonary vasculature and  the cardiomediastinal silhouette are within normal limits. Atherosclerotic calcifications in the thoracic aorta. IMPRESSION: 1. Findings are concerning for bronchopneumonia in the medial segment of the right middle lobe. Followup PA and lateral chest X-ray is recommended in 3-4 weeks following trial of antibiotic therapy to ensure resolution and exclude underlying malignancy. 2. Aortic atherosclerosis. Electronically Signed   By: Trudie Reed M.D.   On: 01/19/2022 13:31

## 2022-01-20 NOTE — Assessment & Plan Note (Signed)
.   Continuing home regimen of lipid lowering therapy.  

## 2022-01-20 NOTE — Assessment & Plan Note (Signed)
   Slightly elevated troponins likely secondary to strain due to pulmonary emboli  Trajectory of troponin elevation is flat  Patient is currently chest pain-free  Patient is unlikely to be suffering from any sort of plaque rupture  Continue to monitor closely on telemetry, will perform additional troponin as necessary if patient begins to complain of chest pain

## 2022-01-20 NOTE — Assessment & Plan Note (Signed)
Continuing home regimen of daily PPI therapy.  

## 2022-01-21 ENCOUNTER — Inpatient Hospital Stay (HOSPITAL_COMMUNITY): Payer: Medicare PPO

## 2022-01-21 DIAGNOSIS — I2609 Other pulmonary embolism with acute cor pulmonale: Secondary | ICD-10-CM | POA: Diagnosis not present

## 2022-01-21 DIAGNOSIS — Z7901 Long term (current) use of anticoagulants: Secondary | ICD-10-CM | POA: Diagnosis not present

## 2022-01-21 DIAGNOSIS — I2699 Other pulmonary embolism without acute cor pulmonale: Secondary | ICD-10-CM

## 2022-01-21 LAB — CBC
HCT: 34.5 % — ABNORMAL LOW (ref 36.0–46.0)
Hemoglobin: 10.7 g/dL — ABNORMAL LOW (ref 12.0–15.0)
MCH: 30.7 pg (ref 26.0–34.0)
MCHC: 31 g/dL (ref 30.0–36.0)
MCV: 98.9 fL (ref 80.0–100.0)
Platelets: 125 10*3/uL — ABNORMAL LOW (ref 150–400)
RBC: 3.49 MIL/uL — ABNORMAL LOW (ref 3.87–5.11)
RDW: 12.7 % (ref 11.5–15.5)
WBC: 4.7 10*3/uL (ref 4.0–10.5)
nRBC: 0 % (ref 0.0–0.2)

## 2022-01-21 LAB — COMPREHENSIVE METABOLIC PANEL
ALT: 5 U/L (ref 0–44)
AST: 8 U/L — ABNORMAL LOW (ref 15–41)
Albumin: 2.9 g/dL — ABNORMAL LOW (ref 3.5–5.0)
Alkaline Phosphatase: 77 U/L (ref 38–126)
Anion gap: 8 (ref 5–15)
BUN: 19 mg/dL (ref 8–23)
CO2: 23 mmol/L (ref 22–32)
Calcium: 8.3 mg/dL — ABNORMAL LOW (ref 8.9–10.3)
Chloride: 111 mmol/L (ref 98–111)
Creatinine, Ser: 0.74 mg/dL (ref 0.44–1.00)
GFR, Estimated: >60 mL/min (ref >60)
Glucose, Bld: 107 mg/dL — ABNORMAL HIGH (ref 70–99)
Potassium: 4.2 mmol/L (ref 3.5–5.1)
Sodium: 142 mmol/L (ref 135–145)
Total Bilirubin: 0.6 mg/dL (ref 0.3–1.2)
Total Protein: 5.5 g/dL — ABNORMAL LOW (ref 6.5–8.1)

## 2022-01-21 LAB — D-DIMER, QUANTITATIVE: D-Dimer, Quant: 1.49 ug/mL-FEU — ABNORMAL HIGH (ref 0.00–0.50)

## 2022-01-21 LAB — TROPONIN I (HIGH SENSITIVITY): Troponin I (High Sensitivity): 9 ng/L (ref <18)

## 2022-01-21 LAB — PROCALCITONIN: Procalcitonin: <0.1 ng/mL

## 2022-01-21 LAB — HEPARIN LEVEL (UNFRACTIONATED): Heparin Unfractionated: 0.46 IU/mL (ref 0.30–0.70)

## 2022-01-21 LAB — BRAIN NATRIURETIC PEPTIDE: B Natriuretic Peptide: 157.8 pg/mL — ABNORMAL HIGH (ref 0.0–100.0)

## 2022-01-21 LAB — LACTIC ACID, PLASMA: Lactic Acid, Venous: 0.8 mmol/L (ref 0.5–1.9)

## 2022-01-21 MED ORDER — IOHEXOL 9 MG/ML PO SOLN
500.0000 mL | ORAL | Status: AC
  Administered 2022-01-21: 500 mL via ORAL

## 2022-01-21 MED ORDER — IOHEXOL 300 MG/ML  SOLN
100.0000 mL | Freq: Once | INTRAMUSCULAR | Status: AC | PRN
  Administered 2022-01-21: 100 mL via INTRAVENOUS

## 2022-01-21 MED ORDER — IOHEXOL 9 MG/ML PO SOLN
ORAL | Status: AC
  Administered 2022-01-21: 500 mL via ORAL
  Filled 2022-01-21: qty 1000

## 2022-01-21 NOTE — Progress Notes (Signed)
BLE venous duplex has been completed.   Results can be found under chart review under CV PROC. 01/21/2022 10:13 AM Savahanna Almendariz RVT, RDMS

## 2022-01-21 NOTE — Progress Notes (Signed)
PROGRESS NOTE    Linda Lee  IOX:[id number] DOB: 1936-01-27 DOA: 01/19/2022 PCP: Rodrigo Ran, MD  Brief Narrative:  86 year old female with past medical history of Parkinson disease, lumbar spinal stenosis, sialorrhea,ascending thoracic aortic aneurysm (4.5cm), complex migraine headaches, hyperlipidemia, gastroesophageal reflux disease who presents to med [facility] emergency department complaining of dyspnea on exertion, chest discomfort and nausea.   Patient explains that for approximately the past 2 to 3 days she has been experiencing shortness of breath.  Shortness of breath is worse with exertion and improved with rest.  As the days progressed patient's shortness of breath became associated with chest discomfort.  Patient describes this chest discomfort is right-sided, tight in quality and moderate in intensity.  Discomfort seems to occur more with exertion and improved with rest.  Patient complains of associated dry cough but denies fevers.  Finally, patient also recalls some associated bilateral lower extremity swelling several days ago but this seems to have improved.  Patient denies sick contacts recent travel or contact with confirmed COVID-19 infection   Upon evaluation in the emergency department CT angiogram of the chest revealed an acute pulmonary embolism with evidence of right heart strain and RV/LV ratio of 1.12 concerning for submassive pulmonary embolism.  Additionally there were wedge-shaped densities in the right lower lobe suspicious for pulmonary infarction.  ER provider then initiated a heparin infusion.  Hospitalist group was then called and patient was accepted for transfer to [facility] long hospital for continued medical care. She has a history of C. difficile in 2018 from that time she was on ciprofloxacin COVID-negative this admission Assessment & Plan:   Principal Problem:   Acute pulmonary embolism (HCC) Active Problems:   Parkinson disease (HCC)    Elevated troponin level not due myocardial infarction   Asymptomatic bacteriuria   Mixed hyperlipidemia   GERD without esophagitis   #1 acute bilateral pulmonary embolism unclear as to what provoked this.  She has no recent history of travel malignancy COVID.  She was on 2 L of oxygen on admission and currently she is on room air and has been started on heparin drip since admission.   Echo-normal ejection fraction, elevated pulmonary artery pressure Venous Doppler ordered. Appreciate PCCM input.  Will continue heparin for 48 to 72 hours. Then switch to oral anticoagulation with Eliquis. BNP 157 from 204 Troponin 28 and 13 and 9 Lactic acid 1.1 and 0.7 and 0.8 D-dimer 1.47 and 1.49 Pro-Cal low Venous Doppler pending.  CT abdomen and pelvis ordered to rule out any malignancy. WBC normal on admission.  She received IV antibiotics in med [facility] ER which has not been continued. Patient reports she has not been very active due to back pain and knee pains  #2 history of Parkinson's disease on Sinemet  #3 asymptomatic bacteriuria she denies any abdominal pain or dysuria or fever.  We will hold off on antibiotics.  #4 hyperlipidemia continue Lipitor  #5 GERD  #6 elevated troponin likely from demand ischemia  #7 hypertension on propanolol and Lasix twice a week patient has lower extremity edema.   Estimated body mass index is 25.79 kg/m as calculated from the following:   Height as of this encounter: 5\' 5"  (1.651 m).   Weight as of this encounter: 70.3 kg.  DVT prophylaxis: Heparin  code Status: Full code  family Communication: Daughter at bedside disposition Plan:  Status is: Inpatient Remains inpatient appropriate because: Acute bilateral pulmonary embolism   Consultants:  PCCM  Procedures:  None  antimicrobials: None  Subjective: She is feeling better she is off of her oxygen although they had to place her on oxygen overnight as her sats dropped to the 80s when  she was sleeping. No chest pain nausea vomiting diarrhea. Objective: Vitals:   01/20/22 1150 01/20/22 1402 01/20/22 2045 01/21/22 0824  BP: (!) 154/69  121/61 (!) 159/75  Pulse: (!) 58  64 (!) 57  Resp:   17 18  Temp: 97.6 F (36.4 C)  97.8 F (36.6 C) 98.2 F (36.8 C)  TempSrc: Oral   Oral  SpO2: 99% 94% 95% 100%  Weight:      Height:        Intake/Output Summary (Last 24 hours) at 01/21/2022 1142 Last data filed at 01/21/2022 0916 Gross per 24 hour  Intake 859.71 ml  Output --  Net 859.71 ml    Filed Weights   01/19/22 1210  Weight: 70.3 kg    Examination:  General exam: Appears in no acute distress Respiratory system: Rhonchi to auscultation. Respiratory effort normal. Cardiovascular system: S1 & S2 heard, RRR. No JVD, murmurs, rubs, gallops or clicks. No pedal edema. Gastrointestinal system: Abdomen is nondistended, soft and nontender. No organomegaly or masses felt. Normal bowel sounds heard. Central nervous system: Alert and oriented. No focal neurological deficits. Extremities: 2+ pitting edema Skin: No rashes, lesions or ulcers Psychiatry: Judgement and insight appear normal. Mood & affect appropriate.     Data Reviewed: I have personally reviewed following labs and imaging studies  CBC: Recent Labs  Lab 01/19/22 1237 01/20/22 0750 01/21/22 0334  WBC 5.2 4.2 4.7  NEUTROABS  --  2.9  --   HGB 12.3 10.9* 10.7*  HCT 38.1 34.1* 34.5*  MCV 95.5 96.6 98.9  PLT 142* 122* 125*    Basic Metabolic Panel: Recent Labs  Lab 01/19/22 1237 01/20/22 0750 01/21/22 0334  NA 142 143 142  K 4.1 3.6 4.2  CL 109 110 111  CO2 25 25 23   GLUCOSE 109* 95 107*  BUN 21 18 19   CREATININE 0.92 0.80 0.74  CALCIUM 8.5* 8.1* 8.3*  MG  --  2.1  --     GFR: Estimated Creatinine Clearance: 52.4 mL/min (by C-G formula based on SCr of 0.74 mg/dL). Liver Function Tests: Recent Labs  Lab 01/19/22 1237 01/20/22 0750 01/21/22 0334  AST 12* 9* 8*  ALT <5 <5 5   ALKPHOS 100 86 77  BILITOT 0.8 0.9 0.6  PROT 6.4* 5.9* 5.5*  ALBUMIN 3.4* 3.2* 2.9*    No results for input(s): "LIPASE", "AMYLASE" in the last 168 hours. No results for input(s): "AMMONIA" in the last 168 hours. Coagulation Profile: Recent Labs  Lab 01/19/22 1320  INR 1.0    Cardiac Enzymes: No results for input(s): "CKTOTAL", "CKMB", "CKMBINDEX", "TROPONINI" in the last 168 hours. BNP (last 3 results) No results for input(s): "PROBNP" in the last 8760 hours. HbA1C: No results for input(s): "HGBA1C" in the last 72 hours. CBG: Recent Labs  Lab 01/19/22 1219  GLUCAP 102*    Lipid Profile: No results for input(s): "CHOL", "HDL", "LDLCALC", "TRIG", "CHOLHDL", "LDLDIRECT" in the last 72 hours. Thyroid Function Tests: No results for input(s): "TSH", "T4TOTAL", "FREET4", "T3FREE", "THYROIDAB" in the last 72 hours. Anemia Panel: No results for input(s): "VITAMINB12", "FOLATE", "FERRITIN", "TIBC", "IRON", "RETICCTPCT" in the last 72 hours. Sepsis Labs: Recent Labs  Lab 01/19/22 1356 01/20/22 1229 01/21/22 0334  PROCALCITON  --  <0.10 <0.10  LATICACIDVEN 1.1 0.7 0.8  Recent Results (from the past 240 hour(s))  Blood culture (routine x 2)     Status: None (Preliminary result)   Collection Time: 01/19/22  1:56 PM   Specimen: Right Antecubital; Blood  Result Value Ref Range Status   Specimen Description   Final    RIGHT ANTECUBITAL BLOOD Performed at [facility], [address] [place], [place] [address]    Special Requests   Final    Blood Culture adequate volume BOTTLES DRAWN AEROBIC AND ANAEROBIC Performed at [facility], [address] [place], [place] [address]    Culture   Final    NO GROWTH 2 DAYS Performed at [facility], [address], [place], [place] [address]    Report Status PENDING  Incomplete  Blood culture (routine x 2)     Status: None (Preliminary result)   Collection Time: 01/19/22  1:58 PM   Specimen:  BLOOD RIGHT ARM  Result Value Ref Range Status   Specimen Description   Final    BLOOD RIGHT ARM BLOOD Performed at [facility], [address] [place], [place] [address]    Special Requests   Final    Blood Culture adequate volume BOTTLES DRAWN AEROBIC AND ANAEROBIC Performed at [facility], [address] [place], [place] [address]    Culture   Final    NO GROWTH 2 DAYS Performed at [facility], [address], [place], [place] [address]    Report Status PENDING  Incomplete  SARS Coronavirus 2 by RT PCR (hospital order, performed in [facility] hospital lab) *cepheid single result test* Anterior Nasal Swab     Status: None   Collection Time: 01/19/22  4:21 PM   Specimen: Anterior Nasal Swab  Result Value Ref Range Status   SARS Coronavirus 2 by RT PCR NEGATIVE NEGATIVE Final    Comment: (NOTE) SARS-CoV-2 target nucleic acids are NOT DETECTED.  The SARS-CoV-2 RNA is generally detectable in upper and lower respiratory specimens during the acute phase of infection. The lowest concentration of SARS-CoV-2 viral copies this assay can detect is 250 copies / mL. A negative result does not preclude SARS-CoV-2 infection and should not be used as the sole basis for treatment or other patient management decisions.  A negative result may occur with improper specimen collection / handling, submission of specimen other than nasopharyngeal swab, presence of viral mutation(s) within the areas targeted by this assay, and inadequate number of viral copies (<250 copies / mL). A negative result must be combined with clinical observations, patient history, and epidemiological information.  Fact Sheet for Patients:   RoadLapTop.co.za  Fact Sheet for Healthcare Providers: http://kim-miller.com/  This test is not yet approved or  cleared by the [place] FDA and has been authorized for detection and/or diagnosis of  SARS-CoV-2 by FDA under an Emergency Use Authorization (EUA).  This EUA will remain in effect (meaning this test can be used) for the duration of the COVID-19 declaration under Section 564(b)(1) of the Act, 21 U.S.C. section 360bbb-3(b)(1), unless the authorization is terminated or revoked sooner.  Performed at [facility], [address], [place], [place] [address]          Radiology Studies: VAS [place] LOWER EXTREMITY VENOUS (DVT)  Result Date: 01/21/2022  Lower Venous DVT Study Patient Name:  Linda Lee  Date of Exam:   01/21/2022 Medical Rec #: [id number]  Accession #:    [id number] Date of Birth: 05-Dec-1935          Patient Gender: F Patient Age:   [age] years [facility] Procedure:      VAS US LOWER EXTREMITY VENOUS (DVT) Referring Phys: Lanora ManisELIZABETH Katherene Dinino --------------------------------------------------------------------------------  Indications: Pulmonary embolism.  Comparison Study: No previous exams Performing Technologist: Jody Hill RVT, RDMS  Examination Guidelines: A complete evaluation includes B-mode imaging, spectral Doppler, color Doppler, and power Doppler as needed of all accessible portions of each vessel. Bilateral testing is considered an integral part of a complete examination. Limited examinations for reoccurring indications may be performed as noted. The reflux portion of the exam is performed with the patient in reverse Trendelenburg.  +---------+---------------+---------+-----------+----------+--------------+ RIGHT    CompressibilityPhasicitySpontaneityPropertiesThrombus Aging +---------+---------------+---------+-----------+----------+--------------+ CFV      Full           No       Yes                                 +---------+---------------+---------+-----------+----------+--------------+ SFJ      Full                                                         +---------+---------------+---------+-----------+----------+--------------+ FV Prox  Full           No       Yes                                 +---------+---------------+---------+-----------+----------+--------------+ FV Mid   Full           No       Yes                                 +---------+---------------+---------+-----------+----------+--------------+ FV DistalFull           No       Yes                                 +---------+---------------+---------+-----------+----------+--------------+ PFV      Full                                                        +---------+---------------+---------+-----------+----------+--------------+ POP      Full           No       Yes                                 +---------+---------------+---------+-----------+----------+--------------+ PTV      Full                                                        +---------+---------------+---------+-----------+----------+--------------+  PERO     Full                                                        +---------+---------------+---------+-----------+----------+--------------+   +---------+---------------+---------+-----------+----------+--------------+ LEFT     CompressibilityPhasicitySpontaneityPropertiesThrombus Aging +---------+---------------+---------+-----------+----------+--------------+ CFV      Full           No       Yes                                 +---------+---------------+---------+-----------+----------+--------------+ SFJ      Full                                                        +---------+---------------+---------+-----------+----------+--------------+ FV Prox  Full           No       Yes                                 +---------+---------------+---------+-----------+----------+--------------+ FV Mid   Full           No       Yes                                  +---------+---------------+---------+-----------+----------+--------------+ FV DistalFull           No       Yes                                 +---------+---------------+---------+-----------+----------+--------------+ PFV      Full                                                        +---------+---------------+---------+-----------+----------+--------------+ POP      Full           No       Yes                                 +---------+---------------+---------+-----------+----------+--------------+ PTV      Full                                                        +---------+---------------+---------+-----------+----------+--------------+ PERO     Full                                                        +---------+---------------+---------+-----------+----------+--------------+  Summary: BILATERAL: - No evidence of deep vein thrombosis seen in the lower extremities, bilaterally. -Pulsatile doppler waveforms, bilaterally. - RIGHT: - No cystic structure found in the popliteal fossa.  LEFT: - Cystic structures found in the popliteal fossa.  *See table(s) above for measurements and observations.    Preliminary    ECHOCARDIOGRAM COMPLETE  Result Date: 01/20/2022    ECHOCARDIOGRAM REPORT   Patient Name:   Linda Lee Date of Exam: 01/20/2022 Medical Rec #:  [id number]          Height:       65.0 in Accession #:    [id number]         Weight:       155.0 lb Date of Birth:  [birth year]         BSA:          1.775 m Patient Age:    85 years           BP:           154/69 mmHg Patient Gender: F                  HR:           59 bpm. Exam Location:  Inpatient Procedure: 2D Echo, Cardiac Doppler and Color Doppler Indications:     Pulmonary Embolus I26.09  History:         Patient has prior history of Echocardiogram examinations, most                  recent 09/05/2015. Risk Factors:Dyslipidemia. Ascending thoracic                  aorta aneurysm 4.5 cm. GERD.   Sonographer:     Leta Jungling RDCS Referring Phys:  [id number] Deno Lunger [facility] Diagnosing Phys: Yates Decamp MD IMPRESSIONS  1. Left ventricular ejection fraction, by estimation, is 60 to 65%. The left ventricle has normal function. The left ventricle has no regional wall motion abnormalities. Left ventricular diastolic parameters are consistent with Grade I diastolic dysfunction (impaired relaxation).  2. Right ventricular systolic function is normal. The right ventricular size is normal. There is severely elevated pulmonary artery systolic pressure. The estimated right ventricular systolic pressure is 77.4 mmHg.  3. The mitral valve is normal in structure. Trivial mitral valve regurgitation.  4. Tricuspid valve regurgitation is moderate.  5. The aortic valve is tricuspid. Aortic valve regurgitation is mild.  6. There is mild dilatation of the ascending aorta, measuring 43 mm.  7. The inferior vena cava is dilated in size with <50% respiratory variability, suggesting right atrial pressure of 15 mmHg. FINDINGS  Left Ventricle: Left ventricular ejection fraction, by estimation, is 60 to 65%. The left ventricle has normal function. The left ventricle has no regional wall motion abnormalities. The left ventricular internal cavity size was normal in size. There is  no left ventricular hypertrophy. Left ventricular diastolic parameters are consistent with Grade I diastolic dysfunction (impaired relaxation). Normal left ventricular filling pressure. Right Ventricle: The right ventricular size is normal. No increase in right ventricular wall thickness. Right ventricular systolic function is normal. There is severely elevated pulmonary artery systolic pressure. The tricuspid regurgitant velocity is 3.95 m/s, and with an assumed right atrial pressure of 15 mmHg, the estimated right ventricular systolic pressure is 77.4 mmHg. Left Atrium: Left atrial size was normal in size. Right Atrium: Right atrial size was normal in size.  Pericardium: There is no evidence of  pericardial effusion. Mitral Valve: The mitral valve is normal in structure. Trivial mitral valve regurgitation. Tricuspid Valve: The tricuspid valve is normal in structure. Tricuspid valve regurgitation is moderate. Aortic Valve: The aortic valve is tricuspid. Aortic valve regurgitation is mild. Aortic regurgitation PHT measures 533 msec. Pulmonic Valve: The pulmonic valve was normal in structure. Pulmonic valve regurgitation is trivial. No evidence of pulmonic stenosis. Aorta: The aortic root is normal in size and structure. There is mild dilatation of the ascending aorta, measuring 43 mm. Venous: The inferior vena cava is dilated in size with less than 50% respiratory variability, suggesting right atrial pressure of 15 mmHg. IAS/Shunts: No atrial level shunt detected by color flow Doppler.  LEFT VENTRICLE PLAX 2D LVIDd:         4.90 cm   Diastology LVIDs:         3.60 cm   LV e' medial:    6.31 cm/s LV PW:         0.80 cm   LV E/e' medial:  8.6 LV IVS:        0.80 cm   LV e' lateral:   5.43 cm/s LVOT diam:     2.00 cm   LV E/e' lateral: 10.0 LV SV:         79 LV SV Index:   45 LVOT Area:     3.14 cm  RIGHT VENTRICLE RV S prime:     14.60 cm/s TAPSE (M-mode): 2.0 cm LEFT ATRIUM             Index        RIGHT ATRIUM           Index LA diam:        2.70 cm 1.52 cm/m   RA Area:     15.70 cm LA Vol (A2C):   30.8 ml 17.35 ml/m  RA Volume:   33.70 ml  18.99 ml/m LA Vol (A4C):   34.9 ml 19.66 ml/m LA Biplane Vol: 35.5 ml 20.00 ml/m  AORTIC VALVE LVOT Vmax:   111.00 cm/s LVOT Vmean:  61.200 cm/s LVOT VTI:    0.253 m AI PHT:      533 msec  AORTA Ao Root diam: 3.40 cm Ao Asc diam:  4.30 cm MITRAL VALVE               TRICUSPID VALVE MV Area (PHT): 2.87 cm    TR Peak grad:   62.4 mmHg MV Decel Time: 264 msec    TR Vmax:        395.00 cm/s MV E velocity: 54.40 cm/s MV A velocity: 78.40 cm/s  SHUNTS MV E/A ratio:  0.69        Systemic VTI:  0.25 m                            Systemic  Diam: 2.00 cm Yates Decamp MD Electronically signed by Yates Decamp MD Signature Date/Time: 01/20/2022/9:28:23 PM    Final    CT Angio Chest PE W and/or Wo Contrast  Result Date: 01/19/2022 CLINICAL DATA:  Lightheadedness and nausea for the past 2 days. EXAM: CT ANGIOGRAPHY CHEST WITH CONTRAST TECHNIQUE: Multidetector CT imaging of the chest was performed using the standard protocol during bolus administration of intravenous contrast. Multiplanar CT image reconstructions and MIPs were obtained to evaluate the vascular anatomy. RADIATION DOSE REDUCTION: This exam was performed according to the departmental dose-optimization program which includes automated exposure  control, adjustment of the mA and/or kV according to patient size and/or use of iterative reconstruction technique. CONTRAST:  59mL OMNIPAQUE IOHEXOL 350 MG/ML SOLN COMPARISON:  Chest radiographs obtained earlier today. Chest CTA dated 09/25/2021. FINDINGS: Cardiovascular: Multiple bilateral pulmonary arterial filling defects, including the distal right main pulmonary artery. The right ventricular to left ventricular ratio is 1.12. Interval increase in transverse diameter of the main pulmonary artery, currently measuring 3.5 cm, previously 3.2 cm. 4.3 cm ascending thoracic aortic aneurysm, previously shown to be stable for 10 years. Therefore, this does not need further follow-up unless clinically indicated. Atheromatous calcifications, including the coronary arteries and aorta. Mediastinum/Nodes: No enlarged mediastinal, hilar, or axillary lymph nodes. Thyroid gland, trachea, and esophagus demonstrate no significant findings. Small hiatal hernia. Lungs/Pleura: Minimal bilateral dependent atelectasis. Interval wedge-shaped area of consolidation in the anteromedial aspect of the right lower lobe with an air bronchogram. Interval minimal irregular patchy density more superiorly and laterally in the right lower lobe. Upper Abdomen: Stable liver cysts.  Musculoskeletal: Mild thoracic and upper lumbar spine degenerative changes. Review of the MIP images confirms the above findings. IMPRESSION: 1. Positive for acute PE with CT evidence of right heart strain (RV/LV Ratio = 1.12) consistent with at least submassive (intermediate risk) PE. The presence of right heart strain has been associated with an increased risk of morbidity and mortality. Please refer to the "Code PE Focused" order set in EPIC. 2. Interval wedge-shaped and patchy densities in the right lower lobe, suspicious for pulmonary infarction. 3. Interval increased enlargement of the central pulmonary arteries, compatible with pulmonary arterial hypertension associated with the pulmonary emboli. 4.  Calcific coronary artery and aortic atherosclerosis. 5. Stable 4.3 cm ascending thoracic aortic aneurysm, not needing further follow-up unless otherwise clinically indicated. Critical Value/emergent results were called by telephone at the time of interpretation on 01/19/2022 at 4:19 pm to provider Carlyle Dolly, MD, who verbally acknowledged these results. Aortic Atherosclerosis (ICD10-I70.0). Electronically Signed   By: Beckie Salts M.D.   On: 01/19/2022 16:25   DG Chest 2 View  Result Date: 01/19/2022 CLINICAL DATA:  86 year old female with history of shortness of breath. EXAM: CHEST - 2 VIEW COMPARISON:  Chest x-ray 08/05/2017. FINDINGS: Ill-defined opacity in the medial segment of the right middle lobe concerning for probable bronchopneumonia. Lung volumes are normal. No pleural effusions. No pneumothorax. No pulmonary nodule or mass noted. Pulmonary vasculature and the cardiomediastinal silhouette are within normal limits. Atherosclerotic calcifications in the thoracic aorta. IMPRESSION: 1. Findings are concerning for bronchopneumonia in the medial segment of the right middle lobe. Followup PA and lateral chest X-ray is recommended in 3-4 weeks following trial of antibiotic therapy to ensure resolution  and exclude underlying malignancy. 2. Aortic atherosclerosis. Electronically Signed   By: Trudie Reed M.D.   On: 01/19/2022 13:31        Scheduled Meds:  aspirin EC  81 mg Oral Daily   atorvastatin  20 mg Oral QPC supper   carbidopa-levodopa  1.5 tablet Oral TID   furosemide  20 mg Oral Once per day on Mon Fri   iohexol  500 mL Oral Q1H   iohexol       pantoprazole  40 mg Oral Daily   propranolol ER  80 mg Oral Daily   Continuous Infusions:  heparin 1,100 Units/hr (01/21/22 0900)     LOS: 2 days    Time spent: 39 min  Alwyn Ren, MD 01/21/2022, 11:42 AM

## 2022-01-21 NOTE — Telephone Encounter (Signed)
Patient is scheduled with Rhunette Croft, NP on 02/11/2022 at 10:30am- appt reminder mailed, nothing further needed.

## 2022-01-21 NOTE — TOC Progression Note (Signed)
Transition of Care [facility]) - Progression Note    Patient Details  Name: Linda Lee MRN: [id number] Date of Birth: 1936/02/09  Transition of Care [facility]) CM/SW Contact  Geni Bers, RN Phone Number: 01/21/2022, 4:03 PM  Clinical Narrative:     TOC will continue to follow for discharge.   Expected Discharge Plan: Home w Home Health Services Barriers to Discharge: No Barriers Identified  Expected Discharge Plan and Services Expected Discharge Plan: Home w Home Health Services     Post Acute Care Choice: Home Health Living arrangements for the past 2 months: Single Family Home                                       Social Determinants of Health (SDOH) Interventions    Readmission Risk Interventions     No data to display

## 2022-01-21 NOTE — Progress Notes (Signed)
ANTICOAGULATION CONSULT NOTE follow up  Pharmacy Consult for Heparin Indication: pulmonary embolism  Allergies  Allergen Reactions   Ciprofloxacin     Can not take due to hx of Cdiff per patient   Wasp Venom Other (See Comments)    dizziness   Fluconazole Rash    Patient Measurements: Height: 5\' 5"  (165.1 cm) Weight: 70.3 kg (155 lb) IBW/kg (Calculated) : 57 Heparin Dosing Weight: 70.3 kg  Vital Signs: Temp: 97.8 F (36.6 C) (06/18 2045) BP: 121/61 (06/18 2045) Pulse Rate: 64 (06/18 2045)  Labs: Recent Labs    01/19/22 1237 01/19/22 1320 01/19/22 1452 01/19/22 2355 01/20/22 0750 01/20/22 1229 01/20/22 1244 01/21/22 0334  HGB 12.3  --   --   --  10.9*  --   --  10.7*  HCT 38.1  --   --   --  34.1*  --   --  34.5*  PLT 142*  --   --   --  122*  --   --  125*  LABPROT  --  13.5  --   --   --   --   --   --   INR  --  1.0  --   --   --   --   --   --   HEPARINUNFRC  --   --   --    < > 0.75*  --  0.59 0.46  CREATININE 0.92  --   --   --  0.80  --   --   --   TROPONINIHS 31*  --  28*  --   --  13  --   --    < > = values in this interval not displayed.     Estimated Creatinine Clearance: 52.4 mL/min (by C-G formula based on SCr of 0.8 mg/dL).   Medical History: Past Medical History:  Diagnosis Date   Anginal pain (HCC)    Angio-edema    Arthritis    Atypical chest pain 07/06/2009   Qualifier: Diagnosis of  By: 14/09/2008, MD, Christopher     GERD (gastroesophageal reflux disease)    Hyperlipemia    Migraines    Toxic effect of venom of bees, unintentional 06/03/2017    Medications:  Medications Prior to Admission  Medication Sig Dispense Refill Last Dose   aspirin 81 MG EC tablet Take 81 mg by mouth daily. Swallow whole.   Past Week   atorvastatin (LIPITOR) 20 MG tablet Take 1 tablet (20 mg total) by mouth daily after supper. 30 tablet 0 Past Week   carbidopa-levodopa (SINEMET IR) 25-100 MG tablet TAKE 1 AND 1/2 TABLETS BY MOUTH THREE TIMES DAILY 270  tablet 0 01/19/2022   CRANBERRY PO Take 300 mg daily by mouth.   Past Week   Cyanocobalamin (VITAMIN B-12 PO) Take 1,200 mcg daily by mouth.    Past Week   EPINEPHrine 0.3 mg/0.3 mL IJ SOAJ injection Inject 0.3 mg into the muscle once as needed for up to 1 dose (anaphylaxis). 2 each 1 unk   Ergocalciferol (VITAMIN D2) 2000 units TABS Take 2,000 Units daily by mouth.    Past Week   furosemide (LASIX) 20 MG tablet Take 20 mg by mouth 2 (two) times a week. Take 1 tablet  M, F   Past Week   KLOR-CON M10 10 MEQ tablet 10 mEq 2 (two) times a week.   Past Week   omeprazole (PRILOSEC) 40 MG capsule Take 40 mg by mouth continuous  as needed (indigestion).   Past Month   propranolol ER (INDERAL LA) 80 MG 24 hr capsule TAKE 1 CAPSULE(80 MG) BY MOUTH DAILY (Patient taking differently: Take 80 mg by mouth daily.) 90 capsule 3 Past Week   saccharomyces boulardii (FLORASTOR) 250 MG capsule Take 250 mg by mouth 3 (three) times a week.   Past Week    Assessment: 46 yof with a history of HLD, thoracic aortic aneurysm, migraine, coronary calcification on CT. Patient is presenting with fatigue, cough, dyspnea, nausea, and chest tightness. Heparin per pharmacy consult placed for pulmonary embolus.  CTA PE w/ acute PE w/ evidence of RHS - stable ascending thoracic aortic aneurysm noted as well  Patient is not on anticoagulation prior to arrival.  Hgb 12.3; plt 142  Today, 01/21/2022: - HL 0.46 therapeutic on 1100 units/hr - Hgb 12.3 >> 10.7 platelets 142 >> 125 - No bleeding reported  Goal of Therapy:  Heparin level 0.3-0.7 units/ml Monitor platelets by anticoagulation protocol: Yes   Plan:  Continue heparin drip at 1100 units/hr Daily heparin level and CBC  Arley Phenix RPh 01/21/2022, 4:21 AM

## 2022-01-21 NOTE — Progress Notes (Signed)
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  She has Parkinson's and back and neck issues.  She also has knee replacement from 5 years ago on the right side.  She has hip arthritis.  She has been less active for the last 1 month and using a cane.  Also in early June 2023 was in the ER with diarrhea.  Other medical issues include Parkinson disease, lumbar spinal stenosis, sialorrhea, stable ascending thoracic aorta aneurysm 4.5 cm] sees Dr. Jacinto Halim.,  Complex migraine, hyperlipidemia, acid reflux.  Admitted on 01/19/2022 with 3-day history of shortness of breath and cough.  No sick contacts.  Found to have submassive PE with RV LV ratio 1.12 and a slightly elevated troponin that has been flat around 28.  Requiring new onset hypoxemia 2 L of oxygen but normal blood pressure normal heart rate normal respiratory rate and normal lactic acid.  Admitted to the hospitalist and CCM consulted for submassive PE response.  At the time of CCM evaluation on 01/20/2022: Her pulse ox on room air at rest was 93-94%   PESI/ SEverity assessmnet  -103 points/CLass 3 ( age 86, and needed o2 2L  but female normal HR, RR, mentation. And no hx of cancer, lung/heart diseaes) -> - trop 31-> 28 - BNP- Not avail - lacti acid - normal - CTA - RV/LV strain 1.12 - ECHO - PENDING = Risk factorL: decreased mobility   Significant Hospital Events:  01/19/2022 - admit  Interim History / Subjective:   She denies complaints other than wanting to get OOB and walk.  Objective   Blood pressure (!) 156/72, pulse (!) 55, temperature 97.7 F (36.5 C), temperature source Oral, resp. rate 18, height 5\' 5"  (1.651 m), weight 70.3 kg, SpO2 97 %.        Intake/Output Summary (Last 24 hours) at  01/21/2022 1346 Last data filed at 01/21/2022 0916 Gross per 24 hour  Intake 859.71 ml  Output --  Net 859.71 ml    Filed Weights   01/19/22 1210  Weight: 70.3 kg    Examination: General: Healthy-appearing elderly woman lying in bed no acute distress HENT: [place]/AT, eyes anicteric Lungs: Breathing comfortably on room air, CTA B, no conversational dyspnea. Cardiovascular: S1-S2, regular rate and rhythm, no murmurs Abdomen: Soft, nontender Extremities: Mild left lower extremity edema, no cyanosis or clubbing Neuro: Awake and alert, answering all questions appropriately, moving all extremities. Derm: Warm, dry, no diffuse rashes  Echo-LVEF 60 to 65%, grade 1 diastolic dysfunction.  Normal RV size and function, severely elevated PASP.  Moderate TR.  Mild AI. BNP 157.8 Troponin 9  H/H 10.7/34.5 Platelets 125  Resolved Hospital Problem list     Assessment & Plan:   Acute hypoxic respiratory failure hypoxia-resolved Acute submassive pulmonary embolism, unprovoked  - PESI  score - class 3 intermediate risk [on account of age and hypoxemia at admission]  -Based on European criteria -she would be low intermediate risk [on account of slightly high troponin but normal lactic acid] P:   -Safe to transition to Eliquis.  Do not think she has an indication to have concurrent aspirin therapy as an outpatient with no previous history of stroke or heart attack. -Needs age-appropriate cancer screening as an outpatient-has  a mammogram scheduled in a few weeks, is otherwise up-to-date per her report. - Reviewed anticoagulation precautions with her and her daughter.  If she ever has head trauma while on anticoagulation she needs to come to the hospital for evaluation.  She needs to let us know if she develops any bleeding complications while on anticoagulation. - Given that her clot was unprovoked, recommend lifelong anticoagulation as long as risks do not outweigh benefits.  Needs minimum 3 months of  anticoagulation -Okay to get out of bed - Do not anticipate that she will require escalation of therapy to include thrombolytics or mechanical intervention with downtrending biomarkers. - Recommend outpatient follow-up in ~ month> follow up has been previously requested.  Pulmonary infiltrates, right lower lobe  -This is most likely due to PE.  Nevertheless viral or bacterial etiologies are possible though hihgly unlikely.  She is status post 1 dose of ceftriaxone and azithromycin. Plan - Recommend outpatient imaging to ensure resolution  Mrs. Rowe Clack and her daughter who is a physician were updated at bedside.  All questions answered.  PCCM will sign off.  Please call with questions.   Best practice (daily eval):  Her daughter is infectious disease specialist at [facility].  Her name is Maurie Boettcher.      Steffanie Dunn, DO 01/21/22 1:53 PM [facility]

## 2022-01-22 ENCOUNTER — Encounter (HOSPITAL_COMMUNITY): Payer: Medicare PPO

## 2022-01-22 DIAGNOSIS — I2609 Other pulmonary embolism with acute cor pulmonale: Secondary | ICD-10-CM | POA: Diagnosis not present

## 2022-01-22 LAB — BRAIN NATRIURETIC PEPTIDE: B Natriuretic Peptide: 142.6 pg/mL — ABNORMAL HIGH (ref 0.0–100.0)

## 2022-01-22 LAB — D-DIMER, QUANTITATIVE: D-Dimer, Quant: 1.42 ug/mL-FEU — ABNORMAL HIGH (ref 0.00–0.50)

## 2022-01-22 LAB — HEPARIN LEVEL (UNFRACTIONATED): Heparin Unfractionated: 0.53 IU/mL (ref 0.30–0.70)

## 2022-01-22 MED ORDER — APIXABAN (ELIQUIS) VTE STARTER PACK (10MG AND 5MG): ORAL_TABLET | ORAL | 0 refills | Status: DC

## 2022-01-22 MED ORDER — POLYETHYLENE GLYCOL 3350 17 G PO PACK: 17.0000 g | PACK | Freq: Every day | ORAL | 0 refills | Status: DC | PRN

## 2022-01-22 MED ORDER — APIXABAN 5 MG PO TABS
10.0000 mg | ORAL_TABLET | Freq: Two times a day (BID) | ORAL | Status: DC
  Administered 2022-01-22: 10 mg via ORAL
  Filled 2022-01-22: qty 2

## 2022-01-22 MED ORDER — APIXABAN 5 MG PO TABS: 5.0000 mg | ORAL_TABLET | Freq: Two times a day (BID) | ORAL | Status: DC

## 2022-01-22 NOTE — Discharge Instructions (Addendum)
Information on my medicine - ELIQUIS (apixaban)  This medication education was reviewed with me or my healthcare representative as part of my discharge preparation.    Why was Eliquis prescribed for you? Eliquis was prescribed to treat blood clots that may have been found in the veins of your legs (deep vein thrombosis) or in your lungs (pulmonary embolism) and to reduce the risk of them occurring again.  What do You need to know about Eliquis ? The starting dose is 10 mg (two 5 mg tablets) taken TWICE daily for the FIRST SEVEN (7) DAYS, then on June 27th  the dose is reduced to ONE 5 mg tablet taken TWICE daily.  Eliquis may be taken with or without food.   Try to take the dose about the same time in the morning and in the evening. If you have difficulty swallowing the tablet whole please discuss with your pharmacist how to take the medication safely.  Take Eliquis exactly as prescribed and DO NOT stop taking Eliquis without talking to the doctor who prescribed the medication.  Stopping may increase your risk of developing a new blood clot.  Refill your prescription before you run out.  After discharge, you should have regular check-up appointments with your healthcare provider that is prescribing your Eliquis.    What do you do if you miss a dose? If a dose of ELIQUIS is not taken at the scheduled time, take it as soon as possible on the same day and twice-daily administration should be resumed. The dose should not be doubled to make up for a missed dose.  Important Safety Information A possible side effect of Eliquis is bleeding. You should call your healthcare provider right away if you experience any of the following: Bleeding from an injury or your nose that does not stop. Unusual colored urine (red or dark brown) or unusual colored stools (red or black). Unusual bruising for unknown reasons. A serious fall or if you hit your head (even if there is no bleeding).  Some  medicines may interact with Eliquis and might increase your risk of bleeding or clotting while on Eliquis. To help avoid this, consult your healthcare provider or pharmacist prior to using any new prescription or non-prescription medications, including herbals, vitamins, non-steroidal anti-inflammatory drugs (NSAIDs) and supplements.  This website has more information on Eliquis (apixaban): http://www.eliquis.com/eliquis/home

## 2022-01-22 NOTE — Progress Notes (Addendum)
ANTICOAGULATION CONSULT NOTE follow up  Pharmacy Consult for Heparin >> Eliquis  Indication: pulmonary embolism  Allergies  Allergen Reactions   Ciprofloxacin     Can not take due to hx of Cdiff per patient   Wasp Venom Other (See Comments)    dizziness   Fluconazole Rash    Patient Measurements: Height: 5\' 5"  (165.1 cm) Weight: 70.3 kg (155 lb) IBW/kg (Calculated) : 57 Heparin Dosing Weight: 70.3 kg  Vital Signs: Temp: 98.2 F (36.8 C) (06/20 0447) Temp Source: Oral (06/20 0447) BP: 130/63 (06/20 0447) Pulse Rate: 56 (06/20 0447)  Labs: Recent Labs    01/19/22 1237 01/19/22 1320 01/19/22 1452 01/19/22 2355 01/20/22 0750 01/20/22 1229 01/20/22 1244 01/21/22 0334 01/22/22 0418  HGB 12.3  --   --   --  10.9*  --   --  10.7*  --   HCT 38.1  --   --   --  34.1*  --   --  34.5*  --   PLT 142*  --   --   --  122*  --   --  125*  --   LABPROT  --  13.5  --   --   --   --   --   --   --   INR  --  1.0  --   --   --   --   --   --   --   HEPARINUNFRC  --   --   --    < > 0.75*  --  0.59 0.46 0.53  CREATININE 0.92  --   --   --  0.80  --   --  0.74  --   TROPONINIHS 31*  --  28*  --   --  13  --  9  --    < > = values in this interval not displayed.     Estimated Creatinine Clearance: 52.4 mL/min (by C-G formula based on SCr of 0.74 mg/dL).   Medical History: Past Medical History:  Diagnosis Date   Anginal pain (HCC)    Angio-edema    Arthritis    Atypical chest pain 07/06/2009   Qualifier: Diagnosis of  By: 14/09/2008, MD, Christopher     GERD (gastroesophageal reflux disease)    Hyperlipemia    Migraines    Toxic effect of venom of bees, unintentional 06/03/2017    Medications:  Medications Prior to Admission  Medication Sig Dispense Refill Last Dose   aspirin 81 MG EC tablet Take 81 mg by mouth daily. Swallow whole.   Past Week   atorvastatin (LIPITOR) 20 MG tablet Take 1 tablet (20 mg total) by mouth daily after supper. 30 tablet 0 Past Week    carbidopa-levodopa (SINEMET IR) 25-100 MG tablet TAKE 1 AND 1/2 TABLETS BY MOUTH THREE TIMES DAILY 270 tablet 0 01/19/2022   CRANBERRY PO Take 300 mg daily by mouth.   Past Week   Cyanocobalamin (VITAMIN B-12 PO) Take 1,200 mcg daily by mouth.    Past Week   EPINEPHrine 0.3 mg/0.3 mL IJ SOAJ injection Inject 0.3 mg into the muscle once as needed for up to 1 dose (anaphylaxis). 2 each 1 unk   Ergocalciferol (VITAMIN D2) 2000 units TABS Take 2,000 Units daily by mouth.    Past Week   furosemide (LASIX) 20 MG tablet Take 20 mg by mouth 2 (two) times a week. Take 1 tablet  M, F   Past Week   KLOR-CON  M10 10 MEQ tablet 10 mEq 2 (two) times a week.   Past Week   omeprazole (PRILOSEC) 40 MG capsule Take 40 mg by mouth continuous as needed (indigestion).   Past Month   propranolol ER (INDERAL LA) 80 MG 24 hr capsule TAKE 1 CAPSULE(80 MG) BY MOUTH DAILY (Patient taking differently: Take 80 mg by mouth daily.) 90 capsule 3 Past Week   saccharomyces boulardii (FLORASTOR) 250 MG capsule Take 250 mg by mouth 3 (three) times a week.   Past Week    Assessment: 89 yof with a history of HLD, thoracic aortic aneurysm, migraine, coronary calcification on CT. Patient is presenting with fatigue, cough, dyspnea, nausea, and chest tightness. Heparin per pharmacy consult placed for pulmonary embolus.  CTA PE w/ acute PE w/ evidence of RHS - stable ascending thoracic aortic aneurysm noted as well  Patient is not on anticoagulation prior to arrival.    Today, 01/22/2022: - HL 0.53 therapeutic on 1100 units/hr - Hgb 12.3 >> 10.7 ( 6/19) platelets 142 >> 125 ( 6/19)  - No bleeding  or lin eissues reported - Pt is also being transitioned to apixaban   Goal of Therapy:  Heparin level 0.3-0.7 units/ml Monitor platelets by anticoagulation protocol: Yes   Plan:  - Stop heparin drip and start apixaban 10 mg PO BID x 7 days then 5 mg pO BID  - Monitor renal function, CBC  - Monitor for signs and symptoms of  bleeding   Adalberto Cole, PharmD, BCPS 01/22/2022 8:41 AM

## 2022-01-22 NOTE — Plan of Care (Signed)

## 2022-01-22 NOTE — Discharge Summary (Signed)
Physician Discharge Summary  Linda Lee EUM:[id number] DOB: [birth year] DOA: 01/19/2022  PCP: Rodrigo Ran, MD  Admit date: 01/19/2022 Discharge date: 01/22/2022  Admitted From: Home Disposition: Home  Recommendations for Outpatient Follow-up:  Follow up with PCP in 1-2 weeks Please obtain BMP/CBC in one week   Home Health: None Equipment/Devices: None  Discharge Condition: stAble CODE STATUS full code Diet recommendation: Cardiac Brief/Interim Summary: 86 year old female with past medical history of Parkinson disease, lumbar spinal stenosis, sialorrhea,ascending thoracic aortic aneurysm (4.5cm), complex migraine headaches, hyperlipidemia, gastroesophageal reflux disease who presents to [facility] emergency department complaining of dyspnea on exertion, chest discomfort and nausea.   Patient explains that for approximately the past 2 to 3 days she has been experiencing shortness of breath.  Shortness of breath is worse with exertion and improved with rest.  As the days progressed patient's shortness of breath became associated with chest discomfort.  Patient describes this chest discomfort is right-sided, tight in quality and moderate in intensity.  Discomfort seems to occur more with exertion and improved with rest.  Patient complains of associated dry cough but denies fevers.  Finally, patient also recalls some associated bilateral lower extremity swelling several days ago but this seems to have improved.  Patient denies sick contacts recent travel or contact with confirmed COVID-19 infection   Upon evaluation in the emergency department CT angiogram of the chest revealed an acute pulmonary embolism with evidence of right heart strain and RV/LV ratio of 1.12 concerning for submassive pulmonary embolism.  Additionally there were wedge-shaped densities in the right lower lobe suspicious for pulmonary infarction.  ER provider then initiated a heparin infusion.  Hospitalist  group was then called and patient was accepted for transfer to [facility] hospital for continued medical care. She has a history of C. difficile in 2018 from that time she was on ciprofloxacin COVID-negative this admission  Discharge Diagnoses:  Principal Problem:   Acute pulmonary embolism (HCC) Active Problems:   Parkinson disease (HCC)   Elevated troponin level not due myocardial infarction   Asymptomatic bacteriuria   Mixed hyperlipidemia   GERD without esophagitis     #1 acute bilateral pulmonary embolism unclear as to what provoked this.  She has no recent history of travel malignancy COVID.  She was on 2 L of oxygen initially and then was on room air at the time of discharge.  She did not desaturate with ambulation.  She was seen in consultation by PCCM.  She was treated with heparin drip for 48 hours.   Echo-normal ejection fraction, elevated pulmonary artery pressure Venous Doppler no evidence of DVT. she was discharged on Eliquis.    #2 history of Parkinson's disease on Sinemet   #3 asymptomatic bacteriuria she was not given any antibiotics.   #4 hyperlipidemia continue Lipitor   #5 GERD continue home meds   #6 elevated troponin likely from demand ischemia   #7 hypertension on propanolol and Lasix twice a week   Estimated body mass index is 25.79 kg/m as calculated from the following:   Height as of this encounter: 5\' 5"  (1.651 m).   Weight as of this encounter: 70.3 kg.  Discharge Instructions  Discharge Instructions     Diet - low sodium heart healthy   Complete by: As directed    Increase activity slowly   Complete by: As directed       Allergies as of 01/22/2022       Reactions   Ciprofloxacin  Can not take due to hx of Cdiff per patient   Wasp Venom Other (See Comments)   dizziness   Fluconazole Rash        Medication List     STOP taking these medications    aspirin EC 81 MG tablet       TAKE these medications    Apixaban  Starter Pack (10mg  and 5mg ) Commonly known as: ELIQUIS STARTER PACK Take as directed on package: start with two-5mg  tablets twice daily for 7 days. On day 8, switch to one-5mg  tablet twice daily.   atorvastatin 20 MG tablet Commonly known as: LIPITOR Take 1 tablet (20 mg total) by mouth daily after supper.   carbidopa-levodopa 25-100 MG tablet Commonly known as: SINEMET IR TAKE 1 AND 1/2 TABLETS BY MOUTH THREE TIMES DAILY   CRANBERRY PO Take 300 mg daily by mouth.   EPINEPHrine 0.3 mg/0.3 mL Soaj injection Commonly known as: EPI-PEN Inject 0.3 mg into the muscle once as needed for up to 1 dose (anaphylaxis).   furosemide 20 MG tablet Commonly known as: LASIX Take 20 mg by mouth 2 (two) times a week. Take 1 tablet  M, F   Klor-Con M10 10 MEQ tablet Generic drug: potassium chloride 10 mEq 2 (two) times a week.   omeprazole 40 MG capsule Commonly known as: PRILOSEC Take 40 mg by mouth continuous as needed (indigestion).   polyethylene glycol 17 g packet Commonly known as: MIRALAX / GLYCOLAX Take 17 g by mouth daily as needed for mild constipation.   propranolol ER 80 MG 24 hr capsule Commonly known as: INDERAL LA TAKE 1 CAPSULE(80 MG) BY MOUTH DAILY What changed: See the new instructions.   saccharomyces boulardii 250 MG capsule Commonly known as: FLORASTOR Take 250 mg by mouth 3 (three) times a week.   VITAMIN B-12 PO Take 1,200 mcg daily by mouth.   Vitamin D2 50 MCG (2000 UT) Tabs Take 2,000 Units daily by mouth.        Follow-up Information     M, MD Follow up.   Specialty: Internal Medicine Contact information: [address] [place] [address] [place] [phone]                Allergies  Allergen Reactions   Ciprofloxacin     Can not take due to hx of Cdiff per patient   Wasp Venom Other (See Comments)    dizziness   Fluconazole Rash    Consultations: PCCM   Procedures/Studies: VAS 16109 LOWER EXTREMITY VENOUS  (DVT)  Result Date: 01/21/2022  Lower Venous DVT Study Patient Name:  Linda Lee  Date of Exam:   01/21/2022 Medical Rec #: Linda Lee           Accession #:    01/23/2022 Date of Birth: October 04, 1935          Patient Gender: F Patient Age:   53 years Exam Location:  [facility] Procedure:      VAS 91 LOWER EXTREMITY VENOUS (DVT) Referring Phys: [facility] Bishoy Cupp --------------------------------------------------------------------------------  Indications: Pulmonary embolism.  Comparison Study: No previous exams Performing Technologist: Jody Hill RVT, RDMS  Examination Guidelines: A complete evaluation includes B-mode imaging, spectral Doppler, color Doppler, and power Doppler as needed of all accessible portions of each vessel. Bilateral testing is considered an integral part of a complete examination. Limited examinations for reoccurring indications may be performed as noted. The reflux portion of the exam is performed with the patient in reverse Trendelenburg.  +---------+---------------+---------+-----------+----------+--------------+  RIGHT    CompressibilityPhasicitySpontaneityPropertiesThrombus Aging +---------+---------------+---------+-----------+----------+--------------+ CFV      Full           No       Yes                                 +---------+---------------+---------+-----------+----------+--------------+ SFJ      Full                                                        +---------+---------------+---------+-----------+----------+--------------+ FV Prox  Full           No       Yes                                 +---------+---------------+---------+-----------+----------+--------------+ FV Mid   Full           No       Yes                                 +---------+---------------+---------+-----------+----------+--------------+ FV DistalFull           No       Yes                                  +---------+---------------+---------+-----------+----------+--------------+ PFV      Full                                                        +---------+---------------+---------+-----------+----------+--------------+ POP      Full           No       Yes                                 +---------+---------------+---------+-----------+----------+--------------+ PTV      Full                                                        +---------+---------------+---------+-----------+----------+--------------+ PERO     Full                                                        +---------+---------------+---------+-----------+----------+--------------+   +---------+---------------+---------+-----------+----------+--------------+ LEFT     CompressibilityPhasicitySpontaneityPropertiesThrombus Aging +---------+---------------+---------+-----------+----------+--------------+ CFV      Full           No       Yes                                 +---------+---------------+---------+-----------+----------+--------------+  SFJ      Full                                                        +---------+---------------+---------+-----------+----------+--------------+ FV Prox  Full           No       Yes                                 +---------+---------------+---------+-----------+----------+--------------+ FV Mid   Full           No       Yes                                 +---------+---------------+---------+-----------+----------+--------------+ FV DistalFull           No       Yes                                 +---------+---------------+---------+-----------+----------+--------------+ PFV      Full                                                        +---------+---------------+---------+-----------+----------+--------------+ POP      Full           No       Yes                                  +---------+---------------+---------+-----------+----------+--------------+ PTV      Full                                                        +---------+---------------+---------+-----------+----------+--------------+ PERO     Full                                                        +---------+---------------+---------+-----------+----------+--------------+     Summary: BILATERAL: - No evidence of deep vein thrombosis seen in the lower extremities, bilaterally. -Pulsatile doppler waveforms, bilaterally. - RIGHT: - No cystic structure found in the popliteal fossa.  LEFT: - Cystic structures found in the popliteal fossa.  *See table(s) above for measurements and observations. Electronically signed by Sherald Hess MD on 01/21/2022 at 7:52:58 PM.    Final    CT ABDOMEN PELVIS W CONTRAST  Result Date: 01/21/2022 CLINICAL DATA:  Nonlocalized abdomen pain. EXAM: CT ABDOMEN AND PELVIS WITH CONTRAST TECHNIQUE: Multidetector CT imaging of the abdomen and pelvis was performed using the standard protocol following bolus administration of intravenous contrast. RADIATION DOSE REDUCTION: This exam was performed according to the departmental dose-optimization program  which includes automated exposure control, adjustment of the mA and/or kV according to patient size and/or use of iterative reconstruction technique. CONTRAST:  100mL OMNIPAQUE IOHEXOL 300 MG/ML  SOLN COMPARISON:  MR of the abdomen January 15, 2020 FINDINGS: Lower chest: Minimal bilateral pleural effusions are identified. Mild atelectasis/scar of lung bases are noted. The heart size is normal. Hepatobiliary: Multiple simple cysts are identified within the liver, largest in the anterior segment of right lobe liver measuring 6.1 x 5.3 cm. The gallbladder and biliary tree are normal. Pancreas: Unremarkable. No pancreatic ductal dilatation or surrounding inflammatory changes. Spleen: Normal in size without focal abnormality. Adrenals/Urinary Tract:  Adrenal glands are unremarkable. Kidneys are normal, without renal calculi, focal lesion, or hydronephrosis. Bladder is unremarkable. Stomach/Bowel: Appendix appears normal. No evidence of bowel wall thickening, distention, or inflammatory changes. There is diverticulosis of colon. Small hiatal hernia is identified. Stomach is otherwise within normal limits. Vascular/Lymphatic: Aortic atherosclerosis. No enlarged abdominal or pelvic lymph nodes. Reproductive: Uterus and bilateral adnexa are unremarkable. Other: Umbilical herniation of mesenteric fat identified. Musculoskeletal: Degenerative joint changes at L4-5 are noted. IMPRESSION: 1. No acute abnormality identified in the abdomen and pelvis. 2. Multiple simple cysts within the liver. 3. Diverticulosis of colon without evidence of diverticulitis. 4. Minimal bilateral pleural effusions with mild atelectasis/scar of lung bases. Aortic Atherosclerosis (ICD10-I70.0). Electronically Signed   By: Sherian ReinWei-Chen  Lin M.D.   On: 01/21/2022 14:51   ECHOCARDIOGRAM COMPLETE  Result Date: 01/20/2022    ECHOCARDIOGRAM REPORT   Patient Name:   Linda PlowmanHELEN M Lee Date of Exam: 01/20/2022 Medical Rec #:  [id number]          Height:       65.0 in Accession #:    [id number]         Weight:       155.0 lb Date of Birth:  25-May-1936         BSA:          1.775 m Patient Age:    85 years           BP:           154/69 mmHg Patient Gender: F                  HR:           59 bpm. Exam Location:  Inpatient Procedure: 2D Echo, Cardiac Doppler and Color Doppler Indications:     Pulmonary Embolus I26.09  History:         Patient has prior history of Echocardiogram examinations, most                  recent 09/05/2015. Risk Factors:Dyslipidemia. Ascending thoracic                  aorta aneurysm 4.5 cm. GERD.  Sonographer:     Leta Junglingiffany Cooper RDCS Referring Phys:  [id number] Deno LungerGEORGE J [facility] HospitalHALHOUB Diagnosing Phys: Yates DecampJay Ganji MD IMPRESSIONS  1. Left ventricular ejection fraction, by estimation, is 60 to  65%. The left ventricle has normal function. The left ventricle has no regional wall motion abnormalities. Left ventricular diastolic parameters are consistent with Grade I diastolic dysfunction (impaired relaxation).  2. Right ventricular systolic function is normal. The right ventricular size is normal. There is severely elevated pulmonary artery systolic pressure. The estimated right ventricular systolic pressure is 77.4 mmHg.  3. The mitral valve is normal in structure. Trivial mitral valve regurgitation.  4. Tricuspid valve regurgitation is moderate.  5. The aortic valve is tricuspid. Aortic valve regurgitation is mild.  6. There is mild dilatation of the ascending aorta, measuring 43 mm.  7. The inferior vena cava is dilated in size with <50% respiratory variability, suggesting right atrial pressure of 15 mmHg. FINDINGS  Left Ventricle: Left ventricular ejection fraction, by estimation, is 60 to 65%. The left ventricle has normal function. The left ventricle has no regional wall motion abnormalities. The left ventricular internal cavity size was normal in size. There is  no left ventricular hypertrophy. Left ventricular diastolic parameters are consistent with Grade I diastolic dysfunction (impaired relaxation). Normal left ventricular filling pressure. Right Ventricle: The right ventricular size is normal. No increase in right ventricular wall thickness. Right ventricular systolic function is normal. There is severely elevated pulmonary artery systolic pressure. The tricuspid regurgitant velocity is 3.95 m/s, and with an assumed right atrial pressure of 15 mmHg, the estimated right ventricular systolic pressure is 77.4 mmHg. Left Atrium: Left atrial size was normal in size. Right Atrium: Right atrial size was normal in size. Pericardium: There is no evidence of pericardial effusion. Mitral Valve: The mitral valve is normal in structure. Trivial mitral valve regurgitation. Tricuspid Valve: The tricuspid valve  is normal in structure. Tricuspid valve regurgitation is moderate. Aortic Valve: The aortic valve is tricuspid. Aortic valve regurgitation is mild. Aortic regurgitation PHT measures 533 msec. Pulmonic Valve: The pulmonic valve was normal in structure. Pulmonic valve regurgitation is trivial. No evidence of pulmonic stenosis. Aorta: The aortic root is normal in size and structure. There is mild dilatation of the ascending aorta, measuring 43 mm. Venous: The inferior vena cava is dilated in size with less than 50% respiratory variability, suggesting right atrial pressure of 15 mmHg. IAS/Shunts: No atrial level shunt detected by color flow Doppler.  LEFT VENTRICLE PLAX 2D LVIDd:         4.90 cm   Diastology LVIDs:         3.60 cm   LV e' medial:    6.31 cm/s LV PW:         0.80 cm   LV E/e' medial:  8.6 LV IVS:        0.80 cm   LV e' lateral:   5.43 cm/s LVOT diam:     2.00 cm   LV E/e' lateral: 10.0 LV SV:         79 LV SV Index:   45 LVOT Area:     3.14 cm  RIGHT VENTRICLE RV S prime:     14.60 cm/s TAPSE (M-mode): 2.0 cm LEFT ATRIUM             Index        RIGHT ATRIUM           Index LA diam:        2.70 cm 1.52 cm/m   RA Area:     15.70 cm LA Vol (A2C):   30.8 ml 17.35 ml/m  RA Volume:   33.70 ml  18.99 ml/m LA Vol (A4C):   34.9 ml 19.66 ml/m LA Biplane Vol: 35.5 ml 20.00 ml/m  AORTIC VALVE LVOT Vmax:   111.00 cm/s LVOT Vmean:  61.200 cm/s LVOT VTI:    0.253 m AI PHT:      533 msec  AORTA Ao Root diam: 3.40 cm Ao Asc diam:  4.30 cm MITRAL VALVE               TRICUSPID VALVE MV Area (PHT): 2.87  cm    TR Peak grad:   62.4 mmHg MV Decel Time: 264 msec    TR Vmax:        395.00 cm/s MV E velocity: 54.40 cm/s MV A velocity: 78.40 cm/s  SHUNTS MV E/A ratio:  0.69        Systemic VTI:  0.25 m                            Systemic Diam: 2.00 cm Yates Decamp MD Electronically signed by Yates Decamp MD Signature Date/Time: 01/20/2022/9:28:23 PM    Final    CT Angio Chest PE W and/or Wo Contrast  Result Date:  01/19/2022 CLINICAL DATA:  Lightheadedness and nausea for the past 2 days. EXAM: CT ANGIOGRAPHY CHEST WITH CONTRAST TECHNIQUE: Multidetector CT imaging of the chest was performed using the standard protocol during bolus administration of intravenous contrast. Multiplanar CT image reconstructions and MIPs were obtained to evaluate the vascular anatomy. RADIATION DOSE REDUCTION: This exam was performed according to the departmental dose-optimization program which includes automated exposure control, adjustment of the mA and/or kV according to patient size and/or use of iterative reconstruction technique. CONTRAST:  75mL OMNIPAQUE IOHEXOL 350 MG/ML SOLN COMPARISON:  Chest radiographs obtained earlier today. Chest CTA dated 09/25/2021. FINDINGS: Cardiovascular: Multiple bilateral pulmonary arterial filling defects, including the distal right main pulmonary artery. The right ventricular to left ventricular ratio is 1.12. Interval increase in transverse diameter of the main pulmonary artery, currently measuring 3.5 cm, previously 3.2 cm. 4.3 cm ascending thoracic aortic aneurysm, previously shown to be stable for 10 years. Therefore, this does not need further follow-up unless clinically indicated. Atheromatous calcifications, including the coronary arteries and aorta. Mediastinum/Nodes: No enlarged mediastinal, hilar, or axillary lymph nodes. Thyroid gland, trachea, and esophagus demonstrate no significant findings. Small hiatal hernia. Lungs/Pleura: Minimal bilateral dependent atelectasis. Interval wedge-shaped area of consolidation in the anteromedial aspect of the right lower lobe with an air bronchogram. Interval minimal irregular patchy density more superiorly and laterally in the right lower lobe. Upper Abdomen: Stable liver cysts. Musculoskeletal: Mild thoracic and upper lumbar spine degenerative changes. Review of the MIP images confirms the above findings. IMPRESSION: 1. Positive for acute PE with CT evidence of  right heart strain (RV/LV Ratio = 1.12) consistent with at least submassive (intermediate risk) PE. The presence of right heart strain has been associated with an increased risk of morbidity and mortality. Please refer to the "Code PE Focused" order set in EPIC. 2. Interval wedge-shaped and patchy densities in the right lower lobe, suspicious for pulmonary infarction. 3. Interval increased enlargement of the central pulmonary arteries, compatible with pulmonary arterial hypertension associated with the pulmonary emboli. 4.  Calcific coronary artery and aortic atherosclerosis. 5. Stable 4.3 cm ascending thoracic aortic aneurysm, not needing further follow-up unless otherwise clinically indicated. Critical Value/emergent results were called by telephone at the time of interpretation on 01/19/2022 at 4:19 pm to provider Carlyle Dolly, MD, who verbally acknowledged these results. Aortic Atherosclerosis (ICD10-I70.0). Electronically Signed   By: Beckie Salts M.D.   On: 01/19/2022 16:25   DG Chest 2 View  Result Date: 01/19/2022 CLINICAL DATA:  86 year old female with history of shortness of breath. EXAM: CHEST - 2 VIEW COMPARISON:  Chest x-ray 08/05/2017. FINDINGS: Ill-defined opacity in the medial segment of the right middle lobe concerning for probable bronchopneumonia. Lung volumes are normal. No pleural effusions. No pneumothorax. No pulmonary nodule or mass noted. Pulmonary vasculature and  the cardiomediastinal silhouette are within normal limits. Atherosclerotic calcifications in the thoracic aorta. IMPRESSION: 1. Findings are concerning for bronchopneumonia in the medial segment of the right middle lobe. Followup PA and lateral chest X-ray is recommended in 3-4 weeks following trial of antibiotic therapy to ensure resolution and exclude underlying malignancy. 2. Aortic atherosclerosis. Electronically Signed   By: Trudie Reed M.D.   On: 01/19/2022 13:31   (Echo, Carotid, EGD, Colonoscopy, ERCP)     Subjective: Patient is resting in bed anxious to go home family by the bedside  Discharge Exam: Vitals:   01/22/22 0300 01/22/22 0447  BP:  130/63  Pulse:  (!) 56  Resp:  20  Temp:  98.2 F (36.8 C)  SpO2: 93% 93%   Vitals:   01/21/22 1325 01/21/22 2123 01/22/22 0300 01/22/22 0447  BP: (!) 156/72 (!) 131/59  130/63  Pulse: (!) 55 (!) 55  (!) 56  Resp: 18 17  20   Temp: 97.7 F (36.5 C) 97.6 F (36.4 C)  98.2 F (36.8 C)  TempSrc: Oral Oral  Oral  SpO2: 97% 94% 93% 93%  Weight:      Height:        General: Pt is alert, awake, not in acute distress Cardiovascular: RRR, S1/S2 +, no rubs, no gallops Respiratory: CTA bilaterally, no wheezing, no rhonchi Abdominal: Soft, NT, ND, bowel sounds + Extremities: no edema, no cyanosis    The results of significant diagnostics from this hospitalization (including imaging, microbiology, ancillary and laboratory) are listed below for reference.     Microbiology: Recent Results (from the past 240 hour(s))  Blood culture (routine x 2)     Status: None (Preliminary result)   Collection Time: 01/19/22  1:56 PM   Specimen: Right Antecubital; Blood  Result Value Ref Range Status   Specimen Description   Final    RIGHT ANTECUBITAL BLOOD Performed at [facility], [address] [place], [place]    Special Requests   Final    Blood Culture adequate volume BOTTLES DRAWN AEROBIC AND ANAEROBIC Performed at [facility], [address] [place], [place]    Culture   Final    NO GROWTH 3 DAYS Performed at [facility], [address] [place], [place]    Report Status PENDING  Incomplete  Blood culture (routine x 2)     Status: None (Preliminary result)   Collection Time: 01/19/22  1:58 PM   Specimen: BLOOD RIGHT ARM  Result Value Ref Range Status   Specimen Description   Final    BLOOD RIGHT ARM BLOOD Performed at [facility], [address] [place], [place]    Special Requests   Final    Blood Culture adequate volume BOTTLES DRAWN AEROBIC AND ANAEROBIC Performed at [facility], [address], [place], [place]    Culture   Final    NO GROWTH 3 DAYS Performed at [facility], [address] [place], [place]    Report Status PENDING  Incomplete  SARS Coronavirus 2 by RT PCR (hospital order, performed in [facility] hospital lab) *cepheid single result test* Anterior Nasal Swab     Status: None   Collection Time: 01/19/22  4:21 PM   Specimen: Anterior Nasal Swab  Result Value Ref Range Status   SARS Coronavirus 2 by RT PCR NEGATIVE NEGATIVE Final  Comment: (NOTE) SARS-CoV-2 target nucleic acids are NOT DETECTED.  The SARS-CoV-2 RNA is generally detectable in upper and lower respiratory specimens during the acute phase of infection. The lowest concentration of SARS-CoV-2 viral copies this assay can detect is 250 copies / mL. A negative result does not preclude SARS-CoV-2 infection and should not be used as the sole basis for treatment or other patient management decisions.  A negative result may occur with improper specimen collection / handling, submission of specimen other than nasopharyngeal swab, presence of viral mutation(s) within the areas targeted by this assay, and inadequate number of viral copies (<250 copies / mL). A negative result must be combined with clinical observations, patient history, and epidemiological information.  Fact Sheet for Patients:   RoadLapTop.co.za  Fact Sheet for Healthcare Providers: http://kim-miller.com/  This test is not yet approved or  cleared by the [place] FDA and has been authorized for detection and/or diagnosis of SARS-CoV-2 by FDA under an Emergency Use Authorization (EUA).  This EUA will remain in effect (meaning this test can be used) for the duration of the COVID-19 declaration  under Section 564(b)(1) of the Act, 21 U.S.C. section 360bbb-3(b)(1), unless the authorization is terminated or revoked sooner.  Performed at [facility], [address] [place], [place] [address]      Labs: BNP (last 3 results) Recent Labs    01/20/22 1229 01/21/22 0334 01/22/22 0418  BNP 204.0* 157.8* 142.6*   Basic Metabolic Panel: Recent Labs  Lab 01/19/22 1237 01/20/22 0750 01/21/22 0334  NA 142 143 142  K 4.1 3.6 4.2  CL 109 110 111  CO2 25 25 23   GLUCOSE 109* 95 107*  BUN 21 18 19   CREATININE 0.92 0.80 0.74  CALCIUM 8.5* 8.1* 8.3*  MG  --  2.1  --    Liver Function Tests: Recent Labs  Lab 01/19/22 1237 01/20/22 0750 01/21/22 0334  AST 12* 9* 8*  ALT <5 <5 5  ALKPHOS 100 86 77  BILITOT 0.8 0.9 0.6  PROT 6.4* 5.9* 5.5*  ALBUMIN 3.4* 3.2* 2.9*   No results for input(s): "LIPASE", "AMYLASE" in the last 168 hours. No results for input(s): "AMMONIA" in the last 168 hours. CBC: Recent Labs  Lab 01/19/22 1237 01/20/22 0750 01/21/22 0334  WBC 5.2 4.2 4.7  NEUTROABS  --  2.9  --   HGB 12.3 10.9* 10.7*  HCT 38.1 34.1* 34.5*  MCV 95.5 96.6 98.9  PLT 142* 122* 125*   Cardiac Enzymes: No results for input(s): "CKTOTAL", "CKMB", "CKMBINDEX", "TROPONINI" in the last 168 hours. BNP: Invalid input(s): "POCBNP" CBG: Recent Labs  Lab 01/19/22 1219  GLUCAP 102*   D-Dimer Recent Labs    01/21/22 0334 01/22/22 0418  DDIMER 1.49* 1.42*   Hgb A1c No results for input(s): "HGBA1C" in the last 72 hours. Lipid Profile No results for input(s): "CHOL", "HDL", "LDLCALC", "TRIG", "CHOLHDL", "LDLDIRECT" in the last 72 hours. Thyroid function studies No results for input(s): "TSH", "T4TOTAL", "T3FREE", "THYROIDAB" in the last 72 hours.  Invalid input(s): "FREET3" Anemia work up No results for input(s): "VITAMINB12", "FOLATE", "FERRITIN", "TIBC", "IRON", "RETICCTPCT" in the last 72 hours. Urinalysis    Component Value Date/Time   COLORURINE  YELLOW 01/19/2022 1320   APPEARANCEUR HAZY (A) 01/19/2022 1320   LABSPEC 1.025 01/19/2022 1320   PHURINE 5.5 01/19/2022 1320   GLUCOSEU NEGATIVE 01/19/2022 1320   HGBUR NEGATIVE 01/19/2022 1320   BILIRUBINUR NEGATIVE 01/19/2022 1320   KETONESUR 15 (A) 01/19/2022  1320   PROTEINUR 30 (A) 01/19/2022 1320   UROBILINOGEN 0.2 09/04/2012 0159   NITRITE POSITIVE (A) 01/19/2022 1320   LEUKOCYTESUR SMALL (A) 01/19/2022 1320   Sepsis Labs Recent Labs  Lab 01/19/22 1237 01/20/22 0750 01/21/22 0334  WBC 5.2 4.2 4.7   Microbiology Recent Results (from the past 240 hour(s))  Blood culture (routine x 2)     Status: None (Preliminary result)   Collection Time: 01/19/22  1:56 PM   Specimen: Right Antecubital; Blood  Result Value Ref Range Status   Specimen Description   Final    RIGHT ANTECUBITAL BLOOD Performed at [facility], [address] [place], [place] [address]    Special Requests   Final    Blood Culture adequate volume BOTTLES DRAWN AEROBIC AND ANAEROBIC Performed at [facility], [address], [place], [place] [address]    Culture   Final    NO GROWTH 3 DAYS Performed at [facility], [address] [place], [place] [address]    Report Status PENDING  Incomplete  Blood culture (routine x 2)     Status: None (Preliminary result)   Collection Time: 01/19/22  1:58 PM   Specimen: BLOOD RIGHT ARM  Result Value Ref Range Status   Specimen Description   Final    BLOOD RIGHT ARM BLOOD Performed at [facility], [address] [place], [place] [address]    Special Requests   Final    Blood Culture adequate volume BOTTLES DRAWN AEROBIC AND ANAEROBIC Performed at [facility], [address], [place], [place] [address]    Culture   Final    NO GROWTH 3 DAYS Performed at [facility], [address], [place], [place] [address]    Report Status PENDING  Incomplete  SARS Coronavirus 2 by RT PCR (hospital order,  performed in [facility] hospital lab) *cepheid single result test* Anterior Nasal Swab     Status: None   Collection Time: 01/19/22  4:21 PM   Specimen: Anterior Nasal Swab  Result Value Ref Range Status   SARS Coronavirus 2 by RT PCR NEGATIVE NEGATIVE Final    Comment: (NOTE) SARS-CoV-2 target nucleic acids are NOT DETECTED.  The SARS-CoV-2 RNA is generally detectable in upper and lower respiratory specimens during the acute phase of infection. The lowest concentration of SARS-CoV-2 viral copies this assay can detect is 250 copies / mL. A negative result does not preclude SARS-CoV-2 infection and should not be used as the sole basis for treatment or other patient management decisions.  A negative result may occur with improper specimen collection / handling, submission of specimen other than nasopharyngeal swab, presence of viral mutation(s) within the areas targeted by this assay, and inadequate number of viral copies (<250 copies / mL). A negative result must be combined with clinical observations, patient history, and epidemiological information.  Fact Sheet for Patients:   RoadLapTop.co.za  Fact Sheet for Healthcare Providers: http://kim-miller.com/  This test is not yet approved or  cleared by the [place] FDA and has been authorized for detection and/or diagnosis of SARS-CoV-2 by FDA under an Emergency Use Authorization (EUA).  This EUA will remain in effect (meaning this test can be used) for the duration of the COVID-19 declaration under Section 564(b)(1) of the Act, 21 U.S.C. section 360bbb-3(b)(1), unless the authorization is terminated or revoked sooner.  Performed at [facility], [address]  [place], [place] [address]      Time coordinating discharge: 39 minutes  SIGNED   Alwyn Ren, MD  [facility] 01/22/2022, 3:51 PM

## 2022-01-22 NOTE — Progress Notes (Signed)
Discharge instructions provided to and reviewed with patient and patient's family.  Verbalized understanding.  PIVs and cardiac monitoring removed.  Patient escorted to main entrance via wheelchair with belongings for transport home with family.  Bradd Burner, RN

## 2022-01-22 NOTE — Progress Notes (Signed)
Oxygen saturations remained >90% throughout night without oxygen.

## 2022-01-23 ENCOUNTER — Other Ambulatory Visit: Payer: Self-pay

## 2022-01-23 DIAGNOSIS — G20 Parkinson's disease: Secondary | ICD-10-CM

## 2022-01-23 MED ORDER — CARBIDOPA-LEVODOPA 25-100 MG PO TABS: ORAL_TABLET | ORAL | 0 refills | Status: DC

## 2022-01-24 LAB — CULTURE, BLOOD (ROUTINE X 2)
Culture: NO GROWTH
Culture: NO GROWTH
Special Requests: ADEQUATE
Special Requests: ADEQUATE

## 2022-01-28 DIAGNOSIS — Z1231 Encounter for screening mammogram for malignant neoplasm of breast: Secondary | ICD-10-CM | POA: Diagnosis not present

## 2022-01-29 DIAGNOSIS — I10 Essential (primary) hypertension: Secondary | ICD-10-CM | POA: Diagnosis not present

## 2022-01-29 DIAGNOSIS — K5790 Diverticulosis of intestine, part unspecified, without perforation or abscess without bleeding: Secondary | ICD-10-CM | POA: Diagnosis not present

## 2022-01-29 DIAGNOSIS — E785 Hyperlipidemia, unspecified: Secondary | ICD-10-CM | POA: Diagnosis not present

## 2022-01-29 DIAGNOSIS — R8271 Bacteriuria: Secondary | ICD-10-CM | POA: Diagnosis not present

## 2022-01-29 DIAGNOSIS — K219 Gastro-esophageal reflux disease without esophagitis: Secondary | ICD-10-CM | POA: Diagnosis not present

## 2022-01-29 DIAGNOSIS — R778 Other specified abnormalities of plasma proteins: Secondary | ICD-10-CM | POA: Diagnosis not present

## 2022-01-29 DIAGNOSIS — I2699 Other pulmonary embolism without acute cor pulmonale: Secondary | ICD-10-CM | POA: Diagnosis not present

## 2022-01-29 DIAGNOSIS — G20 Parkinson's disease: Secondary | ICD-10-CM | POA: Diagnosis not present

## 2022-01-30 NOTE — Progress Notes (Unsigned)
Assessment/Plan:   1.  Parkinsons Disease  -Continue carbidopa/levodopa 25/100, 1.5 tablets 3 times per day     2.  Severe spinal canal stenosis at L3-L4  -Follows with Dr. Venetia Maxon and seems to be doing ok in this regard.  3.  History of expressive aphasia, likely representing complicated migraine with left-sided paresthesias while in the office today.  -On aspirin and Lipitor.  Extensive negative work-up.  -having L sided paresthesias today.  This resolved before I even walked into the office, but happened when she was in my waiting room.  Neuro exam was nonfocal and nonlateralizing today, but nonetheless I was quite concerned.  Going to do CT brain stat today and she was encouraged to go to the emergency room should symptoms arise again.  She promised me she would.  4.  Sialorrhea  -This is commonly associated with PD.  We talked about treatments.  The patient is not a candidate for oral anticholinergic therapy because of increased risk of confusion and falls.  We discussed Botox (type A and B) and 1% atropine drops.  We discusssed that candy like lemon drops can help by stimulating mm of the oropharynx to induce swallowing.  She is not interested right now  5.    Pulmonary embolus  -This just occurred June, 2023.  Patient now on Eliquis.   Subjective:   Linda Lee was seen today in follow up for Parkinsons disease.  My previous records were reviewed prior to todays visit as well as outside records available to me.  Patient has not been seen in almost a year.  She was just recently in the hospital and discharged on June 20 for PE.  Patient is now on Eliquis.  Current prescribed movement disorder medications: Carbidopa/levodopa 25/100, 1.5 tablets 3 times per day    ALLERGIES:   Allergies  Allergen Reactions   Ciprofloxacin     Can not take due to hx of Cdiff per patient   Wasp Venom Other (See Comments)    dizziness   Fluconazole Rash    CURRENT MEDICATIONS:   Outpatient Encounter Medications as of 01/31/2022  Medication Sig   APIXABAN (ELIQUIS) VTE STARTER PACK (10MG  AND 5MG ) Take as directed on package: start with two-5mg  tablets twice daily for 7 days. On day 8, switch to one-5mg  tablet twice daily.   atorvastatin (LIPITOR) 20 MG tablet Take 1 tablet (20 mg total) by mouth daily after supper.   carbidopa-levodopa (SINEMET IR) 25-100 MG tablet TAKE 1 AND 1/2 TABLETS BY MOUTH THREE TIMES DAILY   CRANBERRY PO Take 300 mg daily by mouth.   Cyanocobalamin (VITAMIN B-12 PO) Take 1,200 mcg daily by mouth.    EPINEPHrine 0.3 mg/0.3 mL IJ SOAJ injection Inject 0.3 mg into the muscle once as needed for up to 1 dose (anaphylaxis).   Ergocalciferol (VITAMIN D2) 2000 units TABS Take 2,000 Units daily by mouth.    furosemide (LASIX) 20 MG tablet Take 20 mg by mouth 2 (two) times a week. Take 1 tablet  M, F   KLOR-CON M10 10 MEQ tablet 10 mEq 2 (two) times a week.   omeprazole (PRILOSEC) 40 MG capsule Take 40 mg by mouth continuous as needed (indigestion).   polyethylene glycol (MIRALAX / GLYCOLAX) 17 g packet Take 17 g by mouth daily as needed for mild constipation.   propranolol ER (INDERAL LA) 80 MG 24 hr capsule TAKE 1 CAPSULE(80 MG) BY MOUTH DAILY (Patient taking differently: Take 80 mg by mouth  daily.)   saccharomyces boulardii (FLORASTOR) 250 MG capsule Take 250 mg by mouth 3 (three) times a week.   No facility-administered encounter medications on file as of 01/31/2022.    Objective:   PHYSICAL EXAMINATION:    VITALS:   There were no vitals filed for this visit.    GEN:  The patient appears stated age and is in NAD. HEENT:  Normocephalic, atraumatic.  The mucous membranes are moist. The superficial temporal arteries are without ropiness or tenderness. CV:  RRR Lungs:  CTAB Neck/HEME:  There are no carotid bruits bilaterally.  Neurological examination:  Orientation: The patient is alert and oriented x3. Cranial nerves: There is good facial  symmetry without significant facial hypomimia. The speech is fluent and clear. Soft palate rises symmetrically and there is no tongue deviation. Hearing is intact to conversational tone. Sensation: Sensation is intact to light touch throughout.  No extinction with double simultaneous stimulation. Motor: Strength is 5/5 in the upper and lower extremities.  There is no pronator drift.  Movement examination: Tone: There is nl tone in the UE/LE Abnormal movements: rare tremor on the R Coordination:  There is no decremation today, with any form of RAMS, including alternating supination and pronation of the forearm, hand opening and closing, finger taps, heel taps and toe taps. Gait and Station: The patient has no difficulty arising out of a deep-seated chair without the use of the hands. The patient's stride length is normal today.  I have reviewed and interpreted the following labs independently    Chemistry      Component Value Date/Time   NA 142 01/21/2022 0334   K 4.2 01/21/2022 0334   CL 111 01/21/2022 0334   CO2 23 01/21/2022 0334   BUN 19 01/21/2022 0334   CREATININE 0.74 01/21/2022 0334      Component Value Date/Time   CALCIUM 8.3 (L) 01/21/2022 0334   ALKPHOS 77 01/21/2022 0334   AST 8 (L) 01/21/2022 0334   ALT 5 01/21/2022 0334   BILITOT 0.6 01/21/2022 0334       Lab Results  Component Value Date   WBC 4.7 01/21/2022   HGB 10.7 (L) 01/21/2022   HCT 34.5 (L) 01/21/2022   MCV 98.9 01/21/2022   PLT 125 (L) 01/21/2022      Total time spent on today's visit was *** minutes, including both face-to-face time and nonface-to-face time.  Time included that spent on review of records (prior notes available to me/labs/imaging if pertinent), discussing treatment and goals, answering patient's questions and coordinating care.  Cc:  Rodrigo Ran, MD

## 2022-01-31 ENCOUNTER — Encounter: Payer: Self-pay | Admitting: Neurology

## 2022-01-31 ENCOUNTER — Ambulatory Visit: Payer: Medicare PPO | Admitting: Neurology

## 2022-01-31 VITALS — BP 109/67 | HR 82 | Ht 65.0 in | Wt 157.0 lb

## 2022-01-31 DIAGNOSIS — G20 Parkinson's disease: Secondary | ICD-10-CM | POA: Diagnosis not present

## 2022-01-31 NOTE — Patient Instructions (Signed)
Local and Online Resources for Power over Parkinson's Group June 2023  LOCAL [place] PARKINSON'S GROUPS  Power over Parkinson's Group:   Power Over Parkinson's Patient Education Group will be Wednesday, June 14th-*Hybrid meting*- in person at [place] [facility] location and via WEBEX at 2:00 pm.   Upcoming Power over Parkinson's Meetings:  2nd Wednesdays of the month at 2 pm:   June 14th, July 12th Contact Amy Marriott at [email or web address] if interested in participating in this group Parkinson's Care Partners Group:    3rd Mondays, Contact Misty Paladino Atypical Parkinsonian Patient Group:   4th Wednesdays, Contact Misty Paladino If you are interested in participating in these groups with Misty, please contact her directly for how to join those meetings.  Her contact information is [email or web address].    LOCAL EVENTS AND NEW OFFERINGS Dance Class for People with Parkinson's at [employer].  Friday, June 9th at 2 pm.  Led by [employer] DPT students.  Contact [email or web address] to register or with questions. Ice Cream Social at [employer]!  Thursday, June 15th, 5:30-7:00 pm.  RSVP to [email or web address] for attendance and free ice cream. Parkinson's T-shirts for sale!  Designed by a local group member, with funds going to Movement Disorders Fund.  $25.00  Contact Misty to purchase  New PWR! Moves Community Fitness Instructor-Led Class offering at [employer]!  Wednesdays 1-2 pm, starting April 12th.   Contact Susan Laney, Fitness Manager at [employer].  [email or web address]  ONLINE EDUCATION AND SUPPORT Parkinson Foundation:  www.parkinson.org PD Health at Home continues:  Mindfulness Mondays, Wellness Wednesdays, Fitness Fridays  Upcoming Education: Parkinson's 101:  What You and Your Family Should Know.  Wednesday, June 7th at 1:00 pm Register for expert briefings (webinars) at  [email or web address]://www.[email or web address].org/resources-support/online-education/expert-briefings-webinars Please check out their website to sign up for emails and see their full online offerings   [employer] Foundation:  www.michaeljfox.org  Third Thursday Webinars:  On the third Thursday of every month at 12 p.m. ET, join our free live webinars to learn about various aspects of living with Parkinson's disease and our work to speed medical breakthroughs. Upcoming Webinar: REPLAY:  From Low Blood Pressure to Bladder Problems:  A Look at Lesser Known Parkinson's Symptoms.  Thursday, June 15th at 12 noon. Check out additional information on their website to see their full online offerings  [employer] Phinney Foundation:  www.davisphinneyfoundation.org Upcoming Webinar:   Stay tuned Webinar Series:  Living with Parkinson's Meetup.   Third Thursdays each month, 3 pm Care Partner Monthly Meetup.  With Connie Carpenter Phinney.  First Tuesday of each month, 2 pm Check out additional information to Live Well Today on their website  Parkinson and Movement Disorders (PMD) Alliance:  www.pmdalliance.org [employer] Online:  Online Education Events Sign up for emails, which are sent weekly to give you updates on programming and online offerings  Parkinson's Association [place]:  www.parkinsonassociation.org Information on online support groups, education events, and online exercises including Yoga, Parkinson's exercises and more-LOTS of information on links to PD resources and online events Virtual Support Group through Parkinson's Association of [place]; next one is scheduled for Wednesday, June 7th at 2 pm. (These are typically scheduled for the 1st Wednesday of the month at 2 pm).  Visit website for details. Save the date for "Caring for Parkinson's-Caring for You", 9th Annual Symposium.  In-person event in [place].  September 9th.  More info on registration to come. MOVEMENT AND EXERCISE OPPORTUNITIES PWR!  Moves Classes at [facility].  Wednesdays 10 and 11   am.   Contact Amy Marriott, PT [email or web address] if interested. NEW PWR! Moves Class offering at [employer].  Wednesdays 1-2 pm, starting April 12th.  Contact Susan Laney, Fitness Manager at [employer].  [email or web address] Here is a link to the PWR!Moves classes on Zoom from [place] Parkinson's Foundation - Daily Mon-Sat at 10:00. Via Zoom, FREE and open to all.  There is also a link below via Facebook if you use that platform.  https://www.[email or web address].org/mpf-programs/exercise-and-movement-activities https://www.facebook.com/ParkinsonsMI.org/posts/pwr-moves-exercise-class-parkinson-wellness-recovery-online-with-angee-ludwa-pt-/10156827878021813/  Parkinson's Wellness Recovery (PWR! Moves)  www.pwr4life.org Info on the PWR! Virtual Experience:  You will have access to our expertise through self-assessment, guided plans that start with the PD-specific fundamentals, educational content, tips, Q&A with an expert, and a growing library of PD-specific pre-recorded and live exercise classes of varying types and intensity - both physical and cognitive! If that is not enough, we offer 1:1 wellness consultations (in-person or virtual) to personalize your PWR! Virtual Experience.  Parkinson Foundation Fitness Fridays:  As part of the PD Health @ Home program, this free video series focuses each week on one aspect of fitness designed to support people living with Parkinson's.  These weekly videos highlight the Parkinson Foundation recent fitness guidelines for people with Parkinson's disease. www.parkinson.org/resources-support/online-education/pdhealth#ff Dance for PD website is offering free, live-stream classes throughout the week, as well as links to digital library of classes:  https://danceforparkinsons.org/ Virtual dance and Pilates for Parkinson's classes: Click on the Community Tab> Parkinson's Movement Initiative  Tab.  To register for classes and for more information, visit www.americandancefestival.org and click the "community" tab.  [employer] Parkinson's Cycling Classes  [employer]:  Thursdays @ Noon-Live classes at [employer] (Contact Margaret Hazen at [email or web address] or [phone]) [employer]: Virtual Classes Mondays and Thursdays /Live classes Tuesday, Wednesday and Thursday (contact Marlee at [email or web address]  or [phone]) [place] Steady Boxing Varied levels of classes are offered Tuesdays and Thursdays at [employer].  Stretching with Maria weekly class is also offered for people with Parkinson's To observe a class or for more information, call [phone] or email Hillary Savage at [email or web address] ADDITIONAL SUPPORT AND RESOURCES [employer]:Online Caregiver Education Opportunities:  www.well-springsolutions.org/caregiver-education/caregiver-support-group.  You may also contact Jodi Kolada at [email or web address] or [phone].    [facility] Navigator:  Just1Navigator program, a free service to help individuals and families through the journey of determining care for older adults.  The "Navigator" is a social worker, Nicole Reynolds, who will speak with a prospective client and/or loved ones to provide an assessment of the situation and a set of recommendations for a personalized care plan -- all free of charge, and whether [facility] offers the needed service or not. If the need is not a service we provide, we are well-connected with reputable programs in town that we can refer you to.  www.well-springsolutions.org or to speak with the Navigator, call [phone]. Family Caregiver Programming in June:  [employer], Thursday, June 15th 11-12:30 at [employer], [place].  Call [phone] to register  

## 2022-02-06 ENCOUNTER — Ambulatory Visit (INDEPENDENT_AMBULATORY_CARE_PROVIDER_SITE_OTHER): Payer: Medicare PPO

## 2022-02-06 DIAGNOSIS — T63441D Toxic effect of venom of bees, accidental (unintentional), subsequent encounter: Secondary | ICD-10-CM

## 2022-02-11 ENCOUNTER — Encounter: Payer: Self-pay | Admitting: Nurse Practitioner

## 2022-02-11 ENCOUNTER — Ambulatory Visit (INDEPENDENT_AMBULATORY_CARE_PROVIDER_SITE_OTHER): Payer: Medicare PPO

## 2022-02-11 ENCOUNTER — Ambulatory Visit: Payer: Medicare PPO | Admitting: Nurse Practitioner

## 2022-02-11 VITALS — BP 118/74 | HR 65 | Temp 97.9°F | Ht 65.0 in | Wt 157.8 lb

## 2022-02-11 DIAGNOSIS — R918 Other nonspecific abnormal finding of lung field: Secondary | ICD-10-CM | POA: Insufficient documentation

## 2022-02-11 DIAGNOSIS — I2609 Other pulmonary embolism with acute cor pulmonale: Secondary | ICD-10-CM | POA: Diagnosis not present

## 2022-02-11 DIAGNOSIS — M47814 Spondylosis without myelopathy or radiculopathy, thoracic region: Secondary | ICD-10-CM | POA: Diagnosis not present

## 2022-02-11 NOTE — Progress Notes (Signed)
@Patient  ID: , female    DOB: 1935/12/31, 86 y.o.   MRN: [id number]  Chief Complaint  Patient presents with   Hospitalization Follow-up    She was in the hosp 01/19/22* 01/22/22. For PE. She is still feeling fatigue,     Referring provider: 01/24/22, MD  HPI: 86 year old female, former smoker who is a retired 91 followed for pulmonary embolism.  She is new to the pulmonary clinic and last seen during her hospital stay by Dr. Runner, broadcasting/film/video.  She was admitted from 01/19/2022 to 01/22/2022 for unprovoked acute submassive pulmonary embolism and associated acute hypoxic respiratory failure.  Past medical history significant for Parkinson's disease, lumbar spinal stenosis, osteoarthritis, AAA without rupture, diastolic CHF, migraines, GERD, HLD.  TEST/EVENTS:  01/19/2022 CTA chest: multiple bilateral pulmonary arterial filling defcts. RV to LV ratio is 1:12. Interval increase in diameter of PA. 4.3 ascending thoracic aortic aneurysm, stable up to 10 years. No LAD. Minimal b/l dependent atelectasis. Interval wedge-shaped area of consolidation in the anteromedial aspect of the RLL with air bronchogram. Interval minimal irregular patchy density more superiorly and laterally in the RLL.  01/20/2022 echocardiogram: EF 60-65%; G1DD. RV size and function nl. Severely elevated PASP. Trivial MR. Moderate TR. AR mild. Mild dilatation of the ascending aorta.   01/19/2022-01/22/2022: Hospitalization for acute respiratory failure related to unprovoked acute submassive PE with right heart strain on CTA. She had echocardiogram with normal right sided function and severely elevated PASP. She was treated with heparin drip and transitioned to PO Eliquis. Recommended lifelong as clot was unprovoked. She had RLL infiltrates on her imaging, suspected to be related to PE. She did receiver 1 dose of ceftriaxone and azithromycin; recommended outpatient imaging to ensure resolution.   02/11/2022: Today - follow  up Patient presents today for hospital follow up. She reports feeling better since being home. She still has some fatigue and occasional chest discomfort. She also gets winded more easily with activities than she prior to this. Her cough is hacking but has cleared up and she is no longer producing sputum. All of her symptoms are steadily improving though and she feels like she is getting some energy back. She would like to go back to her exercise class for people with Parkinson's and wants to know if this would be okay. She denies any bleeding/excessive bruising, hemoptysis, fevers, chills. She is taking her Eliquis twice daily.   Allergies  Allergen Reactions   Ciprofloxacin Other (See Comments)    Can not take due to hx of Cdiff per patient   Wasp Venom Other (See Comments)    dizziness   Fluconazole Rash    Immunization History  Administered Date(s) Administered   Fluad Quad(high Dose 65+) 04/20/2019   Influenza, High Dose Seasonal PF 05/05/2018   Influenza,inj,quad, With Preservative 05/06/2019   PFIZER(Purple Top)SARS-COV-2 Vaccination 09/10/2019, 10/05/2019, 03/05/2020   Unspecified SARS-COV-2 Vaccination 09/10/2019, 10/05/2019    Past Medical History:  Diagnosis Date   Anginal pain (HCC)    Angio-edema    Arthritis    Atypical chest pain 07/06/2009   Qualifier: Diagnosis of  By: 14/09/2008, MD, Christopher     GERD (gastroesophageal reflux disease)    Hyperlipemia    Migraines    Toxic effect of venom of bees, unintentional 06/03/2017    Tobacco History: Social History   Tobacco Use  Smoking Status Former   Packs/day: 0.25   Years: 5.00   Total pack years: 1.25   Types: Cigarettes   Quit  date: 08/15/1963   Years since quitting: 58.5  Smokeless Tobacco Never   Counseling given: Not Answered   Outpatient Medications Prior to Visit  Medication Sig Dispense Refill   APIXABAN (ELIQUIS) VTE STARTER PACK (10MG  AND 5MG ) Take as directed on package: start with two-5mg   tablets twice daily for 7 days. On day 8, switch to one-5mg  tablet twice daily. 1 each 0   atorvastatin (LIPITOR) 20 MG tablet Take 1 tablet (20 mg total) by mouth daily after supper. 30 tablet 0   carbidopa-levodopa (SINEMET IR) 25-100 MG tablet TAKE 1 AND 1/2 TABLETS BY MOUTH THREE TIMES DAILY 270 tablet 0   CRANBERRY PO Take 300 mg daily by mouth.     Cyanocobalamin (VITAMIN B-12 PO) Take 1,200 mcg daily by mouth.      EPINEPHrine 0.3 mg/0.3 mL IJ SOAJ injection Inject 0.3 mg into the muscle once as needed for up to 1 dose (anaphylaxis). 2 each 1   Ergocalciferol (VITAMIN D2) 2000 units TABS Take 2,000 Units daily by mouth.      furosemide (LASIX) 20 MG tablet Take 20 mg by mouth 2 (two) times a week. Take 1 tablet  M, F     KLOR-CON M10 10 MEQ tablet 10 mEq 2 (two) times a week.     omeprazole (PRILOSEC) 40 MG capsule Take 40 mg by mouth continuous as needed (indigestion).     polyethylene glycol (MIRALAX / GLYCOLAX) 17 g packet Take 17 g by mouth daily as needed for mild constipation. 14 each 0   propranolol ER (INDERAL LA) 80 MG 24 hr capsule TAKE 1 CAPSULE(80 MG) BY MOUTH DAILY (Patient taking differently: Take 80 mg by mouth daily.) 90 capsule 3   saccharomyces boulardii (FLORASTOR) 250 MG capsule Take 250 mg by mouth 3 (three) times a week.     No facility-administered medications prior to visit.     Review of Systems:   Constitutional: No weight loss or gain, night sweats, fevers, chills +fatigue, lassitude (improving) HEENT: No headaches, difficulty swallowing, tooth/dental problems, or sore throat. No sneezing, itching, ear ache, nasal congestion, or post nasal drip CV:  No chest pain, orthopnea, PND, swelling in lower extremities, anasarca, dizziness, palpitations, syncope Resp: +shortness of breath with exertion; cough (improving). No excess mucus or change in color of mucus. No hemoptysis. No wheezing.  No chest wall deformity GI:  No heartburn, indigestion, abdominal pain,  nausea, vomiting, diarrhea, change in bowel habits, loss of appetite, bloody stools.  GU: No dysuria, change in color of urine, urgency or frequency.  No flank pain, no hematuria  Skin: No rash, lesions, ulcerations Neuro: No dizziness or lightheadedness.  Psych: No depression or anxiety. Mood stable.     Physical Exam:  BP 118/74 (BP Location: Left Arm, Cuff Size: Normal)   Pulse 65   Temp 97.9 F (36.6 C) (Oral)   Ht 5\' 5"  (1.651 m)   Wt 157 lb 12.8 oz (71.6 kg)   SpO2 99%   BMI 26.26 kg/m   GEN: Pleasant, interactive, well-appearing; in no acute distress. HEENT:  Normocephalic and atraumatic. PERRLA. Sclera white. Nasal turbinates pink, moist and patent bilaterally. No rhinorrhea present. Oropharynx pink and moist, without exudate or edema. No lesions, ulcerations, or postnasal drip.  NECK:  Supple w/ fair ROM. No JVD present.  CV: RRR, no m/r/g, no peripheral edema. Pulses intact, +2 bilaterally. No cyanosis, pallor or clubbing. PULMONARY:  Unlabored, regular breathing. Clear bilaterally A&P w/o wheezes/rales/rhonchi. No accessory muscle use. No dullness  to percussion. GI: BS present and normoactive. Soft, non-tender to palpation. No organomegaly or masses detected. No CVA tenderness. MSK: No erythema, warmth or tenderness. Cap refil <2 sec all extrem. No deformities or joint swelling noted.  Neuro: A/Ox3. No focal deficits noted.   Skin: Warm, no lesions or rashe Psych: Normal affect and behavior. Judgement and thought content appropriate.     Lab Results:  CBC    Component Value Date/Time   WBC 4.7 01/21/2022 0334   RBC 3.49 (L) 01/21/2022 0334   HGB 10.7 (L) 01/21/2022 0334   HCT 34.5 (L) 01/21/2022 0334   PLT 125 (L) 01/21/2022 0334   MCV 98.9 01/21/2022 0334   MCH 30.7 01/21/2022 0334   MCHC 31.0 01/21/2022 0334   RDW 12.7 01/21/2022 0334   LYMPHSABS 0.8 01/20/2022 0750   MONOABS 0.3 01/20/2022 0750   EOSABS 0.1 01/20/2022 0750   BASOSABS 0.0 01/20/2022 0750     BMET    Component Value Date/Time   NA 142 01/21/2022 0334   K 4.2 01/21/2022 0334   CL 111 01/21/2022 0334   CO2 23 01/21/2022 0334   GLUCOSE 107 (H) 01/21/2022 0334   BUN 19 01/21/2022 0334   CREATININE 0.74 01/21/2022 0334   CALCIUM 8.3 (L) 01/21/2022 0334   GFRNONAA >60 01/21/2022 0334   GFRAA >60 08/11/2018 1039    BNP    Component Value Date/Time   BNP 142.6 (H) 01/22/2022 0418     Imaging:  DG Chest 2 View  Result Date: 02/11/2022 CLINICAL DATA:  Right lower lobe infiltrate follow-up EXAM: CHEST - 2 VIEW COMPARISON:  Radiographs 01/19/2022, CT 01/21/2022 FINDINGS: Improved opacities in the right medial lower lung, essentially resolved. There is no new airspace disease. There is no pleural effusion. No pneumothorax. Cardiomediastinal silhouette is unchanged. There is no acute osseous abnormality. Thoracic spondylosis. IMPRESSION: Improved opacities in the right medial lower lung, essentially resolved. Electronically Signed   By: Caprice Renshaw M.D.   On: 02/11/2022 11:43   VAS [place] LOWER EXTREMITY VENOUS (DVT)  Result Date: 01/21/2022  Lower Venous DVT Study Patient Name:  Linda Lee  Date of Exam:   01/21/2022 Medical Rec #: [id number]           Accession #:    [id number] Date of Birth: 01-Oct-1935          Patient Gender: F Patient Age:   64 years Exam Location:  [facility] Procedure:      VAS [place] LOWER EXTREMITY VENOUS (DVT) Referring Phys: Lanora Manis MATHEWS --------------------------------------------------------------------------------  Indications: Pulmonary embolism.  Comparison Study: No previous exams Performing Technologist: Jody Hill RVT, RDMS  Examination Guidelines: A complete evaluation includes B-mode imaging, spectral Doppler, color Doppler, and power Doppler as needed of all accessible portions of each vessel. Bilateral testing is considered an integral part of a complete examination. Limited examinations for reoccurring indications may be performed  as noted. The reflux portion of the exam is performed with the patient in reverse Trendelenburg.  +---------+---------------+---------+-----------+----------+--------------+ RIGHT    CompressibilityPhasicitySpontaneityPropertiesThrombus Aging +---------+---------------+---------+-----------+----------+--------------+ CFV      Full           No       Yes                                 +---------+---------------+---------+-----------+----------+--------------+ SFJ      Full                                                        +---------+---------------+---------+-----------+----------+--------------+  FV Prox  Full           No       Yes                                 +---------+---------------+---------+-----------+----------+--------------+ FV Mid   Full           No       Yes                                 +---------+---------------+---------+-----------+----------+--------------+ FV DistalFull           No       Yes                                 +---------+---------------+---------+-----------+----------+--------------+ PFV      Full                                                        +---------+---------------+---------+-----------+----------+--------------+ POP      Full           No       Yes                                 +---------+---------------+---------+-----------+----------+--------------+ PTV      Full                                                        +---------+---------------+---------+-----------+----------+--------------+ PERO     Full                                                        +---------+---------------+---------+-----------+----------+--------------+   +---------+---------------+---------+-----------+----------+--------------+ LEFT     CompressibilityPhasicitySpontaneityPropertiesThrombus Aging +---------+---------------+---------+-----------+----------+--------------+ CFV      Full            No       Yes                                 +---------+---------------+---------+-----------+----------+--------------+ SFJ      Full                                                        +---------+---------------+---------+-----------+----------+--------------+ FV Prox  Full           No       Yes                                 +---------+---------------+---------+-----------+----------+--------------+ FV Mid  Full           No       Yes                                 +---------+---------------+---------+-----------+----------+--------------+ FV DistalFull           No       Yes                                 +---------+---------------+---------+-----------+----------+--------------+ PFV      Full                                                        +---------+---------------+---------+-----------+----------+--------------+ POP      Full           No       Yes                                 +---------+---------------+---------+-----------+----------+--------------+ PTV      Full                                                        +---------+---------------+---------+-----------+----------+--------------+ PERO     Full                                                        +---------+---------------+---------+-----------+----------+--------------+     Summary: BILATERAL: - No evidence of deep vein thrombosis seen in the lower extremities, bilaterally. -Pulsatile doppler waveforms, bilaterally. - RIGHT: - No cystic structure found in the popliteal fossa.  LEFT: - Cystic structures found in the popliteal fossa.  *See table(s) above for measurements and observations. Electronically signed by Sherald Hess MD on 01/21/2022 at 7:52:58 PM.    Final    CT ABDOMEN PELVIS W CONTRAST  Result Date: 01/21/2022 CLINICAL DATA:  Nonlocalized abdomen pain. EXAM: CT ABDOMEN AND PELVIS WITH CONTRAST TECHNIQUE: Multidetector CT imaging of the abdomen and  pelvis was performed using the standard protocol following bolus administration of intravenous contrast. RADIATION DOSE REDUCTION: This exam was performed according to the departmental dose-optimization program which includes automated exposure control, adjustment of the mA and/or kV according to patient size and/or use of iterative reconstruction technique. CONTRAST:  OMNIPAQUE IOHEXOL 300 MG/ML  SOLN COMPARISON:  MR of the abdomen January 15, 2020 FINDINGS: Lower chest: Minimal bilateral pleural effusions are identified. Mild atelectasis/scar of lung bases are noted. The heart size is normal. Hepatobiliary: Multiple simple cysts are identified within the liver, largest in the anterior segment of right lobe liver measuring 6.1 x 5.3 cm. The gallbladder and biliary tree are normal. Pancreas: Unremarkable. No pancreatic ductal dilatation or surrounding inflammatory changes. Spleen: Normal in size without focal abnormality. Adrenals/Urinary Tract: Adrenal glands are unremarkable. Kidneys are normal, without renal calculi, focal lesion, or  hydronephrosis. Bladder is unremarkable. Stomach/Bowel: Appendix appears normal. No evidence of bowel wall thickening, distention, or inflammatory changes. There is diverticulosis of colon. Small hiatal hernia is identified. Stomach is otherwise within normal limits. Vascular/Lymphatic: Aortic atherosclerosis. No enlarged abdominal or pelvic lymph nodes. Reproductive: Uterus and bilateral adnexa are unremarkable. Other: Umbilical herniation of mesenteric fat identified. Musculoskeletal: Degenerative joint changes at L4-5 are noted. IMPRESSION: 1. No acute abnormality identified in the abdomen and pelvis. 2. Multiple simple cysts within the liver. 3. Diverticulosis of colon without evidence of diverticulitis. 4. Minimal bilateral pleural effusions with mild atelectasis/scar of lung bases. Aortic Atherosclerosis (ICD10-I70.0). Electronically Signed   By: Sherian Rein M.D.   On:  01/21/2022 14:51   ECHOCARDIOGRAM COMPLETE  Result Date: 01/20/2022    ECHOCARDIOGRAM REPORT   Patient Name:   Linda Lee Date of Exam: 01/20/2022 Medical Rec #:  [id number]          Height:       65.0 in Accession #:    [id number]         Weight:       155.0 lb Date of Birth:  Aug 06, 1935         BSA:          1.775 m Patient Age:    85 years           BP:           154/69 mmHg Patient Gender: F                  HR:           59 bpm. Exam Location:  Inpatient Procedure: 2D Echo, Cardiac Doppler and Color Doppler Indications:     Pulmonary Embolus I26.09  History:         Patient has prior history of Echocardiogram examinations, most                  recent 09/05/2015. Risk Factors:Dyslipidemia. Ascending thoracic                  aorta aneurysm 4.5 cm. GERD.  Sonographer:     Leta Jungling RDCS Referring Phys:  [id number] Deno Lunger [facility] Diagnosing Phys: Yates Decamp MD IMPRESSIONS  1. Left ventricular ejection fraction, by estimation, is 60 to 65%. The left ventricle has normal function. The left ventricle has no regional wall motion abnormalities. Left ventricular diastolic parameters are consistent with Grade I diastolic dysfunction (impaired relaxation).  2. Right ventricular systolic function is normal. The right ventricular size is normal. There is severely elevated pulmonary artery systolic pressure. The estimated right ventricular systolic pressure is 77.4 mmHg.  3. The mitral valve is normal in structure. Trivial mitral valve regurgitation.  4. Tricuspid valve regurgitation is moderate.  5. The aortic valve is tricuspid. Aortic valve regurgitation is mild.  6. There is mild dilatation of the ascending aorta, measuring 43 mm.  7. The inferior vena cava is dilated in size with <50% respiratory variability, suggesting right atrial pressure of 15 mmHg. FINDINGS  Left Ventricle: Left ventricular ejection fraction, by estimation, is 60 to 65%. The left ventricle has normal function. The left ventricle has  no regional wall motion abnormalities. The left ventricular internal cavity size was normal in size. There is  no left ventricular hypertrophy. Left ventricular diastolic parameters are consistent with Grade I diastolic dysfunction (impaired relaxation). Normal left ventricular filling pressure. Right Ventricle: The right ventricular size is normal. No increase in right  ventricular wall thickness. Right ventricular systolic function is normal. There is severely elevated pulmonary artery systolic pressure. The tricuspid regurgitant velocity is 3.95 m/s, and with an assumed right atrial pressure of 15 mmHg, the estimated right ventricular systolic pressure is 77.4 mmHg. Left Atrium: Left atrial size was normal in size. Right Atrium: Right atrial size was normal in size. Pericardium: There is no evidence of pericardial effusion. Mitral Valve: The mitral valve is normal in structure. Trivial mitral valve regurgitation. Tricuspid Valve: The tricuspid valve is normal in structure. Tricuspid valve regurgitation is moderate. Aortic Valve: The aortic valve is tricuspid. Aortic valve regurgitation is mild. Aortic regurgitation PHT measures 533 msec. Pulmonic Valve: The pulmonic valve was normal in structure. Pulmonic valve regurgitation is trivial. No evidence of pulmonic stenosis. Aorta: The aortic root is normal in size and structure. There is mild dilatation of the ascending aorta, measuring 43 mm. Venous: The inferior vena cava is dilated in size with less than 50% respiratory variability, suggesting right atrial pressure of 15 mmHg. IAS/Shunts: No atrial level shunt detected by color flow Doppler.  LEFT VENTRICLE PLAX 2D LVIDd:         4.90 cm   Diastology LVIDs:         3.60 cm   LV e' medial:    6.31 cm/s LV PW:         0.80 cm   LV E/e' medial:  8.6 LV IVS:        0.80 cm   LV e' lateral:   5.43 cm/s LVOT diam:     2.00 cm   LV E/e' lateral: 10.0 LV SV:         79 LV SV Index:   45 LVOT Area:     3.14 cm  RIGHT  VENTRICLE RV S prime:     14.60 cm/s TAPSE (M-mode): 2.0 cm LEFT ATRIUM             Index        RIGHT ATRIUM           Index LA diam:        2.70 cm 1.52 cm/m   RA Area:     15.70 cm LA Vol (A2C):   30.8 ml 17.35 ml/m  RA Volume:   33.70 ml  18.99 ml/m LA Vol (A4C):   34.9 ml 19.66 ml/m LA Biplane Vol: 35.5 ml 20.00 ml/m  AORTIC VALVE LVOT Vmax:   111.00 cm/s LVOT Vmean:  61.200 cm/s LVOT VTI:    0.253 m AI PHT:      533 msec  AORTA Ao Root diam: 3.40 cm Ao Asc diam:  4.30 cm MITRAL VALVE               TRICUSPID VALVE MV Area (PHT): 2.87 cm    TR Peak grad:   62.4 mmHg MV Decel Time: 264 msec    TR Vmax:        395.00 cm/s MV E velocity: 54.40 cm/s MV A velocity: 78.40 cm/s  SHUNTS MV E/A ratio:  0.69        Systemic VTI:  0.25 m                            Systemic Diam: 2.00 cm Yates Decamp MD Electronically signed by Yates Decamp MD Signature Date/Time: 01/20/2022/9:28:23 PM    Final    CT Angio Chest PE W and/or Wo Contrast  Result Date:  01/19/2022 CLINICAL DATA:  Lightheadedness and nausea for the past 2 days. EXAM: CT ANGIOGRAPHY CHEST WITH CONTRAST TECHNIQUE: Multidetector CT imaging of the chest was performed using the standard protocol during bolus administration of intravenous contrast. Multiplanar CT image reconstructions and MIPs were obtained to evaluate the vascular anatomy. RADIATION DOSE REDUCTION: This exam was performed according to the departmental dose-optimization program which includes automated exposure control, adjustment of the mA and/or kV according to patient size and/or use of iterative reconstruction technique. CONTRAST:  75mL OMNIPAQUE IOHEXOL 350 MG/ML SOLN COMPARISON:  Chest radiographs obtained earlier today. Chest CTA dated 09/25/2021. FINDINGS: Cardiovascular: Multiple bilateral pulmonary arterial filling defects, including the distal right main pulmonary artery. The right ventricular to left ventricular ratio is 1.12. Interval increase in transverse diameter of the main  pulmonary artery, currently measuring 3.5 cm, previously 3.2 cm. 4.3 cm ascending thoracic aortic aneurysm, previously shown to be stable for 10 years. Therefore, this does not need further follow-up unless clinically indicated. Atheromatous calcifications, including the coronary arteries and aorta. Mediastinum/Nodes: No enlarged mediastinal, hilar, or axillary lymph nodes. Thyroid gland, trachea, and esophagus demonstrate no significant findings. Small hiatal hernia. Lungs/Pleura: Minimal bilateral dependent atelectasis. Interval wedge-shaped area of consolidation in the anteromedial aspect of the right lower lobe with an air bronchogram. Interval minimal irregular patchy density more superiorly and laterally in the right lower lobe. Upper Abdomen: Stable liver cysts. Musculoskeletal: Mild thoracic and upper lumbar spine degenerative changes. Review of the MIP images confirms the above findings. IMPRESSION: 1. Positive for acute PE with CT evidence of right heart strain (RV/LV Ratio = 1.12) consistent with at least submassive (intermediate risk) PE. The presence of right heart strain has been associated with an increased risk of morbidity and mortality. Please refer to the "Code PE Focused" order set in EPIC. 2. Interval wedge-shaped and patchy densities in the right lower lobe, suspicious for pulmonary infarction. 3. Interval increased enlargement of the central pulmonary arteries, compatible with pulmonary arterial hypertension associated with the pulmonary emboli. 4.  Calcific coronary artery and aortic atherosclerosis. 5. Stable 4.3 cm ascending thoracic aortic aneurysm, not needing further follow-up unless otherwise clinically indicated. Critical Value/emergent results were called by telephone at the time of interpretation on 01/19/2022 at 4:19 pm to provider Carlyle Dolly, MD, who verbally acknowledged these results. Aortic Atherosclerosis (ICD10-I70.0). Electronically Signed   By: Beckie Salts M.D.   On:  01/19/2022 16:25   DG Chest 2 View  Result Date: 01/19/2022 CLINICAL DATA:  86 year old female with history of shortness of breath. EXAM: CHEST - 2 VIEW COMPARISON:  Chest x-ray 08/05/2017. FINDINGS: Ill-defined opacity in the medial segment of the right middle lobe concerning for probable bronchopneumonia. Lung volumes are normal. No pleural effusions. No pneumothorax. No pulmonary nodule or mass noted. Pulmonary vasculature and the cardiomediastinal silhouette are within normal limits. Atherosclerotic calcifications in the thoracic aorta. IMPRESSION: 1. Findings are concerning for bronchopneumonia in the medial segment of the right middle lobe. Followup PA and lateral chest X-ray is recommended in 3-4 weeks following trial of antibiotic therapy to ensure resolution and exclude underlying malignancy. 2. Aortic atherosclerosis. Electronically Signed   By: Trudie Reed M.D.   On: 01/19/2022 13:31          No data to display          No results found for: "NITRICOXIDE"      Assessment & Plan:   Acute pulmonary embolism (HCC) Unprovoked acute submassive PE. Clinically improving. Recommendations for lifelong anticoagulation, but  at minimum 3 months of therapy. She is tolerating it well. Reviewed risks again; verbalized understanding. She would like to return to her exercise classes; discussed monitoring her symptoms and adjusting intensity accordingly. Evidence of right heart strain on initial imaging. She had normal right heart function on echocardiogram with severely elevated PASP from PE. We will repeat echo in 3 months.   Patient Instructions  Continue Eliquis 5 mg Twice daily. Continue with bleeding precautions and if you fall and hit your head, please seek emergency care  Chest x ray today.   Echocardiogram in September 2023 - someone will contact you for scheduling  Follow up mid to end of September with Katie Anastasya Jewell,NP. If symptoms do not improve or worsen, please contact  office for sooner follow up or seek emergency care.     Lung infiltrate Infiltrates in RLL on imaging, likely related to PE. She received one dose of ceftriaxone and azithromycin. No infectious symptoms today. We will repeat CXR to ensure radiographic improvement/resolution.    I spent 28 minutes of dedicated to the care of this patient on the date of this encounter to include pre-visit review of records, face-to-face time with the patient discussing conditions above, post visit ordering of testing, clinical documentation with the electronic health record, making appropriate referrals as documented, and communicating necessary findings to members of the patients care team.  Noemi Chapel, NP 02/11/2022  Pt aware and understands NP's role.

## 2022-02-11 NOTE — Assessment & Plan Note (Signed)
Infiltrates in RLL on imaging, likely related to PE. She received one dose of ceftriaxone and azithromycin. No infectious symptoms today. We will repeat CXR to ensure radiographic improvement/resolution.

## 2022-02-11 NOTE — Patient Instructions (Addendum)
Continue Eliquis 5 mg Twice daily. Continue with bleeding precautions and if you fall and hit your head, please seek emergency care  Chest x ray today.   Echocardiogram in September 2023 - someone will contact you for scheduling  Follow up mid to end of September with Dr. Marchelle Gearing or Philis Nettle. If symptoms do not improve or worsen, please contact office for sooner follow up or seek emergency care.

## 2022-02-11 NOTE — Assessment & Plan Note (Addendum)
Unprovoked acute submassive PE. Clinically improving. Recommendations for lifelong anticoagulation, but at minimum 3 months of therapy. She is tolerating it well. Reviewed risks again; verbalized understanding. She would like to return to her exercise classes; discussed monitoring her symptoms and adjusting intensity accordingly. Evidence of right heart strain on initial imaging. She had normal right heart function on echocardiogram with severely elevated PASP from PE. We will repeat echo in 3 months.   Patient Instructions  Continue Eliquis 5 mg Twice daily. Continue with bleeding precautions and if you fall and hit your head, please seek emergency care  Chest x ray today.   Echocardiogram in September 2023 - someone will contact you for scheduling  Follow up mid to end of September with Katie Niyah Mamaril,NP. If symptoms do not improve or worsen, please contact office for sooner follow up or seek emergency care.

## 2022-02-11 NOTE — Progress Notes (Signed)
Please notify patient that CXR shows almost entirely resolved pneumonia. There is some residual but it is essentially almost gone. We will repeat at her follow up to ensure complete resolution but this is good news! Thanks!

## 2022-02-18 ENCOUNTER — Ambulatory Visit: Payer: Medicare PPO | Attending: Neurology | Admitting: Physical Therapy

## 2022-02-18 ENCOUNTER — Encounter: Payer: Self-pay | Admitting: Physical Therapy

## 2022-02-18 DIAGNOSIS — R278 Other lack of coordination: Secondary | ICD-10-CM | POA: Insufficient documentation

## 2022-02-18 DIAGNOSIS — M6281 Muscle weakness (generalized): Secondary | ICD-10-CM | POA: Insufficient documentation

## 2022-02-18 DIAGNOSIS — R262 Difficulty in walking, not elsewhere classified: Secondary | ICD-10-CM | POA: Diagnosis not present

## 2022-02-18 DIAGNOSIS — R2681 Unsteadiness on feet: Secondary | ICD-10-CM | POA: Insufficient documentation

## 2022-02-18 DIAGNOSIS — G20 Parkinson's disease: Secondary | ICD-10-CM | POA: Diagnosis not present

## 2022-02-18 NOTE — Therapy (Signed)
OUTPATIENT PHYSICAL THERAPY NEURO EVALUATION   Patient Name: Linda Lee MRN: [id number] DOB:19-Jul-1936, 86 y.o., female Today's Date: 02/18/2022   PCP: Rodrigo Ran REFERRING PROVIDER: Vladimir Faster, DO    PT End of Session - 02/18/22 1846     Visit Number 1    PT Start Time 1457    PT Stop Time 1540    PT Time Calculation (min) 43 min    Activity Tolerance Patient tolerated treatment well    Behavior During Therapy WFL for tasks assessed/performed             Past Medical History:  Diagnosis Date   Anginal pain (HCC)    Angio-edema    Arthritis    Atypical chest pain 07/06/2009   Qualifier: Diagnosis of  By: Clifton James, MD, Christopher     GERD (gastroesophageal reflux disease)    Hyperlipemia    Migraines    Toxic effect of venom of bees, unintentional 06/03/2017   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     denies surgery for this   TONSILLECTOMY     TOTAL KNEE ARTHROPLASTY Right 09/25/2015   Procedure: TOTAL KNEE ARTHROPLASTY;  Surgeon: Ollen Gross, MD;  Location: [facility];  Service: Orthopedics;  Laterality: Right;   Patient Active Problem List   Diagnosis Date Noted   Lung infiltrate 02/11/2022   Acute pulmonary embolism (HCC) 01/19/2022   Elevated troponin level not due myocardial infarction 01/19/2022   Asymptomatic bacteriuria 01/19/2022   Nonsustained ventricular tachycardia (HCC) 10/06/2018   Migraine 10/06/2018   History of TIA (transient ischemic attack) 10/05/2018   Coronary artery calcification seen on CT scan 10/05/2018   Parkinson disease (HCC) 12/10/2017   Carpal tunnel syndrome of left wrist 12/08/2017    Ulnar neuropathy 12/08/2017   Dyspnea on exertion 06/17/2017   OA (osteoarthritis) of knee 09/25/2015   UTI (lower urinary tract infection) 09/04/2012   Diastolic CHF, chronic (HCC) 09/04/2012   Subjective visual disturbance, right eye 09/03/2012   Vision disturbance 09/03/2012   Ascending aortic aneurysm (HCC) 07/06/2009   Mixed hyperlipidemia 06/30/2009   GERD without esophagitis 06/30/2009   LEG CRAMPS 06/30/2009    ONSET DATE: 01/31/22  REFERRING DIAG: G20 (ICD-10-CM) - Parkinson's disease (HCC)   THERAPY DIAG:  Other lack of coordination  Muscle weakness (generalized)  Unsteadiness on feet  Difficulty in walking, not elsewhere classified  Rationale for Evaluation and Treatment Rehabilitation  SUBJECTIVE:  SUBJECTIVE STATEMENT: Patient reports that she has Parkinson's disease. It has been well controlled. She tends to scuff her feet no matter what  shoes she is wearing. She is fearful of falling. She also recently was hospitalized due a PE. She feels she is recovering from that. She would like to be more confident. She uses a cane at times and has a dog. Pt accompanied by: self  PERTINENT HISTORY: 1.  Parkinsons Disease             -Continue carbidopa/levodopa 25/100, 1.5 tablets 3 times per day             -PT at [facility] - referral sent             -We discussed the new skin biopsies for alpha-synuclein.  Discussed that they have about 95-96% sensitivity and specificity.  Discussed utility of the biopsy.     2.  Severe spinal canal stenosis at L3-L4             -Follows with Dr. Venetia Maxon in the past and seems to be doing ok in this regard.  Discussed with her that Dr. Venetia Maxon has retired and now Dr. Jake Samples has taken over the care   3.  History of expressive aphasia, likely representing  complicated migraine              -On Lipitor.  Aspirin was discontinued when she was placed on apixaban for a pulmonary embolus.  Extensive negative work-up.   4.  Sialorrhea             -This is commonly associated with PD.  We talked about treatments.  The patient is not a candidate for oral anticholinergic therapy because of increased risk of confusion and falls.  We discussed Botox (type A and B) and 1% atropine drops.  We discusssed that candy like lemon drops can help by stimulating mm of the oropharynx to induce swallowing.  She is not interested right now   5.    Pulmonary embolus             -This just occurred June, 2023.  Patient now on Eliquis  PAIN:  Are you having pain? Yes: NPRS scale: Unable to state./10 Pain location: knees/quads/hamstrings, and back Pain description: ache Aggravating factors: Standing  Relieving factors: gel and cortisone shots helped temporarily  PRECAUTIONS: None  WEIGHT BEARING RESTRICTIONS No  FALLS: Has patient fallen in last 6 months? No  LIVING ENVIRONMENT: Lives with: lives alone Lives in: House/apartment Stairs: No-has a ramped entrance and can stay on first floor. Has following equipment at home: Single point cane, Grab bars, Ramped entry, and accessible bathrooms.  PLOF: Independent  PATIENT GOALS Feel more stable.  OBJECTIVE:   COGNITION: Overall cognitive status: Within functional limits for tasks assessed   SENSATION: Patient with mildly diminished LT on lateral L lower leg  COORDINATION: Grossly WFL BU and LE.  EDEMA:  Patient with mild swelling in BLE, she reports that the swelling has increased recently.  MUSCLE TONE: WNL   MUSCLE LENGTH: Hamstrings: Right 65 deg; Left 45 deg Hip Flexors to neutral.   POSTURE: No Significant postural limitations  LOWER EXTREMITY ROM:   BLE AROM grossly WFL, but L hip is limited at end ranges due to pain  LOWER EXTREMITY MMT:    MMT Right Eval Left Eval  Hip flexion 4-  3+  Hip extension    Hip abduction    Hip adduction    Hip  internal rotation 4- 4-  Hip external rotation    Knee flexion 4- 4-  Knee extension 4- 3+  Ankle dorsiflexion    Ankle plantarflexion 4- 4-  Ankle inversion    Ankle eversion    (Blank rows = not tested)  BED MOBILITY: MI- has bed rails, reports increased difficulty rolling.   TRANSFERS: I, MI, but reports increased difficulty rising from softer and low surfaces.  RAMP:  MI  CURB: MI  STAIRS:  Patient has second floor. She can manage them MI, but avoids them for safety.  GAIT: Gait pattern: step through pattern, decreased step length- Right, decreased step length- Left, decreased hip/knee flexion- Right, decreased hip/knee flexion- Left, decreased ankle dorsiflexion- Right, decreased ankle dorsiflexion- Left, and shuffling Distance walked: 73' Assistive device utilized: None or cane Level of assistance: Modified independence Comments: patient reports feeling more unsteady  FUNCTIONAL TESTs:  5 times sit to stand: 14.89 Timed up and go (TUG): 13.47   **Slightly taller seat height   TODAY'S TREATMENT:  02/18/22 Patient education  PATIENT EDUCATION: Education details: POC, gentle LE ROM Person educated: Patient Education method: Explanation Education comprehension: verbalized understanding   HOME EXERCISE PROGRAM: TBD    GOALS: Goals reviewed with patient? Yes  SHORT TERM GOALS: Target date: 03/18/2022  I with basic HEP Baseline: Goal status: INITIAL   LONG TERM GOALS: Target date: 04/29/2022  I with final HEP Baseline:  Goal status: INITIAL  2.  Decreased TUG to < 10 sec to demonstrate improved balance. Baseline: 13.47 Goal status: INITIAL  3.  Decrease 5x STS to < 12 sec to demonstrate improved BLE strength Baseline: 14.89 Goal status: INITIAL  4.  Patient will return to her Parkinson's cycling class at the [facility] with increased confidence and no pain in R hamstring. Baseline:  Goal  status: INITIAL  5.  Paitent will ambulate at least 500' on level/unlevel surfaces with LRAD, MI. Baseline: 80', level, MI Goal status: INITIAL  ASSESSMENT:  CLINICAL IMPRESSION: Patient is a 86 y.o. who was seen today for physical therapy evaluation and treatment for Parkinson's Disease. She demonstrates weakness, decreased balance, decreased safety and I with all mobility, with increased fall risk due to her deficits. She will benefit from PT for functional strengthening, coordination, balance, and functional mobility re-education to increase safety and decrease fall risk.    OBJECTIVE IMPAIRMENTS Abnormal gait, decreased balance, decreased coordination, decreased endurance, decreased mobility, difficulty walking, decreased ROM, decreased strength, impaired flexibility, impaired sensation, improper body mechanics, and postural dysfunction.   ACTIVITY LIMITATIONS carrying, lifting, bending, standing, squatting, sleeping, stairs, and locomotion level  PARTICIPATION LIMITATIONS: cleaning, laundry, driving, shopping, and community activity  PERSONAL FACTORS Age are also affecting patient's functional outcome.   REHAB POTENTIAL: Good  CLINICAL DECISION MAKING: Evolving/moderate complexity  EVALUATION COMPLEXITY: Moderate  PLAN: PT FREQUENCY: 2x/week  PT DURATION: 10 weeks  PLANNED INTERVENTIONS: Therapeutic exercises, Therapeutic activity, Neuromuscular re-education, Balance training, Gait training, Patient/Family education, Self Care, Joint mobilization, Stair training, and Manual therapy  PLAN FOR NEXT SESSION: HEP   Iona Beard, DPT 02/18/2022, 6:53 PM

## 2022-02-21 ENCOUNTER — Other Ambulatory Visit: Payer: Self-pay

## 2022-02-21 DIAGNOSIS — M810 Age-related osteoporosis without current pathological fracture: Secondary | ICD-10-CM | POA: Insufficient documentation

## 2022-02-28 ENCOUNTER — Encounter: Payer: Self-pay | Admitting: Physical Therapy

## 2022-02-28 ENCOUNTER — Ambulatory Visit: Payer: Medicare PPO | Admitting: Physical Therapy

## 2022-02-28 DIAGNOSIS — R278 Other lack of coordination: Secondary | ICD-10-CM | POA: Diagnosis not present

## 2022-02-28 DIAGNOSIS — R2681 Unsteadiness on feet: Secondary | ICD-10-CM | POA: Diagnosis not present

## 2022-02-28 DIAGNOSIS — R262 Difficulty in walking, not elsewhere classified: Secondary | ICD-10-CM | POA: Diagnosis not present

## 2022-02-28 DIAGNOSIS — M6281 Muscle weakness (generalized): Secondary | ICD-10-CM | POA: Diagnosis not present

## 2022-02-28 DIAGNOSIS — G20 Parkinson's disease: Secondary | ICD-10-CM | POA: Diagnosis not present

## 2022-02-28 NOTE — Therapy (Signed)
OUTPATIENT PHYSICAL THERAPY NEURO EVALUATION   Patient Name: Linda Lee MRN: [id number] DOB:1936/06/13, 86 y.o., female Today's Date: 02/28/2022   PCP: Rodrigo Ran REFERRING PROVIDER: Vladimir Faster, DO    PT End of Session - 02/28/22 0941     Visit Number 2    PT Start Time 0930    PT Stop Time 1015    PT Time Calculation (min) 45 min    Activity Tolerance Patient tolerated treatment well    Behavior During Therapy [facility] for tasks assessed/performed             Past Medical History:  Diagnosis Date   Anginal pain (HCC)    Angio-edema    Arthritis    Atypical chest pain 07/06/2009   Qualifier: Diagnosis of  By: Clifton James, MD, Christopher     GERD (gastroesophageal reflux disease)    Hyperlipemia    Migraines    Toxic effect of venom of bees, unintentional 06/03/2017   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     denies surgery for this   TONSILLECTOMY     TOTAL KNEE ARTHROPLASTY Right 09/25/2015   Procedure: TOTAL KNEE ARTHROPLASTY;  Surgeon: Ollen Gross, MD;  Location: [facility];  Service: Orthopedics;  Laterality: Right;   Patient Active Problem List   Diagnosis Date Noted   Osteoporosis 02/21/2022   Lung infiltrate 02/11/2022   Acute pulmonary embolism (HCC) 01/19/2022   Elevated troponin level not due myocardial infarction 01/19/2022   Asymptomatic bacteriuria 01/19/2022   Nonsustained ventricular tachycardia (HCC) 10/06/2018   Migraine 10/06/2018   History of TIA (transient ischemic attack) 10/05/2018   Coronary artery calcification seen on CT scan 10/05/2018   Parkinson disease (HCC) 12/10/2017   Carpal tunnel syndrome of  left wrist 12/08/2017   Ulnar neuropathy 12/08/2017   Dyspnea on exertion 06/17/2017   OA (osteoarthritis) of knee 09/25/2015   UTI (lower urinary tract infection) 09/04/2012   Diastolic CHF, chronic (HCC) 09/04/2012   Subjective visual disturbance, right eye 09/03/2012   Vision disturbance 09/03/2012   Ascending aortic aneurysm (HCC) 07/06/2009   Mixed hyperlipidemia 06/30/2009   GERD without esophagitis 06/30/2009   LEG CRAMPS 06/30/2009    ONSET DATE: 01/31/22  REFERRING DIAG: G20 (ICD-10-CM) - Parkinson's disease (HCC)   THERAPY DIAG:  Other lack of coordination  Muscle weakness (generalized)  Unsteadiness on feet  Difficulty in walking, not elsewhere classified  Rationale for Evaluation and Treatment Rehabilitation  SUBJECTIVE:  SUBJECTIVE STATEMENT: No issues just slow and unsteady at times, working with my dog to do better with walking  PERTINENT HISTORY: 1.  Parkinsons Disease             -Continue carbidopa/levodopa 25/100, 1.5 tablets 3 times per day             -PT at [facility] - referral sent             -We discussed the new skin biopsies for alpha-synuclein.  Discussed that they have about 95-96% sensitivity and specificity.  Discussed utility of the biopsy.     2.  Severe spinal canal stenosis at L3-L4             -Follows with Dr. Venetia Maxon in the past and seems to be doing ok in this regard.  Discussed with her that Dr. Venetia Maxon has retired and now Dr. Jake Samples has taken over the care   3.  History of expressive aphasia, likely representing complicated migraine              -On Lipitor.  Aspirin was discontinued when she was placed on apixaban for a pulmonary embolus.  Extensive negative work-up.   4.  Sialorrhea             -This is commonly associated with PD.  We talked  about treatments.  The patient is not a candidate for oral anticholinergic therapy because of increased risk of confusion and falls.  We discussed Botox (type A and B) and 1% atropine drops.  We discusssed that candy like lemon drops can help by stimulating mm of the oropharynx to induce swallowing.  She is not interested right now   5.    Pulmonary embolus             -This just occurred June, 2023.  Patient now on Eliquis  PAIN:  Are you having pain? Yes: NPRS scale: Unable to state./10 Pain location: knees/quads/hamstrings, and back Pain description: ache Aggravating factors: Standing  Relieving factors: gel and cortisone shots helped temporarily  PRECAUTIONS: None  WEIGHT BEARING RESTRICTIONS No  FALLS: Has patient fallen in last 6 months? No  LIVING ENVIRONMENT: Lives with: lives alone Lives in: House/apartment Stairs: No-has a ramped entrance and can stay on first floor. Has following equipment at home: Single point cane, Grab bars, Ramped entry, and accessible bathrooms.  PLOF: Independent  PATIENT GOALS Feel more stable.  OBJECTIVE:   COGNITION: Overall cognitive status: Within functional limits for tasks assessed   SENSATION: Patient with mildly diminished LT on lateral L lower leg  COORDINATION: Grossly WFL BU and LE.  EDEMA:  Patient with mild swelling in BLE, she reports that the swelling has increased recently.  MUSCLE TONE: WNL   MUSCLE LENGTH: Hamstrings: Right 65 deg; Left 45 deg Hip Flexors to neutral.   POSTURE: No Significant postural limitations  LOWER EXTREMITY ROM:   BLE AROM grossly WFL, but L hip is limited at end ranges due to pain  LOWER EXTREMITY MMT:    MMT Right Eval Left Eval  Hip flexion 4- 3+  Hip extension    Hip abduction    Hip adduction    Hip internal rotation 4- 4-  Hip external rotation    Knee flexion 4- 4-  Knee extension 4- 3+  Ankle dorsiflexion    Ankle plantarflexion 4- 4-  Ankle inversion    Ankle  eversion    (Blank rows = not tested)  BED MOBILITY: MI-  has bed rails, reports increased difficulty rolling.   TRANSFERS: I, MI, but reports increased difficulty rising from softer and low surfaces.  RAMP:  MI  CURB: MI  STAIRS:  Patient has second floor. She can manage them MI, but avoids them for safety.  GAIT: Gait pattern: step through pattern, decreased step length- Right, decreased step length- Left, decreased hip/knee flexion- Right, decreased hip/knee flexion- Left, decreased ankle dorsiflexion- Right, decreased ankle dorsiflexion- Left, and shuffling Distance walked: 65' Assistive device utilized: None or cane Level of assistance: Modified independence Comments: patient reports feeling more unsteady  FUNCTIONAL TESTs:  5 times sit to stand: 14.89 Timed up and go (TUG): 13.47   **Slightly taller seat height   TODAY'S TREATMENT:  02/28/22 HS curls green tband\ 3# LAQ 3# marches in sitting Adjusted her cane as it was too tall Nustep Level 5 x 6 minutes Side stepping over small object Cone toe touch solid surface, then on airex with SPC Side stepping on and off airex On airex ball toss Walking direction changes Walking ball toss On airex balance beam side stepping and tandem walk  Stairs trying step over step on the 4" and 6" Bike x 5 minutes level 3   02/18/22 Patient education  PATIENT EDUCATION: Education details: POC, gentle LE ROM Person educated: Patient Education method: Explanation Education comprehension: verbalized understanding   HOME EXERCISE PROGRAM: TBD    GOALS: Goals reviewed with patient? Yes  SHORT TERM GOALS: Target date: 03/18/2022  I with basic HEP Baseline: Goal status: INITIAL   LONG TERM GOALS: Target date: 04/29/2022  I with final HEP Baseline:  Goal status: INITIAL  2.  Decreased TUG to < 10 sec to demonstrate improved balance. Baseline: 13.47 Goal status: INITIAL  3.  Decrease 5x STS to < 12 sec to  demonstrate improved BLE strength Baseline: 14.89 Goal status: INITIAL  4.  Patient will return to her Parkinson's cycling class at the [facility] with increased confidence and no pain in R hamstring. Baseline:  Goal status: INITIAL  5.  Paitent will ambulate at least 500' on level/unlevel surfaces with LRAD, MI. Baseline: 80', level, MI Goal status: INITIAL  ASSESSMENT:  CLINICAL IMPRESSION: Patient really did well, some issues on the dynamic surfaces but able to do with light CGA.  Left LE is weaker and needs work but overall can really work on higher level balance  OBJECTIVE IMPAIRMENTS Abnormal gait, decreased balance, decreased coordination, decreased endurance, decreased mobility, difficulty walking, decreased ROM, decreased strength, impaired flexibility, impaired sensation, improper body mechanics, and postural dysfunction.   ACTIVITY LIMITATIONS carrying, lifting, bending, standing, squatting, sleeping, stairs, and locomotion level  PARTICIPATION LIMITATIONS: cleaning, laundry, driving, shopping, and community activity  PERSONAL FACTORS Age are also affecting patient's functional outcome.   REHAB POTENTIAL: Good  CLINICAL DECISION MAKING: Evolving/moderate complexity  EVALUATION COMPLEXITY: Moderate  PLAN: PT FREQUENCY: 2x/week  PT DURATION: 10 weeks  PLANNED INTERVENTIONS: Therapeutic exercises, Therapeutic activity, Neuromuscular re-education, Balance training, Gait training, Patient/Family education, Self Care, Joint mobilization, Stair training, and Manual therapy  PLAN FOR NEXT SESSION: HEP and higher level balance activities   Stacie Glaze, PT 02/28/2022, 9:42 AM

## 2022-03-01 ENCOUNTER — Ambulatory Visit: Payer: Medicare PPO | Admitting: Neurology

## 2022-03-04 ENCOUNTER — Encounter: Payer: Self-pay | Admitting: Physical Therapy

## 2022-03-04 ENCOUNTER — Ambulatory Visit: Payer: Medicare PPO | Admitting: Physical Therapy

## 2022-03-04 DIAGNOSIS — R2681 Unsteadiness on feet: Secondary | ICD-10-CM | POA: Diagnosis not present

## 2022-03-04 DIAGNOSIS — R278 Other lack of coordination: Secondary | ICD-10-CM

## 2022-03-04 DIAGNOSIS — R262 Difficulty in walking, not elsewhere classified: Secondary | ICD-10-CM

## 2022-03-04 DIAGNOSIS — G20 Parkinson's disease: Secondary | ICD-10-CM | POA: Diagnosis not present

## 2022-03-04 DIAGNOSIS — M6281 Muscle weakness (generalized): Secondary | ICD-10-CM

## 2022-03-04 NOTE — Therapy (Signed)
OUTPATIENT PHYSICAL THERAPY NEURO EVALUATION   Patient Name: Linda Lee MRN: [id number] DOB:[birth year], 86 y.o., female Today's Date: 03/04/2022   PCP: Rodrigo Ran REFERRING PROVIDER: Vladimir Faster, DO    PT End of Session - 03/04/22 1550     Visit Number 3    PT Start Time 1546    PT Stop Time 1625    PT Time Calculation (min) 39 min    Activity Tolerance Patient tolerated treatment well    Behavior During Therapy WFL for tasks assessed/performed              Past Medical History:  Diagnosis Date   Anginal pain (HCC)    Angio-edema    Arthritis    Atypical chest pain 07/06/2009   Qualifier: Diagnosis of  By: Clifton James, MD, Christopher     GERD (gastroesophageal reflux disease)    Hyperlipemia    Migraines    Toxic effect of venom of bees, unintentional 06/03/2017   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     denies surgery for this   TONSILLECTOMY     TOTAL KNEE ARTHROPLASTY Right 09/25/2015   Procedure: TOTAL KNEE ARTHROPLASTY;  Surgeon: Ollen Gross, MD;  Location: [facility];  Service: Orthopedics;  Laterality: Right;   Patient Active Problem List   Diagnosis Date Noted   Osteoporosis 02/21/2022   Lung infiltrate 02/11/2022   Acute pulmonary embolism (HCC) 01/19/2022   Elevated troponin level not due myocardial infarction 01/19/2022   Asymptomatic bacteriuria 01/19/2022   Nonsustained ventricular tachycardia (HCC) 10/06/2018   Migraine 10/06/2018   History of TIA (transient ischemic attack) 10/05/2018   Coronary artery calcification seen on CT scan 10/05/2018   Parkinson disease (HCC) 12/10/2017   Carpal tunnel syndrome  of left wrist 12/08/2017   Ulnar neuropathy 12/08/2017   Dyspnea on exertion 06/17/2017   OA (osteoarthritis) of knee 09/25/2015   UTI (lower urinary tract infection) 09/04/2012   Diastolic CHF, chronic (HCC) 09/04/2012   Subjective visual disturbance, right eye 09/03/2012   Vision disturbance 09/03/2012   Ascending aortic aneurysm (HCC) 07/06/2009   Mixed hyperlipidemia 06/30/2009   GERD without esophagitis 06/30/2009   LEG CRAMPS 06/30/2009    ONSET DATE: 01/31/22  REFERRING DIAG: G20 (ICD-10-CM) - Parkinson's disease (HCC)   THERAPY DIAG:  Other lack of coordination  Muscle weakness (generalized)  Unsteadiness on feet  Difficulty in walking, not elsewhere classified  Rationale for Evaluation and Treatment Rehabilitation  SUBJECTIVE:  SUBJECTIVE STATEMENT: Patient reports she has some difficulty climbing into her Zenaida Niece. She feels about the same.  PERTINENT HISTORY: 1.  Parkinsons Disease             -Continue carbidopa/levodopa 25/100, 1.5 tablets 3 times per day             -PT at [facility] - referral sent             -We discussed the new skin biopsies for alpha-synuclein.  Discussed that they have about 95-96% sensitivity and specificity.  Discussed utility of the biopsy.     2.  Severe spinal canal stenosis at L3-L4             -Follows with Dr. Venetia Maxon in the past and seems to be doing ok in this regard.  Discussed with her that Dr. Venetia Maxon has retired and now Dr. Jake Samples has taken over the care   3.  History of expressive aphasia, likely representing complicated migraine              -On Lipitor.  Aspirin was discontinued when she was placed on apixaban for a pulmonary embolus.  Extensive negative work-up.   4.  Sialorrhea             -This is commonly associated with PD.  We talked  about treatments.  The patient is not a candidate for oral anticholinergic therapy because of increased risk of confusion and falls.  We discussed Botox (type A and B) and 1% atropine drops.  We discusssed that candy like lemon drops can help by stimulating mm of the oropharynx to induce swallowing.  She is not interested right now   5.    Pulmonary embolus             -This just occurred June, 2023.  Patient now on Eliquis  PAIN:  Are you having pain? Yes: NPRS scale: Unable to state./10 Pain location: knees/quads/hamstrings, and back Pain description: ache Aggravating factors: Standing  Relieving factors: gel and cortisone shots helped temporarily  PRECAUTIONS: None  WEIGHT BEARING RESTRICTIONS No  FALLS: Has patient fallen in last 6 months? No  LIVING ENVIRONMENT: Lives with: lives alone Lives in: House/apartment Stairs: No-has a ramped entrance and can stay on first floor. Has following equipment at home: Single point cane, Grab bars, Ramped entry, and accessible bathrooms.  PLOF: Independent  PATIENT GOALS Feel more stable.  OBJECTIVE:   COGNITION: Overall cognitive status: Within functional limits for tasks assessed   SENSATION: Patient with mildly diminished LT on lateral L lower leg  COORDINATION: Grossly WFL BU and LE.  EDEMA:  Patient with mild swelling in BLE, she reports that the swelling has increased recently.  MUSCLE TONE: WNL   MUSCLE LENGTH: Hamstrings: Right 65 deg; Left 45 deg Hip Flexors to neutral.   POSTURE: No Significant postural limitations  LOWER EXTREMITY ROM:   BLE AROM grossly WFL, but L hip is limited at end ranges due to pain  LOWER EXTREMITY MMT:    MMT Right Eval Left Eval  Hip flexion 4- 3+  Hip extension    Hip abduction    Hip adduction    Hip internal rotation 4- 4-  Hip external rotation    Knee flexion 4- 4-  Knee extension 4- 3+  Ankle dorsiflexion    Ankle plantarflexion 4- 4-  Ankle inversion    Ankle  eversion    (Blank rows = not tested)  BED MOBILITY: MI- has bed  rails, reports increased difficulty rolling.   TRANSFERS: I, MI, but reports increased difficulty rising from softer and low surfaces.  RAMP:  MI  CURB: MI  STAIRS:  Patient has second floor. She can manage them MI, but avoids them for safety.  GAIT: Gait pattern: step through pattern, decreased step length- Right, decreased step length- Left, decreased hip/knee flexion- Right, decreased hip/knee flexion- Left, decreased ankle dorsiflexion- Right, decreased ankle dorsiflexion- Left, and shuffling Distance walked: 45' Assistive device utilized: None or cane Level of assistance: Modified independence Comments: patient reports feeling more unsteady  FUNCTIONAL TESTs:  5 times sit to stand: 14.89 Timed up and go (TUG): 13.47   **Slightly taller seat height   TODAY'S TREATMENT:  03/04/22 NuStep, L5 x 6 minutes Floor ladder-patient walked forward, side stepped, and then performed forward step into/out of ladder, side step in each direction, mild unsteadiness. B side step with alternating step touch on cones, on floor mat, 1x each direction. B side step over orange cones on floor mat. Increased difficulty lifting and controlling LLE due to hip and knee pain. 2 x each direction. B side to side stepping with Yellow Tband resistance at knees, 2 x 10 reps. Seated hip flexion, lifting each leg to step up onto 6" step and back down, 2 x 10 reps each. Heel raises x 10 reps.   02/28/22 HS curls green tband\ 3# LAQ 3# marches in sitting Adjusted her cane as it was too tall Nustep Level 5 x 6 minutes Side stepping over small object Cone toe touch solid surface, then on airex with SPC Side stepping on and off airex On airex ball toss Walking direction changes Walking ball toss On airex balance beam side stepping and tandem walk  Stairs trying step over step on the 4" and 6" Bike x 5 minutes level  3   02/18/22 Patient education  PATIENT EDUCATION: Education details: POC, gentle LE ROM Person educated: Patient Education method: Explanation Education comprehension: verbalized understanding   HOME EXERCISE PROGRAM: TBD    GOALS: Goals reviewed with patient? Yes  SHORT TERM GOALS: Target date: 03/18/2022  I with basic HEP Baseline: Goal status: ongoing   LONG TERM GOALS: Target date: 04/29/2022  I with final HEP Baseline:  Goal status: INITIAL  2.  Decreased TUG to < 10 sec to demonstrate improved balance. Baseline: 13.47 Goal status: INITIAL  3.  Decrease 5x STS to < 12 sec to demonstrate improved BLE strength Baseline: 14.89 Goal status: INITIAL  4.  Patient will return to her Parkinson's cycling class at the [facility] with increased confidence and no pain in R hamstring. Baseline:  Goal status: INITIAL  5.  Paitent will ambulate at least 500' on level/unlevel surfaces with LRAD, MI. Baseline: 80', level, MI Goal status: INITIAL  ASSESSMENT:  CLINICAL IMPRESSION: Patient reports no issues. She tolerated increased challenges to her activities, having increased difficulty with LLE movements, but no LOB noted.  OBJECTIVE IMPAIRMENTS Abnormal gait, decreased balance, decreased coordination, decreased endurance, decreased mobility, difficulty walking, decreased ROM, decreased strength, impaired flexibility, impaired sensation, improper body mechanics, and postural dysfunction.   ACTIVITY LIMITATIONS carrying, lifting, bending, standing, squatting, sleeping, stairs, and locomotion level  PARTICIPATION LIMITATIONS: cleaning, laundry, driving, shopping, and community activity  PERSONAL FACTORS Age are also affecting patient's functional outcome.   REHAB POTENTIAL: Good  CLINICAL DECISION MAKING: Evolving/moderate complexity  EVALUATION COMPLEXITY: Moderate  PLAN: PT FREQUENCY: 2x/week  PT DURATION: 10 weeks  PLANNED INTERVENTIONS: Therapeutic exercises,  Therapeutic activity, Neuromuscular  re-education, Balance training, Gait training, Patient/Family education, Self Care, Joint mobilization, Stair training, and Manual therapy  PLAN FOR NEXT SESSION: HEP and higher level balance activities   Oley Balm DPT 03/04/22 4:27 PM  03/04/2022, 4:27 PM

## 2022-03-07 ENCOUNTER — Ambulatory Visit: Payer: Medicare PPO | Attending: Neurology | Admitting: Physical Therapy

## 2022-03-07 ENCOUNTER — Encounter: Payer: Self-pay | Admitting: Physical Therapy

## 2022-03-07 DIAGNOSIS — R262 Difficulty in walking, not elsewhere classified: Secondary | ICD-10-CM | POA: Diagnosis not present

## 2022-03-07 DIAGNOSIS — M6281 Muscle weakness (generalized): Secondary | ICD-10-CM | POA: Insufficient documentation

## 2022-03-07 DIAGNOSIS — R2681 Unsteadiness on feet: Secondary | ICD-10-CM | POA: Diagnosis not present

## 2022-03-07 DIAGNOSIS — R278 Other lack of coordination: Secondary | ICD-10-CM | POA: Insufficient documentation

## 2022-03-07 NOTE — Therapy (Signed)
OUTPATIENT PHYSICAL THERAPY NEURO EVALUATION   Patient Name: Linda Lee MRN: [id number] DOB:07-07-1936, 85 y.o., female Today's Date: 03/07/2022   PCP: Rodrigo Ran REFERRING PROVIDER: Tat, Octaviano Batty, DO    PT End of Session - 03/07/22 1454     Visit Number 4    PT Start Time 1445    PT Stop Time 1526    PT Time Calculation (min) 41 min    Activity Tolerance Patient tolerated treatment well    Behavior During Therapy [facility] for tasks assessed/performed               Past Medical History:  Diagnosis Date   Anginal pain (HCC)    Angio-edema    Arthritis    Atypical chest pain 07/06/2009   Qualifier: Diagnosis of  By: Clifton James, MD, Christopher     GERD (gastroesophageal reflux disease)    Hyperlipemia    Migraines    Toxic effect of venom of bees, unintentional 06/03/2017   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     denies surgery for this   TONSILLECTOMY     TOTAL KNEE ARTHROPLASTY Right 09/25/2015   Procedure: TOTAL KNEE ARTHROPLASTY;  Surgeon: Ollen Gross, MD;  Location: [facility];  Service: Orthopedics;  Laterality: Right;   Patient Active Problem List   Diagnosis Date Noted   Osteoporosis 02/21/2022   Lung infiltrate 02/11/2022   Acute pulmonary embolism (HCC) 01/19/2022   Elevated troponin level not due myocardial infarction 01/19/2022   Asymptomatic bacteriuria 01/19/2022   Nonsustained ventricular tachycardia (HCC) 10/06/2018   Migraine 10/06/2018   History of TIA (transient ischemic attack) 10/05/2018   Coronary artery calcification seen on CT scan 10/05/2018   Parkinson disease (HCC) 12/10/2017   Carpal tunnel syndrome  of left wrist 12/08/2017   Ulnar neuropathy 12/08/2017   Dyspnea on exertion 06/17/2017   OA (osteoarthritis) of knee 09/25/2015   UTI (lower urinary tract infection) 09/04/2012   Diastolic CHF, chronic (HCC) 09/04/2012   Subjective visual disturbance, right eye 09/03/2012   Vision disturbance 09/03/2012   Ascending aortic aneurysm (HCC) 07/06/2009   Mixed hyperlipidemia 06/30/2009   GERD without esophagitis 06/30/2009   LEG CRAMPS 06/30/2009    ONSET DATE: 01/31/22  REFERRING DIAG: G20 (ICD-10-CM) - Parkinson's disease (HCC)   THERAPY DIAG:  Other lack of coordination  Difficulty in walking, not elsewhere classified  Muscle weakness (generalized)  Unsteadiness on feet  Rationale for Evaluation and Treatment Rehabilitation  SUBJECTIVE:  SUBJECTIVE STATEMENT: Patient reports she has some difficulty climbing into her Zenaida Niece. She feels about the same.  PERTINENT HISTORY: 1.  Parkinsons Disease             -Continue carbidopa/levodopa 25/100, 1.5 tablets 3 times per day             -PT at [facility] - referral sent             -We discussed the new skin biopsies for alpha-synuclein.  Discussed that they have about 95-96% sensitivity and specificity.  Discussed utility of the biopsy.     2.  Severe spinal canal stenosis at L3-L4             -Follows with Dr. Venetia Maxon in the past and seems to be doing ok in this regard.  Discussed with her that Dr. Venetia Maxon has retired and now Dr. Jake Samples has taken over the care   3.  History of expressive aphasia, likely representing complicated migraine              -On Lipitor.  Aspirin was discontinued when she was placed on apixaban for a pulmonary embolus.  Extensive negative work-up.   4.  Sialorrhea             -This is commonly associated with PD.  We talked  about treatments.  The patient is not a candidate for oral anticholinergic therapy because of increased risk of confusion and falls.  We discussed Botox (type A and B) and 1% atropine drops.  We discusssed that candy like lemon drops can help by stimulating mm of the oropharynx to induce swallowing.  She is not interested right now   5.    Pulmonary embolus             -This just occurred June, 2023.  Patient now on Eliquis  PAIN:  Are you having pain? Yes: NPRS scale: Unable to state./10 Pain location: knees/quads/hamstrings, and back Pain description: ache Aggravating factors: Standing  Relieving factors: gel and cortisone shots helped temporarily  PRECAUTIONS: None  WEIGHT BEARING RESTRICTIONS No  FALLS: Has patient fallen in last 6 months? No  LIVING ENVIRONMENT: Lives with: lives alone Lives in: House/apartment Stairs: No-has a ramped entrance and can stay on first floor. Has following equipment at home: Single point cane, Grab bars, Ramped entry, and accessible bathrooms.  PLOF: Independent  PATIENT GOALS Feel more stable.  OBJECTIVE:   COGNITION: Overall cognitive status: Within functional limits for tasks assessed   SENSATION: Patient with mildly diminished LT on lateral L lower leg  COORDINATION: Grossly WFL BU and LE.  EDEMA:  Patient with mild swelling in BLE, she reports that the swelling has increased recently.  MUSCLE TONE: WNL   MUSCLE LENGTH: Hamstrings: Right 65 deg; Left 45 deg Hip Flexors to neutral.   POSTURE: No Significant postural limitations  LOWER EXTREMITY ROM:   BLE AROM grossly WFL, but L hip is limited at end ranges due to pain  LOWER EXTREMITY MMT:    MMT Right Eval Left Eval  Hip flexion 4- 3+  Hip extension    Hip abduction    Hip adduction    Hip internal rotation 4- 4-  Hip external rotation    Knee flexion 4- 4-  Knee extension 4- 3+  Ankle dorsiflexion    Ankle plantarflexion 4- 4-  Ankle inversion    Ankle  eversion    (Blank rows = not tested)  BED MOBILITY: MI- has bed  rails, reports increased difficulty rolling.   TRANSFERS: I, MI, but reports increased difficulty rising from softer and low surfaces.  RAMP:  MI  CURB: MI  STAIRS:  Patient has second floor. She can manage them MI, but avoids them for safety.  GAIT: Gait pattern: step through pattern, decreased step length- Right, decreased step length- Left, decreased hip/knee flexion- Right, decreased hip/knee flexion- Left, decreased ankle dorsiflexion- Right, decreased ankle dorsiflexion- Left, and shuffling Distance walked: 23' Assistive device utilized: None or cane Level of assistance: Modified independence Comments: patient reports feeling more unsteady  FUNCTIONAL TESTs:  5 times sit to stand: 14.89 Timed up and go (TUG): 13.47   **Slightly taller seat height   TODAY'S TREATMENT:  03/07/22 NuStep L5 x 6 minutes Seated hip flexion, lifting each leg up onto 8" box with 1# resistance, 10 reps each, painful for LLE moving up into add. (Simulating lifting legs into her car) Stepping side to side with 1# weight, 10 reps each direction. Step back with ER, reaching back to place cone on mat behind, using SPC for steadiness, 5 reps each way. Side step up onto and off of 2" step, moving to both sides, with SPC, 10 reps each side. Side step up onto 4" step, 10 reps each way Seated shoulder ext, rows against Red Tband resistance, 10 reps each. Stepping in all directions, changing upon command of therapist. No unsteadiness noted with change of direction.   03/04/22 NuStep, L5 x 6 minutes Floor ladder-patient walked forward, side stepped, and then performed forward step into/out of ladder, side step in each direction, mild unsteadiness. B side step with alternating step touch on cones, on floor mat, 1x each direction. B side step over orange cones on floor mat. Increased difficulty lifting and controlling LLE due to hip and knee  pain. 2 x each direction. B side to side stepping with Yellow Tband resistance at knees, 2 x 10 reps. Seated hip flexion, lifting each leg to step up onto 6" step and back down, 2 x 10 reps each. Heel raises x 10 reps.   02/28/22 HS curls green tband\ 3# LAQ 3# marches in sitting Adjusted her cane as it was too tall Nustep Level 5 x 6 minutes Side stepping over small object Cone toe touch solid surface, then on airex with SPC Side stepping on and off airex On airex ball toss Walking direction changes Walking ball toss On airex balance beam side stepping and tandem walk  Stairs trying step over step on the 4" and 6" Bike x 5 minutes level 3   02/18/22 Patient education  PATIENT EDUCATION: Education details: POC, gentle LE ROM Person educated: Patient Education method: Explanation Education comprehension: verbalized understanding   HOME EXERCISE PROGRAM: TBD    GOALS: Goals reviewed with patient? Yes  SHORT TERM GOALS: Target date: 03/18/2022  I with basic HEP Baseline: Goal status: ongoing   LONG TERM GOALS: Target date: 04/29/2022  I with final HEP Baseline:  Goal status: INITIAL  2.  Decreased TUG to < 10 sec to demonstrate improved balance. Baseline: 13.47 Goal status: INITIAL  3.  Decrease 5x STS to < 12 sec to demonstrate improved BLE strength Baseline: 14.89 Goal status: INITIAL  4.  Patient will return to her Parkinson's cycling class at the [facility] with increased confidence and no pain in R hamstring. Baseline:  Goal status: INITIAL  5.  Paitent will ambulate at least 500' on level/unlevel surfaces with LRAD, MI. Baseline: 80', level, MI Goal status:  INITIAL  ASSESSMENT:  CLINICAL IMPRESSION: Patient reports no issues. L hip and knee pain mildly limited her, but she participated well and demonstrated good movement and balance.  OBJECTIVE IMPAIRMENTS Abnormal gait, decreased balance, decreased coordination, decreased endurance, decreased  mobility, difficulty walking, decreased ROM, decreased strength, impaired flexibility, impaired sensation, improper body mechanics, and postural dysfunction.   ACTIVITY LIMITATIONS carrying, lifting, bending, standing, squatting, sleeping, stairs, and locomotion level  PARTICIPATION LIMITATIONS: cleaning, laundry, driving, shopping, and community activity  PERSONAL FACTORS Age are also affecting patient's functional outcome.   REHAB POTENTIAL: Good  CLINICAL DECISION MAKING: Evolving/moderate complexity  EVALUATION COMPLEXITY: Moderate  PLAN: PT FREQUENCY: 2x/week  PT DURATION: 10 weeks  PLANNED INTERVENTIONS: Therapeutic exercises, Therapeutic activity, Neuromuscular re-education, Balance training, Gait training, Patient/Family education, Self Care, Joint mobilization, Stair training, and Manual therapy  PLAN FOR NEXT SESSION: HEP and higher level balance activities   Oley Balm DPT 03/07/22 3:28 PM  03/07/2022, 3:28 PM

## 2022-03-11 ENCOUNTER — Encounter: Payer: Self-pay | Admitting: Physical Therapy

## 2022-03-11 ENCOUNTER — Ambulatory Visit: Payer: Medicare PPO | Admitting: Physical Therapy

## 2022-03-11 DIAGNOSIS — R2681 Unsteadiness on feet: Secondary | ICD-10-CM | POA: Diagnosis not present

## 2022-03-11 DIAGNOSIS — R262 Difficulty in walking, not elsewhere classified: Secondary | ICD-10-CM | POA: Diagnosis not present

## 2022-03-11 DIAGNOSIS — M6281 Muscle weakness (generalized): Secondary | ICD-10-CM

## 2022-03-11 DIAGNOSIS — R278 Other lack of coordination: Secondary | ICD-10-CM

## 2022-03-11 NOTE — Therapy (Signed)
OUTPATIENT PHYSICAL THERAPY NEURO TREATMENT   Patient Name: Linda Lee MRN: [id number] DOB:1936-03-28, 86 y.o., female Today's Date: 03/11/2022   PCP: Rodrigo Ran REFERRING PROVIDER: Vladimir Faster, DO    PT End of Session - 03/11/22 1449     Visit Number 5    Date for PT Re-Evaluation 04/29/22    Authorization Type [employer] 4/19    PT Start Time 1444    PT Stop Time 1529    PT Time Calculation (min) 45 min    Activity Tolerance Patient tolerated treatment well    Behavior During Therapy [facility] for tasks assessed/performed               Past Medical History:  Diagnosis Date   Anginal pain (HCC)    Angio-edema    Arthritis    Atypical chest pain 07/06/2009   Qualifier: Diagnosis of  By: Clifton James, MD, Christopher     GERD (gastroesophageal reflux disease)    Hyperlipemia    Migraines    Toxic effect of venom of bees, unintentional 06/03/2017   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     denies surgery for this   TONSILLECTOMY     TOTAL KNEE ARTHROPLASTY Right 09/25/2015   Procedure: TOTAL KNEE ARTHROPLASTY;  Surgeon: Ollen Gross, MD;  Location: [facility];  Service: Orthopedics;  Laterality: Right;   Patient Active Problem List   Diagnosis Date Noted   Osteoporosis 02/21/2022   Lung infiltrate 02/11/2022   Acute pulmonary embolism (HCC) 01/19/2022   Elevated troponin level not due myocardial infarction 01/19/2022   Asymptomatic bacteriuria 01/19/2022   Nonsustained ventricular tachycardia (HCC) 10/06/2018   Migraine 10/06/2018   History of TIA (transient ischemic attack) 10/05/2018   Coronary artery calcification seen on CT scan  10/05/2018   Parkinson disease (HCC) 12/10/2017   Carpal tunnel syndrome of left wrist 12/08/2017   Ulnar neuropathy 12/08/2017   Dyspnea on exertion 06/17/2017   OA (osteoarthritis) of knee 09/25/2015   UTI (lower urinary tract infection) 09/04/2012   Diastolic CHF, chronic (HCC) 09/04/2012   Subjective visual disturbance, right eye 09/03/2012   Vision disturbance 09/03/2012   Ascending aortic aneurysm (HCC) 07/06/2009   Mixed hyperlipidemia 06/30/2009   GERD without esophagitis 06/30/2009   LEG CRAMPS 06/30/2009    ONSET DATE: 01/31/22  REFERRING DIAG: G20 (ICD-10-CM) - Parkinson's disease (HCC)   THERAPY DIAG:  Other lack of coordination  Difficulty in walking, not elsewhere classified  Muscle weakness (generalized)  Unsteadiness on feet  Rationale for Evaluation and Treatment Rehabilitation  SUBJECTIVE:  SUBJECTIVE STATEMENT: Stamina and balance are the biggest issues, no falls, no stumbles recently  PERTINENT HISTORY: 1.  Parkinsons Disease             -Continue carbidopa/levodopa 25/100, 1.5 tablets 3 times per day             -PT at [facility] - referral sent             -We discussed the new skin biopsies for alpha-synuclein.  Discussed that they have about 95-96% sensitivity and specificity.  Discussed utility of the biopsy.     2.  Severe spinal canal stenosis at L3-L4             -Follows with Dr. Venetia Maxon in the past and seems to be doing ok in this regard.  Discussed with her that Dr. Venetia Maxon has retired and now Dr. Jake Samples has taken over the care   3.  History of expressive aphasia, likely representing complicated migraine              -On Lipitor.  Aspirin was discontinued when she was placed on apixaban for a pulmonary embolus.  Extensive negative work-up.   4.  Sialorrhea              -This is commonly associated with PD.  We talked about treatments.  The patient is not a candidate for oral anticholinergic therapy because of increased risk of confusion and falls.  We discussed Botox (type A and B) and 1% atropine drops.  We discusssed that candy like lemon drops can help by stimulating mm of the oropharynx to induce swallowing.  She is not interested right now   5.    Pulmonary embolus             -This just occurred June, 2023.  Patient now on Eliquis  PAIN:  Are you having pain? Yes: NPRS scale: Unable to state./10 Pain location: knees/quads/hamstrings, and back Pain description: ache Aggravating factors: Standing  Relieving factors: gel and cortisone shots helped temporarily  PRECAUTIONS: None  WEIGHT BEARING RESTRICTIONS No  FALLS: Has patient fallen in last 6 months? No  LIVING ENVIRONMENT: Lives with: lives alone Lives in: House/apartment Stairs: No-has a ramped entrance and can stay on first floor. Has following equipment at home: Single point cane, Grab bars, Ramped entry, and accessible bathrooms.  PLOF: Independent  PATIENT GOALS Feel more stable.  OBJECTIVE:   COGNITION: Overall cognitive status: Within functional limits for tasks assessed   SENSATION: Patient with mildly diminished LT on lateral L lower leg  COORDINATION: Grossly WFL BU and LE.  EDEMA:  Patient with mild swelling in BLE, she reports that the swelling has increased recently.  MUSCLE TONE: WNL   MUSCLE LENGTH: Hamstrings: Right 65 deg; Left 45 deg Hip Flexors to neutral.   POSTURE: No Significant postural limitations  LOWER EXTREMITY ROM:   BLE AROM grossly WFL, but L hip is limited at end ranges due to pain  LOWER EXTREMITY MMT:    MMT Right Eval Left Eval  Hip flexion 4- 3+  Hip extension    Hip abduction    Hip adduction    Hip internal rotation 4- 4-  Hip external rotation    Knee flexion 4- 4-  Knee extension 4- 3+  Ankle dorsiflexion     Ankle plantarflexion 4- 4-  Ankle inversion    Ankle eversion    (Blank rows = not tested)  BED MOBILITY: MI- has bed rails, reports increased  difficulty rolling.   TRANSFERS: I, MI, but reports increased difficulty rising from softer and low surfaces.  RAMP:  MI  CURB: MI  STAIRS:  Patient has second floor. She can manage them MI, but avoids them for safety.  GAIT: Gait pattern: step through pattern, decreased step length- Right, decreased step length- Left, decreased hip/knee flexion- Right, decreased hip/knee flexion- Left, decreased ankle dorsiflexion- Right, decreased ankle dorsiflexion- Left, and shuffling Distance walked: 17' Assistive device utilized: None or cane Level of assistance: Modified independence Comments: patient reports feeling more unsteady  FUNCTIONAL TESTs:  5 times sit to stand: 14.89 Timed up and go (TUG): 13.47   **Slightly taller seat height   TODAY'S TREATMENT:  03/11/22 Nustep Level 5 x 6 minutes Gait outside over and down curbs, then a flight of stairs one at a time Side stepping on and off airex, over black half roll On airex ball toss, beach ball volley ball solid and on airex Walking ball toss, walking side stepping ball toss Direction changes Airex balance beam side stepping and tandem walking Out to car to look at her getting in and out as she reported this being difficult Cone toe touches on the airex  03/07/22 NuStep L5 x 6 minutes Seated hip flexion, lifting each leg up onto 8" box with 1# resistance, 10 reps each, painful for LLE moving up into add. (Simulating lifting legs into her car) Stepping side to side with 1# weight, 10 reps each direction. Step back with ER, reaching back to place cone on mat behind, using SPC for steadiness, 5 reps each way. Side step up onto and off of 2" step, moving to both sides, with SPC, 10 reps each side. Side step up onto 4" step, 10 reps each way Seated shoulder ext, rows against Red Tband  resistance, 10 reps each. Stepping in all directions, changing upon command of therapist. No unsteadiness noted with change of direction.   03/04/22 NuStep, L5 x 6 minutes Floor ladder-patient walked forward, side stepped, and then performed forward step into/out of ladder, side step in each direction, mild unsteadiness. B side step with alternating step touch on cones, on floor mat, 1x each direction. B side step over orange cones on floor mat. Increased difficulty lifting and controlling LLE due to hip and knee pain. 2 x each direction. B side to side stepping with Yellow Tband resistance at knees, 2 x 10 reps. Seated hip flexion, lifting each leg to step up onto 6" step and back down, 2 x 10 reps each. Heel raises x 10 reps.   02/28/22 HS curls green tband\ 3# LAQ 3# marches in sitting Adjusted her cane as it was too tall Nustep Level 5 x 6 minutes Side stepping over small object Cone toe touch solid surface, then on airex with SPC Side stepping on and off airex On airex ball toss Walking direction changes Walking ball toss On airex balance beam side stepping and tandem walk  Stairs trying step over step on the 4" and 6" Bike x 5 minutes level 3   PATIENT EDUCATION: Education details: standing side stepping, marching in place Person educated: Patient Education method: Explanation Education comprehension: verbalized understanding   HOME EXERCISE PROGRAM: TBD    GOALS: Goals reviewed with patient? Yes  SHORT TERM GOALS: Target date: 03/18/2022  I with basic HEP Baseline: Goal status: ongoing   LONG TERM GOALS: Target date: 04/29/2022  I with final HEP Baseline:  Goal status: INITIAL  2.  Decreased TUG to <  10 sec to demonstrate improved balance. Baseline: 13.47 Goal status: INITIAL  3.  Decrease 5x STS to < 12 sec to demonstrate improved BLE strength Baseline: 14.89 Goal status: INITIAL  4.  Patient will return to her Parkinson's cycling class at the  [facility] with increased confidence and no pain in R hamstring. Baseline:  Goal status: INITIAL  5.  Paitent will ambulate at least 500' on level/unlevel surfaces with LRAD, MI. Baseline: 80', level, MI Goal status: INITIAL  ASSESSMENT:  CLINICAL IMPRESSION: Patient reported difficulty with stairs and in and out of the van, the stairs she does one at a time due to the left knee pain, but does fatigue easily.  When she get in the car it appears that her right knee or hip is stiff as she has difficulty clearing the foot over the threshold, she reports it is due to her having weight on the bad leg.  She does have some difficulty with the airex, higher level balance activities and needs SBA.  OBJECTIVE IMPAIRMENTS Abnormal gait, decreased balance, decreased coordination, decreased endurance, decreased mobility, difficulty walking, decreased ROM, decreased strength, impaired flexibility, impaired sensation, improper body mechanics, and postural dysfunction.   ACTIVITY LIMITATIONS carrying, lifting, bending, standing, squatting, sleeping, stairs, and locomotion level  PARTICIPATION LIMITATIONS: cleaning, laundry, driving, shopping, and community activity  PERSONAL FACTORS Age are also affecting patient's functional outcome.   REHAB POTENTIAL: Good  CLINICAL DECISION MAKING: Evolving/moderate complexity  EVALUATION COMPLEXITY: Moderate  PLAN: PT FREQUENCY: 2x/week  PT DURATION: 10 weeks  PLANNED INTERVENTIONS: Therapeutic exercises, Therapeutic activity, Neuromuscular re-education, Balance training, Gait training, Patient/Family education, Self Care, Joint mobilization, Stair training, and Manual therapy  PLAN FOR NEXT SESSION: higher level balance activities    Stacie Glaze, PT 03/11/2022, 2:51 PM

## 2022-03-14 ENCOUNTER — Encounter: Payer: Self-pay | Admitting: Physical Therapy

## 2022-03-14 ENCOUNTER — Ambulatory Visit: Payer: Medicare PPO | Admitting: Physical Therapy

## 2022-03-14 DIAGNOSIS — R2681 Unsteadiness on feet: Secondary | ICD-10-CM

## 2022-03-14 DIAGNOSIS — M6281 Muscle weakness (generalized): Secondary | ICD-10-CM | POA: Diagnosis not present

## 2022-03-14 DIAGNOSIS — R262 Difficulty in walking, not elsewhere classified: Secondary | ICD-10-CM

## 2022-03-14 DIAGNOSIS — R278 Other lack of coordination: Secondary | ICD-10-CM | POA: Diagnosis not present

## 2022-03-14 NOTE — Therapy (Signed)
OUTPATIENT PHYSICAL THERAPY NEURO TREATMENT   Patient Name: Linda Lee MRN: [id number] DOB:1936/03/08, 86 y.o., female Today's Date: 03/14/2022   PCP: Rodrigo Ran REFERRING PROVIDER: Vladimir Faster, DO    PT End of Session - 03/14/22 1451     Visit Number 6    Date for PT Re-Evaluation 04/29/22    Authorization Type [employer] 4/19    PT Start Time 1447    PT Stop Time 1526    PT Time Calculation (min) 39 min    Activity Tolerance Patient tolerated treatment well    Behavior During Therapy [facility] for tasks assessed/performed                Past Medical History:  Diagnosis Date   Anginal pain (HCC)    Angio-edema    Arthritis    Atypical chest pain 07/06/2009   Qualifier: Diagnosis of  By: Clifton James, MD, Christopher     GERD (gastroesophageal reflux disease)    Hyperlipemia    Migraines    Toxic effect of venom of bees, unintentional 06/03/2017   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     denies surgery for this   TONSILLECTOMY     TOTAL KNEE ARTHROPLASTY Right 09/25/2015   Procedure: TOTAL KNEE ARTHROPLASTY;  Surgeon: Ollen Gross, MD;  Location: [facility];  Service: Orthopedics;  Laterality: Right;   Patient Active Problem List   Diagnosis Date Noted   Osteoporosis 02/21/2022   Lung infiltrate 02/11/2022   Acute pulmonary embolism (HCC) 01/19/2022   Elevated troponin level not due myocardial infarction 01/19/2022   Asymptomatic bacteriuria 01/19/2022   Nonsustained ventricular tachycardia (HCC) 10/06/2018   Migraine 10/06/2018   History of TIA (transient ischemic attack) 10/05/2018   Coronary artery calcification seen on CT scan  10/05/2018   Parkinson disease (HCC) 12/10/2017   Carpal tunnel syndrome of left wrist 12/08/2017   Ulnar neuropathy 12/08/2017   Dyspnea on exertion 06/17/2017   OA (osteoarthritis) of knee 09/25/2015   UTI (lower urinary tract infection) 09/04/2012   Diastolic CHF, chronic (HCC) 09/04/2012   Subjective visual disturbance, right eye 09/03/2012   Vision disturbance 09/03/2012   Ascending aortic aneurysm (HCC) 07/06/2009   Mixed hyperlipidemia 06/30/2009   GERD without esophagitis 06/30/2009   LEG CRAMPS 06/30/2009    ONSET DATE: 01/31/22  REFERRING DIAG: G20 (ICD-10-CM) - Parkinson's disease (HCC)   THERAPY DIAG:  Other lack of coordination  Unsteadiness on feet  Difficulty in walking, not elsewhere classified  Muscle weakness (generalized)  Rationale for Evaluation and Treatment Rehabilitation  SUBJECTIVE:  SUBJECTIVE STATEMENT: Patient reports no new issues. Still having difficulty getting into car, but now understands why.  PERTINENT HISTORY: 1.  Parkinsons Disease             -Continue carbidopa/levodopa 25/100, 1.5 tablets 3 times per day             -PT at [facility] - referral sent             -We discussed the new skin biopsies for alpha-synuclein.  Discussed that they have about 95-96% sensitivity and specificity.  Discussed utility of the biopsy.     2.  Severe spinal canal stenosis at L3-L4             -Follows with Dr. Venetia Maxon in the past and seems to be doing ok in this regard.  Discussed with her that Dr. Venetia Maxon has retired and now Dr. Jake Samples has taken over the care   3.  History of expressive aphasia, likely representing complicated migraine              -On Lipitor.  Aspirin was discontinued when she was placed on apixaban for a pulmonary embolus.  Extensive negative  work-up.   4.  Sialorrhea             -This is commonly associated with PD.  We talked about treatments.  The patient is not a candidate for oral anticholinergic therapy because of increased risk of confusion and falls.  We discussed Botox (type A and B) and 1% atropine drops.  We discusssed that candy like lemon drops can help by stimulating mm of the oropharynx to induce swallowing.  She is not interested right now   5.    Pulmonary embolus             -This just occurred June, 2023.  Patient now on Eliquis  PAIN:  Are you having pain? Yes: NPRS scale: Unable to state./10 Pain location: knees/quads/hamstrings, and back Pain description: ache Aggravating factors: Standing  Relieving factors: gel and cortisone shots helped temporarily  PRECAUTIONS: None  WEIGHT BEARING RESTRICTIONS No  FALLS: Has patient fallen in last 6 months? No  LIVING ENVIRONMENT: Lives with: lives alone Lives in: House/apartment Stairs: No-has a ramped entrance and can stay on first floor. Has following equipment at home: Single point cane, Grab bars, Ramped entry, and accessible bathrooms.  PLOF: Independent  PATIENT GOALS Feel more stable.  OBJECTIVE:   COGNITION: Overall cognitive status: Within functional limits for tasks assessed   SENSATION: Patient with mildly diminished LT on lateral L lower leg  COORDINATION: Grossly WFL BU and LE.  EDEMA:  Patient with mild swelling in BLE, she reports that the swelling has increased recently.  MUSCLE TONE: WNL   MUSCLE LENGTH: Hamstrings: Right 65 deg; Left 45 deg Hip Flexors to neutral.   POSTURE: No Significant postural limitations  LOWER EXTREMITY ROM:   BLE AROM grossly WFL, but L hip is limited at end ranges due to pain  LOWER EXTREMITY MMT:    MMT Right Eval Left Eval  Hip flexion 4- 3+  Hip extension    Hip abduction    Hip adduction    Hip internal rotation 4- 4-  Hip external rotation    Knee flexion 4- 4-  Knee extension  4- 3+  Ankle dorsiflexion    Ankle plantarflexion 4- 4-  Ankle inversion    Ankle eversion    (Blank rows = not tested)  BED MOBILITY: MI- has bed  rails, reports increased difficulty rolling.   TRANSFERS: I, MI, but reports increased difficulty rising from softer and low surfaces.  RAMP:  MI  CURB: MI  STAIRS:  Patient has second floor. She can manage them MI, but avoids them for safety.  GAIT: Gait pattern: step through pattern, decreased step length- Right, decreased step length- Left, decreased hip/knee flexion- Right, decreased hip/knee flexion- Left, decreased ankle dorsiflexion- Right, decreased ankle dorsiflexion- Left, and shuffling Distance walked: 18' Assistive device utilized: None or cane Level of assistance: Modified independence Comments: patient reports feeling more unsteady  FUNCTIONAL TESTs:  5 times sit to stand: 14.89 Timed up and go (TUG): 13.47   **Slightly taller seat height   TODAY'S TREATMENT:  03/14/22 NuStep L5 x 6 minutes. Airex beam side stepping and tandem. Much more difficulty with tandem walking. Balance board forward/back, then side to side, no UE support, performed in parallel bars for safety. More difficulty controlling posterior leans. B side stepping against Yellow Tband resistance at her knees, 2 x 10 reps in each direction. B side stepping onto/off of Airex pad x 10, no unsteadiness Ambulated outdoors on unlevel surfaces, emphasizing posture and longer steps  03/11/22 Nustep Level 5 x 6 minutes Gait outside over and down curbs, then a flight of stairs one at a time Side stepping on and off airex, over black half roll On airex ball toss, beach ball volley ball solid and on airex Walking ball toss, walking side stepping ball toss Direction changes Airex balance beam side stepping and tandem walking Out to car to look at her getting in and out as she reported this being difficult Cone toe touches on the airex  03/07/22 NuStep L5 x 6  minutes Seated hip flexion, lifting each leg up onto 8" box with 1# resistance, 10 reps each, painful for LLE moving up into add. (Simulating lifting legs into her car) Stepping side to side with 1# weight, 10 reps each direction. Step back with ER, reaching back to place cone on mat behind, using SPC for steadiness, 5 reps each way. Side step up onto and off of 2" step, moving to both sides, with SPC, 10 reps each side. Side step up onto 4" step, 10 reps each way Seated shoulder ext, rows against Red Tband resistance, 10 reps each. Stepping in all directions, changing upon command of therapist. No unsteadiness noted with change of direction.   03/04/22 NuStep, L5 x 6 minutes Floor ladder-patient walked forward, side stepped, and then performed forward step into/out of ladder, side step in each direction, mild unsteadiness. B side step with alternating step touch on cones, on floor mat, 1x each direction. B side step over orange cones on floor mat. Increased difficulty lifting and controlling LLE due to hip and knee pain. 2 x each direction. B side to side stepping with Yellow Tband resistance at knees, 2 x 10 reps. Seated hip flexion, lifting each leg to step up onto 6" step and back down, 2 x 10 reps each. Heel raises x 10 reps.   02/28/22 HS curls green tband\ 3# LAQ 3# marches in sitting Adjusted her cane as it was too tall Nustep Level 5 x 6 minutes Side stepping over small object Cone toe touch solid surface, then on airex with SPC Side stepping on and off airex On airex ball toss Walking direction changes Walking ball toss On airex balance beam side stepping and tandem walk  Stairs trying step over step on the 4" and 6" Bike x  5 minutes level 3   PATIENT EDUCATION: Education details: standing side stepping, marching in place Person educated: Patient Education method: Explanation Education comprehension: verbalized understanding   HOME EXERCISE  PROGRAM: TBD    GOALS: Goals reviewed with patient? Yes  SHORT TERM GOALS: Target date: 03/18/2022  I with basic HEP Baseline: Goal status: ongoing   LONG TERM GOALS: Target date: 04/29/2022  I with final HEP Baseline:  Goal status: INITIAL  2.  Decreased TUG to < 10 sec to demonstrate improved balance. Baseline: 13.47 Goal status: INITIAL  3.  Decrease 5x STS to < 12 sec to demonstrate improved BLE strength Baseline: 14.89 Goal status: INITIAL  4.  Patient will return to her Parkinson's cycling class at the [facility] with increased confidence and no pain in R hamstring. Baseline:  Goal status: INITIAL  5.  Paitent will ambulate at least 500' on level/unlevel surfaces with LRAD, MI. Baseline: 80', level, MI Goal status: INITIAL  ASSESSMENT:  CLINICAL IMPRESSION: Patient reports continued difficulty accessing her car, but better understanding now. Treatment continued to progress strength and balance, also education for safe gait techniques.  OBJECTIVE IMPAIRMENTS Abnormal gait, decreased balance, decreased coordination, decreased endurance, decreased mobility, difficulty walking, decreased ROM, decreased strength, impaired flexibility, impaired sensation, improper body mechanics, and postural dysfunction.   ACTIVITY LIMITATIONS carrying, lifting, bending, standing, squatting, sleeping, stairs, and locomotion level  PARTICIPATION LIMITATIONS: cleaning, laundry, driving, shopping, and community activity  PERSONAL FACTORS Age are also affecting patient's functional outcome.   REHAB POTENTIAL: Good  CLINICAL DECISION MAKING: Evolving/moderate complexity  EVALUATION COMPLEXITY: Moderate  PLAN: PT FREQUENCY: 2x/week  PT DURATION: 10 weeks  PLANNED INTERVENTIONS: Therapeutic exercises, Therapeutic activity, Neuromuscular re-education, Balance training, Gait training, Patient/Family education, Self Care, Joint mobilization, Stair training, and Manual therapy  PLAN FOR  NEXT SESSION: higher level balance activities    Oley Balm DPT 03/14/22 3:29 PM  03/14/2022, 3:29 PM

## 2022-03-18 ENCOUNTER — Ambulatory Visit: Payer: Medicare PPO

## 2022-03-18 DIAGNOSIS — R278 Other lack of coordination: Secondary | ICD-10-CM | POA: Diagnosis not present

## 2022-03-18 DIAGNOSIS — M6281 Muscle weakness (generalized): Secondary | ICD-10-CM

## 2022-03-18 DIAGNOSIS — R2681 Unsteadiness on feet: Secondary | ICD-10-CM

## 2022-03-18 DIAGNOSIS — R262 Difficulty in walking, not elsewhere classified: Secondary | ICD-10-CM | POA: Diagnosis not present

## 2022-03-18 NOTE — Therapy (Signed)
OUTPATIENT PHYSICAL THERAPY NEURO TREATMENT   Patient Name: Linda Lee MRN: [id number] DOB:August 03, 1936, 86 y.o., female Today's Date: 03/18/2022   PCP: Rodrigo Ran REFERRING PROVIDER: Vladimir Faster, DO    PT End of Session - 03/18/22 1448     Visit Number 7    Date for PT Re-Evaluation 04/29/22    Authorization Type [employer] 4/19    PT Start Time 1445    PT Stop Time 1530    PT Time Calculation (min) 45 min    Activity Tolerance Patient tolerated treatment well    Behavior During Therapy [facility] for tasks assessed/performed                 Past Medical History:  Diagnosis Date   Anginal pain (HCC)    Angio-edema    Arthritis    Atypical chest pain 07/06/2009   Qualifier: Diagnosis of  By: Clifton James, MD, Christopher     GERD (gastroesophageal reflux disease)    Hyperlipemia    Migraines    Toxic effect of venom of bees, unintentional 06/03/2017   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     denies surgery for this   TONSILLECTOMY     TOTAL KNEE ARTHROPLASTY Right 09/25/2015   Procedure: TOTAL KNEE ARTHROPLASTY;  Surgeon: Ollen Gross, MD;  Location: [facility];  Service: Orthopedics;  Laterality: Right;   Patient Active Problem List   Diagnosis Date Noted   Osteoporosis 02/21/2022   Lung infiltrate 02/11/2022   Acute pulmonary embolism (HCC) 01/19/2022   Elevated troponin level not due myocardial infarction 01/19/2022   Asymptomatic bacteriuria 01/19/2022   Nonsustained ventricular tachycardia (HCC) 10/06/2018   Migraine 10/06/2018   History of TIA (transient ischemic attack) 10/05/2018   Coronary artery calcification seen on CT  scan 10/05/2018   Parkinson disease (HCC) 12/10/2017   Carpal tunnel syndrome of left wrist 12/08/2017   Ulnar neuropathy 12/08/2017   Dyspnea on exertion 06/17/2017   OA (osteoarthritis) of knee 09/25/2015   UTI (lower urinary tract infection) 09/04/2012   Diastolic CHF, chronic (HCC) 09/04/2012   Subjective visual disturbance, right eye 09/03/2012   Vision disturbance 09/03/2012   Ascending aortic aneurysm (HCC) 07/06/2009   Mixed hyperlipidemia 06/30/2009   GERD without esophagitis 06/30/2009   LEG CRAMPS 06/30/2009    ONSET DATE: 01/31/22  REFERRING DIAG: G20 (ICD-10-CM) - Parkinson's disease (HCC)   THERAPY DIAG:  Other lack of coordination  Unsteadiness on feet  Difficulty in walking, not elsewhere classified  Muscle weakness (generalized)  Rationale for Evaluation and Treatment Rehabilitation  SUBJECTIVE:  SUBJECTIVE STATEMENT: Patient reports no new issues, no falls. She states I feel like my balance is getting better.   PERTINENT HISTORY: 1.  Parkinsons Disease             -Continue carbidopa/levodopa 25/100, 1.5 tablets 3 times per day             -PT at [facility] - referral sent             -We discussed the new skin biopsies for alpha-synuclein.  Discussed that they have about 95-96% sensitivity and specificity.  Discussed utility of the biopsy.     2.  Severe spinal canal stenosis at L3-L4             -Follows with Dr. Venetia Maxon in the past and seems to be doing ok in this regard.  Discussed with her that Dr. Venetia Maxon has retired and now Dr. Jake Samples has taken over the care   3.  History of expressive aphasia, likely representing complicated migraine              -On Lipitor.  Aspirin was discontinued when she was placed on apixaban for a pulmonary embolus.  Extensive negative  work-up.   4.  Sialorrhea             -This is commonly associated with PD.  We talked about treatments.  The patient is not a candidate for oral anticholinergic therapy because of increased risk of confusion and falls.  We discussed Botox (type A and B) and 1% atropine drops.  We discusssed that candy like lemon drops can help by stimulating mm of the oropharynx to induce swallowing.  She is not interested right now   5.    Pulmonary embolus             -This just occurred June, 2023.  Patient now on Eliquis  PAIN:  Are you having pain? Yes: NPRS scale: Unable to state./10 Pain location: knees/quads/hamstrings, and back Pain description: ache Aggravating factors: Standing  Relieving factors: gel and cortisone shots helped temporarily  PRECAUTIONS: None  WEIGHT BEARING RESTRICTIONS No  FALLS: Has patient fallen in last 6 months? No  LIVING ENVIRONMENT: Lives with: lives alone Lives in: House/apartment Stairs: No-has a ramped entrance and can stay on first floor. Has following equipment at home: Single point cane, Grab bars, Ramped entry, and accessible bathrooms.  PLOF: Independent  PATIENT GOALS Feel more stable.  OBJECTIVE:   COGNITION: Overall cognitive status: Within functional limits for tasks assessed   SENSATION: Patient with mildly diminished LT on lateral L lower leg  COORDINATION: Grossly WFL BU and LE.  EDEMA:  Patient with mild swelling in BLE, she reports that the swelling has increased recently.  MUSCLE TONE: WNL   MUSCLE LENGTH: Hamstrings: Right 65 deg; Left 45 deg Hip Flexors to neutral.   POSTURE: No Significant postural limitations  LOWER EXTREMITY ROM:   BLE AROM grossly WFL, but L hip is limited at end ranges due to pain  LOWER EXTREMITY MMT:    MMT Right Eval Left Eval  Hip flexion 4- 3+  Hip extension    Hip abduction    Hip adduction    Hip internal rotation 4- 4-  Hip external rotation    Knee flexion 4- 4-  Knee extension  4- 3+  Ankle dorsiflexion    Ankle plantarflexion 4- 4-  Ankle inversion    Ankle eversion    (Blank rows = not tested)  BED MOBILITY:  MI- has bed rails, reports increased difficulty rolling.   TRANSFERS: I, MI, but reports increased difficulty rising from softer and low surfaces.  RAMP:  MI  CURB: MI  STAIRS:  Patient has second floor. She can manage them MI, but avoids them for safety.  GAIT: Gait pattern: step through pattern, decreased step length- Right, decreased step length- Left, decreased hip/knee flexion- Right, decreased hip/knee flexion- Left, decreased ankle dorsiflexion- Right, decreased ankle dorsiflexion- Left, and shuffling Distance walked: 1' Assistive device utilized: None or cane Level of assistance: Modified independence Comments: patient reports feeling more unsteady  FUNCTIONAL TESTs:  5 times sit to stand: 14.89 Timed up and go (TUG): 13.47   **Slightly taller seat height   TODAY'S TREATMENT:  03/18/22 Nustep L5 x6  Walking on beam Side steps onto airex  Step ups 6" Resisted gait 10# forwards, side steps  LAQ 5# 2x10  STS on airex yellow ball push out 2x10   03/14/22 NuStep L5 x 6 minutes. Airex beam side stepping and tandem. Much more difficulty with tandem walking. Balance board forward/back, then side to side, no UE support, performed in parallel bars for safety. More difficulty controlling posterior leans. B side stepping against Yellow Tband resistance at her knees, 2 x 10 reps in each direction. B side stepping onto/off of Airex pad x 10, no unsteadiness Ambulated outdoors on unlevel surfaces, emphasizing posture and longer steps  03/11/22 Nustep Level 5 x 6 minutes Gait outside over and down curbs, then a flight of stairs one at a time Side stepping on and off airex, over black half roll On airex ball toss, beach ball volley ball solid and on airex Walking ball toss, walking side stepping ball toss Direction changes Airex balance  beam side stepping and tandem walking Out to car to look at her getting in and out as she reported this being difficult Cone toe touches on the airex  03/07/22 NuStep L5 x 6 minutes Seated hip flexion, lifting each leg up onto 8" box with 1# resistance, 10 reps each, painful for LLE moving up into add. (Simulating lifting legs into her car) Stepping side to side with 1# weight, 10 reps each direction. Step back with ER, reaching back to place cone on mat behind, using SPC for steadiness, 5 reps each way. Side step up onto and off of 2" step, moving to both sides, with SPC, 10 reps each side. Side step up onto 4" step, 10 reps each way Seated shoulder ext, rows against Red Tband resistance, 10 reps each. Stepping in all directions, changing upon command of therapist. No unsteadiness noted with change of direction.   03/04/22 NuStep, L5 x 6 minutes Floor ladder-patient walked forward, side stepped, and then performed forward step into/out of ladder, side step in each direction, mild unsteadiness. B side step with alternating step touch on cones, on floor mat, 1x each direction. B side step over orange cones on floor mat. Increased difficulty lifting and controlling LLE due to hip and knee pain. 2 x each direction. B side to side stepping with Yellow Tband resistance at knees, 2 x 10 reps. Seated hip flexion, lifting each leg to step up onto 6" step and back down, 2 x 10 reps each. Heel raises x 10 reps.   02/28/22 HS curls green tband\ 3# LAQ 3# marches in sitting Adjusted her cane as it was too tall Nustep Level 5 x 6 minutes Side stepping over small object Cone toe touch solid surface, then on airex  with SPC Side stepping on and off airex On airex ball toss Walking direction changes Walking ball toss On airex balance beam side stepping and tandem walk  Stairs trying step over step on the 4" and 6" Bike x 5 minutes level 3   PATIENT EDUCATION: Education details: standing side  stepping, marching in place Person educated: Patient Education method: Explanation Education comprehension: verbalized understanding   HOME EXERCISE PROGRAM: TBD    GOALS: Goals reviewed with patient? Yes  SHORT TERM GOALS: Target date: 03/18/2022  I with basic HEP Baseline: Goal status: ongoing   LONG TERM GOALS: Target date: 04/29/2022  I with final HEP Baseline:  Goal status: INITIAL  2.  Decreased TUG to < 10 sec to demonstrate improved balance. Baseline: 13.47 Goal status: INITIAL  3.  Decrease 5x STS to < 12 sec to demonstrate improved BLE strength Baseline: 14.89 Goal status: INITIAL  4.  Patient will return to her Parkinson's cycling class at the [facility] with increased confidence and no pain in R hamstring. Baseline:  Goal status: INITIAL  5.  Paitent will ambulate at least 500' on level/unlevel surfaces with LRAD, MI. Baseline: 80', level, MI Goal status: INITIAL  ASSESSMENT:  CLINICAL IMPRESSION: Treatment continued on progressive strength and balance. She tolerated session really well and does a good job with all balance activities. Requires CGA for safety on airex and with resisted gait. Continue with strengthening and balance training.   OBJECTIVE IMPAIRMENTS Abnormal gait, decreased balance, decreased coordination, decreased endurance, decreased mobility, difficulty walking, decreased ROM, decreased strength, impaired flexibility, impaired sensation, improper body mechanics, and postural dysfunction.   ACTIVITY LIMITATIONS carrying, lifting, bending, standing, squatting, sleeping, stairs, and locomotion level  PARTICIPATION LIMITATIONS: cleaning, laundry, driving, shopping, and community activity  PERSONAL FACTORS Age are also affecting patient's functional outcome.   REHAB POTENTIAL: Good  CLINICAL DECISION MAKING: Evolving/moderate complexity  EVALUATION COMPLEXITY: Moderate  PLAN: PT FREQUENCY: 2x/week  PT DURATION: 10 weeks  PLANNED  INTERVENTIONS: Therapeutic exercises, Therapeutic activity, Neuromuscular re-education, Balance training, Gait training, Patient/Family education, Self Care, Joint mobilization, Stair training, and Manual therapy  PLAN FOR NEXT SESSION: higher level balance activities    Oley Balm DPT 03/18/22 3:33 PM  03/18/2022, 3:33 PM

## 2022-03-19 ENCOUNTER — Other Ambulatory Visit: Payer: Self-pay | Admitting: Neurology

## 2022-03-19 DIAGNOSIS — G20 Parkinson's disease: Secondary | ICD-10-CM

## 2022-03-20 ENCOUNTER — Ambulatory Visit (INDEPENDENT_AMBULATORY_CARE_PROVIDER_SITE_OTHER): Payer: Medicare PPO

## 2022-03-20 DIAGNOSIS — T63441D Toxic effect of venom of bees, accidental (unintentional), subsequent encounter: Secondary | ICD-10-CM | POA: Diagnosis not present

## 2022-03-21 ENCOUNTER — Ambulatory Visit: Payer: Medicare PPO | Admitting: Physical Therapy

## 2022-03-21 ENCOUNTER — Encounter: Payer: Self-pay | Admitting: Physical Therapy

## 2022-03-21 DIAGNOSIS — R262 Difficulty in walking, not elsewhere classified: Secondary | ICD-10-CM

## 2022-03-21 DIAGNOSIS — M6281 Muscle weakness (generalized): Secondary | ICD-10-CM | POA: Diagnosis not present

## 2022-03-21 DIAGNOSIS — R2681 Unsteadiness on feet: Secondary | ICD-10-CM

## 2022-03-21 DIAGNOSIS — R278 Other lack of coordination: Secondary | ICD-10-CM

## 2022-03-21 NOTE — Therapy (Signed)
OUTPATIENT PHYSICAL THERAPY NEURO TREATMENT   Patient Name: Linda Lee MRN: [id number] DOB:[birth year], 86 y.o., female Today's Date: 03/21/2022   PCP: Rodrigo Ran REFERRING PROVIDER: Vladimir Faster, DO    PT End of Session - 03/21/22 1452     Visit Number 8    Date for PT Re-Evaluation 04/29/22    Authorization Type [employer] 4/19    PT Start Time 1448    PT Stop Time 1526    PT Time Calculation (min) 38 min    Activity Tolerance Patient tolerated treatment well    Behavior During Therapy [facility] for tasks assessed/performed                  Past Medical History:  Diagnosis Date   Anginal pain (HCC)    Angio-edema    Arthritis    Atypical chest pain 07/06/2009   Qualifier: Diagnosis of  By: Clifton James, MD, Christopher     GERD (gastroesophageal reflux disease)    Hyperlipemia    Migraines    Toxic effect of venom of bees, unintentional 06/03/2017   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     denies surgery for this   TONSILLECTOMY     TOTAL KNEE ARTHROPLASTY Right 09/25/2015   Procedure: TOTAL KNEE ARTHROPLASTY;  Surgeon: Ollen Gross, MD;  Location: [facility];  Service: Orthopedics;  Laterality: Right;   Patient Active Problem List   Diagnosis Date Noted   Osteoporosis 02/21/2022   Lung infiltrate 02/11/2022   Acute pulmonary embolism (HCC) 01/19/2022   Elevated troponin level not due myocardial infarction 01/19/2022   Asymptomatic bacteriuria 01/19/2022   Nonsustained ventricular tachycardia (HCC) 10/06/2018   Migraine 10/06/2018   History of TIA (transient ischemic attack) 10/05/2018   Coronary artery calcification seen on CT  scan 10/05/2018   Parkinson disease (HCC) 12/10/2017   Carpal tunnel syndrome of left wrist 12/08/2017   Ulnar neuropathy 12/08/2017   Dyspnea on exertion 06/17/2017   OA (osteoarthritis) of knee 09/25/2015   UTI (lower urinary tract infection) 09/04/2012   Diastolic CHF, chronic (HCC) 09/04/2012   Subjective visual disturbance, right eye 09/03/2012   Vision disturbance 09/03/2012   Ascending aortic aneurysm (HCC) 07/06/2009   Mixed hyperlipidemia 06/30/2009   GERD without esophagitis 06/30/2009   LEG CRAMPS 06/30/2009    ONSET DATE: 01/31/22  REFERRING DIAG: G20 (ICD-10-CM) - Parkinson's disease (HCC)   THERAPY DIAG:  Other lack of coordination  Unsteadiness on feet  Difficulty in walking, not elsewhere classified  Muscle weakness (generalized)  Rationale for Evaluation and Treatment Rehabilitation  SUBJECTIVE:  SUBJECTIVE STATEMENT: Patient reports that today is a bit of a tough. She is having more difficulty moving.  PERTINENT HISTORY: 1.  Parkinsons Disease             -Continue carbidopa/levodopa 25/100, 1.5 tablets 3 times per day             -PT at [facility] - referral sent             -We discussed the new skin biopsies for alpha-synuclein.  Discussed that they have about 95-96% sensitivity and specificity.  Discussed utility of the biopsy.     2.  Severe spinal canal stenosis at L3-L4             -Follows with Dr. Venetia Maxon in the past and seems to be doing ok in this regard.  Discussed with her that Dr. Venetia Maxon has retired and now Dr. Jake Samples has taken over the care   3.  History of expressive aphasia, likely representing complicated migraine              -On Lipitor.  Aspirin was discontinued when she was placed on apixaban for a pulmonary embolus.  Extensive negative work-up.    4.  Sialorrhea             -This is commonly associated with PD.  We talked about treatments.  The patient is not a candidate for oral anticholinergic therapy because of increased risk of confusion and falls.  We discussed Botox (type A and B) and 1% atropine drops.  We discusssed that candy like lemon drops can help by stimulating mm of the oropharynx to induce swallowing.  She is not interested right now   5.    Pulmonary embolus             -This just occurred June, 2023.  Patient now on Eliquis  PAIN:  Are you having pain? Yes: NPRS scale: Unable to state./10 Pain location: knees/quads/hamstrings, and back Pain description: ache Aggravating factors: Standing  Relieving factors: gel and cortisone shots helped temporarily  PRECAUTIONS: None  WEIGHT BEARING RESTRICTIONS No  FALLS: Has patient fallen in last 6 months? No  LIVING ENVIRONMENT: Lives with: lives alone Lives in: House/apartment Stairs: No-has a ramped entrance and can stay on first floor. Has following equipment at home: Single point cane, Grab bars, Ramped entry, and accessible bathrooms.  PLOF: Independent  PATIENT GOALS Feel more stable.  OBJECTIVE:   COGNITION: Overall cognitive status: Within functional limits for tasks assessed   SENSATION: Patient with mildly diminished LT on lateral L lower leg  COORDINATION: Grossly WFL BU and LE.  EDEMA:  Patient with mild swelling in BLE, she reports that the swelling has increased recently.  MUSCLE TONE: WNL   MUSCLE LENGTH: Hamstrings: Right 65 deg; Left 45 deg Hip Flexors to neutral.   POSTURE: No Significant postural limitations  LOWER EXTREMITY ROM:   BLE AROM grossly WFL, but L hip is limited at end ranges due to pain  LOWER EXTREMITY MMT:    MMT Right Eval Left Eval  Hip flexion 4- 3+  Hip extension    Hip abduction    Hip adduction    Hip internal rotation 4- 4-  Hip external rotation    Knee flexion 4- 4-  Knee extension 4- 3+   Ankle dorsiflexion    Ankle plantarflexion 4- 4-  Ankle inversion    Ankle eversion    (Blank rows = not tested)  BED MOBILITY: MI- has  bed rails, reports increased difficulty rolling.   TRANSFERS: I, MI, but reports increased difficulty rising from softer and low surfaces.  RAMP:  MI  CURB: MI  STAIRS:  Patient has second floor. She can manage them MI, but avoids them for safety.  GAIT: Gait pattern: step through pattern, decreased step length- Right, decreased step length- Left, decreased hip/knee flexion- Right, decreased hip/knee flexion- Left, decreased ankle dorsiflexion- Right, decreased ankle dorsiflexion- Left, and shuffling Distance walked: 58' Assistive device utilized: None or cane Level of assistance: Modified independence Comments: patient reports feeling more unsteady  FUNCTIONAL TESTs:  5 times sit to stand: 14.89 Timed up and go (TUG): 13.47   **Slightly taller seat height   TODAY'S TREATMENT:  03/21/22 Recumbent L3.5 x 6 minutes Side stepping onto and off Air ex pad x 10 in each direction with UUE support Step back into rotation to the R, weight shift back to touch wall and return, 5 times to each side, then 5 x alternating side. No unsteadiness. Walking on floor mat, forward, then back, then side stepping. Performed alternate taps on wide cone on mat, then side stepped over and back. Difficulty lifting each foot up and over cone.   03/18/22 Nustep L5 x6  Walking on beam Side steps onto airex  Step ups 6" Resisted gait 10# forwards, side steps  LAQ 5# 2x10  STS on airex yellow ball push out 2x10   03/14/22 NuStep L5 x 6 minutes. Airex beam side stepping and tandem. Much more difficulty with tandem walking. Balance board forward/back, then side to side, no UE support, performed in parallel bars for safety. More difficulty controlling posterior leans. B side stepping against Yellow Tband resistance at her knees, 2 x 10 reps in each direction. B  side stepping onto/off of Airex pad x 10, no unsteadiness Ambulated outdoors on unlevel surfaces, emphasizing posture and longer steps  03/11/22 Nustep Level 5 x 6 minutes Gait outside over and down curbs, then a flight of stairs one at a time Side stepping on and off airex, over black half roll On airex ball toss, beach ball volley ball solid and on airex Walking ball toss, walking side stepping ball toss Direction changes Airex balance beam side stepping and tandem walking Out to car to look at her getting in and out as she reported this being difficult Cone toe touches on the airex  03/07/22 NuStep L5 x 6 minutes Seated hip flexion, lifting each leg up onto 8" box with 1# resistance, 10 reps each, painful for LLE moving up into add. (Simulating lifting legs into her car) Stepping side to side with 1# weight, 10 reps each direction. Step back with ER, reaching back to place cone on mat behind, using SPC for steadiness, 5 reps each way. Side step up onto and off of 2" step, moving to both sides, with SPC, 10 reps each side. Side step up onto 4" step, 10 reps each way Seated shoulder ext, rows against Red Tband resistance, 10 reps each. Stepping in all directions, changing upon command of therapist. No unsteadiness noted with change of direction.   03/04/22 NuStep, L5 x 6 minutes Floor ladder-patient walked forward, side stepped, and then performed forward step into/out of ladder, side step in each direction, mild unsteadiness. B side step with alternating step touch on cones, on floor mat, 1x each direction. B side step over orange cones on floor mat. Increased difficulty lifting and controlling LLE due to hip and knee pain. 2 x each  direction. B side to side stepping with Yellow Tband resistance at knees, 2 x 10 reps. Seated hip flexion, lifting each leg to step up onto 6" step and back down, 2 x 10 reps each. Heel raises x 10 reps.   02/28/22 HS curls green tband\ 3# LAQ 3# marches  in sitting Adjusted her cane as it was too tall Nustep Level 5 x 6 minutes Side stepping over small object Cone toe touch solid surface, then on airex with SPC Side stepping on and off airex On airex ball toss Walking direction changes Walking ball toss On airex balance beam side stepping and tandem walk  Stairs trying step over step on the 4" and 6" Bike x 5 minutes level 3   PATIENT EDUCATION: Education details: standing side stepping, marching in place Person educated: Patient Education method: Explanation Education comprehension: verbalized understanding   HOME EXERCISE PROGRAM: TBD    GOALS: Goals reviewed with patient? Yes  SHORT TERM GOALS: Target date: 03/18/2022  I with basic HEP Baseline: Goal status: ongoing   LONG TERM GOALS: Target date: 04/29/2022  I with final HEP Baseline:  Goal status: INITIAL  2.  Decreased TUG to < 10 sec to demonstrate improved balance. Baseline: 13.47 Goal status: INITIAL  3.  Decrease 5x STS to < 12 sec to demonstrate improved BLE strength Baseline: 14.89 Goal status: INITIAL  4.  Patient will return to her Parkinson's cycling class at the [facility] with increased confidence and no pain in R hamstring. Baseline:  Goal status: ongoing progress  5.  Paitent will ambulate at least 500' on level/unlevel surfaces with LRAD, MI. Baseline: 80', level, MI Goal status: ongoing  ASSESSMENT:  CLINICAL IMPRESSION: Patient reports today is not her best day. In spite of this, she performed progressively more difficult balance and big movement activities. She had more difficulty with RLE unweighted throughout, due to difficulty stabilizing in L stance.  OBJECTIVE IMPAIRMENTS Abnormal gait, decreased balance, decreased coordination, decreased endurance, decreased mobility, difficulty walking, decreased ROM, decreased strength, impaired flexibility, impaired sensation, improper body mechanics, and postural dysfunction.   ACTIVITY  LIMITATIONS carrying, lifting, bending, standing, squatting, sleeping, stairs, and locomotion level  PARTICIPATION LIMITATIONS: cleaning, laundry, driving, shopping, and community activity  PERSONAL FACTORS Age are also affecting patient's functional outcome.   REHAB POTENTIAL: Good  CLINICAL DECISION MAKING: Evolving/moderate complexity  EVALUATION COMPLEXITY: Moderate  PLAN: PT FREQUENCY: 2x/week  PT DURATION: 10 weeks  PLANNED INTERVENTIONS: Therapeutic exercises, Therapeutic activity, Neuromuscular re-education, Balance training, Gait training, Patient/Family education, Self Care, Joint mobilization, Stair training, and Manual therapy  PLAN FOR NEXT SESSION: higher level balance activities    Oley Balm DPT 03/21/22 3:24 PM  03/21/2022, 3:24 PM

## 2022-04-01 ENCOUNTER — Ambulatory Visit (HOSPITAL_COMMUNITY): Payer: Medicare PPO | Attending: Cardiology

## 2022-04-01 DIAGNOSIS — I2609 Other pulmonary embolism with acute cor pulmonale: Secondary | ICD-10-CM | POA: Insufficient documentation

## 2022-04-01 LAB — ECHOCARDIOGRAM COMPLETE
Area-P 1/2: 3.63 cm2
P 1/2 time: 770 msec
S' Lateral: 2.9 cm

## 2022-04-04 DIAGNOSIS — M1712 Unilateral primary osteoarthritis, left knee: Secondary | ICD-10-CM | POA: Diagnosis not present

## 2022-04-29 ENCOUNTER — Encounter: Payer: Self-pay | Admitting: Nurse Practitioner

## 2022-04-29 ENCOUNTER — Ambulatory Visit: Payer: Medicare PPO | Admitting: Nurse Practitioner

## 2022-04-29 ENCOUNTER — Ambulatory Visit (INDEPENDENT_AMBULATORY_CARE_PROVIDER_SITE_OTHER): Payer: Medicare PPO

## 2022-04-29 VITALS — BP 126/84 | HR 53 | Ht 64.5 in | Wt 157.6 lb

## 2022-04-29 DIAGNOSIS — I2609 Other pulmonary embolism with acute cor pulmonale: Secondary | ICD-10-CM | POA: Diagnosis not present

## 2022-04-29 DIAGNOSIS — R918 Other nonspecific abnormal finding of lung field: Secondary | ICD-10-CM | POA: Diagnosis not present

## 2022-04-29 DIAGNOSIS — R5383 Other fatigue: Secondary | ICD-10-CM

## 2022-04-29 LAB — CBC WITH DIFFERENTIAL/PLATELET
Basophils Absolute: 0 10*3/uL (ref 0.0–0.1)
Basophils Relative: 0.4 % (ref 0.0–3.0)
Eosinophils Absolute: 0.1 10*3/uL (ref 0.0–0.7)
Eosinophils Relative: 1.4 % (ref 0.0–5.0)
HCT: 38 % (ref 36.0–46.0)
Hemoglobin: 12.7 g/dL (ref 12.0–15.0)
Lymphocytes Relative: 26.1 % (ref 12.0–46.0)
Lymphs Abs: 1.3 10*3/uL (ref 0.7–4.0)
MCHC: 33.4 g/dL (ref 30.0–36.0)
MCV: 94.2 fl (ref 78.0–100.0)
Monocytes Absolute: 0.3 10*3/uL (ref 0.1–1.0)
Monocytes Relative: 6 % (ref 3.0–12.0)
Neutro Abs: 3.4 10*3/uL (ref 1.4–7.7)
Neutrophils Relative %: 66.1 % (ref 43.0–77.0)
Platelets: 139 10*3/uL — ABNORMAL LOW (ref 150.0–400.0)
RBC: 4.04 Mil/uL (ref 3.87–5.11)
RDW: 14.8 % (ref 11.5–15.5)
WBC: 5.1 10*3/uL (ref 4.0–10.5)

## 2022-04-29 LAB — TSH: TSH: 2.33 u[IU]/mL (ref 0.35–5.50)

## 2022-04-29 NOTE — Assessment & Plan Note (Signed)
Previously found to have right lower lobe infiltrate, suspected to be related to pulmonary embolism.  She had interval improvement with mild residual opacities on CXR from 02/11/2022.  We will recheck today to ensure this has resolved.

## 2022-04-29 NOTE — Patient Instructions (Signed)
Continue Eliquis 5 mg Twice daily. Continue with bleeding precautions and if you fall and hit your head, please seek emergency care  Chest x ray today   Labs today - CBC and TSH   Follow up in 6 months with Dr. Chase Caller or sooner if needed

## 2022-04-29 NOTE — Assessment & Plan Note (Signed)
Unprovoked acute submassive PE hospitalized 01/19/2022 to 01/22/2022. Recommendations for lifelong anticoagulation, but at minimum 3 months of therapy. She is tolerating it well.  She was agreeable to continuing therapy today.  Reviewed risks again; verbalized understanding.  Recent echo was overall unremarkable; unable to measure PASP.  She is struggling with persistent fatigue.  Suspect that this is likely related to deconditioning due to her decreased activity level since her hospitalization and sequela of pulmonary embolism.  Given the fact that she is on anticoagulation therapy, we will check CBC to rule out worsening anemia.  Also recommended that we check TSH to rule out thyroid etiology.  Encouraged her to try returning to her previous exercise regimen and increase activity as tolerated.  Patient Instructions  Continue Eliquis 5 mg Twice daily. Continue with bleeding precautions and if you fall and hit your head, please seek emergency care  Chest x ray today   Labs today - CBC and TSH   Follow up in 6 months with Dr. Chase Caller or sooner if needed

## 2022-04-29 NOTE — Assessment & Plan Note (Signed)
See above

## 2022-04-29 NOTE — Progress Notes (Signed)
@Patient  ID: Linda Lee, female    DOB: 01/25/1936, 86 y.o.   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 Last seen 02/11/2022.  She was admitted from 01/19/2022 to 01/22/2022 for unprovoked acute submassive pulmonary embolism and associated acute hypoxic respiratory failure.  Past medical history significant for Parkinson's disease, lumbar spinal stenosis, osteoarthritis, AAA without rupture, diastolic CHF, migraines, GERD, HLD.  TEST/EVENTS:  01/19/2022 CTA chest: multiple bilateral pulmonary arterial filling defcts. RV to LV ratio is 1:12. Interval increase in diameter of PA. 4.3 ascending thoracic aortic aneurysm, stable up to 10 years. No LAD. Minimal b/l dependent atelectasis. Interval wedge-shaped area of consolidation in the anteromedial aspect of the RLL with air bronchogram. Interval minimal irregular patchy density more superiorly and laterally in the RLL.  01/20/2022 echocardiogram: EF 60-65%; G1DD. RV size and function nl. Severely elevated PASP. Trivial MR. Moderate TR. AR mild. Mild dilatation of the ascending aorta.  04/01/2022 echocardiogram: EF 60-65%. LVH. RV size and function nl. Unable to measure PASP. Trivial MR. AR mild to moderate.   01/19/2022-01/22/2022: Hospitalization for acute respiratory failure related to unprovoked acute submassive PE with right heart strain on CTA. She had echocardiogram with normal right sided function and severely elevated PASP. She was treated with heparin drip and transitioned to PO Eliquis. Recommended lifelong as clot was unprovoked. She had RLL infiltrates on her imaging, suspected to be related to PE. She did receiver 1 dose of ceftriaxone and azithromycin; recommended outpatient imaging to ensure resolution.   02/11/2022: Ok Edwards with Lariyah Shetterly NP for hospital follow up. She reports  feeling better since being home. She still has some fatigue and occasional chest discomfort. She also gets winded more easily with activities than she prior to this. Her cough is hacking but has cleared up and she is no longer producing sputum. All of her symptoms are steadily improving though and she feels like she is getting some energy back. She would like to go back to her exercise class for people with Parkinson's and wants to know if this would be okay. She denies any bleeding/excessive bruising, hemoptysis, fevers, chills. She is taking her Eliquis twice daily. Plan to repeat echo in 3 months.  04/29/2022: Today - follow up Patient presents today for follow up.  She has been doing relatively well since I saw her last.  Feels like her breathing is stable.  She still struggling with daily fatigue symptoms.  Feels like she just does not have much energy.  She also admits that she has not been doing much as far as exercise/activity.  Prior to her hospitalization, she was going to a exercise class directed for people with Parkinson's.  She has not returned to this since.  She was working with physical therapy though and felt like this was helping with her strength and back pain.  She denies any bleeding/excessive bruising, hemoptysis, epistaxis, shortness of breath, calf pain/swelling.  She is taking her Eliquis twice daily.  Allergies  Allergen Reactions   Ciprofloxacin Other (See Comments)    Can not take due to hx of Cdiff per patient   Wasp Venom Other (See Comments)    dizziness   Fluconazole Rash    Immunization History  Administered Date(s) Administered   Fluad Quad(high Dose 65+) 04/20/2019   Influenza, High Dose Seasonal PF 05/05/2018   Influenza,inj,quad,  With Preservative 05/06/2019   PFIZER(Purple Top)SARS-COV-2 Vaccination 09/10/2019, 10/05/2019, 03/05/2020   Unspecified SARS-COV-2 Vaccination 09/10/2019, 10/05/2019    Past Medical History:  Diagnosis Date   Anginal pain (HCC)     Angio-edema    Arthritis    Atypical chest pain 07/06/2009   Qualifier: Diagnosis of  By: Clifton JamesMcAlhany, MD, Christopher     GERD (gastroesophageal reflux disease)    Hyperlipemia    Migraines    Toxic effect of venom of bees, unintentional 06/03/2017    Tobacco History: Social History   Tobacco Use  Smoking Status Former   Packs/day: 0.25   Years: 5.00   Total pack years: 1.25   Types: Cigarettes   Quit date: 08/15/1963   Years since quitting: 58.7  Smokeless Tobacco Never   Counseling given: Not Answered   Outpatient Medications Prior to Visit  Medication Sig Dispense Refill   APIXABAN (ELIQUIS) VTE STARTER PACK (10MG  AND 5MG ) Take as directed on package: start with two-5mg  tablets twice daily for 7 days. On day 8, switch to one-5mg  tablet twice daily. 1 each 0   atorvastatin (LIPITOR) 20 MG tablet Take 1 tablet (20 mg total) by mouth daily after supper. 30 tablet 0   carbidopa-levodopa (SINEMET IR) 25-100 MG tablet TAKE 1 AND 1/2 TABLETS BY MOUTH THREE TIMES DAILY 405 tablet 0   CRANBERRY PO Take 300 mg daily by mouth.     Cyanocobalamin (VITAMIN B-12 PO) Take 1,200 mcg daily by mouth.      EPINEPHrine 0.3 mg/0.3 mL IJ SOAJ injection Inject 0.3 mg into the muscle once as needed for up to 1 dose (anaphylaxis). 2 each 1   Ergocalciferol (VITAMIN D2) 2000 units TABS Take 2,000 Units daily by mouth.      furosemide (LASIX) 20 MG tablet Take 20 mg by mouth 2 (two) times a week. Take 1 tablet  M, F     KLOR-CON M10 10 MEQ tablet 10 mEq 2 (two) times a week.     polyethylene glycol (MIRALAX / GLYCOLAX) 17 g packet Take 17 g by mouth daily as needed for mild constipation. 14 each 0   propranolol ER (INDERAL LA) 80 MG 24 hr capsule TAKE 1 CAPSULE(80 MG) BY MOUTH DAILY (Patient taking differently: Take 80 mg by mouth daily.) 90 capsule 3   saccharomyces boulardii (FLORASTOR) 250 MG capsule Take 250 mg by mouth 3 (three) times a week.     omeprazole (PRILOSEC) 40 MG capsule Take 40 mg by  mouth continuous as needed (indigestion). (Patient not taking: Reported on 04/29/2022)     No facility-administered medications prior to visit.     Review of Systems:   Constitutional: No weight loss or gain, night sweats, fevers, chills +fatigue, lassitude  HEENT: No headaches, difficulty swallowing, tooth/dental problems, or sore throat. No sneezing, itching, ear ache, nasal congestion, or post nasal drip CV:  No chest pain, orthopnea, PND, swelling in lower extremities, anasarca, dizziness, palpitations, syncope Resp: No shortness of breath.  No cough.  No excess mucus or change in color of mucus. No hemoptysis. No wheezing.  No chest wall deformity GI:  No heartburn, indigestion, abdominal pain, nausea, vomiting, diarrhea, change in bowel habits, loss of appetite, bloody stools.  GU: No dysuria, change in color of urine, urgency or frequency.  No flank pain, no hematuria  Skin: No rash, lesions, ulcerations Neuro: No dizziness or lightheadedness.  Psych: No depression or anxiety. Mood stable.     Physical Exam:  BP 126/84 (BP Location:  Right Arm, Cuff Size: Normal)   Pulse (!) 53   Ht 5' 4.5" (1.638 m)   Wt 157 lb 9.6 oz (71.5 kg)   SpO2 98%   BMI 26.63 kg/m   GEN: Pleasant, interactive, well-appearing; in no acute distress. HEENT:  Normocephalic and atraumatic. PERRLA. Sclera white. Nasal turbinates pink, moist and patent bilaterally. No rhinorrhea present. Oropharynx pink and moist, without exudate or edema. No lesions, ulcerations, or postnasal drip.  NECK:  Supple w/ fair ROM. No JVD present.  CV: RRR, no m/r/g, no peripheral edema. Pulses intact, +2 bilaterally. No cyanosis, pallor or clubbing. PULMONARY:  Unlabored, regular breathing. Clear bilaterally A&P w/o wheezes/rales/rhonchi. No accessory muscle use. No dullness to percussion. GI: BS present and normoactive. Soft, non-tender to palpation. No organomegaly or masses detected.  MSK: No erythema, warmth or tenderness.  No deformities or joint swelling noted.  Neuro: A/Ox3. No focal deficits noted.   Skin: Warm, no lesions or rashe Psych: Normal affect and behavior. Judgement and thought content appropriate.     Lab Results:  CBC    Component Value Date/Time   WBC 4.7 01/21/2022 0334   RBC 3.49 (L) 01/21/2022 0334   HGB 10.7 (L) 01/21/2022 0334   HCT 34.5 (L) 01/21/2022 0334   PLT 125 (L) 01/21/2022 0334   MCV 98.9 01/21/2022 0334   MCH 30.7 01/21/2022 0334   MCHC 31.0 01/21/2022 0334   RDW 12.7 01/21/2022 0334   LYMPHSABS 0.8 01/20/2022 0750   MONOABS 0.3 01/20/2022 0750   EOSABS 0.1 01/20/2022 0750   BASOSABS 0.0 01/20/2022 0750    BMET    Component Value Date/Time   NA 142 01/21/2022 0334   K 4.2 01/21/2022 0334   CL 111 01/21/2022 0334   CO2 23 01/21/2022 0334   GLUCOSE 107 (H) 01/21/2022 0334   BUN 19 01/21/2022 0334   CREATININE 0.74 01/21/2022 0334   CALCIUM 8.3 (L) 01/21/2022 0334   GFRNONAA >60 01/21/2022 0334   GFRAA >60 08/11/2018 1039    BNP    Component Value Date/Time   BNP 142.6 (H) 01/22/2022 0418     Imaging:  ECHOCARDIOGRAM COMPLETE  Result Date: 04/01/2022    ECHOCARDIOGRAM REPORT   Patient Name:   AURA BIBBY Date of Exam: 04/01/2022 Medical Rec #:  [id number]          Height:       65.0 in Accession #:    [id number]         Weight:       157.8 lb Date of Birth:  Dec 20, 1935         BSA:          1.789 m Patient Age:    85 years           BP:           118/74 mmHg Patient Gender: F                  HR:           54 bpm. Exam Location:  [address] Procedure: 2D Echo, Cardiac Doppler and Color Doppler Indications:    I26.09 Pulmonary embolus  History:        Patient has prior history of Echocardiogram examinations, most                 recent 01/20/2022. Dilated ascending aorta, Pulmpnary embolus;  Risk Factors:Dyslipidemia.  Sonographer:    Samule Ohm RDCS Referring Phys: [id number] Ruby Cola Jaylon Boylen IMPRESSIONS  1. Left ventricular  ejection fraction, by estimation, is 60 to 65%. The left ventricle has normal function. The left ventricle has no regional wall motion abnormalities. There is mild left ventricular hypertrophy. Left ventricular diastolic parameters are indeterminate.  2. Right ventricular systolic function is normal. The right ventricular size is normal. Tricuspid regurgitation signal is inadequate for assessing PA pressure.  3. Left atrial size was mildly dilated.  4. The mitral valve is normal in structure. Trivial mitral valve regurgitation.  5. The aortic valve is tricuspid. Aortic valve regurgitation is mild to moderate. No aortic stenosis is present.  6. Aortic dilatation noted. There is dilatation of the ascending aorta, measuring 42 mm.  7. The inferior vena cava is normal in size with greater than 50% respiratory variability, suggesting right atrial pressure of 3 mmHg. FINDINGS  Left Ventricle: Left ventricular ejection fraction, by estimation, is 60 to 65%. The left ventricle has normal function. The left ventricle has no regional wall motion abnormalities. The left ventricular internal cavity size was normal in size. There is  mild left ventricular hypertrophy. Left ventricular diastolic parameters are indeterminate. Right Ventricle: The right ventricular size is normal. No increase in right ventricular wall thickness. Right ventricular systolic function is normal. Tricuspid regurgitation signal is inadequate for assessing PA pressure. Left Atrium: Left atrial size was mildly dilated. Right Atrium: Right atrial size was normal in size. Pericardium: There is no evidence of pericardial effusion. Mitral Valve: The mitral valve is normal in structure. Trivial mitral valve regurgitation. Tricuspid Valve: The tricuspid valve is normal in structure. Tricuspid valve regurgitation is not demonstrated. Aortic Valve: The aortic valve is tricuspid. Aortic valve regurgitation is mild to moderate. Aortic regurgitation PHT measures 770  msec. No aortic stenosis is present. Pulmonic Valve: The pulmonic valve was not well visualized. Pulmonic valve regurgitation is trivial. Aorta: Aortic dilatation noted. There is dilatation of the ascending aorta, measuring 42 mm. Venous: The inferior vena cava is normal in size with greater than 50% respiratory variability, suggesting right atrial pressure of 3 mmHg. IAS/Shunts: The interatrial septum was not well visualized.  LEFT VENTRICLE PLAX 2D LVIDd:         4.60 cm   Diastology LVIDs:         2.90 cm   LV e' medial:    7.07 cm/s LV PW:         1.20 cm   LV E/e' medial:  11.6 LV IVS:        1.20 cm   LV e' lateral:   8.16 cm/s LVOT diam:     1.90 cm   LV E/e' lateral: 10.1 LV SV:         81 LV SV Index:   45 LVOT Area:     2.84 cm  RIGHT VENTRICLE             IVC RV S prime:     18.80 cm/s  IVC diam: 1.50 cm TAPSE (M-mode): 2.2 cm LEFT ATRIUM             Index        RIGHT ATRIUM           Index LA diam:        3.70 cm 2.07 cm/m   RA Pressure: 3.00 mmHg LA Vol (A2C):   58.9 ml 32.93 ml/m  RA Area:     14.20  cm LA Vol (A4C):   64.1 ml 35.84 ml/m  RA Volume:   31.70 ml  17.72 ml/m LA Biplane Vol: 63.2 ml 35.33 ml/m  AORTIC VALVE LVOT Vmax:   111.00 cm/s LVOT Vmean:  77.900 cm/s LVOT VTI:    0.285 m AI PHT:      770 msec  AORTA Ao Root diam: 3.80 cm Ao Asc diam:  4.20 cm MITRAL VALVE               TRICUSPID VALVE MV Area (PHT): 3.63 cm    Estimated RAP:  3.00 mmHg MV Decel Time: 209 msec MV E velocity: 82.30 cm/s  SHUNTS MV A velocity: 72.30 cm/s  Systemic VTI:  0.29 m MV E/A ratio:  1.14        Systemic Diam: 1.90 cm Epifanio Lesches MD Electronically signed by Epifanio Lesches MD Signature Date/Time: 04/01/2022/2:52:27 PM    Final           No data to display          No results found for: "NITRICOXIDE"      Assessment & Plan:   Acute pulmonary embolism (HCC) Unprovoked acute submassive PE hospitalized 01/19/2022 to 01/22/2022. Recommendations for lifelong anticoagulation, but  at minimum 3 months of therapy. She is tolerating it well.  She was agreeable to continuing therapy today.  Reviewed risks again; verbalized understanding.  Recent echo was overall unremarkable; unable to measure PASP.  She is struggling with persistent fatigue.  Suspect that this is likely related to deconditioning due to her decreased activity level since her hospitalization and sequela of pulmonary embolism.  Given the fact that she is on anticoagulation therapy, we will check CBC to rule out worsening anemia.  Also recommended that we check TSH to rule out thyroid etiology.  Encouraged her to try returning to her previous exercise regimen and increase activity as tolerated.  Patient Instructions  Continue Eliquis 5 mg Twice daily. Continue with bleeding precautions and if you fall and hit your head, please seek emergency care  Chest x ray today   Labs today - CBC and TSH   Follow up in 6 months with Dr. Marchelle Gearing or sooner if needed     Lung infiltrate Previously found to have right lower lobe infiltrate, suspected to be related to pulmonary embolism.  She had interval improvement with mild residual opacities on CXR from 02/11/2022.  We will recheck today to ensure this has resolved.  Fatigue See above.     I spent 32 minutes of dedicated to the care of this patient on the date of this encounter to include pre-visit review of records, face-to-face time with the patient discussing conditions above, post visit ordering of testing, clinical documentation with the electronic health record, making appropriate referrals as documented, and communicating necessary findings to members of the patients care team.  Noemi Chapel, NP 04/29/2022  Pt aware and understands NP's role.

## 2022-05-01 NOTE — Progress Notes (Signed)
Please notify patient that her CBC and TSH were overall nl. Platelets are a little low but better than 3 months ago. Nothing to be concerned about right now. We will just monitor them. Thanks.

## 2022-05-15 ENCOUNTER — Ambulatory Visit (INDEPENDENT_AMBULATORY_CARE_PROVIDER_SITE_OTHER): Payer: Medicare PPO

## 2022-05-15 DIAGNOSIS — T63441D Toxic effect of venom of bees, accidental (unintentional), subsequent encounter: Secondary | ICD-10-CM

## 2022-05-22 ENCOUNTER — Telehealth: Payer: Self-pay | Admitting: Pharmacy Technician

## 2022-05-22 NOTE — Telephone Encounter (Signed)
[facility] referral has been closed due to serveral attempts to receive clinicals with no success.

## 2022-05-31 ENCOUNTER — Other Ambulatory Visit: Payer: Self-pay

## 2022-05-31 ENCOUNTER — Encounter (HOSPITAL_BASED_OUTPATIENT_CLINIC_OR_DEPARTMENT_OTHER): Payer: Self-pay | Admitting: *Deleted

## 2022-05-31 ENCOUNTER — Emergency Department (HOSPITAL_BASED_OUTPATIENT_CLINIC_OR_DEPARTMENT_OTHER): Payer: Medicare PPO

## 2022-05-31 ENCOUNTER — Emergency Department (HOSPITAL_BASED_OUTPATIENT_CLINIC_OR_DEPARTMENT_OTHER)
Admission: EM | Admit: 2022-05-31 | Discharge: 2022-05-31 | Disposition: A | Discharge status: Home / Self Care | Payer: Medicare PPO | Attending: Emergency Medicine | Admitting: Emergency Medicine

## 2022-05-31 DIAGNOSIS — W231XXA Caught, crushed, jammed, or pinched between stationary objects, initial encounter: Secondary | ICD-10-CM | POA: Insufficient documentation

## 2022-05-31 DIAGNOSIS — M79671 Pain in right foot: Secondary | ICD-10-CM | POA: Diagnosis not present

## 2022-05-31 DIAGNOSIS — Z7901 Long term (current) use of anticoagulants: Secondary | ICD-10-CM | POA: Insufficient documentation

## 2022-05-31 DIAGNOSIS — M79674 Pain in right toe(s): Secondary | ICD-10-CM | POA: Insufficient documentation

## 2022-05-31 NOTE — ED Provider Notes (Signed)
[facility] EMERGENCY DEPARTMENT Provider Note   CSN: [id number] Arrival date & time: 05/31/22  1213     History  Chief Complaint  Patient presents with   Toe Pain     3rd toe on the right foot    Linda Lee is a 86 y.o. female.  Patient presents to the emergency department complaining of right third and fourth toe pain.  Patient states that on Wednesday she jammed her toes against her walking cane.  She denies falling and denies hitting her head.  She does endorse taking Eliquis.  The patient states that she has continued to have some pain in those toes and came for evaluation.  Past medical history significant for arthritis, chest pain, hyperlipidemia  HPI     Home Medications Prior to Admission medications   Medication Sig Start Date End Date Taking? Authorizing Provider  APIXABAN (ELIQUIS) VTE STARTER PACK (10MG  AND 5MG ) Take as directed on package: start with two-5mg  tablets twice daily for 7 days. On day 8, switch to one-5mg  tablet twice daily. 01/22/22   Georgette Shell, MD  atorvastatin (LIPITOR) 20 MG tablet Take 1 tablet (20 mg total) by mouth daily after supper. 08/11/18   Mesner, Corene Cornea, MD  carbidopa-levodopa (SINEMET IR) 25-100 MG tablet TAKE 1 AND 1/2 TABLETS BY MOUTH THREE TIMES DAILY 03/19/22   Tat, Eustace Quail, DO  CRANBERRY PO Take 300 mg daily by mouth.    [provider]  Cyanocobalamin (VITAMIN B-12 PO) Take 1,200 mcg daily by mouth.     [provider]  EPINEPHrine 0.3 mg/0.3 mL IJ SOAJ injection Inject 0.3 mg into the muscle once as needed for up to 1 dose (anaphylaxis). 02/07/21   Althea Charon, FNP  Ergocalciferol (VITAMIN D2) 2000 units TABS Take 2,000 Units daily by mouth.     [provider]  furosemide (LASIX) 20 MG tablet Take 20 mg by mouth 2 (two) times a week. Take 1 tablet  M, F    [provider]  KLOR-CON M10 10 MEQ tablet 10 mEq 2 (two) times a week. 05/11/19   [provider]   omeprazole (PRILOSEC) 40 MG capsule Take 40 mg by mouth continuous as needed (indigestion). Patient not taking: Reported on 04/29/2022 06/15/18   [provider]  polyethylene glycol (MIRALAX / GLYCOLAX) 17 g packet Take 17 g by mouth daily as needed for mild constipation. 01/22/22   Georgette Shell, MD  propranolol ER (INDERAL LA) 80 MG 24 hr capsule TAKE 1 CAPSULE(80 MG) BY MOUTH DAILY Patient taking differently: Take 80 mg by mouth daily. 05/15/21   Adrian Prows, MD  saccharomyces boulardii (FLORASTOR) 250 MG capsule Take 250 mg by mouth 3 (three) times a week.    [provider]      Allergies    Ciprofloxacin, Wasp venom, and Fluconazole    Review of Systems   Review of Systems  Musculoskeletal:  Positive for arthralgias.    Physical Exam Updated Vital Signs BP 123/66 (BP Location: Left Arm)   Pulse (!) 57   Temp 97.7 F (36.5 C) (Oral)   Resp 18   Ht 5' 4.5" (1.638 m)   Wt 71.2 kg   SpO2 100%   BMI 26.53 kg/m  Physical Exam HENT:     Head: Normocephalic and atraumatic.  Eyes:     Pupils: Pupils are equal, round, and reactive to light.  Cardiovascular:     Rate and Rhythm: Normal rate.  Pulmonary:  Effort: Pulmonary effort is normal. No respiratory distress.  Musculoskeletal:        General: Tenderness (Mild tenderness to the third and fourth toe of the right foot) present. No deformity or signs of injury.     Cervical back: Normal range of motion.  Skin:    General: Skin is dry.     Capillary Refill: Capillary refill takes less than 2 seconds.  Neurological:     Mental Status: She is alert.  Psychiatric:        Speech: Speech normal.        Behavior: Behavior normal.     ED Results / Procedures / Treatments   Labs (all labs ordered are listed, but only abnormal results are displayed) Labs Reviewed - No data to display  EKG None  Radiology DG Foot Complete Right  Result Date: 05/31/2022 CLINICAL DATA:  Foot pain, third toe  pain EXAM: RIGHT FOOT COMPLETE - 3+ VIEW COMPARISON:  None Available. FINDINGS: There is no evidence of fracture or dislocation. There is no evidence of arthropathy or other focal bone abnormality. Soft tissues are unremarkable. IMPRESSION: Negative. Electronically Signed   By: Elige Ko M.D.   On: 05/31/2022 14:29    Procedures .Ortho Injury Treatment  Date/Time: 05/31/2022 4:45 PM  Performed by: Darrick Grinder, PA-C Authorized by: Darrick Grinder, PA-C   Consent:    Consent obtained:  Verbal   Consent given by:  Patient   Risks discussed:  Restricted joint movement, stiffness and vascular damage   Alternatives discussed:  No treatmentInjury location: toe Location details: right third toe Injury type: soft tissue Pre-procedure neurovascular assessment: neurovascularly intact Immobilization: tape Splint Applied by: ED Tech Post-procedure neurovascular assessment: post-procedure neurovascularly intact       Medications Ordered in ED Medications - No data to display  ED Course/ Medical Decision Making/ A&P                           Medical Decision Making Amount and/or Complexity of Data Reviewed Radiology: ordered.   Patient presents to the emergency department complaining of toe pain on the right foot.  Differential diagnosis includes but is not limited to fracture, dislocation, soft tissue injury, and others  I reviewed the patient's past medical history including notes from hospitalizations showing admission for acute respiratory failure in June of this year  There is no indication at this time for lab work  I ordered and interpreted imaging including plain films of the right foot.  No fracture or dislocation was noted on imaging.  I agree with the radiologist findings.  I had the patient placed in buddy tape for soft tissue injury.  See procedure note above  There was no dislocation or fracture on imaging.  Pain seems to be soft tissue in nature.  For comfort  patient was placed in buddy tape.  She may take this off at any time if she finds it more comfortable to do so.  There is no indication for admission or follow-up at this time.  Plan to discharge patient home.        Final Clinical Impression(s) / ED Diagnoses Final diagnoses:  Toe pain, right    Rx / DC Orders ED Discharge Orders     None         Pamala Duffel 05/31/22 1707    Elayne Snare K, DO 05/31/22 2304

## 2022-05-31 NOTE — Discharge Instructions (Addendum)
You were seen today for pain in the right foot.  Your imaging was reassuring for no signs of fracture or dislocation.  Toes were placed in tape today for comfort.  If you find it more beneficial to remove the tape you may do so.  There is no need for follow-up for this type of injury.  It should heal on its own over the next few weeks.  Follow-up with your primary care provider if you continue to have issues with the toes in the future

## 2022-05-31 NOTE — ED Triage Notes (Signed)
Patient reports she hit her right foot with her cane and has pain since.  Patient believes it is the 3rd toe on her right foot.  Patient did not take any meds this morning.  She reports this occurred a few days ago.  Patient is alert.  She is able to ambulate.

## 2022-05-31 NOTE — ED Provider Triage Note (Signed)
Emergency Medicine Provider Triage Evaluation Note  Linda Lee , a 86 y.o. female  was evaluated in triage.  Pt complains of 3rd and 4th toe pain of the right foot. Patient states she kicked her cane on Wednesday. She denies falling or hitting her head. She does take Eliquis.   Review of Systems  Positive: As above Negative: As above  Physical Exam  BP 123/66 (BP Location: Left Arm)   Pulse (!) 57   Temp 97.7 F (36.5 C) (Oral)   Resp 18   Ht 5' 4.5" (1.638 m)   Wt 71.2 kg   SpO2 100%   BMI 26.53 kg/m  Gen:   Awake, no distress   Resp:  Normal effort  MSK:   Moves extremities without difficulty  Other:    Medical Decision Making  Medically screening exam initiated at 3:35 PM.  Appropriate orders placed.  Linda Lee was informed that the remainder of the evaluation will be completed by another provider, this initial triage assessment does not replace that evaluation, and the importance of remaining in the ED until their evaluation is complete.     Linda Peng, PA-C 05/31/22 1537

## 2022-05-31 NOTE — ED Notes (Signed)
Patient states that she hit her middle toe on her cane 2 days ago. States that it is tender to touch swollen and red

## 2022-06-04 DIAGNOSIS — R7989 Other specified abnormal findings of blood chemistry: Secondary | ICD-10-CM | POA: Diagnosis not present

## 2022-06-04 DIAGNOSIS — E785 Hyperlipidemia, unspecified: Secondary | ICD-10-CM | POA: Diagnosis not present

## 2022-06-04 DIAGNOSIS — M8580 Other specified disorders of bone density and structure, unspecified site: Secondary | ICD-10-CM | POA: Diagnosis not present

## 2022-06-04 DIAGNOSIS — I10 Essential (primary) hypertension: Secondary | ICD-10-CM | POA: Diagnosis not present

## 2022-06-07 DIAGNOSIS — D1801 Hemangioma of skin and subcutaneous tissue: Secondary | ICD-10-CM | POA: Diagnosis not present

## 2022-06-07 DIAGNOSIS — Z85828 Personal history of other malignant neoplasm of skin: Secondary | ICD-10-CM | POA: Diagnosis not present

## 2022-06-07 DIAGNOSIS — L814 Other melanin hyperpigmentation: Secondary | ICD-10-CM | POA: Diagnosis not present

## 2022-06-07 DIAGNOSIS — L821 Other seborrheic keratosis: Secondary | ICD-10-CM | POA: Diagnosis not present

## 2022-06-11 DIAGNOSIS — G629 Polyneuropathy, unspecified: Secondary | ICD-10-CM | POA: Diagnosis not present

## 2022-06-11 DIAGNOSIS — M5030 Other cervical disc degeneration, unspecified cervical region: Secondary | ICD-10-CM | POA: Diagnosis not present

## 2022-06-11 DIAGNOSIS — R82998 Other abnormal findings in urine: Secondary | ICD-10-CM | POA: Diagnosis not present

## 2022-06-11 DIAGNOSIS — Z1339 Encounter for screening examination for other mental health and behavioral disorders: Secondary | ICD-10-CM | POA: Diagnosis not present

## 2022-06-11 DIAGNOSIS — Z0000 Encounter for general adult medical examination without abnormal findings: Secondary | ICD-10-CM | POA: Diagnosis not present

## 2022-06-11 DIAGNOSIS — Z1331 Encounter for screening for depression: Secondary | ICD-10-CM | POA: Diagnosis not present

## 2022-06-11 DIAGNOSIS — I340 Nonrheumatic mitral (valve) insufficiency: Secondary | ICD-10-CM | POA: Diagnosis not present

## 2022-06-11 DIAGNOSIS — G20A1 Parkinson's disease without dyskinesia, without mention of fluctuations: Secondary | ICD-10-CM | POA: Diagnosis not present

## 2022-06-11 DIAGNOSIS — I351 Nonrheumatic aortic (valve) insufficiency: Secondary | ICD-10-CM | POA: Diagnosis not present

## 2022-06-11 DIAGNOSIS — D259 Leiomyoma of uterus, unspecified: Secondary | ICD-10-CM | POA: Diagnosis not present

## 2022-06-11 DIAGNOSIS — I77810 Thoracic aortic ectasia: Secondary | ICD-10-CM | POA: Diagnosis not present

## 2022-06-11 DIAGNOSIS — M179 Osteoarthritis of knee, unspecified: Secondary | ICD-10-CM | POA: Diagnosis not present

## 2022-07-10 ENCOUNTER — Ambulatory Visit (INDEPENDENT_AMBULATORY_CARE_PROVIDER_SITE_OTHER): Payer: Medicare PPO

## 2022-07-10 DIAGNOSIS — T63441D Toxic effect of venom of bees, accidental (unintentional), subsequent encounter: Secondary | ICD-10-CM | POA: Diagnosis not present

## 2022-08-14 NOTE — Progress Notes (Unsigned)
Assessment/Plan:   1.  Parkinsons Disease  -Continue carbidopa/levodopa 25/100, 1.5 tablets 3 times per day  -PT at [facility] - referral sent   2.  Severe spinal canal stenosis at L3-L4  -Has seen Dr. Vertell Limber previously and knows that he has now retired.  3.  History of expressive aphasia, likely representing complicated migraine   -On Lipitor.  Aspirin was discontinued when she was placed on apixaban for a pulmonary embolus.  Extensive negative work-up.  4.  Sialorrhea  -This is commonly associated with PD.  We talked about treatments.  The patient is not a candidate for oral anticholinergic therapy because of increased risk of confusion and falls.  We discussed Botox (type A and B) and 1% atropine drops.  We discusssed that candy like lemon drops can help by stimulating mm of the oropharynx to induce swallowing.  She is not interested right now  5.    Pulmonary embolus  -On Eliquis.   Subjective:   Linda Lee was seen today in follow up for Parkinsons disease.  My previous records were reviewed prior to todays visit as well as outside records available to me.  Patient has been to physical therapy since our last visit.  She has not had falls.  She remains on Eliquis for history of PE.  Current prescribed movement disorder medications: Carbidopa/levodopa 25/100, 1.5 tablets 3 times per day    ALLERGIES:   Allergies  Allergen Reactions   Ciprofloxacin Other (See Comments)    Can not take due to hx of Cdiff per patient   Wasp Venom Other (See Comments)    dizziness   Fluconazole Rash    CURRENT MEDICATIONS:  Outpatient Encounter Medications as of 08/15/2022  Medication Sig   APIXABAN (ELIQUIS) VTE STARTER PACK (10MG  AND 5MG ) Take as directed on package: start with two-5mg  tablets twice daily for 7 days. On day 8, switch to one-5mg  tablet twice daily.   atorvastatin (LIPITOR) 20 MG tablet Take 1 tablet (20 mg total) by mouth daily after supper.    carbidopa-levodopa (SINEMET IR) 25-100 MG tablet TAKE 1 AND 1/2 TABLETS BY MOUTH THREE TIMES DAILY   CRANBERRY PO Take 300 mg daily by mouth.   Cyanocobalamin (VITAMIN B-12 PO) Take 1,200 mcg daily by mouth.    EPINEPHrine 0.3 mg/0.3 mL IJ SOAJ injection Inject 0.3 mg into the muscle once as needed for up to 1 dose (anaphylaxis).   Ergocalciferol (VITAMIN D2) 2000 units TABS Take 2,000 Units daily by mouth.    furosemide (LASIX) 20 MG tablet Take 20 mg by mouth 2 (two) times a week. Take 1 tablet  M, F   KLOR-CON M10 10 MEQ tablet 10 mEq 2 (two) times a week.   omeprazole (PRILOSEC) 40 MG capsule Take 40 mg by mouth continuous as needed (indigestion). (Patient not taking: Reported on 04/29/2022)   polyethylene glycol (MIRALAX / GLYCOLAX) 17 g packet Take 17 g by mouth daily as needed for mild constipation.   propranolol ER (INDERAL LA) 80 MG 24 hr capsule TAKE 1 CAPSULE(80 MG) BY MOUTH DAILY (Patient taking differently: Take 80 mg by mouth daily.)   saccharomyces boulardii (FLORASTOR) 250 MG capsule Take 250 mg by mouth 3 (three) times a week.   No facility-administered encounter medications on file as of 08/15/2022.    Objective:   PHYSICAL EXAMINATION:    VITALS:   There were no vitals filed for this visit.     GEN:  The patient appears stated  age and is in NAD. HEENT:  Normocephalic, atraumatic.  The mucous membranes are moist. The superficial temporal arteries are without ropiness or tenderness. CV:  RRR Lungs:  CTAB Neck/HEME:  There are no carotid bruits bilaterally.  Neurological examination:  Orientation: The patient is alert and oriented x3. Cranial nerves: There is good facial symmetry without significant facial hypomimia. The speech is fluent and clear. Soft palate rises symmetrically and there is no tongue deviation. Hearing is intact to conversational tone. Sensation: Sensation is intact to light touch throughout.  No extinction with double simultaneous  stimulation. Motor: Strength is 5/5 in the upper and lower extremities.  There is no pronator drift.  Movement examination: Tone: There is nl tone in the UE/LE Abnormal movements: tremor in the RUE/RLE that fairly rare and intermittent Coordination:  There is no decremation today, with any form of RAMS, including alternating supination and pronation of the forearm, hand opening and closing, finger taps, heel taps and toe taps. Gait and Station: The patient has no difficulty arising out of a deep-seated chair without the use of the hands.  She is just slightly flexed at the waist.  She is dragging her feet some.  I have reviewed and interpreted the following labs independently    Chemistry      Component Value Date/Time   NA 142 01/21/2022 0334   K 4.2 01/21/2022 0334   CL 111 01/21/2022 0334   CO2 23 01/21/2022 0334   BUN 19 01/21/2022 0334   CREATININE 0.74 01/21/2022 0334      Component Value Date/Time   CALCIUM 8.3 (L) 01/21/2022 0334   ALKPHOS 77 01/21/2022 0334   AST 8 (L) 01/21/2022 0334   ALT 5 01/21/2022 0334   BILITOT 0.6 01/21/2022 0334       Lab Results  Component Value Date   WBC 5.1 04/29/2022   HGB 12.7 04/29/2022   HCT 38.0 04/29/2022   MCV 94.2 04/29/2022   PLT 139.0 (L) 04/29/2022      Total time spent on today's visit was *** minutes, including both face-to-face time and nonface-to-face time.  Time included that spent on review of records (prior notes available to me/labs/imaging if pertinent), discussing treatment and goals, answering patient's questions and coordinating care.  Cc:  Crist Infante, MD

## 2022-08-15 ENCOUNTER — Ambulatory Visit: Payer: Medicare PPO | Admitting: Neurology

## 2022-08-15 ENCOUNTER — Encounter: Payer: Self-pay | Admitting: Neurology

## 2022-08-15 ENCOUNTER — Other Ambulatory Visit: Payer: Self-pay

## 2022-08-15 VITALS — BP 116/72 | HR 63 | Ht 65.0 in | Wt 153.0 lb

## 2022-08-15 DIAGNOSIS — G20A1 Parkinson's disease without dyskinesia, without mention of fluctuations: Secondary | ICD-10-CM

## 2022-08-15 DIAGNOSIS — R200 Anesthesia of skin: Secondary | ICD-10-CM

## 2022-08-15 DIAGNOSIS — M4800 Spinal stenosis, site unspecified: Secondary | ICD-10-CM

## 2022-08-15 MED ORDER — CARBIDOPA-LEVODOPA 25-100 MG PO TABS: ORAL_TABLET | ORAL | 1 refills | Status: DC

## 2022-08-15 MED ORDER — CARBIDOPA-LEVODOPA ER 50-200 MG PO TBCR: 1.0000 | EXTENDED_RELEASE_TABLET | Freq: Every day | ORAL | 1 refills | Status: DC

## 2022-08-15 NOTE — Patient Instructions (Signed)
ADD carbidopa/levodopa 50/200 CR at bedtime I will send a referral to Dr. Rae Lips and Online Resources for Power over Parkinson's Group  January 2024   [place] over Parkinson's Group:    Power Over Parkinson's Patient Education Group will be Wednesday, January 10th-*Hybrid meting*- in person at [facility] location and via [facility], 2:00-3:00 pm.   Starting in November, Power over [employer] and Care Partner Groups will meet together, with plans for separate break out session for caregivers (*this will be evolving over the next few months) Upcoming Power over Parkinson's Meetings/Care Partner Support:  2nd Wednesdays of the month at 2 pm:   January 10th, February 14th [place] at [email or web address] if interested in participating in this group    [place]! Moves Dynegy Instructor-Led Classes offering at [employer]!  TUESDAYS and Wednesdays 1-2 pm.   Contact Vonna Kotyk at  [email or web address]  or [phone] (Tuesday classes are modified for chair and standing only) Drumming for Parkinson's will be held on 2nd and 4th Mondays at 11:00 am.   Located at the [place] ([place]  Contact Doylene Canning at [email or web address] or [place] Class, Mondays at 11 am.  Call [phone] for details Let's Try Pickleball-$25 for 6 weeks of Pickleball in [place].  Contact Corwin Levins for more details and for dates.  [email or web address] SAVE THE DATE and REGISTER:  [place] Chapter of Parkinson's Foundation:  Parkinson's Symposium.  Conversations about Parkinson's.  Saturday, October 05, 2022, 9:00 am-2:00 pm.  Energy, *In person or online via [place]*.  Register at [email or web address] or call Beverlee Nims at [phone].   [place]:  [email or web address]  PD Health at Home continues:  Mindfulness Mondays, Wellness Wednesdays, Fitness Fridays   Upcoming Education:    Environmental health practitioner your Voice:  Air cabin crew. Wednesday, January 3rd,  1-2 pm  Managing Weight Loss & Retaining Muscle Mass.  Wednesday, Jan 10th, 1-2 pm Changes in Speech and Voice.  Wednesday, January 17th, 1-2 pm Register for virtual education and Patent attorney (webinars) at [email or web address].pl Please check out their website to sign up for emails and see their full online offerings      [place]:  [email or web address]   Third Thursday Webinars:  On the third Thursday of every month at 12 p.m. ET, join our free live webinars to learn about various aspects of living with Parkinson's disease and our work to speed medical breakthroughs.  Upcoming Webinar:  New Year, New Moves!  Explore Exercise for Life with Parkinson's.  Thursday, January 18th at 12 noon. Check out additional information on their website to see their full online offerings    [facility]:  [email or web address]  Upcoming Webinar:   Physical Therapy and Parkinson's.  Thursday, January 11th, 2 pm  Webinar Series:  Living with Parkinson's Meetup.   Third Thursdays each month, 3 pm  Care Partner Monthly Meetup.  With Robin Searing Phinney.  First Tuesday of each month, 2 pm  Check out additional information to Live Well Today on their website    Parkinson and Movement Disorders (PMD) Alliance:  www.[email or web address]  [employer] Online:  Online Education Events  Sign up for emails, which are sent weekly to give you updates on programming and online offerings    Parkinson's Association of the [place]:  www.[email or web address]  Information on online support groups, education events, and online exercises including Yoga, Parkinson's exercises and more-LOTS of  information on links to PD resources and online events  Virtual Support Group through Parkinson's Association of the [place]; next one is scheduled for Wednesday, Feb 7th  MOVEMENT AND EXERCISE OPPORTUNITIES  PWR! Moves Classes at [place].  Wednesdays 10 and 11 am.   Contact Amy Marriott, PT [email or web address] if interested.  NEW PWR! Moves Class offerings at [employer].  *TUESDAYS* and Wednesdays 1-2 pm.    Contact Vonna Kotyk at  [email or web address]    Parkinson's Wellness Recovery (PWR! Moves)  www.[email or web address]  Info on the PWR! Virtual Experience:  You will have access to our expertise?through self-assessment, guided plans that start with the PD-specific fundamentals, educational content, tips, Q&A with an expert, and a growing Art therapist of PD-specific pre-recorded and live exercise classes of varying types and intensity - both physical and cognitive! If that is not enough, we offer 1:1 wellness consultations (in-person or virtual) to personalize your PWR! Research scientist (medical).   [place] Fridays:   As part of the PD Health @ Home program, this free video series focuses each week on one aspect of fitness designed to support people living with Parkinson's.? These weekly videos highlight the [place] fitness guidelines for people with Parkinson's disease.  ModemGamers.[email or web address]   Dance for PD website is offering free, live-stream classes throughout the week, as well as links to [employer] of classes:  [email or web address]  Virtual dance and Pilates for Parkinson's classes: Click on the Community Tab> Parkinson's Movement Initiative Tab.  To register for classes and for more information, visit [email or web address] and click the "community" tab.   [employer] Parkinson's Cycling Classes   [employer]:  Thursdays @ Noon-Live classes at [employer] ([employer] at [email or web address]?or [phone])  [employer]: Virtual Classes Mondays and Thursdays Jeanette Caprice classes Tuesday, Wednesday and Thursday (contact [place] at [email or web address] ?or [phone])  [place]  Varied levels of classes are offered Tuesdays and Thursdays at [employer].   Stretching with Verdis Frederickson weekly class is also offered for people with Parkinson's  To observe a class or for more information, call [phone] or email Hezzie Bump at [email or web address]   ADDITIONAL SUPPORT AND RESOURCES  [employer]:Online Caregiver Education Opportunities:  www.well-springsolutions.org/caregiver-education/caregiver-support-group.  You may also contact Vickki Muff at [email or web address] or [phone].     Well-Spring Navigator:  Just1Navigator program, a?free service to help individuals and families through the journey of determining care for older adults.  The "Navigator" is a [phone], Education officer, museum, who will speak with a prospective client and/or loved ones to provide an assessment of the situation and a set of recommendations for a personalized care plan -- all free of charge, and whether?Well[employer]Spring [employer] offers the needed service or not. If the need is not a service we provide, we are well-connected with reputable programs in town that we can refer you to.  www.well-springsolutions.org or to speak with the Navigator, call [phone].

## 2022-09-04 ENCOUNTER — Ambulatory Visit (INDEPENDENT_AMBULATORY_CARE_PROVIDER_SITE_OTHER): Payer: Medicare PPO

## 2022-09-04 DIAGNOSIS — T63441D Toxic effect of venom of bees, accidental (unintentional), subsequent encounter: Secondary | ICD-10-CM | POA: Diagnosis not present

## 2022-09-06 DIAGNOSIS — M48062 Spinal stenosis, lumbar region with neurogenic claudication: Secondary | ICD-10-CM | POA: Diagnosis not present

## 2022-09-06 DIAGNOSIS — M47816 Spondylosis without myelopathy or radiculopathy, lumbar region: Secondary | ICD-10-CM | POA: Diagnosis not present

## 2022-09-18 DIAGNOSIS — M48061 Spinal stenosis, lumbar region without neurogenic claudication: Secondary | ICD-10-CM | POA: Diagnosis not present

## 2022-09-18 DIAGNOSIS — M47816 Spondylosis without myelopathy or radiculopathy, lumbar region: Secondary | ICD-10-CM | POA: Diagnosis not present

## 2022-09-27 DIAGNOSIS — M48062 Spinal stenosis, lumbar region with neurogenic claudication: Secondary | ICD-10-CM | POA: Diagnosis not present

## 2022-10-07 ENCOUNTER — Ambulatory Visit: Payer: Medicare PPO | Admitting: Cardiology

## 2022-10-14 ENCOUNTER — Ambulatory Visit: Payer: Medicare PPO | Admitting: Cardiology

## 2022-10-14 ENCOUNTER — Encounter: Payer: Self-pay | Admitting: Cardiology

## 2022-10-14 VITALS — BP 110/63 | HR 73 | Resp 16 | Ht 65.0 in | Wt 166.0 lb

## 2022-10-14 DIAGNOSIS — I7121 Aneurysm of the ascending aorta, without rupture: Secondary | ICD-10-CM

## 2022-10-14 DIAGNOSIS — E7800 Pure hypercholesterolemia, unspecified: Secondary | ICD-10-CM | POA: Diagnosis not present

## 2022-10-14 DIAGNOSIS — Z86711 Personal history of pulmonary embolism: Secondary | ICD-10-CM

## 2022-10-14 NOTE — Progress Notes (Signed)
Primary Physician/Referring:  Crist Infante, MD  Patient ID: Zachery Conch, female    DOB: January 16, 1936, 87 y.o.   MRN: [id number]  Chief Complaint  Patient presents with   Thoracic aortic aneurysm without rupture   Hyperlipidemia   Follow-up    1 year   HPI:    EILIDH DIPIERRO  is a 87 y.o.  female patient with Parkinson's disease, hyperlipidemia, 4.5 cm ascending thoracicaortic aneurysm, severe lumbar spinal canal stenosis complex migraines with episodes of trouble talking and difficulty expressing herself and headache afterwards. She is followed by Dr. Carles Collet. She was admitted from 01/19/2022 to 01/22/2022 for unprovoked acute submassive pulmonary embolism and associated acute hypoxic respiratory failure.   Patient is overall doing well since last seen by [place] 1 year ago for aortic aneurysm follow up.  Fortunately states that she is doing well and except for generally feeling tired, has no specific complaints.  She has resumed all her activities well.  No chest pain, no orthopnea, no leg edema.  Tolerating anticoagulation.  Past Medical History:  Diagnosis Date   Anginal pain ([place])    Angio-edema    Arthritis    Atypical chest pain 07/06/2009   Qualifier: Diagnosis of  By: Angelena Form, MD, Christopher     GERD (gastroesophageal reflux disease)    Hyperlipemia    Migraines    Toxic effect of venom of bees, unintentional 06/03/2017    Social History   Tobacco Use   Smoking status: Former    Packs/day: 0.25    Years: 5.00    Total pack years: 1.25    Types: Cigarettes    Quit date: 08/15/1963    Years since quitting: 59.2   Smokeless tobacco: Never  Substance Use Topics   Alcohol use: Yes    Alcohol/week: 1.0 standard drink of alcohol    Types: 1 Glasses of wine per week    Comment: q hs    ROS  Review of Systems  Cardiovascular:  Positive for dyspnea on exertion (chronic, stable). Negative for chest pain, claudication, leg swelling, orthopnea and palpitations.   Neurological:  Positive for tremors (Parkinsons). Negative for focal weakness and headaches.   Objective  Blood pressure 110/63, pulse 73, resp. rate 16, height '5\' 5"'$  (1.651 m), weight 166 lb (75.3 kg), SpO2 98 %.     10/14/2022   10:54 AM 08/15/2022    2:48 PM 05/31/2022    1:54 PM  Vitals with BMI  Height '5\' 5"'$  '5\' 5"'$  5' 4.5"  Weight 166 lbs 153 lbs 157 lbs  BMI 27.62 [id number]  Systolic [id number]   Diastolic 63 72   Pulse 73 63      Physical Exam Constitutional:      Appearance: She is well-developed.  Cardiovascular:     Rate and Rhythm: Normal rate and regular rhythm.     Pulses: Intact distal pulses.     Heart sounds: Normal heart sounds.  Pulmonary:     Effort: Pulmonary effort is normal. No accessory muscle usage or respiratory distress.     Breath sounds: Normal breath sounds.  Abdominal:     General: Bowel sounds are normal.     Palpations: Abdomen is soft.    Laboratory examination:   External labs:  Cholesterol, total 138.000 m 05/03/2021 HDL 43.000 mg 05/03/2021 LDL 83.000 mg 05/03/2021 Triglycerides 60.000 mg 05/03/2021  A1C 5.200 % 08/11/2018  Hemoglobin 12.800 g/d 04/06/2019  Creatinine, Serum 0.800 mg/ 04/06/2019 Potassium 4.200 mEq 05/03/2021 ALT (SGPT)  6.000 IU/L 05/03/2021  TSH 1.980 05/03/2021  Medications and allergies   Allergies  Allergen Reactions   Ciprofloxacin Other (See Comments)    Can not take due to hx of Cdiff per patient   Wasp Venom Other (See Comments)    dizziness   Fluconazole Rash     Current Outpatient Medications:    APIXABAN (ELIQUIS) VTE STARTER PACK ('10MG'$  AND '5MG'$ ), Take as directed on package: start with two-'5mg'$  tablets twice daily for 7 days. On day 8, switch to one-'5mg'$  tablet twice daily. (Patient taking differently: Take 5 mg by mouth in the morning and at bedtime.), Disp: 1 each, Rfl: 0   atorvastatin (LIPITOR) 20 MG tablet, Take 1 tablet (20 mg total) by mouth daily after supper., Disp: 30 tablet, Rfl: 0    carbidopa-levodopa (SINEMET CR) 50-200 MG tablet, Take 1 tablet by mouth at bedtime., Disp: 90 tablet, Rfl: 1   carbidopa-levodopa (SINEMET IR) 25-100 MG tablet, 1.5 tablets at 7am/11am/4pm, Disp: 405 tablet, Rfl: 1   CRANBERRY PO, Take 300 mg daily by mouth., Disp: , Rfl:    Cyanocobalamin (VITAMIN B-12 PO), Take 1,200 mcg daily by mouth. , Disp: , Rfl:    EPINEPHrine 0.3 mg/0.3 mL IJ SOAJ injection, Inject 0.3 mg into the muscle once as needed for up to 1 dose (anaphylaxis)., Disp: 2 each, Rfl: 1   Ergocalciferol (VITAMIN D2) 2000 units TABS, Take 2,000 Units daily by mouth. , Disp: , Rfl:    furosemide (LASIX) 20 MG tablet, Take 20 mg by mouth 2 (two) times a week. Take 1 tablet  M, F, Disp: , Rfl:    KLOR-CON M10 10 MEQ tablet, 10 mEq 2 (two) times a week., Disp: , Rfl:    omeprazole (PRILOSEC) 40 MG capsule, Take 40 mg by mouth continuous as needed (indigestion)., Disp: , Rfl:    saccharomyces boulardii (FLORASTOR) 250 MG capsule, Take 250 mg by mouth 3 (three) times a week., Disp: , Rfl:    propranolol ER (INDERAL LA) 80 MG 24 hr capsule, TAKE 1 CAPSULE(80 MG) BY MOUTH DAILY (Patient taking differently: Take 80 mg by mouth daily.), Disp: 90 capsule, Rfl: 3  Radiology:   CT angiogram chest 01/21/2022: 1. Positive for acute PE with CT evidence of right heart strain (RV/LV Ratio = 1.12) consistent with at least submassive (intermediate risk) PE. The presence of right heart strain has been associated with an increased risk of morbidity and mortality. Please refer to the "Code PE Focused" order set in EPIC. 2. Interval wedge-shaped and patchy densities in the right lower lobe, suspicious for pulmonary infarction. 3. Interval increased enlargement of the central pulmonary arteries, compatible with pulmonary arterial hypertension associated with the pulmonary emboli. 4.  Calcific coronary artery and aortic atherosclerosis. 5. Stable 4.3 cm ascending thoracic aortic aneurysm, not needing further  follow-up unless otherwise clinically indicated. Stable since 2010.  Cardiac Studies:   Lower extremity venous duplex 01/21/2022: - No evidence of deep vein thrombosis seen in the lower extremities, Bilaterally.  Echocardiogram 04/01/2022: 1. Left ventricular ejection fraction, by estimation, is 60 to 65%. The left ventricle has normal function. The left ventricle has no regional wall motion abnormalities. There is mild left ventricular hypertrophy. Left ventricular diastolic parameters  are indeterminate.  2. Right ventricular systolic function is normal. The right ventricular size is normal. Tricuspid regurgitation signal is inadequate for assessing PA pressure.  3. Left atrial size was mildly dilated.  4. The mitral valve is normal in structure. Trivial mitral valve  regurgitation.  5. The aortic valve is tricuspid. Aortic valve regurgitation is mild to moderate. No aortic stenosis is present.  6. Aortic dilatation noted. There is dilatation of the ascending aorta, measuring 42 mm.  7. The inferior vena cava is normal in size with greater than 50% respiratory variability, suggesting right atrial pressure of 3 mmHg. Compared to 01/20/2022, moderate TR and severe pulmonary hypertension is no longer present.  EKG:  EKG 10/14/2022: Normal sinus rhythm at the rate of 61 bpm, normal EKG.  Compared to 10/05/2021, no significant change.   Assessment     ICD-10-CM   1. Aneurysm of ascending aorta without rupture stable since 2010 to 2023  I71.21 EKG 12-Lead    2. History of pulmonary embolism  Z86.711     3. Hypercholesteremia  E78.00      No orders of the defined types were placed in this encounter.  Medications Discontinued During This Encounter  Medication Reason   polyethylene glycol (MIRALAX / GLYCOLAX) 17 g packet Patient Preference    Orders Placed This Encounter  Procedures   EKG 12-Lead    Recommendations:   TANISH MASCHINO  is a 87 y.o. 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 She is followed by Dr. Carles Collet. She was admitted from 01/19/2022 to 01/22/2022 for unprovoked acute submassive pulmonary embolism and associated acute hypoxic respiratory failure.   Patient is overall doing well since last seen by [place] 1 year ago for aortic aneurysm follow up.  1. History of pulmonary embolism I reviewed her hospital records, patient had severe pulm hypertension and fortunately did not have any RV systolic dysfunction.  Repeat echocardiogram revealed significant reduction in pulmonary pressures and also maintenance of RV function and also LV function.  She is now on adequately treated anticoagulation with Eliquis 5 mg p.o. twice daily.  As it is the first episode of PE however it was unprovoked and also submassive, would probably recommend lifelong anticoagulation but certainly could consider reducing the dose to 2.5 mg p.o. twice daily in view of female age >45 years of age.  2. Aneurysm of ascending aorta without rupture stable since 2010 to 2023 With regard to aortic aneurysm, it has remained stable for the past 15 years or greater.  There is been no progression since she is on appropriate blood pressure management and also on a beta-blocker.  Continue the same, no further evaluation is indicated.  3. Hypercholesteremia Lipids being managed by PCP, under excellent control as well.  Overall stable from cardiac standpoint, I will see her back on a as needed basis.    Adrian Prows, MD, [facility] 10/14/2022, 11:15 AM Office: [phone] Pager: [phone]

## 2022-10-24 DIAGNOSIS — M5416 Radiculopathy, lumbar region: Secondary | ICD-10-CM | POA: Diagnosis not present

## 2022-10-29 ENCOUNTER — Ambulatory Visit (INDEPENDENT_AMBULATORY_CARE_PROVIDER_SITE_OTHER): Payer: Medicare PPO

## 2022-10-29 DIAGNOSIS — T63441D Toxic effect of venom of bees, accidental (unintentional), subsequent encounter: Secondary | ICD-10-CM | POA: Diagnosis not present

## 2022-10-29 IMAGING — DX DG CHEST 2V
2 series · 2 of 2 positions shown · non-contrast
Comparison: Chest x-ray 08/05/2017.

CLINICAL DATA: 85-year-old female with history of shortness of
breath.

EXAM:
CHEST - 2 VIEW

[chest pa]
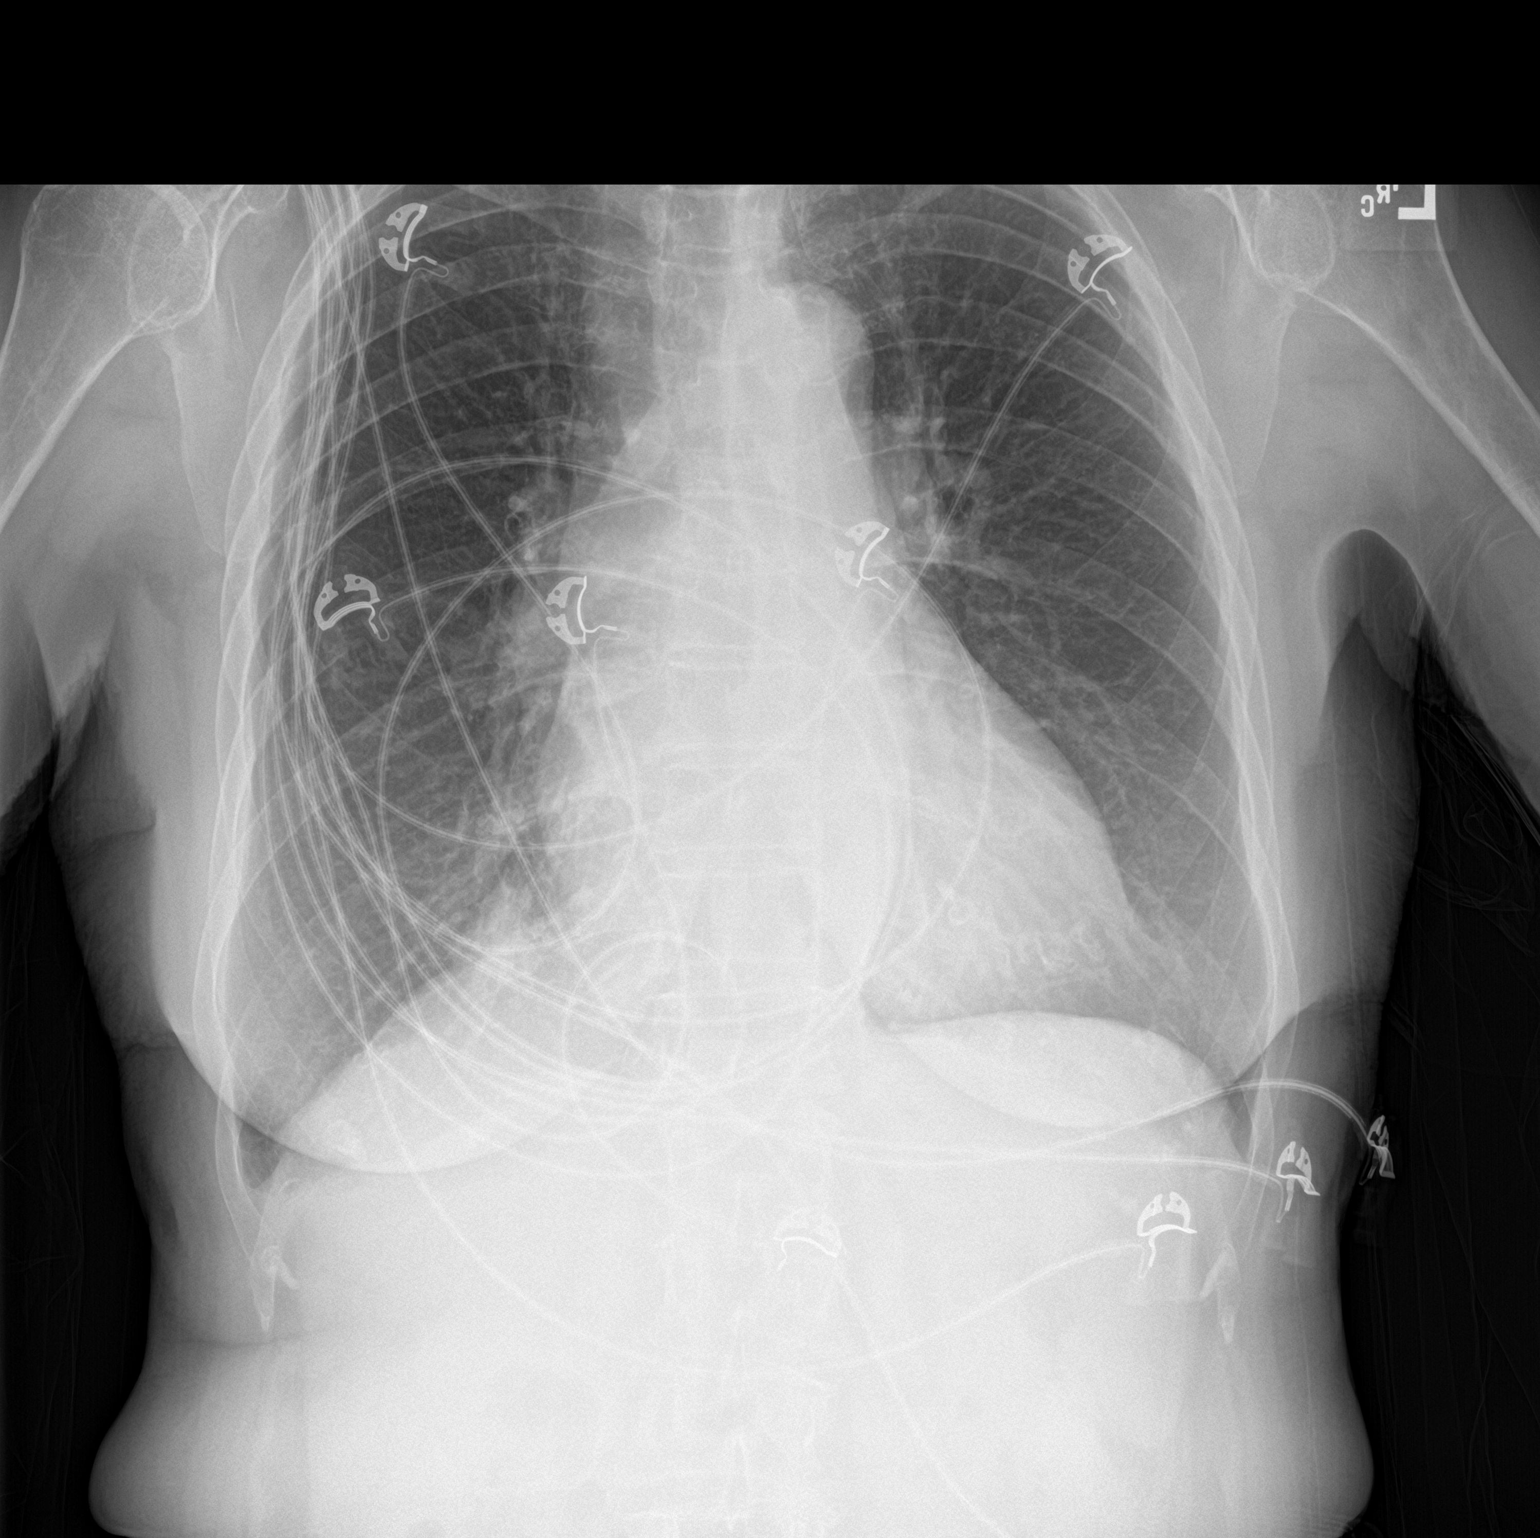

[chest lat]
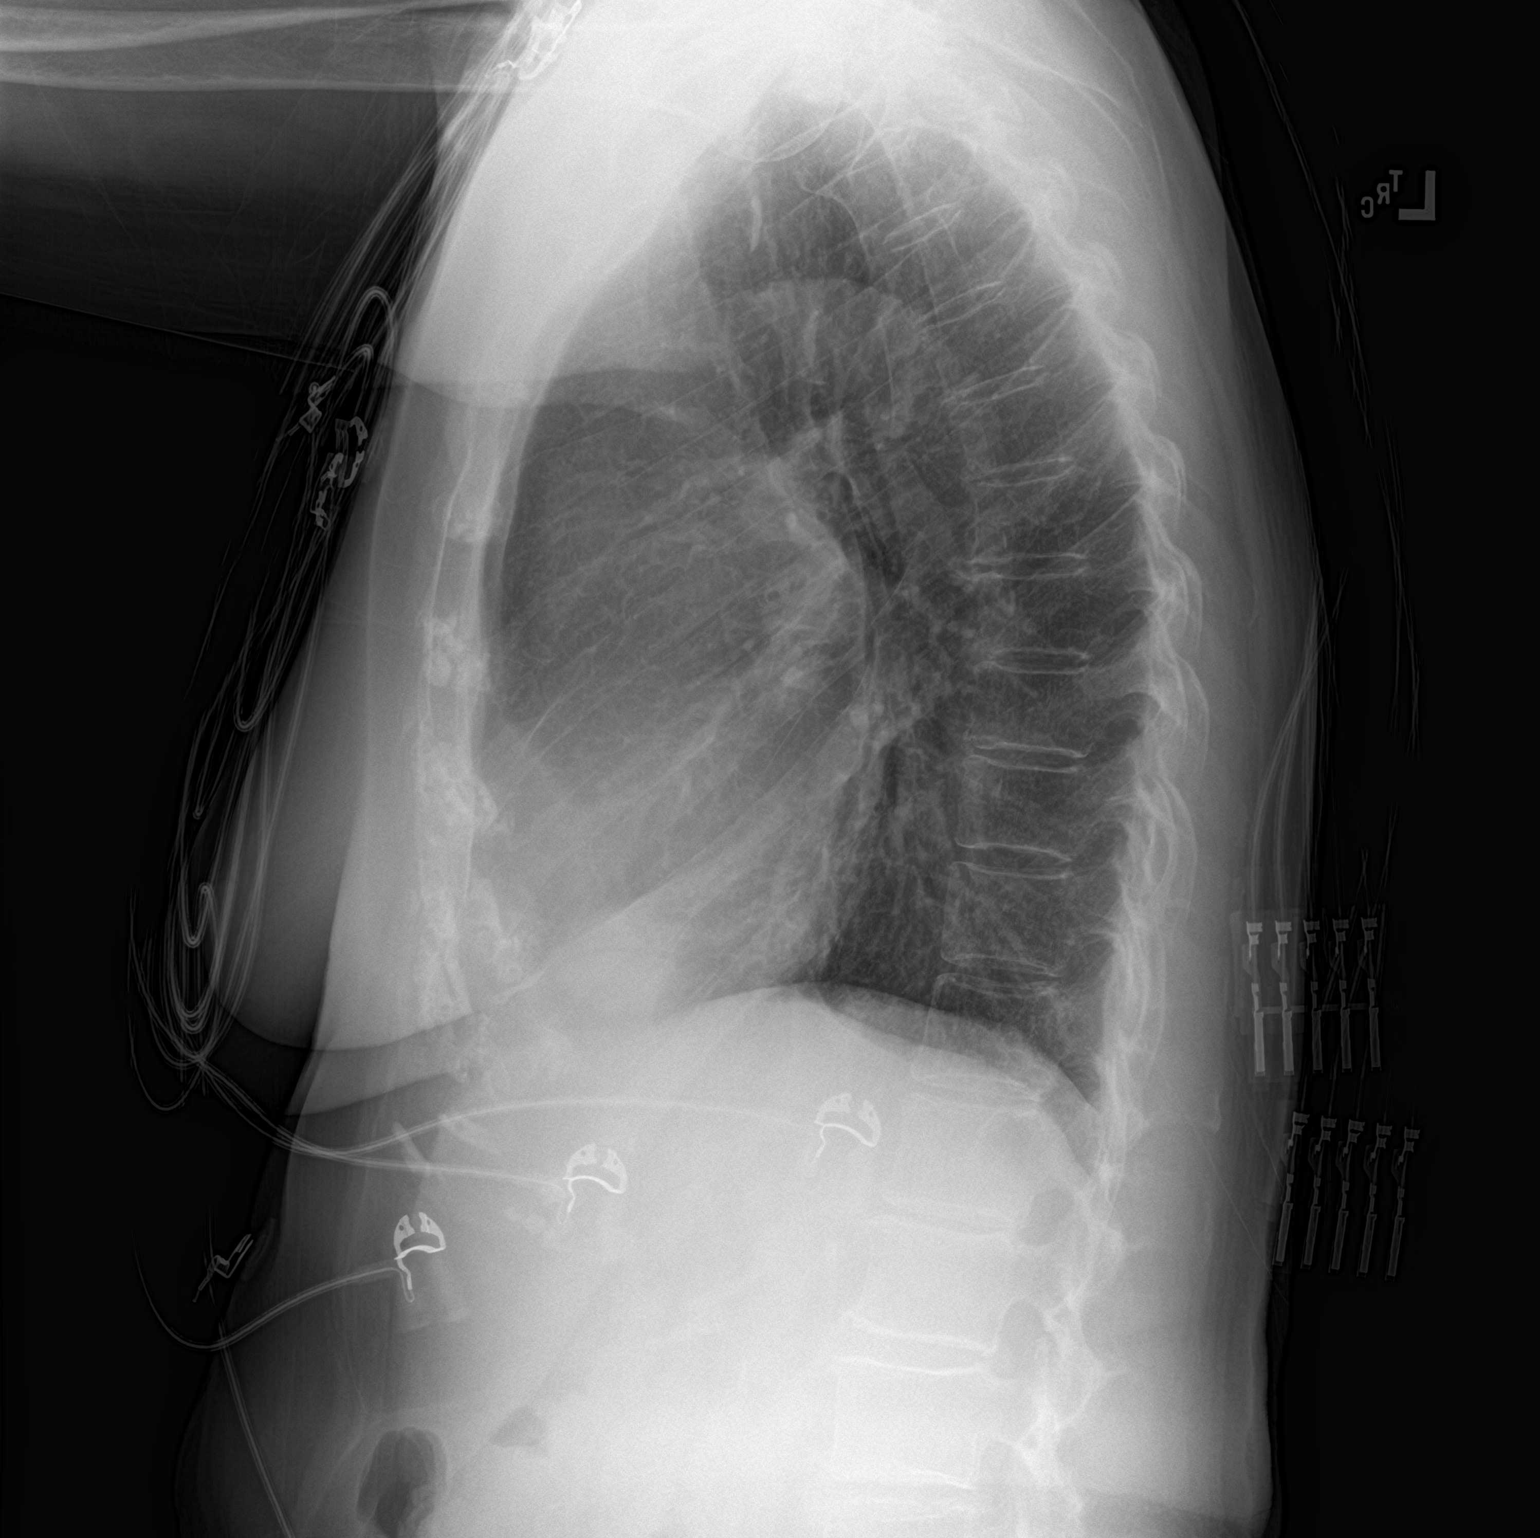

[2 of 2 positions shown; findings below may reference images not displayed]

FINDINGS: Ill-defined opacity in the medial segment of the right middle lobe
concerning for probable bronchopneumonia. Lung volumes are normal.
No pleural effusions. No pneumothorax. No pulmonary nodule or mass
noted. Pulmonary vasculature and the cardiomediastinal silhouette
are within normal limits. Atherosclerotic calcifications in the
thoracic aorta.
IMPRESSION: 1. Findings are concerning for bronchopneumonia in the medial
segment of the right middle lobe. Followup PA and lateral chest
X-ray is recommended in 3-4 weeks following trial of antibiotic
therapy to ensure resolution and exclude underlying malignancy.
2. Aortic atherosclerosis.

## 2022-10-30 ENCOUNTER — Ambulatory Visit: Payer: Medicare PPO

## 2022-11-11 DIAGNOSIS — Z6827 Body mass index (BMI) 27.0-27.9, adult: Secondary | ICD-10-CM | POA: Diagnosis not present

## 2022-11-11 DIAGNOSIS — M48062 Spinal stenosis, lumbar region with neurogenic claudication: Secondary | ICD-10-CM | POA: Diagnosis not present

## 2022-11-11 DIAGNOSIS — M5416 Radiculopathy, lumbar region: Secondary | ICD-10-CM | POA: Diagnosis not present

## 2022-12-20 ENCOUNTER — Ambulatory Visit: Payer: Medicare PPO | Admitting: Internal Medicine

## 2022-12-20 ENCOUNTER — Encounter: Payer: Self-pay | Admitting: Internal Medicine

## 2022-12-20 VITALS — BP 126/64 | HR 62 | Temp 97.7°F | Ht 65.0 in | Wt 166.6 lb

## 2022-12-20 DIAGNOSIS — Z86711 Personal history of pulmonary embolism: Secondary | ICD-10-CM

## 2022-12-20 DIAGNOSIS — R5383 Other fatigue: Secondary | ICD-10-CM

## 2022-12-20 DIAGNOSIS — E559 Vitamin D deficiency, unspecified: Secondary | ICD-10-CM | POA: Diagnosis not present

## 2022-12-20 LAB — BASIC METABOLIC PANEL
BUN: 25 mg/dL — ABNORMAL HIGH (ref 6–23)
CO2: 29 mEq/L (ref 19–32)
Calcium: 8.8 mg/dL (ref 8.4–10.5)
Chloride: 103 mEq/L (ref 96–112)
Creatinine, Ser: 0.97 mg/dL (ref 0.40–1.20)
GFR: 53.66 mL/min — ABNORMAL LOW (ref >60.00)
Glucose, Bld: 83 mg/dL (ref 70–99)
Potassium: 4.3 mEq/L (ref 3.5–5.1)
Sodium: 140 mEq/L (ref 135–145)

## 2022-12-20 LAB — VITAMIN D 25 HYDROXY (VIT D DEFICIENCY, FRACTURES): VITD: 48.06 ng/mL (ref 30.00–100.00)

## 2022-12-20 LAB — CBC WITH DIFFERENTIAL/PLATELET
Basophils Absolute: 0 10*3/uL (ref 0.0–0.1)
Basophils Relative: 0.8 % (ref 0.0–3.0)
Eosinophils Absolute: 0.1 10*3/uL (ref 0.0–0.7)
Eosinophils Relative: 1.9 % (ref 0.0–5.0)
HCT: 37.8 % (ref 36.0–46.0)
Hemoglobin: 12.7 g/dL (ref 12.0–15.0)
Lymphocytes Relative: 23.8 % (ref 12.0–46.0)
Lymphs Abs: 1.1 10*3/uL (ref 0.7–4.0)
MCHC: 33.5 g/dL (ref 30.0–36.0)
MCV: 93.7 fl (ref 78.0–100.0)
Monocytes Absolute: 0.3 10*3/uL (ref 0.1–1.0)
Monocytes Relative: 7.4 % (ref 3.0–12.0)
Neutro Abs: 2.9 10*3/uL (ref 1.4–7.7)
Neutrophils Relative %: 66.1 % (ref 43.0–77.0)
Platelets: 166 10*3/uL (ref 150.0–400.0)
RBC: 4.04 Mil/uL (ref 3.87–5.11)
RDW: 14.3 % (ref 11.5–15.5)
WBC: 4.4 10*3/uL (ref 4.0–10.5)

## 2022-12-20 LAB — HEPATIC FUNCTION PANEL
ALT: 3 U/L (ref 0–35)
AST: 10 U/L (ref 0–37)
Albumin: 4 g/dL (ref 3.5–5.2)
Alkaline Phosphatase: 100 U/L (ref 39–117)
Bilirubin, Direct: 0.2 mg/dL (ref 0.0–0.3)
Total Bilirubin: 0.7 mg/dL (ref 0.2–1.2)
Total Protein: 6.6 g/dL (ref 6.0–8.3)

## 2022-12-20 NOTE — Patient Instructions (Addendum)
ICD-10-CM   1. History of pulmonary embolism  Z86.711     2. Other fatigue  R53.83      STable clinically with normal exercise pulse ox metrics Coming up 1 year since PE without bleeding or recurrence on full dose eliquis  Plan  - check cbc, bmet, bnp, lft 12/20/2022 - check D-dimer 12/20/2022 - check Vitamin D 12/20/2022 - check ECHO next few weeks  Follouwop  - 4-6 weeks with APP -if d-dimer and echo ok then redue to eliquis 2,5mg  twice daily for next 1-2 years  - reasess if fatigue could be due to inderal

## 2022-12-20 NOTE — Progress Notes (Signed)
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 Last seen 02/11/2022.  She was admitted from 01/19/2022 to 01/22/2022 for unprovoked acute submassive pulmonary embolism and associated acute hypoxic respiratory failure.  Past medical history significant for Parkinson's disease, lumbar spinal stenosis, osteoarthritis, AAA without rupture, diastolic CHF, migraines, GERD, HLD.  TEST/EVENTS:  01/19/2022 CTA chest: multiple bilateral pulmonary arterial filling defcts. RV to LV ratio is 1:12. Interval increase in diameter of PA. 4.3 ascending thoracic aortic aneurysm, stable up to 10 years. No LAD. Minimal b/l dependent atelectasis. Interval wedge-shaped area of consolidation in the anteromedial aspect of the RLL with air bronchogram. Interval minimal irregular patchy density more superiorly and laterally in the RLL.  01/20/2022 echocardiogram: EF 60-65%; G1DD. RV size and function nl. Severely elevated PASP. Trivial MR. Moderate TR. AR mild. Mild dilatation of the ascending aorta.  04/01/2022 echocardiogram: EF 60-65%. LVH. RV size and function nl. Unable to measure PASP. Trivial MR. AR mild to moderate.   01/19/2022-01/22/2022: Hospitalization for acute respiratory failure related to unprovoked acute submassive PE with right heart strain on CTA. She had echocardiogram with normal right sided function and severely elevated PASP. She was treated with heparin drip and transitioned to PO Eliquis. Recommended lifelong as clot was unprovoked. She had RLL infiltrates on her imaging, suspected to be related to PE. She did receiver 1 dose of ceftriaxone and azithromycin; recommended outpatient imaging to ensure resolution.   02/11/2022: Linda Lee with [facility] for hospital follow up. She reports feeling better since being home. She still has some fatigue and occasional chest discomfort. She also gets winded more easily with activities than  she prior to this. Her cough is hacking but has cleared up and she is no longer producing sputum. All of her symptoms are steadily improving though and she feels like she is getting some energy back. She would like to go back to her exercise class for people with Parkinson's and wants to know if this would be okay. She denies any bleeding/excessive bruising, hemoptysis, fevers, chills. She is taking her Eliquis twice daily. Plan to repeat echo in 3 months.  04/29/2022: Today - follow up Patient presents today for follow up.  She has been doing relatively well since I saw her last.  Feels like her breathing is stable.  She still struggling with daily fatigue symptoms.  Feels like she just does not have much energy.  She also admits that she has not been doing much as far as exercise/activity.  Prior to her hospitalization, she was going to a exercise class directed for people with Parkinson's.  She has not returned to this since.  She was working with physical therapy though and felt like this was helping with her strength and back pain.  She denies any bleeding/excessive bruising, hemoptysis, epistaxis, shortness of breath, calf pain/swelling.  She is taking her Eliquis twice daily.  OV 12/20/2022  Subjective:  Patient ID: Linda Lee, female , DOB: 06-30-1936 , age 87 y.o. , MRN: [id number] , ADDRESS: [address] [place] [address] PCP Linda Ran, MD Patient Care Team: Linda Ran, MD as PCP - General (Internal Medicine) Tat, Octaviano Batty, DO as Consulting Physician (Neurology) Linda Anon, MD (Inactive) as Consulting Physician (Allergy and Immunology)  This Provider for this visit: Treatment Team:  Attending Provider: Kalman Shan, MD    12/20/2022 -   Chief Complaint  Patient presents with  Follow-up    Breathing is overall doing well. She tires easily and states that this is unchanged since her last visit.      HPI Linda Lee [id number] 87 y.o. -  Follow-up  submassive PE from June 2023   I saw her in the hospital.  And since then she has been seen by nurse practitioners.  She comes today for for follow-up.  She continues to do well.  She says she does not have any shortness of breath.  No coughing no wheezing no orthopnea no paroxysmal nocturnal dyspnea no chest pains.  She states that she continues to have fatigue after the PE.  This is her biggest complaint.  She says the fatigue started 3 days before the PE and is still continues although she did admit she was a lot better.  She continues to have chronic pedal edema with varicose veins and all her other medical problems.  She continues to be on Eliquis she has not had any bleeding episodes she is on Eliquis full dose.  For the fatigue today we checked her vitamin D level was normal.  She is on vitamin D supplements.  She is also on Enbrel.  She also wanted know how long to continue the Eliquis.  I did talk to her that best to continue full dose Eliquis for 1-2 years but if D-dimer and echo are normal may continue low-dose Eliquis for another couple of years particularly given her age and risk for bleeding.  She is fine with this approach.   Simple office walk 224 (66+46 x 2) feet Pod A at [facility] x  3 laps goal with forehead probe 12/20/2022    O2 used ra   Number laps completed Sit stand x 10   Comments about pace steady   Resting Pulse Ox/HR 100% and 59/min   Final Pulse Ox/HR 98% and 69/min   Desaturated </= 88% no   Desaturated <= 3% points no   Got Tachycardic >/= 90/min no   Symptoms at end of test none   Miscellaneous comments normal      LABS    PULMONARY No results for input(s): "PHART", "PCO2ART", "PO2ART", "HCO3", "TCO2", "O2SAT" in the last 168 hours.  Invalid input(s): "PCO2", "PO2"  CBC Recent Labs  Lab 12/20/22 1355  HGB 12.7  HCT 37.8  WBC 4.4  PLT 166.0    COAGULATION No results for input(s): "INR" in the last 168 hours.  CARDIAC  No results for input(s):  "TROPONINI" in the last 168 hours. No results for input(s): "PROBNP" in the last 168 hours.   CHEMISTRY Recent Labs  Lab 12/20/22 1355  NA 140  K 4.3  CL 103  CO2 29  GLUCOSE 83  BUN 25*  CREATININE 0.97  CALCIUM 8.8   Estimated Creatinine Clearance: 43.9 mL/min (by C-G formula based on SCr of 0.97 mg/dL).   LIVER Recent Labs  Lab 12/20/22 1355  AST 10  ALT 3  ALKPHOS 100  BILITOT 0.7  PROT 6.6  ALBUMIN 4.0    Latest Reference Range & Units 12/20/22 13:55  VITD 30.00 - 100.00 ng/mL 48.06    Latest Reference Range & Units 06/08/09 10:02 01/19/22 13:20 01/20/22 12:29 01/21/22 03:34 01/22/22 04:18 12/20/22 13:55  D-Dimer, Quant <0.50 mcg/mL FEU 0.43        AT THE INHOUSE ESTABLISHED CUTOFF VALUE OF 0.48 ug/mL FEU, THIS ASSAY HAS BEEN DOCUMENTED IN THE LITERATURE TO HAVE A SENSITIVITY AND NEGATIVE PREDICTIVE VALUE OF AT LEAST  98 TO 99%.  THE TEST RESULT SHOULD BE CORRELATED WITH AN ASSESSMENT OF THE CLINICAL PROBABILITY OF DVT / VTE. 1.96 (H) 1.47 (H) 1.49 (H) 1.42 (H) 0.20  (H): Data is abnormally high INFECTIOUS No results for input(s): "LATICACIDVEN", "PROCALCITON" in the last 168 hours.   ENDOCRINE CBG (last 3)  No results for input(s): "GLUCAP" in the last 72 hours.       IMAGING x48h  - image(s) personally visualized  -   highlighted in bold No results found.     has a past medical history of Anginal pain (HCC), Angio-edema, Arthritis, Atypical chest pain (07/06/2009), GERD (gastroesophageal reflux disease), Hyperlipemia, Migraines, and Toxic effect of venom of bees, unintentional (06/03/2017).   reports that she quit smoking about 59 years ago. Her smoking use included cigarettes. She has a 1.25 pack-year smoking history. She has never used smokeless tobacco.  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     denies surgery for this   TONSILLECTOMY     TOTAL KNEE ARTHROPLASTY Right 09/25/2015   Procedure: TOTAL KNEE ARTHROPLASTY;  Surgeon: Ollen Gross, MD;  Location: [facility];  Service: Orthopedics;  Laterality: Right;    Allergies  Allergen Reactions   Ciprofloxacin Other (See Comments)    Can not take due to hx of Cdiff per patient   Wasp Venom Other (See Comments)    dizziness   Fluconazole Rash    Immunization History  Administered Date(s) Administered   Fluad Quad(high Dose 65+) 04/20/2019   Influenza, High Dose Seasonal PF 05/05/2018   Influenza,inj,quad, With Preservative 05/06/2019   PFIZER(Purple Top)SARS-COV-2 Vaccination 09/10/2019, 10/05/2019, 03/05/2020   Unspecified SARS-COV-2 Vaccination 09/10/2019, 10/05/2019    Family History  Problem Relation Age of Onset   Prostate cancer Father    Healthy Daughter    Epilepsy Son    Healthy Son    Allergic rhinitis Neg Hx    Angioedema Neg Hx    Asthma Neg Hx    Eczema Neg Hx    Immunodeficiency Neg Hx    Urticaria Neg Hx      Current Outpatient Medications:    apixaban (ELIQUIS) 5 MG TABS tablet, Take 5 mg by mouth 2 (two) times daily., Disp: , Rfl:    atorvastatin (LIPITOR) 20 MG tablet, Take 1 tablet (20 mg total) by mouth daily after supper., Disp: 30 tablet, Rfl: 0   carbidopa-levodopa (SINEMET CR) 50-200 MG tablet, Take 1 tablet by mouth at bedtime., Disp: 90 tablet, Rfl: 1   CRANBERRY PO, Take 300 mg daily by mouth., Disp: , Rfl:    Cyanocobalamin (VITAMIN B-12 PO), Take 1,200 mcg daily by mouth. , Disp: , Rfl:    docusate sodium (COLACE) 100 MG capsule, Take 100 mg by mouth daily as needed for mild constipation., Disp: , Rfl:    EPINEPHrine 0.3 mg/0.3 mL IJ SOAJ injection, Inject 0.3 mg into the muscle once as needed  for up to 1 dose (anaphylaxis)., Disp: 2 each, Rfl: 1   Ergocalciferol (VITAMIN D2) 2000 units TABS, Take 2,000 Units daily by mouth. , Disp: , Rfl:    furosemide (LASIX) 20 MG tablet, Take 20 mg  by mouth every Monday, Wednesday, and Friday., Disp: , Rfl:    KLOR-CON M10 10 MEQ tablet, Take 10 mEq by mouth 3 (three) times a week., Disp: , Rfl:    omeprazole (PRILOSEC) 40 MG capsule, Take 40 mg by mouth continuous as needed (indigestion)., Disp: , Rfl:    propranolol ER (INDERAL LA) 80 MG 24 hr capsule, TAKE 1 CAPSULE(80 MG) BY MOUTH DAILY (Patient taking differently: Take 80 mg by mouth daily.), Disp: 90 capsule, Rfl: 3   saccharomyces boulardii (FLORASTOR) 250 MG capsule, Take 250 mg by mouth 3 (three) times a week., Disp: , Rfl:       Objective:   Vitals:   12/20/22 1316  BP: 126/64  Pulse: 62  Temp: 97.7 F (36.5 C)  TempSrc: Oral  SpO2: 99%  Weight: 166 lb 9.6 oz (75.6 kg)  Height: 5\' 5"  (1.651 m)    Estimated body mass index is 27.72 kg/m as calculated from the following:   Height as of this encounter: 5\' 5"  (1.651 m).   Weight as of this encounter: 166 lb 9.6 oz (75.6 kg).  @WEIGHTCHANGE @  [employer]   12/20/22 1316  Weight: 166 lb 9.6 oz (75.6 kg)     Physical Exam    General: No distress. Looks well. Parkinson tremor + Neuro: Alert and Oriented x 3. GCS 15. Speech normal Psych: Pleasant Resp:  Barrel Chest - no.  Wheeze - no, Crackles - no, No overt respiratory distress CVS: Normal heart sounds. Murmurs - no Ext: Stigmata of Connective Tissue Disease - NO. Bilateral leg swelling +. Varicose vein + HEENT: Normal upper airway. PEERL +. No post nasal drip        Assessment:       ICD-10-CM   1. History of pulmonary embolism  Z86.711 D-Dimer, Quantitative    ECHOCARDIOGRAM COMPLETE    2. Other fatigue  R53.83 D-Dimer, Quantitative    ECHOCARDIOGRAM COMPLETE         Plan:     Patient Instructions     ICD-10-CM   1. History of pulmonary  embolism  Z86.711     2. Other fatigue  R53.83      STable clinically with normal exercise pulse ox metrics Coming up 1 year since PE without bleeding or recurrence on full dose eliquis  Plan  - check cbc, bmet, bnp, lft 12/20/2022 - check D-dimer 12/20/2022 - check Vitamin D 12/20/2022 - check ECHO next few weeks  Follouwop  - 4-6 weeks with APP -if d-dimer and echo ok then redue to eliquis 2,5mg  twice daily for next 1-2 years    SIGNATURE    Dr. Kalman Lee, M.D., F.C.C.P,  Pulmonary and Critical Care Medicine Staff Physician, [facility] Center Director - Interstitial Lung Disease  Program  Pulmonary Fibrosis [facility] at [facility] [place], [place], [address]  Pager: [phone], If no answer or between  15:00h - 7:00h: call [phone] Telephone: [phone]  1:49 PM 12/20/2022

## 2022-12-21 LAB — D-DIMER, QUANTITATIVE: D-Dimer, Quant: 0.2 mcg/mL FEU (ref <0.50)

## 2022-12-21 LAB — BRAIN NATRIURETIC PEPTIDE: Brain Natriuretic Peptide: 108 pg/mL — ABNORMAL HIGH (ref <100)

## 2022-12-25 ENCOUNTER — Ambulatory Visit: Payer: Medicare PPO

## 2022-12-25 DIAGNOSIS — H26491 Other secondary cataract, right eye: Secondary | ICD-10-CM | POA: Diagnosis not present

## 2022-12-25 DIAGNOSIS — H52202 Unspecified astigmatism, left eye: Secondary | ICD-10-CM | POA: Diagnosis not present

## 2022-12-25 DIAGNOSIS — Z961 Presence of intraocular lens: Secondary | ICD-10-CM | POA: Diagnosis not present

## 2022-12-25 DIAGNOSIS — H04122 Dry eye syndrome of left lacrimal gland: Secondary | ICD-10-CM | POA: Diagnosis not present

## 2022-12-27 ENCOUNTER — Telehealth: Payer: Self-pay | Admitting: Internal Medicine

## 2022-12-27 ENCOUNTER — Ambulatory Visit (INDEPENDENT_AMBULATORY_CARE_PROVIDER_SITE_OTHER): Payer: Medicare PPO

## 2022-12-27 DIAGNOSIS — T63441D Toxic effect of venom of bees, accidental (unintentional), subsequent encounter: Secondary | ICD-10-CM

## 2022-12-27 NOTE — Telephone Encounter (Signed)
D- dimer normal but await echo -> ok to reduce at followup after echo the eliqusi to 2.5mg  bid . She needs to wait till June to complete  1 year of full dose eliquids Rx

## 2023-01-01 NOTE — Telephone Encounter (Signed)
PT ret Leslie's call. Please try again. [phone]

## 2023-01-01 NOTE — Telephone Encounter (Signed)
Called the pt and there was no answer- LMTCB    

## 2023-01-06 NOTE — Telephone Encounter (Signed)
Attempted to call pt but unable to reach. Left message to return call.  

## 2023-01-06 NOTE — Telephone Encounter (Signed)
ATC LMTCB x3

## 2023-01-06 NOTE — Telephone Encounter (Signed)
Patient is returning phone call. Patient phone number is [phone].

## 2023-01-17 ENCOUNTER — Ambulatory Visit: Payer: Medicare PPO | Admitting: Nurse Practitioner

## 2023-01-20 ENCOUNTER — Ambulatory Visit (HOSPITAL_COMMUNITY)
Admission: RE | Admit: 2023-01-20 | Discharge: 2023-01-20 | Disposition: A | Discharge status: Home / Self Care | Payer: Medicare PPO | Source: Ambulatory Visit | Attending: Internal Medicine | Admitting: Internal Medicine

## 2023-01-20 DIAGNOSIS — I2609 Other pulmonary embolism with acute cor pulmonale: Secondary | ICD-10-CM | POA: Diagnosis not present

## 2023-01-20 DIAGNOSIS — R079 Chest pain, unspecified: Secondary | ICD-10-CM | POA: Insufficient documentation

## 2023-01-20 DIAGNOSIS — E785 Hyperlipidemia, unspecified: Secondary | ICD-10-CM | POA: Diagnosis not present

## 2023-01-20 DIAGNOSIS — I080 Rheumatic disorders of both mitral and aortic valves: Secondary | ICD-10-CM | POA: Diagnosis not present

## 2023-01-20 DIAGNOSIS — R5383 Other fatigue: Secondary | ICD-10-CM

## 2023-01-20 DIAGNOSIS — Z86711 Personal history of pulmonary embolism: Secondary | ICD-10-CM

## 2023-01-20 LAB — ECHOCARDIOGRAM COMPLETE
Area-P 1/2: 4.49 cm2
Calc EF: 66.8 %
P 1/2 time: 636 msec
S' Lateral: 2.7 cm
Single Plane A2C EF: 65.9 %
Single Plane A4C EF: 64.7 %

## 2023-01-20 NOTE — Progress Notes (Signed)
Echocardiogram 2D Echocardiogram has been performed.  Linda Lee 01/20/2023, 10:50 AM

## 2023-01-20 NOTE — Progress Notes (Signed)
Normal echo. Mild dilatation of aortic root. Wil be discussed by NP during OV

## 2023-01-22 ENCOUNTER — Encounter: Payer: Self-pay | Admitting: Nurse Practitioner

## 2023-01-22 ENCOUNTER — Ambulatory Visit: Payer: Medicare PPO | Admitting: Nurse Practitioner

## 2023-01-22 VITALS — BP 110/64 | HR 60 | Ht 64.5 in | Wt 168.0 lb

## 2023-01-22 DIAGNOSIS — I2609 Other pulmonary embolism with acute cor pulmonale: Secondary | ICD-10-CM

## 2023-01-22 DIAGNOSIS — Z86711 Personal history of pulmonary embolism: Secondary | ICD-10-CM | POA: Diagnosis not present

## 2023-01-22 DIAGNOSIS — R5383 Other fatigue: Secondary | ICD-10-CM

## 2023-01-22 MED ORDER — APIXABAN 2.5 MG PO TABS: 2.5000 mg | ORAL_TABLET | Freq: Two times a day (BID) | ORAL | 11 refills | Status: DC

## 2023-01-22 NOTE — Patient Instructions (Addendum)
Continue Eliquis 5 mg Twice daily until you finish your current bottle then you will decreased to Eliquis 2.5 mg Twice daily . Continue with bleeding precautions and if you fall and hit your head, please seek emergency care.   I will discuss your echo with Dr. Jacinto Halim and let you know if there are any knew recommendations    Follow up in 6 months with Dr. Marchelle Gearing or sooner if needed

## 2023-01-22 NOTE — Assessment & Plan Note (Signed)
Possibly deconditioning.  Encouraged her to start working on greater exercises.  She does live a relatively sedentary lifestyle right now.  Workup thus far has been unrevealing.  She does not have any symptoms consistent with sleep apnea.  She would not like to have a home sleep study either.

## 2023-01-22 NOTE — Progress Notes (Addendum)
@Patient  ID: [name], female    DOB: Oct 07, 1935, 87 y.o.   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 Last seen 12/20/2022.  She was admitted from 01/19/2022 to 01/22/2022 for unprovoked acute submassive pulmonary embolism and associated acute hypoxic respiratory failure.  Past medical history significant for Parkinson's disease, lumbar spinal stenosis, osteoarthritis, AAA without rupture, diastolic CHF, migraines, GERD, HLD.  TEST/EVENTS:  01/19/2022 CTA chest: multiple bilateral pulmonary arterial filling defcts. RV to LV ratio is 1:12. Interval increase in diameter of PA. 4.3 ascending thoracic aortic aneurysm, stable up to 10 years. No LAD. Minimal b/l dependent atelectasis. Interval wedge-shaped area of consolidation in the anteromedial aspect of the RLL with air bronchogram. Interval minimal irregular patchy density more superiorly and laterally in the RLL.  01/20/2022 echocardiogram: EF 60-65%; G1DD. RV size and function nl. Severely elevated PASP. Trivial MR. Moderate TR. AR mild. Mild dilatation of the ascending aorta.  04/01/2022 echocardiogram: EF 60-65%. LVH. RV size and function nl. Unable to measure PASP. Trivial MR. AR mild to moderate.  01/22/2023 echo: EF 60 to 65%.  Diastolic parameters indeterminate.  RV size and function normal.  Prominent RV moderator band.  Unable to measure PASP.  Mild MR.  Mild AR.  No AAS.  Mild dilatation of the ascending aorta, 42 mm.  01/19/2022-01/22/2022: Hospitalization for acute respiratory failure related to unprovoked acute submassive PE with right heart strain on CTA. She had echocardiogram with normal right sided function and severely elevated PASP. She was treated with heparin drip and transitioned to PO Eliquis. Recommended lifelong as clot was unprovoked. She  had RLL infiltrates on her imaging, suspected to be related to PE. She did receiver 1 dose of ceftriaxone and azithromycin; recommended outpatient imaging to ensure resolution.   02/11/2022: [name] with Tyrianna Lightle NP for hospital follow up. She reports feeling better since being home. She still has some fatigue and occasional chest discomfort. She also gets winded more easily with activities than she prior to this. Her cough is hacking but has cleared up and she is no longer producing sputum. All of her symptoms are steadily improving though and she feels like she is getting some energy back. She would like to go back to her exercise class for people with Parkinson's and wants to know if this would be okay. She denies any bleeding/excessive bruising, hemoptysis, fevers, chills. She is taking her Eliquis twice daily. Plan to repeat echo in 3 months.  04/29/2022: OV with Oluwadarasimi Redmon NP for follow up.  She has been doing relatively well since I saw her last.  Feels like her breathing is stable.  She still struggling with daily fatigue symptoms.  Feels like she just does not have much energy.  She also admits that she has not been doing much as far as exercise/activity.  Prior to her hospitalization, she was going to a exercise class directed for people with Parkinson's.  She has not returned to this since.  She was working with physical therapy though and felt like this was helping with her strength and back pain.  She denies any bleeding/excessive bruising, hemoptysis, epistaxis, shortness of breath, calf pain/swelling.  She is taking her Eliquis twice daily.  12/19/2021: OV with Dr. Marchelle Gearing.  Not having any residual respiratory symptoms.  Continues to have fatigue after the  PE.  This is her biggest complaint.  Does admit she is a lot better.  She is on Eliquis.  Has not had any bleeding episodes.  She had a recent normal vitamin D.  Wanted to know how long to continue Eliquis.  Discussed that it is best to continue full dose  Eliquis for 1 to 2 years but if D-dimer and echo are normal, may continue low-dose for another couple of years particularly given her age and risk for bleeding.  She was agreeable to this approach.  Echo ordered.  CBC, BMET, liver function, and BNP ordered. D dimer negative. Plan to reduce dose to 2.5 mg bid if echo nl.  01/22/2023: Today - follow up Patient presents today for follow-up.  She had echocardiogram with some mild dilatation of aortic root, which was unchanged compared to August 2023.  She did not have any significant RV strain.  She did have a prominent RV moderator band visualized.  EF normal.  Tells me today that she is feeling relatively stable compared to her last visit.  Does not have any trouble with her breathing.  She would like to go ahead and move forward with decreasing her Eliquis if possible.  No recent episodes of bleeding or excessive bruising.  Still having some fatigue symptoms but workup has been unremarkable.  She denies any issues with her sleep at night, snoring, witnessed apneas, morning headaches, drowsy driving.  Allergies  Allergen Reactions   Ciprofloxacin Other (See Comments)    Can not take due to hx of Cdiff per patient   Wasp Venom Other (See Comments)    dizziness   Fluconazole Rash    Immunization History  Administered Date(s) Administered   Fluad Quad(high Dose 65+) 04/20/2019   Influenza, High Dose Seasonal PF 05/05/2018   Influenza,inj,quad, With Preservative 05/06/2019   PFIZER(Purple Top)SARS-COV-2 Vaccination 09/10/2019, 10/05/2019, 03/05/2020   Unspecified SARS-COV-2 Vaccination 09/10/2019, 10/05/2019    Past Medical History:  Diagnosis Date   Anginal pain (HCC)    Angio-edema    Arthritis    Atypical chest pain 07/06/2009   Qualifier: Diagnosis of  By: Clifton James, MD, Christopher     GERD (gastroesophageal reflux disease)    Hyperlipemia    Migraines    Toxic effect of venom of bees, unintentional 06/03/2017    Tobacco  History: Social History   Tobacco Use  Smoking Status Former   Packs/day: 0.25   Years: 5.00   Additional pack years: 0.00   Total pack years: 1.25   Types: Cigarettes   Quit date: 08/15/1963   Years since quitting: 59.4  Smokeless Tobacco Never   Counseling given: Not Answered   Outpatient Medications Prior to Visit  Medication Sig Dispense Refill   atorvastatin (LIPITOR) 20 MG tablet Take 1 tablet (20 mg total) by mouth daily after supper. 30 tablet 0   carbidopa-levodopa (SINEMET CR) 50-200 MG tablet Take 1 tablet by mouth at bedtime. 90 tablet 1   CRANBERRY PO Take 300 mg daily by mouth.     Cyanocobalamin (VITAMIN B-12 PO) Take 1,200 mcg daily by mouth.      docusate sodium (COLACE) 100 MG capsule Take 100 mg by mouth daily as needed for mild constipation.     EPINEPHrine 0.3 mg/0.3 mL IJ SOAJ injection Inject 0.3 mg into the muscle once as needed for up to 1 dose (anaphylaxis). 2 each 1   Ergocalciferol (VITAMIN D2) 2000 units TABS Take 2,000 Units daily by mouth.  furosemide (LASIX) 20 MG tablet Take 20 mg by mouth every Monday, Wednesday, and Friday.     KLOR-CON M10 10 MEQ tablet Take 10 mEq by mouth 3 (three) times a week.     omeprazole (PRILOSEC) 40 MG capsule Take 40 mg by mouth continuous as needed (indigestion).     propranolol ER (INDERAL LA) 80 MG 24 hr capsule TAKE 1 CAPSULE(80 MG) BY MOUTH DAILY (Patient taking differently: Take 80 mg by mouth daily.) 90 capsule 3   saccharomyces boulardii (FLORASTOR) 250 MG capsule Take 250 mg by mouth 3 (three) times a week.     apixaban (ELIQUIS) 5 MG TABS tablet Take 5 mg by mouth 2 (two) times daily.     No facility-administered medications prior to visit.     Review of Systems:   Constitutional: No weight loss or gain, night sweats, fevers, chills +fatigue (baseline) HEENT: No headaches, difficulty swallowing, tooth/dental problems, or sore throat. No sneezing, itching, ear ache, nasal congestion, or post nasal  drip CV:  No chest pain, orthopnea, PND, swelling in lower extremities, anasarca, dizziness, palpitations, syncope Resp: No shortness of breath.  No cough.  No excess mucus or change in color of mucus. No hemoptysis. No wheezing.  No chest wall deformity GI:  No heartburn, indigestion, abdominal pain, nausea, vomiting, diarrhea, change in bowel habits, loss of appetite, bloody stools.  GU: No dysuria, change in color of urine, urgency or frequency.  No flank pain, no hematuria  Skin: No rash, lesions, ulcerations Neuro: No dizziness or lightheadedness.  Psych: No depression or anxiety. Mood stable.     Physical Exam:  BP 110/64   Pulse 60   Ht 5' 4.5" (1.638 m)   Wt 168 lb (76.2 kg)   SpO2 94%   BMI 28.39 kg/m   GEN: Pleasant, interactive, well-appearing; in no acute distress. HEENT:  Normocephalic and atraumatic. PERRLA. Sclera white. Nasal turbinates pink, moist and patent bilaterally. No rhinorrhea present. Oropharynx pink and moist, without exudate or edema. No lesions, ulcerations, or postnasal drip.  NECK:  Supple w/ fair ROM. No JVD present.  CV: RRR, no m/r/g, no peripheral edema. Pulses intact, +2 bilaterally. No cyanosis, pallor or clubbing. PULMONARY:  Unlabored, regular breathing. Clear bilaterally A&P w/o wheezes/rales/rhonchi. No accessory muscle use. No dullness to percussion. GI: BS present and normoactive. Soft, non-tender to palpation. No organomegaly or masses detected.  MSK: No erythema, warmth or tenderness. No deformities or joint swelling noted.  Neuro: A/Ox3. No focal deficits noted.   Skin: Warm, no lesions or rashe Psych: Normal affect and behavior. Judgement and thought content appropriate.     Lab Results:  CBC    Component Value Date/Time   WBC 4.4 12/20/2022 1355   RBC 4.04 12/20/2022 1355   HGB 12.7 12/20/2022 1355   HCT 37.8 12/20/2022 1355   PLT 166.0 12/20/2022 1355   MCV 93.7 12/20/2022 1355   MCH 30.7 01/21/2022 0334   MCHC 33.5  12/20/2022 1355   RDW 14.3 12/20/2022 1355   LYMPHSABS 1.1 12/20/2022 1355   MONOABS 0.3 12/20/2022 1355   EOSABS 0.1 12/20/2022 1355   BASOSABS 0.0 12/20/2022 1355    BMET    Component Value Date/Time   NA 140 12/20/2022 1355   K 4.3 12/20/2022 1355   CL 103 12/20/2022 1355   CO2 29 12/20/2022 1355   GLUCOSE 83 12/20/2022 1355   BUN 25 (H) 12/20/2022 1355   CREATININE 0.97 12/20/2022 1355   CALCIUM 8.8 12/20/2022 1355  GFRNONAA >60 01/21/2022 0334   GFRAA >60 08/11/2018 1039    BNP    Component Value Date/Time   BNP 108 (H) 12/20/2022 1355     Imaging:  ECHOCARDIOGRAM COMPLETE  Result Date: 01/20/2023    ECHOCARDIOGRAM REPORT   Patient Name:   LARCENIA BUDMAN Date of Exam: 01/20/2023 Medical Rec #:  [id number]          Height:       65.0 in Accession #:    [id number]         Weight:       166.6 lb Date of Birth:  [birth year]         BSA:          1.830 m Patient Age:    86 years           BP:           126/64 mmHg Patient Gender: F                  HR:           57 bpm. Exam Location:  Outpatient Procedure: 2D Echo, Cardiac Doppler, Color Doppler and 3D Echo Indications:    Pulmonary Embolus I26.09  History:        Patient has prior history of Echocardiogram examinations, most                 recent 04/01/2022. Signs/Symptoms:Chest Pain; Risk                 Factors:Dyslipidemia.  Sonographer:    Eulah Pont RDCS Referring Phys: [id number] MURALI RAMASWAMY IMPRESSIONS  1. Left ventricular ejection fraction, by estimation, is 60 to 65%. Left ventricular ejection fraction by 3D volume is 65 %. The left ventricle has normal function. The left ventricle has no regional wall motion abnormalities. Left ventricular diastolic  parameters are indeterminate.  2. Right ventricular systolic function is normal. The right ventricular size is normal. A Prominent RV moderator band is visualized. Tricuspid regurgitation signal is inadequate for assessing PA pressure.  3. The mitral valve is normal in  structure. Mild mitral valve regurgitation. No evidence of mitral stenosis.  4. The aortic valve is tricuspid. Aortic valve regurgitation is mild. Aortic valve sclerosis is present, with no evidence of aortic valve stenosis. Aortic regurgitation PHT measures 636 msec.  5. Aortic dilatation noted. There is mild dilatation of the ascending aorta, measuring 42 mm.  6. The inferior vena cava is normal in size with greater than 50% respiratory variability, suggesting right atrial pressure of 3 mmHg. FINDINGS  Left Ventricle: Left ventricular ejection fraction, by estimation, is 60 to 65%. Left ventricular ejection fraction by 3D volume is 65 %. The left ventricle has normal function. The left ventricle has no regional wall motion abnormalities. The left ventricular internal cavity size was normal in size. There is no left ventricular hypertrophy. Left ventricular diastolic parameters are indeterminate. Normal left ventricular filling pressure. Right Ventricle: The right ventricular size is normal. Right vetricular wall thickness was not assessed. Right ventricular systolic function is normal. Tricuspid regurgitation signal is inadequate for assessing PA pressure. Left Atrium: Left atrial size was normal in size. Right Atrium: Right atrial size was normal in size. Pericardium: There is no evidence of pericardial effusion. Mitral Valve: The mitral valve is normal in structure. Mild mitral valve regurgitation. No evidence of mitral valve stenosis. Tricuspid Valve: The tricuspid valve is normal in structure. Tricuspid valve regurgitation is not demonstrated. No  evidence of tricuspid stenosis. Aortic Valve: The aortic valve is tricuspid. Aortic valve regurgitation is mild. Aortic regurgitation PHT measures 636 msec. Aortic valve sclerosis is present, with no evidence of aortic valve stenosis. Pulmonic Valve: The pulmonic valve was normal in structure. Pulmonic valve regurgitation is mild. No evidence of pulmonic stenosis.  Aorta: Aortic dilatation noted. There is mild dilatation of the ascending aorta, measuring 42 mm. Venous: The inferior vena cava is normal in size with greater than 50% respiratory variability, suggesting right atrial pressure of 3 mmHg. IAS/Shunts: No atrial level shunt detected by color flow Doppler. Additional Comments: A Prominent RV moderator band is visualized.  LEFT VENTRICLE PLAX 2D LVIDd:         4.40 cm         Diastology LVIDs:         2.70 cm         LV e' medial:    5.01 cm/s LV PW:         1.00 cm         LV E/e' medial:  13.3 LV IVS:        1.00 cm         LV e' lateral:   5.68 cm/s LVOT diam:     1.80 cm         LV E/e' lateral: 11.7 LV SV:         55 LV SV Index:   30 LVOT Area:     2.54 cm        3D Volume EF                                LV 3D EF:    Left                                             ventricul LV Volumes (MOD)                            ar LV vol d, MOD    61.5 ml                    ejection A2C:                                        fraction LV vol d, MOD    51.5 ml                    by 3D A4C:                                        volume is LV vol s, MOD    21.0 ml                    65 %. A2C: LV vol s, MOD    18.2 ml A4C:                           3D Volume EF: LV SV MOD A2C:  40.5 ml       3D EF:        65 % LV SV MOD A4C:   51.5 ml       LV EDV:       95 ml LV SV MOD BP:    39.3 ml       LV ESV:       33 ml                                LV SV:        62 ml LEFT ATRIUM             Index        RIGHT ATRIUM           Index LA diam:        3.40 cm 1.86 cm/m   RA Area:     14.40 cm LA Vol (A2C):   31.0 ml 16.94 ml/m  RA Volume:   30.90 ml  16.88 ml/m LA Vol (A4C):   29.5 ml 16.12 ml/m LA Biplane Vol: 30.3 ml 16.55 ml/m  AORTIC VALVE             PULMONIC VALVE LVOT Vmax:   78.40 cm/s  PR End Diast Vel: 3.77 msec LVOT Vmean:  51.300 cm/s LVOT VTI:    0.218 m AI PHT:      636 msec  AORTA Ao Root diam: 3.40 cm Ao Asc diam:  4.20 cm MITRAL VALVE MV Area (PHT): 4.49 cm     SHUNTS MV Decel Time: 169 msec    Systemic VTI:  0.22 m MV E velocity: 66.70 cm/s  Systemic Diam: 1.80 cm MV A velocity: 64.40 cm/s MV E/A ratio:  1.04 Armanda Magic MD Electronically signed by Armanda Magic MD Signature Date/Time: 01/20/2023/12:34:20 PM    Final           No data to display          No results found for: "NITRICOXIDE"      Assessment & Plan:   Acute pulmonary embolism (HCC) Unprovoked PE hospitalized in June 2023.  She has been on full dose Eliquis over the past year.  D-dimer was negative.  Echo was stable compared to prior.  She did have a prominent moderator band, which I will follow-up with Dr. Jacinto Halim regarding and determine if any further evaluation is necessary.  Plan to decrease her Eliquis to low-dose 2.5 mg twice a day.  She will continue current bleeding precautions.  New Rx sent.  Patient Instructions  Continue Eliquis 5 mg Twice daily until you finish your current bottle then you will decreased to Eliquis 2.5 mg Twice daily . Continue with bleeding precautions and if you fall and hit your head, please seek emergency care.   I will discuss your echo with Dr. Jacinto Halim and let you know if there are any knew recommendations    Follow up in 6 months with Dr. Marchelle Gearing or sooner if needed    Fatigue Possibly deconditioning.  Encouraged her to start working on greater exercises.  She does live a relatively sedentary lifestyle right now.  Workup thus far has been unrevealing.  She does not have any symptoms consistent with sleep apnea.  She would not like to have a home sleep study either.    I spent 32 minutes of dedicated to the care of this patient on the date of  this encounter to include pre-visit review of records, face-to-face time with the patient discussing conditions above, post visit ordering of testing, clinical documentation with the electronic health record, making appropriate referrals as documented, and communicating necessary findings to members of  the patients care team.  Noemi Chapel, NP 01/22/2023  Pt aware and understands NP's role.

## 2023-01-22 NOTE — Assessment & Plan Note (Signed)
Unprovoked PE hospitalized in June 2023.  She has been on full dose Eliquis over the past year.  D-dimer was negative.  Echo was stable compared to prior.  She did have a prominent moderator band, which I will follow-up with Dr. Jacinto Halim regarding and determine if any further evaluation is necessary.  Plan to decrease her Eliquis to low-dose 2.5 mg twice a day.  She will continue current bleeding precautions.  New Rx sent.  Patient Instructions  Continue Eliquis 5 mg Twice daily until you finish your current bottle then you will decreased to Eliquis 2.5 mg Twice daily . Continue with bleeding precautions and if you fall and hit your head, please seek emergency care.   I will discuss your echo with Dr. Jacinto Halim and let you know if there are any knew recommendations    Follow up in 6 months with Dr. Marchelle Gearing or sooner if needed

## 2023-02-03 DIAGNOSIS — Z1231 Encounter for screening mammogram for malignant neoplasm of breast: Secondary | ICD-10-CM | POA: Diagnosis not present

## 2023-02-05 ENCOUNTER — Other Ambulatory Visit: Payer: Self-pay | Admitting: Neurology

## 2023-02-05 DIAGNOSIS — G20A1 Parkinson's disease without dyskinesia, without mention of fluctuations: Secondary | ICD-10-CM

## 2023-02-07 ENCOUNTER — Other Ambulatory Visit: Payer: Self-pay

## 2023-02-10 ENCOUNTER — Other Ambulatory Visit: Payer: Self-pay

## 2023-02-10 DIAGNOSIS — G20A1 Parkinson's disease without dyskinesia, without mention of fluctuations: Secondary | ICD-10-CM | POA: Diagnosis not present

## 2023-02-10 DIAGNOSIS — M8580 Other specified disorders of bone density and structure, unspecified site: Secondary | ICD-10-CM | POA: Diagnosis not present

## 2023-02-10 DIAGNOSIS — I10 Essential (primary) hypertension: Secondary | ICD-10-CM | POA: Diagnosis not present

## 2023-02-10 DIAGNOSIS — I77810 Thoracic aortic ectasia: Secondary | ICD-10-CM | POA: Diagnosis not present

## 2023-02-10 DIAGNOSIS — I340 Nonrheumatic mitral (valve) insufficiency: Secondary | ICD-10-CM | POA: Diagnosis not present

## 2023-02-10 DIAGNOSIS — M5030 Other cervical disc degeneration, unspecified cervical region: Secondary | ICD-10-CM | POA: Diagnosis not present

## 2023-02-10 DIAGNOSIS — I351 Nonrheumatic aortic (valve) insufficiency: Secondary | ICD-10-CM | POA: Diagnosis not present

## 2023-02-10 DIAGNOSIS — I2699 Other pulmonary embolism without acute cor pulmonale: Secondary | ICD-10-CM | POA: Diagnosis not present

## 2023-02-10 DIAGNOSIS — E785 Hyperlipidemia, unspecified: Secondary | ICD-10-CM | POA: Diagnosis not present

## 2023-02-12 NOTE — Progress Notes (Unsigned)
Assessment/Plan:   1.  Parkinsons Disease  -Continue carbidopa/levodopa 25/100, 1.5 tablets 3 times per day  -continue carbidopa/levodopa 50/200 CR at bed for tremor at nighttime  -We discussed that it used to be thought that levodopa would increase risk of melanoma but now it is believed that Parkinsons itself likely increases risk of melanoma. she is to get regular skin checks.  -discussed PT but she wants to hold to see what Dr. Despina Hick says about her knees first.  Does plan to go to [facility] soon  -discussed caregiver issues as has taken care of husband for long time with Parkinsonism and dementia.  He is now at SNF   2.  Severe spinal canal stenosis at L3-L4  -Has seen Dr. Venetia Maxon previously and also seen Dr. Jake Samples now  3.  History of expressive aphasia, likely representing complicated migraine   -On Lipitor.  Aspirin was discontinued when she was placed on apixaban for a pulmonary embolus.  Extensive negative work-up.  4.  Sialorrhea  -This is commonly associated with PD.  We talked about treatments.  The patient is not a candidate for oral anticholinergic therapy because of increased risk of confusion and falls.  We discussed Botox (type A and B) and 1% atropine drops.  We discusssed that candy like lemon drops can help by stimulating mm of the oropharynx to induce swallowing.  She is not interested right now  5.    Pulmonary embolus  -On Eliquis.   Subjective:   Linda Lee was seen today in follow up for Parkinsons disease.  My previous records were reviewed prior to todays visit as well as outside records available to me.  She has had no falls since last visit.  She did recently see the nurse practitioner at pulmonary for her history of pulmonary embolism.  She was told she could decrease the Eliquis to 2.5 mg twice per day.  We added carbidopa/levodopa 50/200 q hs last visit for nighttime tremor.  This did help the nighttime tremor.  She was hoping to use the  nighttime to the day as well.  She is having some knee pain, thigh and buttocks pain but has appt with Dr. Despina Hick in a month.  She has seen Dr. Jake Samples for the back pain in the last few months.  Has bilateral feet paresthesias, L>R.    Current prescribed movement disorder medications: Carbidopa/levodopa 25/100, 1.5 tablets 3 times per day carbidopa/levodopa 50/200 cr q hs, added last visit for q hs tremor    ALLERGIES:   Allergies  Allergen Reactions   Ciprofloxacin Other (See Comments)    Can not take due to hx of Cdiff per patient   Wasp Venom Other (See Comments)    dizziness   Fluconazole Rash    CURRENT MEDICATIONS:  Outpatient Encounter Medications as of 02/13/2023  Medication Sig   apixaban (ELIQUIS) 2.5 MG TABS tablet Take 1 tablet (2.5 mg total) by mouth 2 (two) times daily.   atorvastatin (LIPITOR) 20 MG tablet Take 1 tablet (20 mg total) by mouth daily after supper.   carbidopa-levodopa (SINEMET CR) 50-200 MG tablet TAKE 1 TABLET BY MOUTH AT BEDTIME   CRANBERRY PO Take 300 mg daily by mouth.   Cyanocobalamin (VITAMIN B-12 PO) Take 1,200 mcg daily by mouth.    docusate sodium (COLACE) 100 MG capsule Take 100 mg by mouth daily as needed for mild constipation.   EPINEPHrine 0.3 mg/0.3 mL IJ SOAJ injection Inject 0.3 mg into the muscle  once as needed for up to 1 dose (anaphylaxis).   Ergocalciferol (VITAMIN D2) 2000 units TABS Take 2,000 Units daily by mouth.    furosemide (LASIX) 20 MG tablet Take 20 mg by mouth every Monday, Wednesday, and Friday.   KLOR-CON M10 10 MEQ tablet Take 10 mEq by mouth 3 (three) times a week.   omeprazole (PRILOSEC) 40 MG capsule Take 40 mg by mouth continuous as needed (indigestion).   propranolol ER (INDERAL LA) 80 MG 24 hr capsule TAKE 1 CAPSULE(80 MG) BY MOUTH DAILY (Patient taking differently: Take 80 mg by mouth daily.)   saccharomyces boulardii (FLORASTOR) 250 MG capsule Take 250 mg by mouth 3 (three) times a week.   No  facility-administered encounter medications on file as of 02/13/2023.    Objective:   PHYSICAL EXAMINATION:    VITALS:   Vitals:   02/13/23 1525  BP: 124/84  Pulse: 76  SpO2: 96%  Weight: 166 lb (75.3 kg)  Height: 5\' 5"  (1.651 m)    GEN:  The patient appears stated age and is in NAD. HEENT:  Normocephalic, atraumatic.  The mucous membranes are moist. The superficial temporal arteries are without ropiness or tenderness. Cv:  rrr Lungs:  ctab Neck:  no bruits  Neurological examination:  Orientation: The patient is alert and oriented x3. Cranial nerves: There is good facial symmetry without significant facial hypomimia. The speech is fluent and clear. Soft palate rises symmetrically and there is no tongue deviation. Hearing is intact to conversational tone. Sensation: Sensation is intact to light touch throughout.  No extinction with double simultaneous stimulation. Motor: Strength is 5/5 in the upper and lower extremities.  There is no pronator drift.  Movement examination: Tone: There is nl tone in the UE/LE Abnormal movements: there is tremor in the RLE/RUE Coordination:  There is no decremation today, with any form of RAMS, including alternating supination and pronation of the forearm, hand opening and closing, finger taps, heel taps and toe taps. Gait and Station: The patient pushes off.  She is careful with ambulation and is a bit unsteady.  I have reviewed and interpreted the following labs independently    Chemistry      Component Value Date/Time   NA 140 12/20/2022 1355   K 4.3 12/20/2022 1355   CL 103 12/20/2022 1355   CO2 29 12/20/2022 1355   BUN 25 (H) 12/20/2022 1355   CREATININE 0.97 12/20/2022 1355      Component Value Date/Time   CALCIUM 8.8 12/20/2022 1355   ALKPHOS 100 12/20/2022 1355   AST 10 12/20/2022 1355   ALT 3 12/20/2022 1355   BILITOT 0.7 12/20/2022 1355       Lab Results  Component Value Date   WBC 4.4 12/20/2022   HGB 12.7  12/20/2022   HCT 37.8 12/20/2022   MCV 93.7 12/20/2022   PLT 166.0 12/20/2022   Total time spent on today's visit was 41 minutes, including both face-to-face time and nonface-to-face time.  Time included that spent on review of records (prior notes available to me/labs/imaging if pertinent), discussing treatment and goals, answering patient's questions and coordinating care.   Cc:  Rodrigo Ran, MD

## 2023-02-13 ENCOUNTER — Encounter: Payer: Self-pay | Admitting: Neurology

## 2023-02-13 ENCOUNTER — Ambulatory Visit: Payer: Medicare PPO | Admitting: Neurology

## 2023-02-13 VITALS — BP 124/84 | HR 76 | Ht 65.0 in | Wt 166.0 lb

## 2023-02-13 DIAGNOSIS — M25561 Pain in right knee: Secondary | ICD-10-CM | POA: Diagnosis not present

## 2023-02-13 DIAGNOSIS — G20A1 Parkinson's disease without dyskinesia, without mention of fluctuations: Secondary | ICD-10-CM | POA: Diagnosis not present

## 2023-02-13 DIAGNOSIS — M25562 Pain in left knee: Secondary | ICD-10-CM | POA: Diagnosis not present

## 2023-02-13 MED ORDER — CARBIDOPA-LEVODOPA 25-100 MG PO TABS: 1.5000 | ORAL_TABLET | Freq: Three times a day (TID) | ORAL | 1 refills | Status: DC

## 2023-02-13 MED ORDER — CARBIDOPA-LEVODOPA ER 50-200 MG PO TBCR: 1.0000 | EXTENDED_RELEASE_TABLET | Freq: Every day | ORAL | 1 refills | Status: DC

## 2023-02-26 ENCOUNTER — Ambulatory Visit (INDEPENDENT_AMBULATORY_CARE_PROVIDER_SITE_OTHER): Payer: Medicare PPO

## 2023-02-26 DIAGNOSIS — T63441D Toxic effect of venom of bees, accidental (unintentional), subsequent encounter: Secondary | ICD-10-CM

## 2023-03-03 ENCOUNTER — Emergency Department (HOSPITAL_BASED_OUTPATIENT_CLINIC_OR_DEPARTMENT_OTHER)
Admission: EM | Admit: 2023-03-03 | Discharge: 2023-03-03 | Disposition: A | Discharge status: Home / Self Care | Payer: Medicare PPO | Attending: Emergency Medicine | Admitting: Emergency Medicine

## 2023-03-03 ENCOUNTER — Other Ambulatory Visit: Payer: Self-pay

## 2023-03-03 ENCOUNTER — Emergency Department (HOSPITAL_BASED_OUTPATIENT_CLINIC_OR_DEPARTMENT_OTHER): Payer: Medicare PPO

## 2023-03-03 DIAGNOSIS — S0990XA Unspecified injury of head, initial encounter: Secondary | ICD-10-CM | POA: Diagnosis not present

## 2023-03-03 DIAGNOSIS — Z7901 Long term (current) use of anticoagulants: Secondary | ICD-10-CM | POA: Diagnosis not present

## 2023-03-03 DIAGNOSIS — S0081XA Abrasion of other part of head, initial encounter: Secondary | ICD-10-CM | POA: Insufficient documentation

## 2023-03-03 DIAGNOSIS — W01198A Fall on same level from slipping, tripping and stumbling with subsequent striking against other object, initial encounter: Secondary | ICD-10-CM | POA: Diagnosis not present

## 2023-03-03 DIAGNOSIS — I6782 Cerebral ischemia: Secondary | ICD-10-CM | POA: Diagnosis not present

## 2023-03-03 DIAGNOSIS — W19XXXA Unspecified fall, initial encounter: Secondary | ICD-10-CM

## 2023-03-03 NOTE — ED Triage Notes (Signed)
Pt reports walking her dog up driveway this morning while road was wet. Had an unwitnessed fall and was able to get up alone. During fall she hit right side of face.  Reports taking 2.5 of Eliquis BID.

## 2023-03-03 NOTE — Discharge Instructions (Signed)
You were seen for your head injury in the emergency department.   At home, please use Tylenol for any pain.    Return immediately to the emergency department if you experience any of the following: Severe headache, vision changes, nausea or vomiting, or any other concerning symptoms.    Thank you for visiting our Emergency Department. It was a pleasure taking care of you today.

## 2023-03-03 NOTE — ED Provider Notes (Signed)
[facility] Provider Note   CSN: [id number] Arrival date & time: 03/03/23  6295     History  Chief Complaint  Patient presents with   Linda Lee is a 87 y.o. female.  87 year old female with a history of PE on Eliquis who presents emergency department after a fall.  Patient reports that she was walking her dog outside today and the ground was wet and she slipped and fell and hit her head.  No preceding symptoms.  Did not lose consciousness.  Last dose of Eliquis was this morning.  No neck pain, weakness or numbness of her arms or legs, or vision changes.  No other injuries as a result of the fall.  Up-to-date on her tetanus shot and believes it was in the last 10 years.       Home Medications Prior to Admission medications   Medication Sig Start Date End Date Taking? Authorizing Provider  apixaban (ELIQUIS) 2.5 MG TABS tablet Take 1 tablet (2.5 mg total) by mouth 2 (two) times daily. 01/22/23   Cobb, Ruby Cola, NP  atorvastatin (LIPITOR) 20 MG tablet Take 1 tablet (20 mg total) by mouth daily after supper. 08/11/18   Mesner, Barbara Cower, MD  carbidopa-levodopa (SINEMET CR) 50-200 MG tablet Take 1 tablet by mouth at bedtime. 02/13/23   Tat, Octaviano Batty, DO  carbidopa-levodopa (SINEMET IR) 25-100 MG tablet Take 1.5 tablets by mouth 3 (three) times daily. 02/13/23   Tat, Octaviano Batty, DO  CRANBERRY PO Take 300 mg daily by mouth.    [provider]  Cyanocobalamin (VITAMIN B-12 PO) Take 1,200 mcg daily by mouth.     [provider]  docusate sodium (COLACE) 100 MG capsule Take 100 mg by mouth daily as needed for mild constipation.    [provider]  EPINEPHrine 0.3 mg/0.3 mL IJ SOAJ injection Inject 0.3 mg into the muscle once as needed for up to 1 dose (anaphylaxis). 02/07/21   Nehemiah Settle, FNP  Ergocalciferol (VITAMIN D2) 2000 units TABS Take 2,000 Units daily by mouth.     [provider]   furosemide (LASIX) 20 MG tablet Take 20 mg by mouth every Monday, Wednesday, and Friday.    [provider]  KLOR-CON M10 10 MEQ tablet Take 10 mEq by mouth 3 (three) times a week. 05/11/19   [provider]  omeprazole (PRILOSEC) 40 MG capsule Take 40 mg by mouth continuous as needed (indigestion). 06/15/18   [provider]  propranolol ER (INDERAL LA) 80 MG 24 hr capsule TAKE 1 CAPSULE(80 MG) BY MOUTH DAILY Patient taking differently: Take 80 mg by mouth daily. 05/15/21   Yates Decamp, MD  saccharomyces boulardii (FLORASTOR) 250 MG capsule Take 250 mg by mouth 3 (three) times a week.    [provider]      Allergies    Ciprofloxacin, Wasp venom, and Fluconazole    Review of Systems   Review of Systems  Physical Exam Updated Vital Signs BP (!) 169/71 (BP Location: Right Arm)   Pulse (!) 52   Temp 98 F (36.7 C) (Oral)   Resp 18   Ht 5\' 4"  (1.626 m)   Wt 74.8 kg   SpO2 96%   BMI 28.32 kg/m  Physical Exam Constitutional:      General: She is not in acute distress.    Appearance: Normal appearance. She is not ill-appearing.  HENT:     Head: Normocephalic.  Comments: Small abrasion to left forehead    Right Ear: External ear normal.     Left Ear: External ear normal.     Mouth/Throat:     Mouth: Mucous membranes are moist.     Pharynx: Oropharynx is clear.  Eyes:     Extraocular Movements: Extraocular movements intact.     Conjunctiva/sclera: Conjunctivae normal.     Pupils: Pupils are equal, round, and reactive to light.  Neck:     Comments: No C-spine midline tenderness to palpation Cardiovascular:     Rate and Rhythm: Normal rate and regular rhythm.     Pulses: Normal pulses.     Heart sounds: Normal heart sounds.  Pulmonary:     Effort: Pulmonary effort is normal. No respiratory distress.     Breath sounds: Normal breath sounds.  Abdominal:     General: Abdomen is flat.     Palpations: Abdomen is soft.     Tenderness:  There is no abdominal tenderness. There is no guarding.  Musculoskeletal:        General: No deformity. Normal range of motion.     Cervical back: No rigidity or tenderness.     Comments: No tenderness to palpation of midline thoracic or lumbar spine.  No step-offs palpated.  No tenderness to palpation of chest wall.  No bruising noted.  No tenderness to palpation of bilateral clavicles.  No tenderness to palpation, bruising, or deformities noted of bilateral shoulders, elbows, wrists, hips, knees, or ankles.  Neurological:     General: No focal deficit present.     Mental Status: She is alert and oriented to person, place, and time. Mental status is at baseline.     Cranial Nerves: No cranial nerve deficit.     Sensory: No sensory deficit.     Motor: No weakness.     ED Results / Procedures / Treatments   Labs (all labs ordered are listed, but only abnormal results are displayed) Labs Reviewed - No data to display  EKG None  Radiology CT Head Wo Contrast  Result Date: 03/03/2023 CLINICAL DATA:  Head trauma. Unwitnessed fall. Injury to LEFT side of forehead. EXAM: CT HEAD WITHOUT CONTRAST TECHNIQUE: Contiguous axial images were obtained from the base of the skull through the vertex without intravenous contrast. RADIATION DOSE REDUCTION: This exam was performed according to the departmental dose-optimization program which includes automated exposure control, adjustment of the mA and/or kV according to patient size and/or use of iterative reconstruction technique. COMPARISON:  Head CT dated 03/13/2021. FINDINGS: Brain: Ventricles are stable in size and configuration. Mild chronic small vessel ischemic changes noted within the bilateral periventricular white matter regions. No mass, hemorrhage, edema or other evidence of acute parenchymal abnormality. No extra-axial hemorrhage. Vascular: Chronic calcified atherosclerotic changes of the large vessels at the skull base. No unexpected  hyperdense vessel. Skull: Normal. Negative for fracture or focal lesion. Sinuses/Orbits: No acute finding. Other: No scalp hematoma. IMPRESSION: 1. No acute findings. No intracranial mass, hemorrhage or edema. No skull fracture. 2. Mild chronic small vessel ischemic changes in the white matter. Electronically Signed   By: Bary Richard M.D.   On: 03/03/2023 08:23    Procedures Procedures    Medications Ordered in ED Medications - No data to display  ED Course/ Medical Decision Making/ A&P                             Medical Decision Making  Amount and/or Complexity of Data Reviewed Radiology: ordered.   Linda Lee is a 87 y.o. female with comorbidities that complicate the patient evaluation including pulmonary embolism on [facility] who presents with head trauma after a fall  Initial Ddx:  TBI, c-spine injury, mechanical fall, syncope   MDM:  Concerned about TBI given the patient's fall and the fact that they are on blood thinners will obtain a head CT at this time.  No neck pain or C-spine tenderness palpation so we will hold off on CT of the C-spine.  Appears that this was a result of mechanical fall and did not have any preceding symptoms that would suggest syncope.  No other signs of trauma at this time.  Plan:  CT head  ED Summary/Re-evaluation:  No acute injuries were found on the patient's imaging.  Return precautions discussed prior to discharge.  Instructed her to take Tylenol for any pain that she should have.  This patient presents to the ED for concern of complaints listed in HPI, this involves an extensive number of treatment options, and is a complaint that carries with it a high risk of complications and morbidity. Disposition including potential need for admission considered.   Dispo: DC Home. Return precautions discussed including, but not limited to, those listed in the AVS. Allowed pt time to ask questions which were answered fully prior to dc.  Records reviewed  Outpatient Clinic Notes I independently reviewed the following imaging with scope of interpretation limited to determining acute life threatening conditions related to emergency care: CT Head and agree with the radiologist interpretation with the following exceptions: none I have reviewed the patients home medications and made adjustments as needed Social Determinants of health:  Elderly         Final Clinical Impression(s) / ED Diagnoses Final diagnoses:  Fall, initial encounter  Minor head injury, initial encounter  Current use of long term anticoagulation    Rx / DC Orders ED Discharge Orders     None         Rondel Baton, MD 03/03/23 [phone]

## 2023-03-06 ENCOUNTER — Other Ambulatory Visit: Payer: Self-pay

## 2023-03-06 ENCOUNTER — Emergency Department (HOSPITAL_BASED_OUTPATIENT_CLINIC_OR_DEPARTMENT_OTHER): Payer: Medicare PPO

## 2023-03-06 ENCOUNTER — Emergency Department (HOSPITAL_BASED_OUTPATIENT_CLINIC_OR_DEPARTMENT_OTHER)
Admission: EM | Admit: 2023-03-06 | Discharge: 2023-03-06 | Disposition: A | Discharge status: Home / Self Care | Payer: Medicare PPO | Attending: Emergency Medicine | Admitting: Emergency Medicine

## 2023-03-06 ENCOUNTER — Encounter (HOSPITAL_BASED_OUTPATIENT_CLINIC_OR_DEPARTMENT_OTHER): Payer: Self-pay | Admitting: Radiology

## 2023-03-06 DIAGNOSIS — Z043 Encounter for examination and observation following other accident: Secondary | ICD-10-CM | POA: Diagnosis not present

## 2023-03-06 DIAGNOSIS — Z7901 Long term (current) use of anticoagulants: Secondary | ICD-10-CM | POA: Diagnosis not present

## 2023-03-06 DIAGNOSIS — M47812 Spondylosis without myelopathy or radiculopathy, cervical region: Secondary | ICD-10-CM | POA: Diagnosis not present

## 2023-03-06 DIAGNOSIS — D72819 Decreased white blood cell count, unspecified: Secondary | ICD-10-CM | POA: Insufficient documentation

## 2023-03-06 DIAGNOSIS — H538 Other visual disturbances: Secondary | ICD-10-CM | POA: Diagnosis not present

## 2023-03-06 DIAGNOSIS — D649 Anemia, unspecified: Secondary | ICD-10-CM | POA: Diagnosis not present

## 2023-03-06 DIAGNOSIS — I771 Stricture of artery: Secondary | ICD-10-CM | POA: Diagnosis not present

## 2023-03-06 DIAGNOSIS — I672 Cerebral atherosclerosis: Secondary | ICD-10-CM | POA: Diagnosis not present

## 2023-03-06 DIAGNOSIS — D696 Thrombocytopenia, unspecified: Secondary | ICD-10-CM | POA: Diagnosis not present

## 2023-03-06 DIAGNOSIS — R519 Headache, unspecified: Secondary | ICD-10-CM | POA: Diagnosis not present

## 2023-03-06 DIAGNOSIS — M8580 Other specified disorders of bone density and structure, unspecified site: Secondary | ICD-10-CM | POA: Diagnosis not present

## 2023-03-06 DIAGNOSIS — R209 Unspecified disturbances of skin sensation: Secondary | ICD-10-CM | POA: Diagnosis not present

## 2023-03-06 DIAGNOSIS — R479 Unspecified speech disturbances: Secondary | ICD-10-CM | POA: Diagnosis not present

## 2023-03-06 LAB — BASIC METABOLIC PANEL
Anion gap: 9 (ref 5–15)
BUN: 23 mg/dL (ref 8–23)
CO2: 26 mmol/L (ref 22–32)
Calcium: 8.3 mg/dL — ABNORMAL LOW (ref 8.9–10.3)
Chloride: 106 mmol/L (ref 98–111)
Creatinine, Ser: 0.88 mg/dL (ref 0.44–1.00)
GFR, Estimated: >60 mL/min (ref >60)
Glucose, Bld: 91 mg/dL (ref 70–99)
Potassium: 4.3 mmol/L (ref 3.5–5.1)
Sodium: 141 mmol/L (ref 135–145)

## 2023-03-06 LAB — CBC
HCT: 35.9 % — ABNORMAL LOW (ref 36.0–46.0)
Hemoglobin: 11.7 g/dL — ABNORMAL LOW (ref 12.0–15.0)
MCH: 31.2 pg (ref 26.0–34.0)
MCHC: 32.6 g/dL (ref 30.0–36.0)
MCV: 95.7 fL (ref 80.0–100.0)
Platelets: 126 10*3/uL — ABNORMAL LOW (ref 150–400)
RBC: 3.75 MIL/uL — ABNORMAL LOW (ref 3.87–5.11)
RDW: 13.2 % (ref 11.5–15.5)
WBC: 3.5 10*3/uL — ABNORMAL LOW (ref 4.0–10.5)
nRBC: 0 % (ref 0.0–0.2)

## 2023-03-06 MED ORDER — IOHEXOL 350 MG/ML SOLN
100.0000 mL | Freq: Once | INTRAVENOUS | Status: AC | PRN
  Administered 2023-03-06: 75 mL via INTRAVENOUS

## 2023-03-06 MED ORDER — ACETAMINOPHEN 500 MG PO TABS
1000.0000 mg | ORAL_TABLET | Freq: Once | ORAL | Status: AC
  Administered 2023-03-06: 1000 mg via ORAL
  Filled 2023-03-06: qty 2

## 2023-03-06 NOTE — Discharge Instructions (Signed)
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  You should call your spine surgeon about this.  If you develop severe headache, persistent difficulty with speech, persistent weakness or numbness or vision changes you should return to the ED.  You should also follow-up with your primary care provider and neurologist.

## 2023-03-06 NOTE — ED Provider Notes (Signed)
[place] Provider Note   CSN: [id number] Arrival date & time: 03/06/23  1058     History  Chief Complaint  Patient presents with   Headache    Linda Lee is a 87 y.o. female.   Headache 87 year old female history of PE on Eliquis presenting for abnormal headache.  Patient states on 1 day she had mechanical fall when she was walking her dog.  She was seen here, had a CT scan of her head and was sent home.  However since then she has had some intermittent numbness in both hands.  She also notes today she woke up and had a complex migraine.  She said she has had this a few times before, and today had a approximately 10-minute episode of difficulty speaking.  This is since resolved.  She also reports she occasionally has some vision changes in her right eye associated with her migraines.  She otherwise feels well.  Her headache is resolved, she has no current speech difficulties, vision changes, weakness, numbness.  She has no neck pain or radicular symptoms.  No chest pain or belly pain.  No shortness of breath.  No fevers or chills.     Home Medications Prior to Admission medications   Medication Sig Start Date End Date Taking? Authorizing Provider  apixaban (ELIQUIS) 2.5 MG TABS tablet Take 1 tablet (2.5 mg total) by mouth 2 (two) times daily. 01/22/23   Cobb, Ruby Cola, NP  atorvastatin (LIPITOR) 20 MG tablet Take 1 tablet (20 mg total) by mouth daily after supper. 08/11/18   Mesner, Barbara Cower, MD  carbidopa-levodopa (SINEMET CR) 50-200 MG tablet Take 1 tablet by mouth at bedtime. 02/13/23   Tat, Octaviano Batty, DO  carbidopa-levodopa (SINEMET IR) 25-100 MG tablet Take 1.5 tablets by mouth 3 (three) times daily. 02/13/23   Tat, Octaviano Batty, DO  CRANBERRY PO Take 300 mg daily by mouth.    [provider]  Cyanocobalamin (VITAMIN B-12 PO) Take 1,200 mcg daily by mouth.     [provider]  docusate sodium (COLACE) 100 MG  capsule Take 100 mg by mouth daily as needed for mild constipation.    [provider]  EPINEPHrine 0.3 mg/0.3 mL IJ SOAJ injection Inject 0.3 mg into the muscle once as needed for up to 1 dose (anaphylaxis). 02/07/21   Nehemiah Settle, FNP  Ergocalciferol (VITAMIN D2) 2000 units TABS Take 2,000 Units daily by mouth.     [provider]  furosemide (LASIX) 20 MG tablet Take 20 mg by mouth every Monday, Wednesday, and Friday.    [provider]  KLOR-CON M10 10 MEQ tablet Take 10 mEq by mouth 3 (three) times a week. 05/11/19   [provider]  omeprazole (PRILOSEC) 40 MG capsule Take 40 mg by mouth continuous as needed (indigestion). 06/15/18   [provider]  propranolol ER (INDERAL LA) 80 MG 24 hr capsule TAKE 1 CAPSULE(80 MG) BY MOUTH DAILY Patient taking differently: Take 80 mg by mouth daily. 05/15/21   Yates Decamp, MD  saccharomyces boulardii (FLORASTOR) 250 MG capsule Take 250 mg by mouth 3 (three) times a week.    [provider]      Allergies    Ciprofloxacin, Wasp venom, and Fluconazole    Review of Systems   Review of Systems  Neurological:  Positive for headaches.  Review of systems completed and notable as per HPI.  ROS otherwise negative.   Physical Exam Updated  Vital Signs BP (!) 166/70   Pulse (!) 57   Temp 98 F (36.7 C)   Resp 17   SpO2 98%  Physical Exam Vitals and nursing note reviewed.  Constitutional:      General: She is not in acute distress.    Appearance: She is well-developed.  HENT:     Head: Normocephalic and atraumatic.  Eyes:     General: No visual field deficit.    Extraocular Movements: Extraocular movements intact.     Conjunctiva/sclera: Conjunctivae normal.     Pupils: Pupils are equal, round, and reactive to light.  Cardiovascular:     Rate and Rhythm: Normal rate and regular rhythm.     Heart sounds: No murmur heard. Pulmonary:     Effort: Pulmonary effort is normal. No respiratory  distress.     Breath sounds: Normal breath sounds.  Abdominal:     Palpations: Abdomen is soft.     Tenderness: There is no abdominal tenderness.  Musculoskeletal:        General: No swelling.     Cervical back: Neck supple. No rigidity.  Lymphadenopathy:     Cervical: No cervical adenopathy.  Skin:    General: Skin is warm and dry.     Capillary Refill: Capillary refill takes less than 2 seconds.  Neurological:     Mental Status: She is alert and oriented to person, place, and time.     GCS: GCS eye subscore is 4. GCS verbal subscore is 5. GCS motor subscore is 6.     Cranial Nerves: No cranial nerve deficit, dysarthria or facial asymmetry.     Sensory: No sensory deficit.     Motor: No weakness.     Coordination: Romberg sign negative. Coordination normal.     Gait: Gait normal.     Deep Tendon Reflexes: Reflexes normal.     Comments: Normal speech.  Psychiatric:        Mood and Affect: Mood normal.     ED Results / Procedures / Treatments   Labs (all labs ordered are listed, but only abnormal results are displayed) Labs Reviewed  CBC - Abnormal; Notable for the following components:      Result Value   WBC 3.5 (*)    RBC 3.75 (*)    Hemoglobin 11.7 (*)    HCT 35.9 (*)    Platelets 126 (*)    All other components within normal limits  BASIC METABOLIC PANEL - Abnormal; Notable for the following components:   Calcium 8.3 (*)    All other components within normal limits    EKG None  Radiology CT C-SPINE NO CHARGE  Result Date: 03/06/2023 CLINICAL DATA:  Fall. EXAM: CT CERVICAL SPINE WITHOUT CONTRAST TECHNIQUE: Multidetector CT imaging of the cervical spine was performed without intravenous contrast. Multiplanar CT image reconstructions were also generated. RADIATION DOSE REDUCTION: This exam was performed according to the departmental dose-optimization program which includes automated exposure control, adjustment of the mA and/or kV according to patient size and/or  use of iterative reconstruction technique. COMPARISON:  None Available. FINDINGS: Alignment: Mild retrolisthesis of C5 on C6, probably degenerative given severe degenerative changes at this level. Skull base and vertebrae: No acute fracture. Vertebral body heights are maintained. Osteopenia. Soft tissues and spinal canal: No prevertebral fluid or swelling. No visible canal hematoma. Disc levels: Multilevel degenerative change including facet uncovertebral hypertrophy with probably severe foraminal stenosis on the left at C5-C6 and C6-C7. Upper chest: Not imaged. IMPRESSION: 1. No  evidence of acute fracture or traumatic malalignment. 2. Probably severe foraminal stenosis on the left at C5-C6 and C6-C7. An MRI could further characterize if clinically warranted. 3. Osteopenia. Electronically Signed   By: Feliberto Harts M.D.   On: 03/06/2023 14:37   CT ANGIO HEAD NECK W WO CM  Result Date: 03/06/2023 CLINICAL DATA:  Atypical headache with speech problem. EXAM: CT ANGIOGRAPHY HEAD AND NECK WITH AND WITHOUT CONTRAST TECHNIQUE: Multidetector CT imaging of the head and neck was performed using the standard protocol during bolus administration of intravenous contrast. Multiplanar CT image reconstructions and MIPs were obtained to evaluate the vascular anatomy. Carotid stenosis measurements (when applicable) are obtained utilizing NASCET criteria, using the distal internal carotid diameter as the denominator. RADIATION DOSE REDUCTION: This exam was performed according to the departmental dose-optimization program which includes automated exposure control, adjustment of the mA and/or kV according to patient size and/or use of iterative reconstruction technique. CONTRAST:  75mL OMNIPAQUE IOHEXOL 350 MG/ML SOLN COMPARISON:  None Available. FINDINGS: CT HEAD FINDINGS Brain: No evidence of acute infarction, hemorrhage, hydrocephalus, extra-axial collection or mass lesion/mass effect. Low-density in the cerebral white matter  attributed to chronic small vessel ischemia, mild. Mild cerebral volume loss. Vascular: No hyperdense vessel or unexpected calcification. Skull: Normal. Negative for fracture or focal lesion. Sinuses/Orbits: No acute finding. Review of the MIP images confirms the above findings CTA NECK FINDINGS Aortic arch: Atheromatous plaque.  Three vessel branching. Right carotid system: Limited atheromatous plaque especially for age. No stenosis or ulceration Left carotid system: Less than typical atheromatous plaque. No stenosis or ulceration Vertebral arteries: Proximal subclavian calcified plaque. The vertebral arteries are somewhat tortuous but smoothly contoured and widely patent to the dura. Skeleton: No acute or aggressive finding. Other neck: No acute finding Upper chest: Clear apical lungs Review of the MIP images confirms the above findings CTA HEAD FINDINGS Anterior circulation: No significant stenosis, proximal occlusion, aneurysm, or vascular malformation. Posterior circulation: No significant stenosis, proximal occlusion, aneurysm, or vascular malformation. Venous sinuses: Diffusely patent Anatomic variants: None significant Review of the MIP images confirms the above findings IMPRESSION: 1. No emergent finding or explanation for symptoms. 2. Less than typical atherosclerosis for age. No stenosis or irregularity of major arteries in the head and neck. Electronically Signed   By: Tiburcio Pea M.D.   On: 03/06/2023 14:16    Procedures Procedures    Medications Ordered in ED Medications  iohexol (OMNIPAQUE) 350 MG/ML injection 100 mL (75 mLs Intravenous Contrast Given 03/06/23 1249)  acetaminophen (TYLENOL) tablet 1,000 mg (1,000 mg Oral Given 03/06/23 1418)    ED Course/ Medical Decision Making/ A&P                                 Medical Decision Making Amount and/or Complexity of Data Reviewed Labs: ordered. Radiology: ordered.  Risk OTC drugs. Prescription drug management.   Medical  Decision Making:   SHIREEN DOTY is a 87 y.o. female who presented to the ED today atypical headache, speech difficulty.  Patient reports recent fall, and since has had occasional numbness in her ulnar aspect of her hands which has resolved as well as an episode today of headache associate with speech difficulty.  She reports history of prior complex migraine, the symptoms have resolved but are concerning.  She is no deficits now, no indication for code stroke.  Differential including complex migraine, TIA.  Also consider possible cervical  radiculopathy given recent fall although no neck pain.  Will obtain CT of the cervical spine as well as CT head and neck to evaluate.   Patient placed on continuous vitals and telemetry monitoring while in ED which was reviewed periodically.  Reviewed and confirmed nursing documentation for past medical history, family history, social history.  Initial Study Results:   Laboratory  All laboratory results reviewed.  Labs notable for stable anemia, thrombocytopenia.  Mild leukopenia.  BMP unremarkable.  Radiology:  All images reviewed independently. Agree with radiology report at this time.      Consults: Case discussed with neurology.   Reassessment and Plan:   On reassessment she remains asymptomatic.  No numbness or headache or speech difficulties.  CTA without signs of aneurysm, LVO, or acute hemorrhage.  CT cervical spine shows likely severe foraminal stenosis which could be causing her intermittent left hand numbness.  However she is currently asymptomatic and has great strength.  I do not think she needs emergent MRI at this time.  I discussed patient with Dr. Selina Cooley with neurology.  I reviewed her prior note and is noted she has had complex migraine with aphasia before.  Dr. Selina Cooley felt was reasonable to have her follow-up outpatient given similar episodes.  No indication for MRI at this time, low concern for stroke.  Patient was very comfortable this,  she would like to go home.  Given strict return precautions for any new or worsening symptoms.  Also recommend she call her spine doctor regarding her foraminal stenosis.   Patient's presentation is most consistent with acute presentation with potential threat to life or bodily function.           Final Clinical Impression(s) / ED Diagnoses Final diagnoses:  Acute nonintractable headache, unspecified headache type    Rx / DC Orders ED Discharge Orders     None         Laurence Spates, MD 03/06/23 1622

## 2023-03-06 NOTE — ED Triage Notes (Signed)
Patient presents to ED via POV from home. Here with headache and intermittent bilateral hand numbness. Patient had a fall on Monday. Seen here at that time with a negative CT scan per patient. GCS 15. Ambulatory. Well appearing.

## 2023-03-14 DIAGNOSIS — M1712 Unilateral primary osteoarthritis, left knee: Secondary | ICD-10-CM | POA: Diagnosis not present

## 2023-03-18 DIAGNOSIS — Z91030 Bee allergy status: Secondary | ICD-10-CM | POA: Diagnosis not present

## 2023-03-18 DIAGNOSIS — E785 Hyperlipidemia, unspecified: Secondary | ICD-10-CM | POA: Diagnosis not present

## 2023-03-18 DIAGNOSIS — G43109 Migraine with aura, not intractable, without status migrainosus: Secondary | ICD-10-CM | POA: Diagnosis not present

## 2023-03-18 DIAGNOSIS — I10 Essential (primary) hypertension: Secondary | ICD-10-CM | POA: Diagnosis not present

## 2023-03-18 DIAGNOSIS — G629 Polyneuropathy, unspecified: Secondary | ICD-10-CM | POA: Diagnosis not present

## 2023-03-18 DIAGNOSIS — I2699 Other pulmonary embolism without acute cor pulmonale: Secondary | ICD-10-CM | POA: Diagnosis not present

## 2023-03-18 DIAGNOSIS — G20A1 Parkinson's disease without dyskinesia, without mention of fluctuations: Secondary | ICD-10-CM | POA: Diagnosis not present

## 2023-03-18 DIAGNOSIS — M8580 Other specified disorders of bone density and structure, unspecified site: Secondary | ICD-10-CM | POA: Diagnosis not present

## 2023-03-19 ENCOUNTER — Other Ambulatory Visit (HOSPITAL_COMMUNITY): Payer: Self-pay | Admitting: *Deleted

## 2023-03-20 ENCOUNTER — Ambulatory Visit (HOSPITAL_COMMUNITY)
Admission: RE | Admit: 2023-03-20 | Discharge: 2023-03-20 | Disposition: A | Discharge status: Home / Self Care | Payer: Medicare PPO | Source: Ambulatory Visit | Attending: Internal Medicine | Admitting: Internal Medicine

## 2023-03-20 DIAGNOSIS — M810 Age-related osteoporosis without current pathological fracture: Secondary | ICD-10-CM | POA: Diagnosis not present

## 2023-03-20 MED ORDER — DENOSUMAB 60 MG/ML ~~LOC~~ SOSY
60.0000 mg | PREFILLED_SYRINGE | Freq: Once | SUBCUTANEOUS | Status: AC
  Administered 2023-03-20: 60 mg via SUBCUTANEOUS

## 2023-03-20 MED ORDER — DENOSUMAB 60 MG/ML ~~LOC~~ SOSY
PREFILLED_SYRINGE | SUBCUTANEOUS | Status: AC
  Filled 2023-03-20: qty 1

## 2023-04-09 ENCOUNTER — Ambulatory Visit: Payer: Medicare PPO | Attending: Internal Medicine

## 2023-04-09 ENCOUNTER — Other Ambulatory Visit: Payer: Self-pay

## 2023-04-09 DIAGNOSIS — M25562 Pain in left knee: Secondary | ICD-10-CM | POA: Diagnosis not present

## 2023-04-09 DIAGNOSIS — R262 Difficulty in walking, not elsewhere classified: Secondary | ICD-10-CM | POA: Diagnosis not present

## 2023-04-09 DIAGNOSIS — R293 Abnormal posture: Secondary | ICD-10-CM | POA: Insufficient documentation

## 2023-04-09 DIAGNOSIS — R2689 Other abnormalities of gait and mobility: Secondary | ICD-10-CM | POA: Diagnosis not present

## 2023-04-09 DIAGNOSIS — R278 Other lack of coordination: Secondary | ICD-10-CM | POA: Diagnosis not present

## 2023-04-09 DIAGNOSIS — G8929 Other chronic pain: Secondary | ICD-10-CM | POA: Insufficient documentation

## 2023-04-09 DIAGNOSIS — S76319A Strain of muscle, fascia and tendon of the posterior muscle group at thigh level, unspecified thigh, initial encounter: Secondary | ICD-10-CM | POA: Insufficient documentation

## 2023-04-09 DIAGNOSIS — M6281 Muscle weakness (generalized): Secondary | ICD-10-CM | POA: Diagnosis not present

## 2023-04-09 DIAGNOSIS — R2681 Unsteadiness on feet: Secondary | ICD-10-CM | POA: Diagnosis not present

## 2023-04-09 NOTE — Therapy (Signed)
OUTPATIENT PHYSICAL THERAPY NEURO EVALUATION   Patient Name: Linda Lee MRN: [id number] DOB:Jan 13, 1936, 87 y.o., female Today's Date: 04/09/2023   PCP: Rodrigo Ran REFERRING PROVIDER: same  END OF SESSION:  PT End of Session - 04/09/23 1708     Visit Number 1    Date for PT Re-Evaluation 07/02/23    PT Start Time 1605    PT Stop Time 1700    PT Time Calculation (min) 55 min    Activity Tolerance Patient tolerated treatment well;Patient limited by pain    Behavior During Therapy [facility] for tasks assessed/performed             Past Medical History:  Diagnosis Date   Anginal pain (HCC)    Angio-edema    Arthritis    Atypical chest pain 07/06/2009   Qualifier: Diagnosis of  By: Clifton James, MD, Christopher     GERD (gastroesophageal reflux disease)    Hyperlipemia    Migraines    Toxic effect of venom of bees, unintentional 06/03/2017   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     denies surgery for this   TONSILLECTOMY     TOTAL KNEE ARTHROPLASTY Right 09/25/2015   Procedure: TOTAL KNEE ARTHROPLASTY;  Surgeon: Ollen Gross, MD;  Location: [facility];  Service: Orthopedics;  Laterality: Right;   Patient Active Problem List   Diagnosis Date Noted   Fatigue 04/29/2022   Osteoporosis 02/21/2022   Lung infiltrate 02/11/2022   Acute pulmonary embolism (HCC) 01/19/2022   Elevated troponin level not due myocardial infarction 01/19/2022   Asymptomatic bacteriuria 01/19/2022   Nonsustained ventricular tachycardia (HCC) 10/06/2018   Migraine 10/06/2018   History of TIA (transient ischemic attack) 10/05/2018   Coronary artery calcification seen on  CT scan 10/05/2018   Parkinson disease 12/10/2017   Carpal tunnel syndrome of left wrist 12/08/2017   Ulnar neuropathy 12/08/2017   Dyspnea on exertion 06/17/2017   OA (osteoarthritis) of knee 09/25/2015   UTI (lower urinary tract infection) 09/04/2012   Diastolic CHF, chronic (HCC) 09/04/2012   Subjective visual disturbance, right eye 09/03/2012   Vision disturbance 09/03/2012   Ascending aortic aneurysm (HCC) 07/06/2009   Mixed hyperlipidemia 06/30/2009   GERD without esophagitis 06/30/2009   LEG CRAMPS 06/30/2009    ONSET DATE: chronic  REFERRING DIAG: Parkinsons, chronic LBP, L knee OA  THERAPY DIAG:  Difficulty in walking, not elsewhere classified  Hamstring strain, unspecified laterality, initial encounter  Balance problem  Chronic pain of left knee  Abnormal posture  Rationale for Evaluation and Treatment: Rehabilitation  SUBJECTIVE:  SUBJECTIVE STATEMENT: My hamstrings and hips hurt when I walk.  Saw Dr Waylan Rocher for L knee and he gave me a shot which didn't help my knee much Pt accompanied by: self  PERTINENT HISTORY: progressive loss of function, patient reports pain in post thighs and gluteals, L knee is the most limiting factor.  She reports difficulty making it through the grocery store, difficulty walking into the facility where her husband is housed, due to pain B legs, has to sit down and rest frequently  PAIN:  Are you having pain? Yes: NPRS scale: 0 to 7/10 Pain location: L knee B gluts, post thighs Pain description: constant  Aggravating factors: walking, also hurts to turn over Relieving factors: sitting ,   PRECAUTIONS: Fall  RED FLAGS: None   WEIGHT BEARING RESTRICTIONS: No  FALLS: Has patient fallen in last 6 months? Yes. Number of falls 3 or more  LIVING  ENVIRONMENT: Lives with: lives alone Lives in: House/apartment Stairs: No Has following equipment at home: Single point cane  PLOF: Independent with basic ADLs, Independent with household mobility with device, Independent with gait, Independent with transfers, and Needs assistance with homemaking  PATIENT GOALS: be able to walk, get groceries more easily  OBJECTIVE:   DIAGNOSTIC FINDINGS: na  COGNITION: Overall cognitive status: Within functional limits for tasks assessed   SENSATION: WFL  COORDINATION: Symmetrical foot tapping Did not pill rolling, tremor B hands, greater on R  MUSCLE LENGTH: Hamstrings: Right -40 deg; Left -45 deg Prone knee flexion: Right 90 deg; Left 90 deg both with p! Ant thighs  DTRs: NT   POSTURE: flexed through thoracic spine and trunk, flexed B hips, weight bearing primarily on B forefeet  LOWER EXTREMITY ROM:   ALL ROM LE's wfl but myofascial and muscular tightness B LE's  LOWER EXTREMITY MMT:    MMT Right Eval Left Eval  Hip flexion 4 3+  Hip extension bridging Unable  P! Unable, p!  Hip abduction 4 3+  Hip adduction    Hip internal rotation    Hip external rotation    Knee flexion 4- 4-  Knee extension 4 4  Ankle dorsiflexion    Ankle plantarflexion    Ankle inversion    Ankle eversion    (Blank rows = not tested)  BED MOBILITY:  Slowed, with all bed mobility, needed assistance with supine to sit   GAIT: Gait pattern: decreased arm swing- Right, decreased arm swing- Left, decreased step length- Right, decreased step length- Left, decreased stride length, knee flexed in stance- Right, knee flexed in stance- Left, and shuffling Distance walked: 54' within clinic Assistive device utilized: Single point cane Level of assistance: Modified independence Comments: slowed speed noted  FUNCTIONAL TESTS:  Berg Balance Scale: 34/56  PATIENT SURVEYS:  Modified Oswestry 31/50   TODAY'S TREATMENT:  DATE: 04/09/23: Eval, Prone for cross friction massage B hamstrings and gluteals with massage gun, poor tolerance L hamstrings.   Instructed in seated hamstring stretch, using cane to dorsiflex ankle and hinging forward at hips.   PATIENT EDUCATION: Education details: POC, goals Person educated: Patient Education method: Explanation, Demonstration, Tactile cues, Verbal cues, and Handouts Education comprehension: verbalized understanding, returned demonstration, and verbal cues required  HOME EXERCISE PROGRAM: Seated hamstring stretch  GOALS: Goals reviewed with patient? Yes  SHORT TERM GOALS: Target date: 2 weeks, 04/23/23  I HEP Baseline: Goal status: INITIAL  LONG TERM GOALS: Target date: 07/02/23  Improve Berg balance from 34/56 to 45/56 or greater to reduce fall risk Baseline:  Goal status: INITIAL  2.  modified oswestry improve from 31/ 50 to 18/50 Baseline:  Goal status: INITIAL  3.  Hamstrings flexibility improve to -20 B for improved tolerance of standing, walking, Baseline: -35R, -40 L Goal status: INITIAL  4.  Gluteal strength, improve so that pt may perform 10 bridges , to improve bed mobility and posture in standing Baseline: unable to bridge due to hamstring pain, glut pain, weakness Goal status: INITIAL   ASSESSMENT:  CLINICAL IMPRESSION: Patient is a 87 y.o. female who was evaluated by physical therapy due to decline in her ability to walk and tolerate standing, due to lower back, B hamstring pain, L knee pain. She also has experienced one fall recently and scored 34/56 on the Sharlene Motts which places her in a high fall risk category.  She does have multiple diagnoses which are contributing to this decline, parkinsons as well as advanced arthritis L knee.  Noted today when performing the Berg testing that her pain increased in severity Legs and lower back with repeated static  standing.  She will benefit from skilled PT to address her deficits and help her with improving and sustaining her function.   OBJECTIVE IMPAIRMENTS: decreased activity tolerance, decreased balance, decreased endurance, decreased mobility, difficulty walking, decreased ROM, decreased strength, increased fascial restrictions, postural dysfunction, and pain.   ACTIVITY LIMITATIONS: carrying, lifting, standing, squatting, sleeping, stairs, transfers, bed mobility, and locomotion level  PARTICIPATION LIMITATIONS: meal prep, cleaning, laundry, shopping, and community activity  PERSONAL FACTORS: Age, Behavior pattern, Fitness, Past/current experiences, Time since onset of injury/illness/exacerbation, and 3+ comorbidities: parkinsons, L knee OA, chronic LBP  are also affecting patient's functional outcome.   REHAB POTENTIAL: Fair    CLINICAL DECISION MAKING: Evolving/moderate complexity  EVALUATION COMPLEXITY: Moderate  PLAN:  PT FREQUENCY: 2x/week  PT DURATION: 12 weeks  PLANNED INTERVENTIONS: Therapeutic exercises, Therapeutic activity, Neuromuscular re-education, Balance training, Gait training, Patient/Family education, Self Care, and Joint mobilization  PLAN FOR NEXT SESSION: gentle manual techniques and modalities as needed for pain B hamstrings and gluteals, also stretching B hamstrings and hip flexors, initiate strengthening, cardiovascular equipment   Jayln Branscom L Abel Ra, PT, DPT, OCS 04/09/2023, 5:17 PM

## 2023-04-15 NOTE — Therapy (Signed)
OUTPATIENT PHYSICAL THERAPY NEURO TREATMENT   Patient Name: Linda Lee MRN: [id number] DOB:[birth year], 87 y.o., female Today's Date: 04/16/2023   PCP: Rodrigo Ran REFERRING PROVIDER: same  END OF SESSION:  PT End of Session - 04/16/23 1314     Visit Number 2    Date for PT Re-Evaluation 07/02/23    Authorization Type [employer]    PT Start Time 1315    PT Stop Time 1400    PT Time Calculation (min) 45 min    Activity Tolerance Patient tolerated treatment well;Patient limited by pain    Behavior During Therapy [facility] for tasks assessed/performed              Past Medical History:  Diagnosis Date   Anginal pain (HCC)    Angio-edema    Arthritis    Atypical chest pain 07/06/2009   Qualifier: Diagnosis of  By: Clifton James, MD, Christopher     GERD (gastroesophageal reflux disease)    Hyperlipemia    Migraines    Toxic effect of venom of bees, unintentional 06/03/2017   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     denies surgery for this   TONSILLECTOMY     TOTAL KNEE ARTHROPLASTY Right 09/25/2015   Procedure: TOTAL KNEE ARTHROPLASTY;  Surgeon: Ollen Gross, MD;  Location: [facility];  Service: Orthopedics;  Laterality: Right;   Patient Active Problem List   Diagnosis Date Noted   Fatigue 04/29/2022   Osteoporosis 02/21/2022   Lung infiltrate 02/11/2022   Acute pulmonary embolism (HCC) 01/19/2022   Elevated troponin level not due myocardial infarction 01/19/2022   Asymptomatic bacteriuria 01/19/2022   Nonsustained ventricular tachycardia (HCC) 10/06/2018   Migraine 10/06/2018   History of TIA (transient ischemic attack) 10/05/2018    Coronary artery calcification seen on CT scan 10/05/2018   Parkinson disease 12/10/2017   Carpal tunnel syndrome of left wrist 12/08/2017   Ulnar neuropathy 12/08/2017   Dyspnea on exertion 06/17/2017   OA (osteoarthritis) of knee 09/25/2015   UTI (lower urinary tract infection) 09/04/2012   Diastolic CHF, chronic (HCC) 09/04/2012   Subjective visual disturbance, right eye 09/03/2012   Vision disturbance 09/03/2012   Ascending aortic aneurysm (HCC) 07/06/2009   Mixed hyperlipidemia 06/30/2009   GERD without esophagitis 06/30/2009   LEG CRAMPS 06/30/2009    ONSET DATE: chronic  REFERRING DIAG: Parkinsons, chronic LBP, L knee OA  THERAPY DIAG:  Difficulty in walking, not elsewhere classified  Hamstring strain, unspecified laterality, initial encounter  Balance problem  Chronic pain of left knee  Abnormal posture  Unsteadiness on feet  Muscle weakness (generalized)  Other lack of coordination  Rationale for Evaluation and Treatment: Rehabilitation  SUBJECTIVE:  SUBJECTIVE STATEMENT: My hamstrings are killing me. It hurts all the way up my back Pt accompanied by: self  PERTINENT HISTORY: progressive loss of function, patient reports pain in post thighs and gluteals, L knee is the most limiting factor.  She reports difficulty making it through the grocery store, difficulty walking into the facility where her husband is housed, due to pain B legs, has to sit down and rest frequently  PAIN:  Are you having pain? Yes: NPRS scale: 10/10 Pain location: L knee B gluts, post thighs Pain description: constant  Aggravating factors: walking, also hurts to turn over Relieving factors: sitting ,   PRECAUTIONS: Fall  RED FLAGS: None   WEIGHT BEARING RESTRICTIONS: No  FALLS: Has patient  fallen in last 6 months? Yes. Number of falls 3 or more  LIVING ENVIRONMENT: Lives with: lives alone Lives in: House/apartment Stairs: No Has following equipment at home: Single point cane  PLOF: Independent with basic ADLs, Independent with household mobility with device, Independent with gait, Independent with transfers, and Needs assistance with homemaking  PATIENT GOALS: be able to walk, get groceries more easily  OBJECTIVE:   DIAGNOSTIC FINDINGS: na  COGNITION: Overall cognitive status: Within functional limits for tasks assessed   SENSATION: WFL  COORDINATION: Symmetrical foot tapping Did not pill rolling, tremor B hands, greater on R  MUSCLE LENGTH: Hamstrings: Right -40 deg; Left -45 deg Prone knee flexion: Right 90 deg; Left 90 deg both with p! Ant thighs  DTRs: NT   POSTURE: flexed through thoracic spine and trunk, flexed B hips, weight bearing primarily on B forefeet  LOWER EXTREMITY ROM:   ALL ROM LE's wfl but myofascial and muscular tightness B LE's  LOWER EXTREMITY MMT:    MMT Right Eval Left Eval  Hip flexion 4 3+  Hip extension bridging Unable  P! Unable, p!  Hip abduction 4 3+  Hip adduction    Hip internal rotation    Hip external rotation    Knee flexion 4- 4-  Knee extension 4 4  Ankle dorsiflexion    Ankle plantarflexion    Ankle inversion    Ankle eversion    (Blank rows = not tested)  BED MOBILITY:  Slowed, with all bed mobility, needed assistance with supine to sit   GAIT: Gait pattern: decreased arm swing- Right, decreased arm swing- Left, decreased step length- Right, decreased step length- Left, decreased stride length, knee flexed in stance- Right, knee flexed in stance- Left, and shuffling Distance walked: 62' within clinic Assistive device utilized: Single point cane Level of assistance: Modified independence Comments: slowed speed noted  FUNCTIONAL TESTS:  Berg Balance Scale: 34/56  PATIENT SURVEYS:  Modified  Oswestry 31/50   TODAY'S TREATMENT:                                                                                                                              DATE:  04/16/23 NuStep L5 x68mins  Calf  stretch on slant 30s  Seated HS stretch and piriformis 30s  Supine HS stretch with strap 30s  Glute stretching 30s Small bridges x10 Feet on ball trunk rotations and knees to chest x10 STS 2x10  Box taps 4" with SPC Step ups 4" at stairs- CGA/minA x10 RLE, x5 LLE   04/09/23: Eval, Prone for cross friction massage B hamstrings and gluteals with massage gun, poor tolerance L hamstrings.   Instructed in seated hamstring stretch, using cane to dorsiflex ankle and hinging forward at hips.   PATIENT EDUCATION: Education details: POC, goals Person educated: Patient Education method: Explanation, Demonstration, Tactile cues, Verbal cues, and Handouts Education comprehension: verbalized understanding, returned demonstration, and verbal cues required  HOME EXERCISE PROGRAM: Seated hamstring stretch  GOALS: Goals reviewed with patient? Yes  SHORT TERM GOALS: Target date: 2 weeks, 04/23/23  I HEP Baseline: Goal status: INITIAL  LONG TERM GOALS: Target date: 07/02/23  Improve Berg balance from 34/56 to 45/56 or greater to reduce fall risk Baseline:  Goal status: INITIAL  2.  modified oswestry improve from 31/ 50 to 18/50 Baseline:  Goal status: INITIAL  3.  Hamstrings flexibility improve to -20 B for improved tolerance of standing, walking, Baseline: -35R, -40 L Goal status: INITIAL  4.  Gluteal strength, improve so that pt may perform 10 bridges , to improve bed mobility and posture in standing Baseline: unable to bridge due to hamstring pain, glut pain, weakness Goal status: INITIAL   ASSESSMENT:  CLINICAL IMPRESSION: Patient is a 87 y.o. female who was seen in physical therapy due to decline in her ability to walk and tolerate standing, due to lower back, B hamstring pain,  L knee pain. She reports her hamstrings are really hurting today. Started with some light stretching to target her pain areas. Reports some pain in LLE when stepping up, so we only did 5 reps. She will benefit from skilled PT to address her deficits and help her with improving and sustaining her function.    OBJECTIVE IMPAIRMENTS: decreased activity tolerance, decreased balance, decreased endurance, decreased mobility, difficulty walking, decreased ROM, decreased strength, increased fascial restrictions, postural dysfunction, and pain.   ACTIVITY LIMITATIONS: carrying, lifting, standing, squatting, sleeping, stairs, transfers, bed mobility, and locomotion level  PARTICIPATION LIMITATIONS: meal prep, cleaning, laundry, shopping, and community activity  PERSONAL FACTORS: Age, Behavior pattern, Fitness, Past/current experiences, Time since onset of injury/illness/exacerbation, and 3+ comorbidities: parkinsons, L knee OA, chronic LBP  are also affecting patient's functional outcome.   REHAB POTENTIAL: Fair    CLINICAL DECISION MAKING: Evolving/moderate complexity  EVALUATION COMPLEXITY: Moderate  PLAN:  PT FREQUENCY: 2x/week  PT DURATION: 12 weeks  PLANNED INTERVENTIONS: Therapeutic exercises, Therapeutic activity, Neuromuscular re-education, Balance training, Gait training, Patient/Family education, Self Care, and Joint mobilization  PLAN FOR NEXT SESSION: gentle manual techniques and modalities as needed for pain B hamstrings and gluteals, also stretching B hamstrings and hip flexors, initiate strengthening, cardiovascular equipment   Cassie Freer, PT, DPT 04/16/2023, 1:58 PM

## 2023-04-16 ENCOUNTER — Ambulatory Visit: Payer: Medicare PPO

## 2023-04-16 DIAGNOSIS — S76319A Strain of muscle, fascia and tendon of the posterior muscle group at thigh level, unspecified thigh, initial encounter: Secondary | ICD-10-CM

## 2023-04-16 DIAGNOSIS — G8929 Other chronic pain: Secondary | ICD-10-CM

## 2023-04-16 DIAGNOSIS — R2689 Other abnormalities of gait and mobility: Secondary | ICD-10-CM | POA: Diagnosis not present

## 2023-04-16 DIAGNOSIS — M6281 Muscle weakness (generalized): Secondary | ICD-10-CM

## 2023-04-16 DIAGNOSIS — R2681 Unsteadiness on feet: Secondary | ICD-10-CM | POA: Diagnosis not present

## 2023-04-16 DIAGNOSIS — R262 Difficulty in walking, not elsewhere classified: Secondary | ICD-10-CM

## 2023-04-16 DIAGNOSIS — M25562 Pain in left knee: Secondary | ICD-10-CM | POA: Diagnosis not present

## 2023-04-16 DIAGNOSIS — R293 Abnormal posture: Secondary | ICD-10-CM | POA: Diagnosis not present

## 2023-04-16 DIAGNOSIS — R278 Other lack of coordination: Secondary | ICD-10-CM

## 2023-04-21 ENCOUNTER — Ambulatory Visit: Payer: Medicare PPO | Admitting: Physical Therapy

## 2023-04-23 ENCOUNTER — Ambulatory Visit: Payer: Medicare PPO | Admitting: Physical Therapy

## 2023-04-23 ENCOUNTER — Ambulatory Visit: Payer: Medicare PPO

## 2023-04-24 ENCOUNTER — Ambulatory Visit: Payer: Medicare PPO

## 2023-04-28 ENCOUNTER — Ambulatory Visit: Payer: Medicare PPO

## 2023-04-30 ENCOUNTER — Ambulatory Visit: Payer: Medicare PPO

## 2023-05-05 ENCOUNTER — Ambulatory Visit: Payer: Medicare PPO

## 2023-05-08 ENCOUNTER — Encounter: Payer: Self-pay | Admitting: Physical Therapy

## 2023-05-08 ENCOUNTER — Ambulatory Visit: Payer: Medicare PPO | Attending: Internal Medicine | Admitting: Physical Therapy

## 2023-05-08 DIAGNOSIS — M6281 Muscle weakness (generalized): Secondary | ICD-10-CM | POA: Diagnosis not present

## 2023-05-08 DIAGNOSIS — R293 Abnormal posture: Secondary | ICD-10-CM | POA: Insufficient documentation

## 2023-05-08 DIAGNOSIS — R278 Other lack of coordination: Secondary | ICD-10-CM | POA: Insufficient documentation

## 2023-05-08 DIAGNOSIS — R262 Difficulty in walking, not elsewhere classified: Secondary | ICD-10-CM | POA: Diagnosis not present

## 2023-05-08 DIAGNOSIS — S76319A Strain of muscle, fascia and tendon of the posterior muscle group at thigh level, unspecified thigh, initial encounter: Secondary | ICD-10-CM | POA: Diagnosis not present

## 2023-05-08 DIAGNOSIS — G8929 Other chronic pain: Secondary | ICD-10-CM | POA: Insufficient documentation

## 2023-05-08 DIAGNOSIS — R2689 Other abnormalities of gait and mobility: Secondary | ICD-10-CM | POA: Insufficient documentation

## 2023-05-08 DIAGNOSIS — R2681 Unsteadiness on feet: Secondary | ICD-10-CM | POA: Insufficient documentation

## 2023-05-08 DIAGNOSIS — M25562 Pain in left knee: Secondary | ICD-10-CM | POA: Insufficient documentation

## 2023-05-08 NOTE — Therapy (Signed)
OUTPATIENT PHYSICAL THERAPY NEURO TREATMENT   Patient Name: Linda Lee MRN: [id number] DOB:June 30, 1936, 87 y.o., female Today's Date: 05/08/2023  PCP: Rodrigo Ran REFERRING PROVIDER: same  END OF SESSION:  PT End of Session - 05/08/23 1439     Visit Number 3    Date for PT Re-Evaluation 07/02/23    PT Start Time 1445    PT Stop Time 1525    PT Time Calculation (min) 40 min    Activity Tolerance Patient tolerated treatment well;Patient limited by pain    Behavior During Therapy [facility] for tasks assessed/performed            Past Medical History:  Diagnosis Date   Anginal pain (HCC)    Angio-edema    Arthritis    Atypical chest pain 07/06/2009   Qualifier: Diagnosis of  By: Clifton James, MD, Christopher     GERD (gastroesophageal reflux disease)    Hyperlipemia    Migraines    Toxic effect of venom of bees, unintentional 06/03/2017   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     denies surgery for this   TONSILLECTOMY     TOTAL KNEE ARTHROPLASTY Right 09/25/2015   Procedure: TOTAL KNEE ARTHROPLASTY;  Surgeon: Ollen Gross, MD;  Location: [facility];  Service: Orthopedics;  Laterality: Right;   Patient Active Problem List   Diagnosis Date Noted   Fatigue 04/29/2022   Osteoporosis 02/21/2022   Lung infiltrate 02/11/2022   Acute pulmonary embolism (HCC) 01/19/2022   Elevated troponin level not due myocardial infarction 01/19/2022   Asymptomatic bacteriuria 01/19/2022   Nonsustained ventricular tachycardia (HCC) 10/06/2018   Migraine 10/06/2018   History of TIA (transient ischemic attack) 10/05/2018   Coronary artery calcification seen on CT  scan 10/05/2018   Parkinson disease (HCC) 12/10/2017   Carpal tunnel syndrome of left wrist 12/08/2017   Ulnar neuropathy 12/08/2017   Dyspnea on exertion 06/17/2017   OA (osteoarthritis) of knee 09/25/2015   Lower urinary tract infectious disease 09/04/2012   Diastolic CHF, chronic (HCC) 09/04/2012   Subjective visual disturbance, right eye 09/03/2012   Vision disturbance 09/03/2012   Ascending aortic aneurysm (HCC) 07/06/2009   Mixed hyperlipidemia 06/30/2009   GERD without esophagitis 06/30/2009   LEG CRAMPS 06/30/2009    ONSET DATE: chronic  REFERRING DIAG: Parkinsons, chronic LBP, L knee OA  THERAPY DIAG:  Difficulty in walking, not elsewhere classified  Hamstring strain, unspecified laterality, initial encounter  Balance problem  Chronic pain of left knee  Abnormal posture  Muscle weakness (generalized)  Rationale for Evaluation and Treatment: Rehabilitation  SUBJECTIVE:  SUBJECTIVE STATEMENT: Patient reports that her knee pain continues and she also has back pain and poor balance. Pt accompanied by: self  PERTINENT HISTORY: progressive loss of function, patient reports pain in post thighs and gluteals, L knee is the most limiting factor.  She reports difficulty making it through the grocery store, difficulty walking into the facility where her husband is housed, due to pain B legs, has to sit down and rest frequently  PAIN:  Are you having pain? Yes: NPRS scale: 10/10 Pain location: L knee B gluts, post thighs Pain description: constant  Aggravating factors: walking, also hurts to turn over Relieving factors: sitting ,   PRECAUTIONS: Fall  RED FLAGS: None   WEIGHT BEARING RESTRICTIONS: No  FALLS: Has patient fallen in last 6 months? Yes. Number of falls 3 or  more  LIVING ENVIRONMENT: Lives with: lives alone Lives in: House/apartment Stairs: No Has following equipment at home: Single point cane  PLOF: Independent with basic ADLs, Independent with household mobility with device, Independent with gait, Independent with transfers, and Needs assistance with homemaking  PATIENT GOALS: be able to walk, get groceries more easily  OBJECTIVE:   DIAGNOSTIC FINDINGS: na  COGNITION: Overall cognitive status: Within functional limits for tasks assessed   SENSATION: WFL  COORDINATION: Symmetrical foot tapping Did not pill rolling, tremor B hands, greater on R  MUSCLE LENGTH: Hamstrings: Right -40 deg; Left -45 deg Prone knee flexion: Right 90 deg; Left 90 deg both with p! Ant thighs  DTRs: NT   POSTURE: flexed through thoracic spine and trunk, flexed B hips, weight bearing primarily on B forefeet  LOWER EXTREMITY ROM:   ALL ROM LE's wfl but myofascial and muscular tightness B LE's  LOWER EXTREMITY MMT:    MMT Right Eval Left Eval  Hip flexion 4 3+  Hip extension bridging Unable  P! Unable, p!  Hip abduction 4 3+  Hip adduction    Hip internal rotation    Hip external rotation    Knee flexion 4- 4-  Knee extension 4 4  Ankle dorsiflexion    Ankle plantarflexion    Ankle inversion    Ankle eversion    (Blank rows = not tested)  BED MOBILITY:  Slowed, with all bed mobility, needed assistance with supine to sit   GAIT: Gait pattern: decreased arm swing- Right, decreased arm swing- Left, decreased step length- Right, decreased step length- Left, decreased stride length, knee flexed in stance- Right, knee flexed in stance- Left, and shuffling Distance walked: 15' within clinic Assistive device utilized: Single point cane Level of assistance: Modified independence Comments: slowed speed noted  FUNCTIONAL TESTS:  Berg Balance Scale: 34/56  PATIENT SURVEYS:  Modified Oswestry 31/50   TODAY'S TREATMENT:                                                                                                                               DATE:  05/08/23 NuStep L5  x 6 minutes Supine stretch with physioball-figure 4, gluts, LTR, x 5 each side Supine strength with physioball- bridge, 10, quick roll side to side 20 Supine SLR, SLR with hip ER Seated HS and calf stretch Standing tandem weight shifts at counter, R and then L leg forward  05/05/23 NuStep Supine LE stretches LAQ HS curls Ball squeezes Hip abd green    04/16/23 NuStep L5 x21mins  Calf stretch on slant 30s  Seated HS stretch and piriformis 30s  Supine HS stretch with strap 30s  Glute stretching 30s Small bridges x10 Feet on ball trunk rotations and knees to chest x10 STS 2x10  Box taps 4" with SPC Step ups 4" at stairs- CGA/minA x10 RLE, x5 LLE   04/09/23: Eval, Prone for cross friction massage B hamstrings and gluteals with massage gun, poor tolerance L hamstrings.   Instructed in seated hamstring stretch, using cane to dorsiflex ankle and hinging forward at hips.   PATIENT EDUCATION: Education details: POC, goals Person educated: Patient Education method: Explanation, Demonstration, Tactile cues, Verbal cues, and Handouts Education comprehension: verbalized understanding, returned demonstration, and verbal cues required  HOME EXERCISE PROGRAM: Seated hamstring stretch  [id number]  GOALS: Goals reviewed with patient? Yes  SHORT TERM GOALS: Target date: 2 weeks, 04/23/23  I HEP Baseline: Goal status: 05/3023 Provided, ongoing  LONG TERM GOALS: Target date: 07/02/23  Improve Berg balance from 34/56 to 45/56 or greater to reduce fall risk Baseline:  Goal status: INITIAL  2.  modified oswestry improve from 31/ 50 to 18/50 Baseline:  Goal status: INITIAL  3.  Hamstrings flexibility improve to -20 B for improved tolerance of standing, walking, Baseline: -35R, -40 L Goal status: INITIAL  4.  Gluteal strength, improve so that pt may  perform 10 bridges , to improve bed mobility and posture in standing Baseline: unable to bridge due to hamstring pain, glut pain, weakness Goal status: INITIAL   ASSESSMENT:  CLINICAL IMPRESSION: Patient is a 87 y.o. female who was seen in physical therapy due to decline in her ability to walk and tolerate standing, due to lower back, B hamstring pain, L knee pain. Provided education and practice for multiple stretches and strengthening exercises, adding them to her home program. She tolerated all well, felt they helped with pain today. Will try at home and report response.  OBJECTIVE IMPAIRMENTS: decreased activity tolerance, decreased balance, decreased endurance, decreased mobility, difficulty walking, decreased ROM, decreased strength, increased fascial restrictions, postural dysfunction, and pain.   ACTIVITY LIMITATIONS: carrying, lifting, standing, squatting, sleeping, stairs, transfers, bed mobility, and locomotion level  PARTICIPATION LIMITATIONS: meal prep, cleaning, laundry, shopping, and community activity  PERSONAL FACTORS: Age, Behavior pattern, Fitness, Past/current experiences, Time since onset of injury/illness/exacerbation, and 3+ comorbidities: parkinsons, L knee OA, chronic LBP  are also affecting patient's functional outcome.   REHAB POTENTIAL: Fair    CLINICAL DECISION MAKING: Evolving/moderate complexity  EVALUATION COMPLEXITY: Moderate  PLAN:  PT FREQUENCY: 2x/week  PT DURATION: 12 weeks  PLANNED INTERVENTIONS: Therapeutic exercises, Therapeutic activity, Neuromuscular re-education, Balance training, Gait training, Patient/Family education, Self Care, and Joint mobilization  PLAN FOR NEXT SESSION: gentle manual techniques and modalities as needed for pain B hamstrings and gluteals, also stretching B hamstrings and hip flexors, initiate strengthening, cardiovascular equipment   Iona Beard, PT, DPT 05/08/2023, 3:32 PM

## 2023-05-12 ENCOUNTER — Ambulatory Visit: Payer: Medicare PPO | Admitting: Physical Therapy

## 2023-05-12 ENCOUNTER — Encounter: Payer: Self-pay | Admitting: Physical Therapy

## 2023-05-12 DIAGNOSIS — R278 Other lack of coordination: Secondary | ICD-10-CM | POA: Diagnosis not present

## 2023-05-12 DIAGNOSIS — R262 Difficulty in walking, not elsewhere classified: Secondary | ICD-10-CM | POA: Diagnosis not present

## 2023-05-12 DIAGNOSIS — R293 Abnormal posture: Secondary | ICD-10-CM | POA: Diagnosis not present

## 2023-05-12 DIAGNOSIS — S76319A Strain of muscle, fascia and tendon of the posterior muscle group at thigh level, unspecified thigh, initial encounter: Secondary | ICD-10-CM

## 2023-05-12 DIAGNOSIS — G8929 Other chronic pain: Secondary | ICD-10-CM | POA: Diagnosis not present

## 2023-05-12 DIAGNOSIS — R2681 Unsteadiness on feet: Secondary | ICD-10-CM

## 2023-05-12 DIAGNOSIS — M25562 Pain in left knee: Secondary | ICD-10-CM | POA: Diagnosis not present

## 2023-05-12 DIAGNOSIS — M6281 Muscle weakness (generalized): Secondary | ICD-10-CM

## 2023-05-12 DIAGNOSIS — R2689 Other abnormalities of gait and mobility: Secondary | ICD-10-CM | POA: Diagnosis not present

## 2023-05-12 NOTE — Therapy (Signed)
OUTPATIENT PHYSICAL THERAPY NEURO TREATMENT   Patient Name: Linda Lee MRN: [id number] DOB:Nov 05, 1935, 87 y.o., female Today's Date: 05/12/2023  PCP: Rodrigo Ran REFERRING PROVIDER: same  END OF SESSION:  PT End of Session - 05/12/23 1501     Visit Number 4    Date for PT Re-Evaluation 07/02/23    PT Start Time 1500    PT Stop Time 1540    PT Time Calculation (min) 40 min    Activity Tolerance Patient tolerated treatment well;Patient limited by pain    Behavior During Therapy [facility] for tasks assessed/performed             Past Medical History:  Diagnosis Date   Anginal pain (HCC)    Angio-edema    Arthritis    Atypical chest pain 07/06/2009   Qualifier: Diagnosis of  By: Clifton James, MD, Christopher     GERD (gastroesophageal reflux disease)    Hyperlipemia    Migraines    Toxic effect of venom of bees, unintentional 06/03/2017   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     denies surgery for this   TONSILLECTOMY     TOTAL KNEE ARTHROPLASTY Right 09/25/2015   Procedure: TOTAL KNEE ARTHROPLASTY;  Surgeon: Ollen Gross, MD;  Location: [facility];  Service: Orthopedics;  Laterality: Right;   Patient Active Problem List   Diagnosis Date Noted   Fatigue 04/29/2022   Osteoporosis 02/21/2022   Lung infiltrate 02/11/2022   Acute pulmonary embolism (HCC) 01/19/2022   Elevated troponin level not due myocardial infarction 01/19/2022   Asymptomatic bacteriuria 01/19/2022   Nonsustained ventricular tachycardia (HCC) 10/06/2018   Migraine 10/06/2018   History of TIA (transient ischemic attack) 10/05/2018   Coronary artery calcification seen on CT  scan 10/05/2018   Parkinson disease (HCC) 12/10/2017   Carpal tunnel syndrome of left wrist 12/08/2017   Ulnar neuropathy 12/08/2017   Dyspnea on exertion 06/17/2017   OA (osteoarthritis) of knee 09/25/2015   Lower urinary tract infectious disease 09/04/2012   Diastolic CHF, chronic (HCC) 09/04/2012   Subjective visual disturbance, right eye 09/03/2012   Vision disturbance 09/03/2012   Ascending aortic aneurysm (HCC) 07/06/2009   Mixed hyperlipidemia 06/30/2009   GERD without esophagitis 06/30/2009   LEG CRAMPS 06/30/2009    ONSET DATE: chronic  REFERRING DIAG: Parkinsons, chronic LBP, L knee OA  THERAPY DIAG:  Difficulty in walking, not elsewhere classified  Hamstring strain, unspecified laterality, initial encounter  Balance problem  Chronic pain of left knee  Abnormal posture  Muscle weakness (generalized)  Unsteadiness on feet  Other lack of coordination  Rationale for Evaluation and Treatment: Rehabilitation  SUBJECTIVE:  SUBJECTIVE STATEMENT: Patient reports that her pain is increased because she had to move some items today. Pt accompanied by: self  PERTINENT HISTORY: progressive loss of function, patient reports pain in post thighs and gluteals, L knee is the most limiting factor.  She reports difficulty making it through the grocery store, difficulty walking into the facility where her husband is housed, due to pain B legs, has to sit down and rest frequently  PAIN:  Are you having pain? Yes: NPRS scale: 10/10 Pain location: L knee B gluts, post thighs Pain description: constant  Aggravating factors: walking, also hurts to turn over Relieving factors: sitting ,   PRECAUTIONS: Fall  RED FLAGS: None   WEIGHT BEARING RESTRICTIONS: No  FALLS: Has patient fallen in  last 6 months? Yes. Number of falls 3 or more  LIVING ENVIRONMENT: Lives with: lives alone Lives in: House/apartment Stairs: No Has following equipment at home: Single point cane  PLOF: Independent with basic ADLs, Independent with household mobility with device, Independent with gait, Independent with transfers, and Needs assistance with homemaking  PATIENT GOALS: be able to walk, get groceries more easily  OBJECTIVE:   DIAGNOSTIC FINDINGS: na  COGNITION: Overall cognitive status: Within functional limits for tasks assessed   SENSATION: WFL  COORDINATION: Symmetrical foot tapping Did not pill rolling, tremor B hands, greater on R  MUSCLE LENGTH: Hamstrings: Right -40 deg; Left -45 deg Prone knee flexion: Right 90 deg; Left 90 deg both with p! Ant thighs  DTRs: NT   POSTURE: flexed through thoracic spine and trunk, flexed B hips, weight bearing primarily on B forefeet  LOWER EXTREMITY ROM:   ALL ROM LE's wfl but myofascial and muscular tightness B LE's  LOWER EXTREMITY MMT:    MMT Right Eval Left Eval  Hip flexion 4 3+  Hip extension bridging Unable  P! Unable, p!  Hip abduction 4 3+  Hip adduction    Hip internal rotation    Hip external rotation    Knee flexion 4- 4-  Knee extension 4 4  Ankle dorsiflexion    Ankle plantarflexion    Ankle inversion    Ankle eversion    (Blank rows = not tested)  BED MOBILITY:  Slowed, with all bed mobility, needed assistance with supine to sit   GAIT: Gait pattern: decreased arm swing- Right, decreased arm swing- Left, decreased step length- Right, decreased step length- Left, decreased stride length, knee flexed in stance- Right, knee flexed in stance- Left, and shuffling Distance walked: 36' within clinic Assistive device utilized: Single point cane Level of assistance: Modified independence Comments: slowed speed noted  FUNCTIONAL TESTS:  Berg Balance Scale: 34/56  PATIENT SURVEYS:  Modified Oswestry 31/50    TODAY'S TREATMENT:                                                                                                                              DATE:  05/12/23 NuStep L3 x  6 minutes, then educated in intervals using arms to push, then pull, then legs to pedal with rest breaks Supine LE stretching with strap, HS, abd, add x 30 sec with each leg-Unable to complete with RLE due to strain in back SKTC x 30 sec each side. DKTC x 30 sec Passive stretch to low back, lateral hip, ITB, piriformis, deep STM to R gluts Seated rotational weight shifts with B hands to the R, therapist facilitating activation and then stretch, repeat x 3 to each side. MH to low back and gluts x 10 min for pain.  05/08/23 NuStep L5 x 6 minutes Supine stretch with physioball-figure 4, gluts, LTR, x 5 each side Supine strength with physioball- bridge, 10, quick roll side to side 20 Supine SLR, SLR with hip ER Seated HS and calf stretch Standing tandem weight shifts at counter, R and then L leg forward  05/05/23 NuStep Supine LE stretches LAQ HS curls Ball squeezes Hip abd green   04/16/23 NuStep L5 x62mins  Calf stretch on slant 30s  Seated HS stretch and piriformis 30s  Supine HS stretch with strap 30s  Glute stretching 30s Small bridges x10 Feet on ball trunk rotations and knees to chest x10 STS 2x10  Box taps 4" with SPC Step ups 4" at stairs- CGA/minA x10 RLE, x5 LLE   04/09/23: Eval, Prone for cross friction massage B hamstrings and gluteals with massage gun, poor tolerance L hamstrings.   Instructed in seated hamstring stretch, using cane to dorsiflex ankle and hinging forward at hips.   PATIENT EDUCATION: Education details: POC, goals Person educated: Patient Education method: Explanation, Demonstration, Tactile cues, Verbal cues, and Handouts Education comprehension: verbalized understanding, returned demonstration, and verbal cues required  HOME EXERCISE PROGRAM: Seated hamstring stretch   [id number]  GOALS: Goals reviewed with patient? Yes  SHORT TERM GOALS: Target date: 2 weeks, 04/23/23  I HEP Baseline: Goal status: 05/08/23 Provided, ongoing  LONG TERM GOALS: Target date: 07/02/23  Improve Berg balance from 34/56 to 45/56 or greater to reduce fall risk Baseline:  Goal status: INITIAL  2.  modified oswestry improve from 31/ 50 to 18/50 Baseline:  Goal status: INITIAL  3.  Hamstrings flexibility improve to -20 B for improved tolerance of standing, walking, Baseline: -35R, -40 L Goal status: INITIAL  4.  Gluteal strength, improve so that pt may perform 10 bridges , to improve bed mobility and posture in standing Baseline: unable to bridge due to hamstring pain, glut pain, weakness Goal status: INITIAL   ASSESSMENT:  CLINICAL IMPRESSION: Patient is a 87 y.o. female who was seen in physical therapy due to decline in her ability to walk and tolerate standing, due to lower back, B hamstring pain, L knee pain. Provided education and practice for multiple stretches and strengthening exercises, adding them to her home program. She tolerated all well, felt they helped with pain today. Will try at home and report response.  OBJECTIVE IMPAIRMENTS: decreased activity tolerance, decreased balance, decreased endurance, decreased mobility, difficulty walking, decreased ROM, decreased strength, increased fascial restrictions, postural dysfunction, and pain.   ACTIVITY LIMITATIONS: carrying, lifting, standing, squatting, sleeping, stairs, transfers, bed mobility, and locomotion level  PARTICIPATION LIMITATIONS: meal prep, cleaning, laundry, shopping, and community activity  PERSONAL FACTORS: Age, Behavior pattern, Fitness, Past/current experiences, Time since onset of injury/illness/exacerbation, and 3+ comorbidities: parkinsons, L knee OA, chronic LBP  are also affecting patient's functional outcome.   REHAB POTENTIAL: Fair    CLINICAL DECISION MAKING: Evolving/moderate  complexity  EVALUATION  COMPLEXITY: Moderate  PLAN:  PT FREQUENCY: 2x/week  PT DURATION: 12 weeks  PLANNED INTERVENTIONS: Therapeutic exercises, Therapeutic activity, Neuromuscular re-education, Balance training, Gait training, Patient/Family education, Self Care, and Joint mobilization  PLAN FOR NEXT SESSION: gentle manual techniques and modalities as needed for pain B hamstrings and gluteals, also stretching B hamstrings and hip flexors, initiate strengthening, cardiovascular equipment 10/7-STM to R gluts   Iona Beard, PT, DPT 05/12/2023, 3:46 PM

## 2023-05-15 ENCOUNTER — Ambulatory Visit: Payer: Medicare PPO | Admitting: Physical Therapy

## 2023-05-15 ENCOUNTER — Encounter: Payer: Self-pay | Admitting: Physical Therapy

## 2023-05-15 DIAGNOSIS — R262 Difficulty in walking, not elsewhere classified: Secondary | ICD-10-CM

## 2023-05-15 DIAGNOSIS — R293 Abnormal posture: Secondary | ICD-10-CM | POA: Diagnosis not present

## 2023-05-15 DIAGNOSIS — G8929 Other chronic pain: Secondary | ICD-10-CM

## 2023-05-15 DIAGNOSIS — M6281 Muscle weakness (generalized): Secondary | ICD-10-CM | POA: Diagnosis not present

## 2023-05-15 DIAGNOSIS — M25562 Pain in left knee: Secondary | ICD-10-CM | POA: Diagnosis not present

## 2023-05-15 DIAGNOSIS — R2689 Other abnormalities of gait and mobility: Secondary | ICD-10-CM

## 2023-05-15 DIAGNOSIS — S76319A Strain of muscle, fascia and tendon of the posterior muscle group at thigh level, unspecified thigh, initial encounter: Secondary | ICD-10-CM

## 2023-05-15 DIAGNOSIS — R2681 Unsteadiness on feet: Secondary | ICD-10-CM | POA: Diagnosis not present

## 2023-05-15 DIAGNOSIS — R278 Other lack of coordination: Secondary | ICD-10-CM | POA: Diagnosis not present

## 2023-05-15 NOTE — Therapy (Signed)
OUTPATIENT PHYSICAL THERAPY NEURO TREATMENT   Patient Name: Linda Lee MRN: [id number] DOB:[birth year], 87 y.o., female Today's Date: 05/15/2023  PCP: Rodrigo Ran REFERRING PROVIDER: same  END OF SESSION:  PT End of Session - 05/15/23 1448     Visit Number 5    Date for PT Re-Evaluation 07/02/23    PT Start Time 1446    PT Stop Time 1528    PT Time Calculation (min) 42 min    Activity Tolerance Patient tolerated treatment well;Patient limited by pain    Behavior During Therapy [facility] for tasks assessed/performed            Past Medical History:  Diagnosis Date   Anginal pain (HCC)    Angio-edema    Arthritis    Atypical chest pain 07/06/2009   Qualifier: Diagnosis of  By: Clifton James, MD, Christopher     GERD (gastroesophageal reflux disease)    Hyperlipemia    Migraines    Toxic effect of venom of bees, unintentional 06/03/2017   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     denies surgery for this   TONSILLECTOMY     TOTAL KNEE ARTHROPLASTY Right 09/25/2015   Procedure: TOTAL KNEE ARTHROPLASTY;  Surgeon: Ollen Gross, MD;  Location: [facility];  Service: Orthopedics;  Laterality: Right;   Patient Active Problem List   Diagnosis Date Noted   Fatigue 04/29/2022   Osteoporosis 02/21/2022   Lung infiltrate 02/11/2022   Acute pulmonary embolism (HCC) 01/19/2022   Elevated troponin level not due myocardial infarction 01/19/2022   Asymptomatic bacteriuria 01/19/2022   Nonsustained ventricular tachycardia (HCC) 10/06/2018   Migraine 10/06/2018   History of TIA (transient ischemic attack) 10/05/2018   Coronary artery calcification seen on CT  scan 10/05/2018   Parkinson disease (HCC) 12/10/2017   Carpal tunnel syndrome of left wrist 12/08/2017   Ulnar neuropathy 12/08/2017   Dyspnea on exertion 06/17/2017   OA (osteoarthritis) of knee 09/25/2015   Lower urinary tract infectious disease 09/04/2012   Diastolic CHF, chronic (HCC) 09/04/2012   Subjective visual disturbance, right eye 09/03/2012   Vision disturbance 09/03/2012   Ascending aortic aneurysm (HCC) 07/06/2009   Mixed hyperlipidemia 06/30/2009   GERD without esophagitis 06/30/2009   LEG CRAMPS 06/30/2009    ONSET DATE: chronic  REFERRING DIAG: Parkinsons, chronic LBP, L knee OA  THERAPY DIAG:  Difficulty in walking, not elsewhere classified  Hamstring strain, unspecified laterality, initial encounter  Balance problem  Chronic pain of left knee  Abnormal posture  Muscle weakness (generalized)  Rationale for Evaluation and Treatment: Rehabilitation  SUBJECTIVE:  SUBJECTIVE STATEMENT: Patient feels somewhat better today.  Pt accompanied by: self  PERTINENT HISTORY: progressive loss of function, patient reports pain in post thighs and gluteals, L knee is the most limiting factor.  She reports difficulty making it through the grocery store, difficulty walking into the facility where her husband is housed, due to pain B legs, has to sit down and rest frequently  PAIN:  Are you having pain? Yes: NPRS scale: 10/10 Pain location: L knee B gluts, post thighs Pain description: constant  Aggravating factors: walking, also hurts to turn over Relieving factors: sitting ,   PRECAUTIONS: Fall  RED FLAGS: None   WEIGHT BEARING RESTRICTIONS: No  FALLS: Has patient fallen in last 6 months? Yes. Number of falls 3 or more  LIVING ENVIRONMENT: Lives with: lives alone Lives  in: House/apartment Stairs: No Has following equipment at home: Single point cane  PLOF: Independent with basic ADLs, Independent with household mobility with device, Independent with gait, Independent with transfers, and Needs assistance with homemaking  PATIENT GOALS: be able to walk, get groceries more easily  OBJECTIVE:   DIAGNOSTIC FINDINGS: na  COGNITION: Overall cognitive status: Within functional limits for tasks assessed   SENSATION: WFL  COORDINATION: Symmetrical foot tapping Did not pill rolling, tremor B hands, greater on R  MUSCLE LENGTH: Hamstrings: Right -40 deg; Left -45 deg Prone knee flexion: Right 90 deg; Left 90 deg both with p! Ant thighs  DTRs: NT   POSTURE: flexed through thoracic spine and trunk, flexed B hips, weight bearing primarily on B forefeet  LOWER EXTREMITY ROM:   ALL ROM LE's wfl but myofascial and muscular tightness B LE's  LOWER EXTREMITY MMT:    MMT Right Eval Left Eval  Hip flexion 4 3+  Hip extension bridging Unable  P! Unable, p!  Hip abduction 4 3+  Hip adduction    Hip internal rotation    Hip external rotation    Knee flexion 4- 4-  Knee extension 4 4  Ankle dorsiflexion    Ankle plantarflexion    Ankle inversion    Ankle eversion    (Blank rows = not tested)  BED MOBILITY:  Slowed, with all bed mobility, needed assistance with supine to sit   GAIT: Gait pattern: decreased arm swing- Right, decreased arm swing- Left, decreased step length- Right, decreased step length- Left, decreased stride length, knee flexed in stance- Right, knee flexed in stance- Left, and shuffling Distance walked: 66' within clinic Assistive device utilized: Single point cane Level of assistance: Modified independence Comments: slowed speed noted  FUNCTIONAL TESTS:  Berg Balance Scale: 34/56  PATIENT SURVEYS:  Modified Oswestry 31/50   TODAY'S TREATMENT:                                                                                                                               DATE:  05/15/23 NuStep L5 x 6 minutes Seated rotational weight shifts to each side  x 4 Seated HS stretch 3 x 15 sec each leg Seated press ups on side of mat for postural control, 10 x 5 sec Supine stretching with legs over physioball, DKTC, LTR, figure 4 stretch Supine strengthening over physioball- Quick small side to side ball roll to engage abdominals, bridging, alternating leg lift off ball while stabilizing with opposite leg.  05/12/23 NuStep L3 x 6 minutes, then educated in intervals using arms to push, then pull, then legs to pedal with rest breaks Supine LE stretching with strap, HS, abd, add x 30 sec with each leg-Unable to complete with RLE due to strain in back SKTC x 30 sec each side. DKTC x 30 sec Passive stretch to low back, lateral hip, ITB, piriformis, deep STM to R gluts Seated rotational weight shifts with B hands to the R, therapist facilitating activation and then stretch, repeat x 3 to each side. MH to low back and gluts x 10 min for pain.  05/08/23 NuStep L5 x 6 minutes Supine stretch with physioball-figure 4, gluts, LTR, x 5 each side Supine strength with physioball- bridge, 10, quick roll side to side 20 Supine SLR, SLR with hip ER Seated HS and calf stretch Standing tandem weight shifts at counter, R and then L leg forward  05/05/23 NuStep Supine LE stretches LAQ HS curls Ball squeezes Hip abd green   04/16/23 NuStep L5 x76mins  Calf stretch on slant 30s  Seated HS stretch and piriformis 30s  Supine HS stretch with strap 30s  Glute stretching 30s Small bridges x10 Feet on ball trunk rotations and knees to chest x10 STS 2x10  Box taps 4" with SPC Step ups 4" at stairs- CGA/minA x10 RLE, x5 LLE   04/09/23: Eval, Prone for cross friction massage B hamstrings and gluteals with massage gun, poor tolerance L hamstrings.   Instructed in seated hamstring stretch, using cane to dorsiflex ankle and hinging forward  at hips.   PATIENT EDUCATION: Education details: POC, goals Person educated: Patient Education method: Explanation, Demonstration, Tactile cues, Verbal cues, and Handouts Education comprehension: verbalized understanding, returned demonstration, and verbal cues required  HOME EXERCISE PROGRAM: Seated hamstring stretch  [id number]  GOALS: Goals reviewed with patient? Yes  SHORT TERM GOALS: Target date: 2 weeks, 04/23/23  I HEP Baseline: Goal status: 05/08/23 Provided, ongoing  LONG TERM GOALS: Target date: 07/02/23  Improve Berg balance from 34/56 to 45/56 or greater to reduce fall risk Baseline:  Goal status: INITIAL  2.  modified oswestry improve from 31/ 50 to 18/50 Baseline:  Goal status: INITIAL  3.  Hamstrings flexibility improve to -20 B for improved tolerance of standing, walking, Baseline: -35R, -40 L Goal status: 05/15/23-met  4.  Gluteal strength, improve so that pt may perform 10 bridges , to improve bed mobility and posture in standing Baseline: unable to bridge due to hamstring pain, glut pain, weakness Goal status: INITIAL   ASSESSMENT:  CLINICAL IMPRESSION: Patient reports less pain than on her last visit. Performed multiple low back stretching activities and then some trunk stability exercises. No report sof pain, able to complete all activities.  OBJECTIVE IMPAIRMENTS: decreased activity tolerance, decreased balance, decreased endurance, decreased mobility, difficulty walking, decreased ROM, decreased strength, increased fascial restrictions, postural dysfunction, and pain.   ACTIVITY LIMITATIONS: carrying, lifting, standing, squatting, sleeping, stairs, transfers, bed mobility, and locomotion level  PARTICIPATION LIMITATIONS: meal prep, cleaning, laundry, shopping, and community activity  PERSONAL FACTORS: Age, Behavior pattern, Fitness, Past/current experiences, Time since onset of injury/illness/exacerbation, and 3+  comorbidities: parkinsons, L knee  OA, chronic LBP  are also affecting patient's functional outcome.   REHAB POTENTIAL: Fair    CLINICAL DECISION MAKING: Evolving/moderate complexity  EVALUATION COMPLEXITY: Moderate  PLAN:  PT FREQUENCY: 2x/week  PT DURATION: 12 weeks  PLANNED INTERVENTIONS: Therapeutic exercises, Therapeutic activity, Neuromuscular re-education, Balance training, Gait training, Patient/Family education, Self Care, and Joint mobilization  PLAN FOR NEXT SESSION: gentle manual techniques and modalities as needed for pain B hamstrings and gluteals, also stretching B hamstrings and hip flexors, initiate strengthening, cardiovascular equipment 10/7-STM to R gluts   Iona Beard, PT, DPT 05/15/2023, 3:24 PM

## 2023-05-21 NOTE — Therapy (Signed)
OUTPATIENT PHYSICAL THERAPY NEURO TREATMENT   Patient Name: Linda Lee MRN: [id number] DOB:11-Dec-1935, 87 y.o., female Today's Date: 05/22/2023  PCP: Rodrigo Ran REFERRING PROVIDER: same  END OF SESSION:  PT End of Session - 05/22/23 1547     Visit Number 6    Date for PT Re-Evaluation 07/02/23    PT Start Time 1545    PT Stop Time 1630    PT Time Calculation (min) 45 min    Activity Tolerance Patient tolerated treatment well;Patient limited by pain    Behavior During Therapy [facility] for tasks assessed/performed             Past Medical History:  Diagnosis Date   Anginal pain (HCC)    Angio-edema    Arthritis    Atypical chest pain 07/06/2009   Qualifier: Diagnosis of  By: Clifton James, MD, Christopher     GERD (gastroesophageal reflux disease)    Hyperlipemia    Migraines    Toxic effect of venom of bees, unintentional 06/03/2017   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     denies surgery for this   TONSILLECTOMY     TOTAL KNEE ARTHROPLASTY Right 09/25/2015   Procedure: TOTAL KNEE ARTHROPLASTY;  Surgeon: Ollen Gross, MD;  Location: [facility];  Service: Orthopedics;  Laterality: Right;   Patient Active Problem List   Diagnosis Date Noted   Fatigue 04/29/2022   Osteoporosis 02/21/2022   Lung infiltrate 02/11/2022   Acute pulmonary embolism (HCC) 01/19/2022   Elevated troponin level not due myocardial infarction 01/19/2022   Asymptomatic bacteriuria 01/19/2022   Nonsustained ventricular tachycardia (HCC) 10/06/2018   Migraine 10/06/2018   History of TIA (transient ischemic attack) 10/05/2018   Coronary artery calcification seen on  CT scan 10/05/2018   Parkinson disease (HCC) 12/10/2017   Carpal tunnel syndrome of left wrist 12/08/2017   Ulnar neuropathy 12/08/2017   Dyspnea on exertion 06/17/2017   OA (osteoarthritis) of knee 09/25/2015   Lower urinary tract infectious disease 09/04/2012   Diastolic CHF, chronic (HCC) 09/04/2012   Subjective visual disturbance, right eye 09/03/2012   Vision disturbance 09/03/2012   Ascending aortic aneurysm (HCC) 07/06/2009   Mixed hyperlipidemia 06/30/2009   GERD without esophagitis 06/30/2009   LEG CRAMPS 06/30/2009    ONSET DATE: chronic  REFERRING DIAG: Parkinsons, chronic LBP, L knee OA  THERAPY DIAG:  Difficulty in walking, not elsewhere classified  Hamstring strain, unspecified laterality, initial encounter  Balance problem  Chronic pain of left knee  Rationale for Evaluation and Treatment: Rehabilitation  SUBJECTIVE:  SUBJECTIVE STATEMENT: I am doing okay, my R knee hurts.   Pt accompanied by: self  PERTINENT HISTORY: progressive loss of function, patient reports pain in post thighs and gluteals, L knee is the most limiting factor.  She reports difficulty making it through the grocery store, difficulty walking into the facility where her husband is housed, due to pain B legs, has to sit down and rest frequently  PAIN:  Are you having pain? Yes: NPRS scale: 10/10 Pain location: L knee B gluts, post thighs Pain description: constant  Aggravating factors: walking, also hurts to turn over Relieving factors: sitting ,   PRECAUTIONS: Fall  RED FLAGS: None   WEIGHT BEARING RESTRICTIONS: No  FALLS: Has patient fallen in last 6 months? Yes. Number of falls 3 or more  LIVING ENVIRONMENT: Lives with: lives alone Lives in: House/apartment Stairs: No Has following  equipment at home: Single point cane  PLOF: Independent with basic ADLs, Independent with household mobility with device, Independent with gait, Independent with transfers, and Needs assistance with homemaking  PATIENT GOALS: be able to walk, get groceries more easily  OBJECTIVE:   DIAGNOSTIC FINDINGS: na  COGNITION: Overall cognitive status: Within functional limits for tasks assessed   SENSATION: WFL  COORDINATION: Symmetrical foot tapping Did not pill rolling, tremor B hands, greater on R  MUSCLE LENGTH: Hamstrings: Right -40 deg; Left -45 deg Prone knee flexion: Right 90 deg; Left 90 deg both with p! Ant thighs  DTRs: NT   POSTURE: flexed through thoracic spine and trunk, flexed B hips, weight bearing primarily on B forefeet  LOWER EXTREMITY ROM:   ALL ROM LE's wfl but myofascial and muscular tightness B LE's  LOWER EXTREMITY MMT:    MMT Right Eval Left Eval  Hip flexion 4 3+  Hip extension bridging Unable  P! Unable, p!  Hip abduction 4 3+  Hip adduction    Hip internal rotation    Hip external rotation    Knee flexion 4- 4-  Knee extension 4 4  Ankle dorsiflexion    Ankle plantarflexion    Ankle inversion    Ankle eversion    (Blank rows = not tested)  BED MOBILITY:  Slowed, with all bed mobility, needed assistance with supine to sit   GAIT: Gait pattern: decreased arm swing- Right, decreased arm swing- Left, decreased step length- Right, decreased step length- Left, decreased stride length, knee flexed in stance- Right, knee flexed in stance- Left, and shuffling Distance walked: 65' within clinic Assistive device utilized: Single point cane Level of assistance: Modified independence Comments: slowed speed noted  FUNCTIONAL TESTS:  Berg Balance Scale: 34/56  PATIENT SURVEYS:  Modified Oswestry 31/50   TODAY'S TREATMENT:                                                                                                                               DATE:  05/22/23 NuStep L5 x31mins  Supine LE stretches HS,  glutes, figure 4 piriformis  Feet on pball rotations, knees to chest, small bridges x10  Hip abd green 2x10  Ball squeezes 2x10 Leg ext 5# 2x10  HS curls 10# 2x10    05/15/23 NuStep L5 x 6 minutes Seated rotational weight shifts to each side x 4 Seated HS stretch 3 x 15 sec each leg Seated press ups on side of mat for postural control, 10 x 5 sec Supine stretching with legs over physioball, DKTC, LTR, figure 4 stretch Supine strengthening over physioball- Quick small side to side ball roll to engage abdominals, bridging, alternating leg lift off ball while stabilizing with opposite leg.  05/12/23 NuStep L3 x 6 minutes, then educated in intervals using arms to push, then pull, then legs to pedal with rest breaks Supine LE stretching with strap, HS, abd, add x 30 sec with each leg-Unable to complete with RLE due to strain in back SKTC x 30 sec each side. DKTC x 30 sec Passive stretch to low back, lateral hip, ITB, piriformis, deep STM to R gluts Seated rotational weight shifts with B hands to the R, therapist facilitating activation and then stretch, repeat x 3 to each side. MH to low back and gluts x 10 min for pain.  05/08/23 NuStep L5 x 6 minutes Supine stretch with physioball-figure 4, gluts, LTR, x 5 each side Supine strength with physioball- bridge, 10, quick roll side to side 20 Supine SLR, SLR with hip ER Seated HS and calf stretch Standing tandem weight shifts at counter, R and then L leg forward   04/16/23 NuStep L5 x25mins  Calf stretch on slant 30s  Seated HS stretch and piriformis 30s  Supine HS stretch with strap 30s  Glute stretching 30s Small bridges x10 Feet on ball trunk rotations and knees to chest x10 STS 2x10  Box taps 4" with SPC Step ups 4" at stairs- CGA/minA x10 RLE, x5 LLE   04/09/23: Eval, Prone for cross friction massage B hamstrings and gluteals with massage gun, poor tolerance L  hamstrings.   Instructed in seated hamstring stretch, using cane to dorsiflex ankle and hinging forward at hips.   PATIENT EDUCATION: Education details: POC, goals Person educated: Patient Education method: Explanation, Demonstration, Tactile cues, Verbal cues, and Handouts Education comprehension: verbalized understanding, returned demonstration, and verbal cues required  HOME EXERCISE PROGRAM: Seated hamstring stretch  [id number]  GOALS: Goals reviewed with patient? Yes  SHORT TERM GOALS: Target date: 2 weeks, 04/23/23  I HEP Baseline: Goal status: 05/08/23 Provided, ongoing  LONG TERM GOALS: Target date: 07/02/23  Improve Berg balance from 34/56 to 45/56 or greater to reduce fall risk Baseline:  Goal status: INITIAL  2.  modified oswestry improve from 31/ 50 to 18/50 Baseline:  Goal status: INITIAL  3.  Hamstrings flexibility improve to -20 B for improved tolerance of standing, walking, Baseline: -35R, -40 L Goal status: 05/15/23-met  4.  Gluteal strength, improve so that pt may perform 10 bridges , to improve bed mobility and posture in standing Baseline: unable to bridge due to hamstring pain, glut pain, weakness Goal status: INITIAL   ASSESSMENT:  CLINICAL IMPRESSION: Patient reports she is doing okay, her husband passed away last June 23, 2023 so she is trying to figure out her new day to day schedule. The hamstrings are still bothersome as is the L knee. Started with some stretching and continued with some light LE strengthening. Reports leg extensions are hard and bothers her back a little bit.   OBJECTIVE IMPAIRMENTS: decreased  activity tolerance, decreased balance, decreased endurance, decreased mobility, difficulty walking, decreased ROM, decreased strength, increased fascial restrictions, postural dysfunction, and pain.   ACTIVITY LIMITATIONS: carrying, lifting, standing, squatting, sleeping, stairs, transfers, bed mobility, and locomotion level  PARTICIPATION  LIMITATIONS: meal prep, cleaning, laundry, shopping, and community activity  PERSONAL FACTORS: Age, Behavior pattern, Fitness, Past/current experiences, Time since onset of injury/illness/exacerbation, and 3+ comorbidities: parkinsons, L knee OA, chronic LBP  are also affecting patient's functional outcome.   REHAB POTENTIAL: Fair    CLINICAL DECISION MAKING: Evolving/moderate complexity  EVALUATION COMPLEXITY: Moderate  PLAN:  PT FREQUENCY: 2x/week  PT DURATION: 12 weeks  PLANNED INTERVENTIONS: Therapeutic exercises, Therapeutic activity, Neuromuscular re-education, Balance training, Gait training, Patient/Family education, Self Care, and Joint mobilization  PLAN FOR NEXT SESSION: gentle manual techniques and modalities as needed for pain B hamstrings and gluteals, also stretching B hamstrings and hip flexors, initiate strengthening, cardiovascular equipment    Cassie Freer, PT, DPT 05/22/2023, 4:27 PM

## 2023-05-22 ENCOUNTER — Ambulatory Visit: Payer: Medicare PPO

## 2023-05-22 DIAGNOSIS — M25562 Pain in left knee: Secondary | ICD-10-CM | POA: Diagnosis not present

## 2023-05-22 DIAGNOSIS — S76319A Strain of muscle, fascia and tendon of the posterior muscle group at thigh level, unspecified thigh, initial encounter: Secondary | ICD-10-CM | POA: Diagnosis not present

## 2023-05-22 DIAGNOSIS — G8929 Other chronic pain: Secondary | ICD-10-CM

## 2023-05-22 DIAGNOSIS — M6281 Muscle weakness (generalized): Secondary | ICD-10-CM | POA: Diagnosis not present

## 2023-05-22 DIAGNOSIS — R2689 Other abnormalities of gait and mobility: Secondary | ICD-10-CM

## 2023-05-22 DIAGNOSIS — R262 Difficulty in walking, not elsewhere classified: Secondary | ICD-10-CM | POA: Diagnosis not present

## 2023-05-22 DIAGNOSIS — R2681 Unsteadiness on feet: Secondary | ICD-10-CM | POA: Diagnosis not present

## 2023-05-22 DIAGNOSIS — R278 Other lack of coordination: Secondary | ICD-10-CM | POA: Diagnosis not present

## 2023-05-22 DIAGNOSIS — R293 Abnormal posture: Secondary | ICD-10-CM | POA: Diagnosis not present

## 2023-05-26 NOTE — Therapy (Incomplete)
OUTPATIENT PHYSICAL THERAPY NEURO TREATMENT   Patient Name: Linda Lee MRN: [id number] DOB:1935/09/27, 87 y.o., female Today's Date: 05/26/2023  PCP: Rodrigo Ran REFERRING PROVIDER: same  END OF SESSION:    Past Medical History:  Diagnosis Date   Anginal pain (HCC)    Angio-edema    Arthritis    Atypical chest pain 07/06/2009   Qualifier: Diagnosis of  By: Clifton James, MD, Christopher     GERD (gastroesophageal reflux disease)    Hyperlipemia    Migraines    Toxic effect of venom of bees, unintentional 06/03/2017   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     denies surgery for this   TONSILLECTOMY     TOTAL KNEE ARTHROPLASTY Right 09/25/2015   Procedure: TOTAL KNEE ARTHROPLASTY;  Surgeon: Ollen Gross, MD;  Location: [facility];  Service: Orthopedics;  Laterality: Right;   Patient Active Problem List   Diagnosis Date Noted   Fatigue 04/29/2022   Osteoporosis 02/21/2022   Lung infiltrate 02/11/2022   Acute pulmonary embolism (HCC) 01/19/2022   Elevated troponin level not due myocardial infarction 01/19/2022   Asymptomatic bacteriuria 01/19/2022   Nonsustained ventricular tachycardia (HCC) 10/06/2018   Migraine 10/06/2018   History of TIA (transient ischemic attack) 10/05/2018   Coronary artery calcification seen on CT scan 10/05/2018   Parkinson disease (HCC) 12/10/2017   Carpal tunnel syndrome of left wrist 12/08/2017   Ulnar neuropathy 12/08/2017   Dyspnea on exertion 06/17/2017   OA (osteoarthritis) of knee 09/25/2015   Lower urinary tract infectious disease 09/04/2012   Diastolic CHF, chronic (HCC) 09/04/2012   Subjective visual  disturbance, right eye 09/03/2012   Vision disturbance 09/03/2012   Ascending aortic aneurysm (HCC) 07/06/2009   Mixed hyperlipidemia 06/30/2009   GERD without esophagitis 06/30/2009   LEG CRAMPS 06/30/2009    ONSET DATE: chronic  REFERRING DIAG: Parkinsons, chronic LBP, L knee OA  THERAPY DIAG:  No diagnosis found.  Rationale for Evaluation and Treatment: Rehabilitation  SUBJECTIVE:                                                                                                                                                                                             SUBJECTIVE STATEMENT: I am doing okay, my R knee hurts.   Pt  accompanied by: self  PERTINENT HISTORY: progressive loss of function, patient reports pain in post thighs and gluteals, L knee is the most limiting factor.  She reports difficulty making it through the grocery store, difficulty walking into the facility where her husband is housed, due to pain B legs, has to sit down and rest frequently  PAIN:  Are you having pain? Yes: NPRS scale: 10/10 Pain location: L knee B gluts, post thighs Pain description: constant  Aggravating factors: walking, also hurts to turn over Relieving factors: sitting ,   PRECAUTIONS: Fall  RED FLAGS: None   WEIGHT BEARING RESTRICTIONS: No  FALLS: Has patient fallen in last 6 months? Yes. Number of falls 3 or more  LIVING ENVIRONMENT: Lives with: lives alone Lives in: House/apartment Stairs: No Has following equipment at home: Single point cane  PLOF: Independent with basic ADLs, Independent with household mobility with device, Independent with gait, Independent with transfers, and Needs assistance with homemaking  PATIENT GOALS: be able to walk, get groceries more easily  OBJECTIVE:   DIAGNOSTIC FINDINGS: na  COGNITION: Overall cognitive status: Within functional limits for tasks assessed   SENSATION: WFL  COORDINATION: Symmetrical foot tapping Did not pill  rolling, tremor B hands, greater on R  MUSCLE LENGTH: Hamstrings: Right -40 deg; Left -45 deg Prone knee flexion: Right 90 deg; Left 90 deg both with p! Ant thighs  DTRs: NT   POSTURE: flexed through thoracic spine and trunk, flexed B hips, weight bearing primarily on B forefeet  LOWER EXTREMITY ROM:   ALL ROM LE's wfl but myofascial and muscular tightness B LE's  LOWER EXTREMITY MMT:    MMT Right Eval Left Eval  Hip flexion 4 3+  Hip extension bridging Unable  P! Unable, p!  Hip abduction 4 3+  Hip adduction    Hip internal rotation    Hip external rotation    Knee flexion 4- 4-  Knee extension 4 4  Ankle dorsiflexion    Ankle plantarflexion    Ankle inversion    Ankle eversion    (Blank rows = not tested)  BED MOBILITY:  Slowed, with all bed mobility, needed assistance with supine to sit   GAIT: Gait pattern: decreased arm swing- Right, decreased arm swing- Left, decreased step length- Right, decreased step length- Left, decreased stride length, knee flexed in stance- Right, knee flexed in stance- Left, and shuffling Distance walked: 66' within clinic Assistive device utilized: Single point cane Level of assistance: Modified independence Comments: slowed speed noted  FUNCTIONAL TESTS:  Berg Balance Scale: 34/56  PATIENT SURVEYS:  Modified Oswestry 31/50   TODAY'S TREATMENT:                                                                                                                              DATE:  05/27/23 NuStep Calf stretch Calf raises Box taps  HS curls Leg ext  Lateral side steps green   05/22/23 NuStep L5  x59mins  Supine LE stretches HS, glutes, figure 4 piriformis  Feet on pball rotations, knees to chest, small bridges x10  Hip abd green 2x10  Ball squeezes 2x10 Leg ext 5# 2x10  HS curls 10# 2x10    05/15/23 NuStep L5 x 6 minutes Seated rotational weight shifts to each side x 4 Seated HS stretch 3 x 15 sec each leg Seated press  ups on side of mat for postural control, 10 x 5 sec Supine stretching with legs over physioball, DKTC, LTR, figure 4 stretch Supine strengthening over physioball- Quick small side to side ball roll to engage abdominals, bridging, alternating leg lift off ball while stabilizing with opposite leg.  05/12/23 NuStep L3 x 6 minutes, then educated in intervals using arms to push, then pull, then legs to pedal with rest breaks Supine LE stretching with strap, HS, abd, add x 30 sec with each leg-Unable to complete with RLE due to strain in back SKTC x 30 sec each side. DKTC x 30 sec Passive stretch to low back, lateral hip, ITB, piriformis, deep STM to R gluts Seated rotational weight shifts with B hands to the R, therapist facilitating activation and then stretch, repeat x 3 to each side. MH to low back and gluts x 10 min for pain.  05/08/23 NuStep L5 x 6 minutes Supine stretch with physioball-figure 4, gluts, LTR, x 5 each side Supine strength with physioball- bridge, 10, quick roll side to side 20 Supine SLR, SLR with hip ER Seated HS and calf stretch Standing tandem weight shifts at counter, R and then L leg forward   04/16/23 NuStep L5 x55mins  Calf stretch on slant 30s  Seated HS stretch and piriformis 30s  Supine HS stretch with strap 30s  Glute stretching 30s Small bridges x10 Feet on ball trunk rotations and knees to chest x10 STS 2x10  Box taps 4" with SPC Step ups 4" at stairs- CGA/minA x10 RLE, x5 LLE   04/09/23: Eval, Prone for cross friction massage B hamstrings and gluteals with massage gun, poor tolerance L hamstrings.   Instructed in seated hamstring stretch, using cane to dorsiflex ankle and hinging forward at hips.   PATIENT EDUCATION: Education details: POC, goals Person educated: Patient Education method: Explanation, Demonstration, Tactile cues, Verbal cues, and Handouts Education comprehension: verbalized understanding, returned demonstration, and verbal cues  required  HOME EXERCISE PROGRAM: Seated hamstring stretch  [id number]  GOALS: Goals reviewed with patient? Yes  SHORT TERM GOALS: Target date: 2 weeks, 04/23/23  I HEP Baseline: Goal status: 05/08/23 Provided, ongoing  LONG TERM GOALS: Target date: 07/02/23  Improve Berg balance from 34/56 to 45/56 or greater to reduce fall risk Baseline:  Goal status: INITIAL  2.  modified oswestry improve from 31/ 50 to 18/50 Baseline:  Goal status: INITIAL  3.  Hamstrings flexibility improve to -20 B for improved tolerance of standing, walking, Baseline: -35R, -40 L Goal status: 05/15/23-met  4.  Gluteal strength, improve so that pt may perform 10 bridges , to improve bed mobility and posture in standing Baseline: unable to bridge due to hamstring pain, glut pain, weakness Goal status: INITIAL   ASSESSMENT:  CLINICAL IMPRESSION: Patient reports she is doing okay, her husband passed away last 24-Jun-2023 so she is trying to figure out her new day to day schedule. The hamstrings are still bothersome as is the L knee. Started with some stretching and continued with some light LE strengthening. Reports leg extensions are hard and bothers her back a  little bit.   OBJECTIVE IMPAIRMENTS: decreased activity tolerance, decreased balance, decreased endurance, decreased mobility, difficulty walking, decreased ROM, decreased strength, increased fascial restrictions, postural dysfunction, and pain.   ACTIVITY LIMITATIONS: carrying, lifting, standing, squatting, sleeping, stairs, transfers, bed mobility, and locomotion level  PARTICIPATION LIMITATIONS: meal prep, cleaning, laundry, shopping, and community activity  PERSONAL FACTORS: Age, Behavior pattern, Fitness, Past/current experiences, Time since onset of injury/illness/exacerbation, and 3+ comorbidities: parkinsons, L knee OA, chronic LBP  are also affecting patient's functional outcome.   REHAB POTENTIAL: Fair    CLINICAL DECISION MAKING:  Evolving/moderate complexity  EVALUATION COMPLEXITY: Moderate  PLAN:  PT FREQUENCY: 2x/week  PT DURATION: 12 weeks  PLANNED INTERVENTIONS: Therapeutic exercises, Therapeutic activity, Neuromuscular re-education, Balance training, Gait training, Patient/Family education, Self Care, and Joint mobilization  PLAN FOR NEXT SESSION: gentle manual techniques and modalities as needed for pain B hamstrings and gluteals, also stretching B hamstrings and hip flexors, initiate strengthening, cardiovascular equipment    Cassie Freer, PT, DPT 05/26/2023, 10:37 AM

## 2023-05-27 ENCOUNTER — Ambulatory Visit: Payer: Medicare PPO

## 2023-06-02 ENCOUNTER — Ambulatory Visit: Payer: Medicare PPO

## 2023-06-05 ENCOUNTER — Ambulatory Visit: Payer: Medicare PPO

## 2023-08-07 ENCOUNTER — Other Ambulatory Visit: Payer: Self-pay | Admitting: Neurology

## 2023-08-07 DIAGNOSIS — G20A1 Parkinson's disease without dyskinesia, without mention of fluctuations: Secondary | ICD-10-CM

## 2023-08-14 DIAGNOSIS — M1712 Unilateral primary osteoarthritis, left knee: Secondary | ICD-10-CM | POA: Diagnosis not present

## 2023-08-18 NOTE — Progress Notes (Signed)
 Assessment/Plan:   1.  Parkinsons Disease  -Continue carbidopa /levodopa  25/100, 1.5 tablets 3 times per day  -continue carbidopa /levodopa  50/200 CR at bed for tremor at nighttime  -We discussed that it used to be thought that levodopa  would increase risk of melanoma but now it is believed that Parkinsons itself likely increases risk of melanoma. she is to get regular skin checks.  -contemplating a move to [place]  -is going to be restarting PT   2.  Severe spinal canal stenosis at L3-L4  -Has seen Dr. Nigel Bart previously and also seen Dr. Julane Ny now.  She has seen Dr. Crecencio Dodge and likely needs to call him to see if injection in back will help the foot.  Hasn't had one in long time  3.  History of expressive aphasia, likely representing complicated migraine   -On Lipitor.  Aspirin  was discontinued when she was placed on apixaban  for a pulmonary embolus.  Extensive negative work-up.  4.  Sialorrhea  -This is commonly associated with PD.  We talked about treatments.  The patient is not a candidate for oral anticholinergic therapy because of increased risk of confusion and falls.  We discussed Botox (type A and B) and 1% atropine drops.  We discusssed that candy like lemon drops can help by stimulating mm of the oropharynx to induce swallowing.  She is not interested right now  5.    Pulmonary embolus  -On Eliquis .   Subjective:   Linda Lee was seen today in follow up for Parkinsons disease.  My previous records were reviewed prior to todays visit as well as outside records available to me.  A few weeks after I saw the patient, she was in the emergency room after a fall.  She was walking her dog and the ground was wet and she slipped and fell and hit her head.  Because of the Eliquis , she did go and get evaluated.  Fortunately, everything looked good.  She is back in the emergency room a couple days later with headache, but workup again was negative.  She has been to physical  therapy.  Unfortunately, her husband did pass away since our last visit after a long illness/dementia.  Then her dog had to have spinal/neck surgery.  She is contemplating moving to [place] with her son.  Having pain in foot on R.  Hasn't had injection in back in long time  Current prescribed movement disorder medications: Carbidopa /levodopa  25/100, 1.5 tablets 3 times per day carbidopa /levodopa  50/200 cr q hs, added last visit for q hs tremor    ALLERGIES:   Allergies  Allergen Reactions   Ciprofloxacin  Other (See Comments)    Can not take due to hx of Cdiff per patient   Wasp Venom Other (See Comments)    dizziness   Fluconazole Rash    CURRENT MEDICATIONS:  Outpatient Encounter Medications as of 08/20/2023  Medication Sig   apixaban  (ELIQUIS ) 2.5 MG TABS tablet Take 1 tablet (2.5 mg total) by mouth 2 (two) times daily.   atorvastatin  (LIPITOR) 20 MG tablet Take 1 tablet (20 mg total) by mouth daily after supper.   carbidopa -levodopa  (SINEMET  CR) 50-200 MG tablet TAKE 1 TABLET BY MOUTH AT BEDTIME   carbidopa -levodopa  (SINEMET  IR) 25-100 MG tablet TAKE 1 AND 1/2 TABLETS BY MOUTH THREE TIMES DAILY   CRANBERRY PO Take 300 mg daily by mouth.   Cyanocobalamin (VITAMIN B-12 PO) Take 1,200 mcg daily by mouth.    docusate sodium  (COLACE) 100 MG capsule Take  100 mg by mouth daily as needed for mild constipation.   EPINEPHrine  0.3 mg/0.3 mL IJ SOAJ injection Inject 0.3 mg into the muscle once as needed for up to 1 dose (anaphylaxis).   Ergocalciferol  (VITAMIN D2) 2000 units TABS Take 2,000 Units daily by mouth.    furosemide  (LASIX ) 20 MG tablet Take 20 mg by mouth every Monday, Wednesday, and Friday.   KLOR-CON  M10 10 MEQ tablet Take 10 mEq by mouth 3 (three) times a week.   omeprazole  (PRILOSEC ) 40 MG capsule Take 40 mg by mouth continuous as needed (indigestion).   propranolol  ER (INDERAL  LA) 80 MG 24 hr capsule TAKE 1 CAPSULE(80 MG) BY MOUTH DAILY (Patient taking differently: Take 80  mg by mouth daily.)   saccharomyces boulardii (FLORASTOR) 250 MG capsule Take 250 mg by mouth 3 (three) times a week.   No facility-administered encounter medications on file as of 08/20/2023.    Objective:   PHYSICAL EXAMINATION:    VITALS:   There were no vitals filed for this visit.   GEN:  The patient appears stated age and is in NAD. HEENT:  Normocephalic, atraumatic.  The mucous membranes are moist. The superficial temporal arteries are without ropiness or tenderness. Cv:  rrr Lungs:  ctab Neck:  no bruits  Neurological examination:  Orientation: The patient is alert and oriented x3. Cranial nerves: There is good facial symmetry without significant facial hypomimia. The speech is fluent and clear. Soft palate rises symmetrically and there is no tongue deviation. Hearing is intact to conversational tone. Sensation: Sensation is intact to light touch throughout.  No extinction with double simultaneous stimulation. Motor: Strength is 5/5 in the upper and lower extremities.  There is no pronator drift.  Movement examination: Tone: There is nl tone in the UE/LE Abnormal movements: there is tremor in the RLE/RUE, mild and stable Coordination:  There is no decremation today, with any form of RAMS, including alternating supination and pronation of the forearm, hand opening and closing, finger taps, heel taps and toe taps. Gait and Station: The patient pushes off.  She is careful with ambulation and is a bit unsteady.  She has her cane  I have reviewed and interpreted the following labs independently    Chemistry      Component Value Date/Time   NA 141 03/06/2023 1141   K 4.3 03/06/2023 1141   CL 106 03/06/2023 1141   CO2 26 03/06/2023 1141   BUN 23 03/06/2023 1141   CREATININE 0.88 03/06/2023 1141      Component Value Date/Time   CALCIUM  8.3 (L) 03/06/2023 1141   ALKPHOS 100 12/20/2022 1355   AST 10 12/20/2022 1355   ALT 3 12/20/2022 1355   BILITOT 0.7 12/20/2022 1355        Lab Results  Component Value Date   WBC 3.5 (L) 03/06/2023   HGB 11.7 (L) 03/06/2023   HCT 35.9 (L) 03/06/2023   MCV 95.7 03/06/2023   PLT 126 (L) 03/06/2023   Total time spent on today's visit was 30 minutes, including both face-to-face time and nonface-to-face time.  Time included that spent on review of records (prior notes available to me/labs/imaging if pertinent), discussing treatment and goals, answering patient's questions and coordinating care.   Cc:  Aldo Hun, MD

## 2023-08-20 ENCOUNTER — Ambulatory Visit: Payer: Medicare PPO | Admitting: Neurology

## 2023-08-20 VITALS — BP 124/70 | HR 67 | Wt 165.0 lb

## 2023-08-20 DIAGNOSIS — M5416 Radiculopathy, lumbar region: Secondary | ICD-10-CM | POA: Diagnosis not present

## 2023-08-20 DIAGNOSIS — G20A1 Parkinson's disease without dyskinesia, without mention of fluctuations: Secondary | ICD-10-CM | POA: Diagnosis not present

## 2023-08-20 NOTE — Patient Instructions (Signed)
 Local and Online Resources for Power over Parkinson's Group?  January 2025 ?  LOCAL [place] PARKINSON'S GROUPS??  Power over Parkinson's Group:???  Upcoming Power over [employer] Meetings/Care Partner Support:? 2nd Wednesdays of the month at 2 pm:  January 8th, February 12th Contact Lynwood Dawley at [email or web address] or Amy Marriott at [email or web address] if interested in participating in this group?  ?  LOCAL EVENTS AND NEW OFFERINGS?  Dance Project Spring 2025:  January 14-May 20, Tuesdays 10-11 am.  All details on website: [email or web address] ACT FITNESS Chair Yoga classes "Train and Gain", Fridays 10 am, [employer].  Contact Gina at [phone].   PWR! Moves Paint class!  Wednesdays at 10 am.  Please contact Lonia Blood, PT at [email or web address] if interested. Health visitor Classes offering at [employer]!? Tuesdays (Chair Yoga)  and Wednesdays (PWR! Moves)  1:00 pm.?? Contact Aldona Lento [phone] or [email or web address] Drumming for Parkinson's will be held on 2nd and 4th Mondays at 11:00 am.?? Located at the [place] of the [place] ([address] [place]. [place].) *Next class is January 13th.? Contact Albertina Parr at [email or web address] or [phone]?  [employer] Parkinson's Tai Chi Class, Mondays at 11 am.  Call [phone] for details  [facility] at [facility]- [address], [place] Wednesdays- 4:00 - 5:00 PM - specifically for Parkinson's Disease.  Free!  Contact Denny Peon, [place] - [phone] (clinic) or  [phone] (cell) or by email: [email or web address]   ?ONLINE EDUCATION AND SUPPORT?  Parkinson Foundation:? www.parkinson.org?  PD Health at Home continues:? Mindfulness Mondays, Wellness Wednesdays, Fitness Fridays??  Upcoming Education:??  Empowerment through Movement, Wednesday,  January 15th, 1-2 pm A Deep Dive into Deep Brain Stimulation (DBS), Wednesday, January 29th, 1-2 pm Expert Briefing:    Stay tuned Register for virtual education and expert briefings (webinars) at [email or web address]  Please check out their website to sign up for emails and see their full online offerings??  ?  [employer] Foundation:? www.[email or web address]??  Third Thursday Webinars:? On the third Thursday of every month at 12 p.m. ET, join our free live webinars to learn about various aspects of living with Parkinson's disease and our work to speed medical breakthroughs.?  Upcoming Webinar:? Managing the Hidden Symptoms:  Mood and Motivation Changes in Parkinson's.  Thursday, January 16th at 12 noon.  Check out additional information on their website to see their full online offerings?  ?  [employer]:? [email or web address]?  Upcoming Webinar:   Stay tuned Series:? Living with Parkinson's Meetup.?? Third Thursdays each month, 3 pm?  Care Partner Monthly Meetup.? With Jillene Bucks [place].? First Tuesday of each month, 2 pm?  Check out additional information to Live Well Today on their website?  ?  Parkinson and Movement Disorders (PMD) Alliance:? www.pmdalliance.org?  Ne[employer] Online:? Online Education Events?  Sign up for emails, which are sent weekly to give you updates on programming and online offerings?  ?  Parkinson's Association of the [place]:? www.parkinsonassociation.org?  Information on online support groups, education events, and online exercises including Yoga, Parkinson's exercises and more-LOTS of information on links to PD resources and online events?  Virtual Support Group through [employer] of the [place]-First Wednesday of each month at 2 pm   MOVEMENT AND EXERCISE OPPORTUNITIES?  PWR! Moves [place] class has returned!  Wednesdays at 10 am.  Please contact Lonia Blood, PT at  [email or web address] if interested. Parkinson's Exercise Class offerings at [employer]. Tuesdays (Chair yoga) and Wednesdays (PWR! Moves)  1:00  pm.?  Contact Aldona Lento [phone] or [email or web address]  Parkinson's Wellness Recovery (PWR! Moves)? www.[email or web address]?  Info on the PWR! Virtual Experience:? You will have access to our expertise?through self-assessment, guided plans that start with the PD-specific fundamentals, educational content, tips, Q&A with an expert, and a growing Engineering geologist of PD-specific pre-recorded and live exercise classes of varying types and intensity - both physical and cognitive! If that is not enough, we offer 1:1 wellness consultations (in-person or virtual) to personalize your PWR! Dance movement psychotherapist.??  Parkinson [employer] Fridays:??  As part of the PD Health @ Home program, this free video series focuses each week on one aspect of fitness designed to support people living with Parkinson's.? These weekly videos highlight the Parkinson Foundation fitness guidelines for people with Parkinson's disease.?  [email or web address].[email or web address]?  Dance for PD website is offering free, live-stream classes throughout the week, as well as links to [employer] of classes:? [email or web address]/?  Virtual dance and Pilates for Parkinson's classes: Click on the Community Tab> Parkinson's Movement Initiative Tab.? To register for classes and for more information, visit [email or web address] and click the "community" tab.??  [employer] Parkinson's Cycling Classes??  [facility]:? Thursdays @ Noon-Live classes at [employer] ([employer] at [email or web address]?or [phone])?  [facility] [employer] [facility]: Classes Tuesday, Wednesday and Thursday (contact [place] at [email or web address] ?or [phone])?  [employer]?  Varied levels of classes are offered Tuesdays and  Thursdays at [facility]??  Stretching with Byrd Hesselbach weekly class is also offered for people with Parkinson's?  To observe a class or for more information, call [phone] or email Patricia Nettle at [email or web address]?    ADDITIONAL SUPPORT AND RESOURCES?  [employer]:  Chiropractor:? www.well-springsolutions.org/caregiver-education/caregiver-support-group.? You may also contact Loleta Chance at [facility] -spring.org or [phone].????  Well[employer]Spring Navigator:? Just1Navigator program, a?free service to help individuals and families through the journey of determining care for older adults.? The "Navigator" is a Child psychotherapist, Sidney Ace, who will speak with a prospective client and/or loved ones to provide an assessment of the situation and a set of recommendations for a personalized care plan -- all free of charge, and whether?[employer] offers the needed service or not. If the need is not a service we provide, we are well-connected with reputable programs in town that we can refer you to.? www.well-springsolutions.org or to speak with the Navigator, call [phone].?

## 2023-08-25 DIAGNOSIS — I129 Hypertensive chronic kidney disease with stage 1 through stage 4 chronic kidney disease, or unspecified chronic kidney disease: Secondary | ICD-10-CM | POA: Diagnosis not present

## 2023-08-25 DIAGNOSIS — M8580 Other specified disorders of bone density and structure, unspecified site: Secondary | ICD-10-CM | POA: Diagnosis not present

## 2023-08-25 DIAGNOSIS — N814 Uterovaginal prolapse, unspecified: Secondary | ICD-10-CM | POA: Diagnosis not present

## 2023-08-25 DIAGNOSIS — E785 Hyperlipidemia, unspecified: Secondary | ICD-10-CM | POA: Diagnosis not present

## 2023-08-26 DIAGNOSIS — N3946 Mixed incontinence: Secondary | ICD-10-CM | POA: Diagnosis not present

## 2023-08-26 DIAGNOSIS — Z6828 Body mass index (BMI) 28.0-28.9, adult: Secondary | ICD-10-CM | POA: Diagnosis not present

## 2023-08-26 DIAGNOSIS — N8110 Cystocele, unspecified: Secondary | ICD-10-CM | POA: Diagnosis not present

## 2023-08-28 DIAGNOSIS — M1712 Unilateral primary osteoarthritis, left knee: Secondary | ICD-10-CM | POA: Diagnosis not present

## 2023-09-01 DIAGNOSIS — E785 Hyperlipidemia, unspecified: Secondary | ICD-10-CM | POA: Diagnosis not present

## 2023-09-01 DIAGNOSIS — Z8673 Personal history of transient ischemic attack (TIA), and cerebral infarction without residual deficits: Secondary | ICD-10-CM | POA: Diagnosis not present

## 2023-09-01 DIAGNOSIS — I2699 Other pulmonary embolism without acute cor pulmonale: Secondary | ICD-10-CM | POA: Diagnosis not present

## 2023-09-01 DIAGNOSIS — I10 Essential (primary) hypertension: Secondary | ICD-10-CM | POA: Diagnosis not present

## 2023-09-01 DIAGNOSIS — G629 Polyneuropathy, unspecified: Secondary | ICD-10-CM | POA: Diagnosis not present

## 2023-09-01 DIAGNOSIS — Z0000 Encounter for general adult medical examination without abnormal findings: Secondary | ICD-10-CM | POA: Diagnosis not present

## 2023-09-01 DIAGNOSIS — G20A1 Parkinson's disease without dyskinesia, without mention of fluctuations: Secondary | ICD-10-CM | POA: Diagnosis not present

## 2023-09-01 DIAGNOSIS — G43109 Migraine with aura, not intractable, without status migrainosus: Secondary | ICD-10-CM | POA: Diagnosis not present

## 2023-09-01 DIAGNOSIS — R82998 Other abnormal findings in urine: Secondary | ICD-10-CM | POA: Diagnosis not present

## 2023-09-04 DIAGNOSIS — M1712 Unilateral primary osteoarthritis, left knee: Secondary | ICD-10-CM | POA: Diagnosis not present

## 2023-09-08 DIAGNOSIS — N8110 Cystocele, unspecified: Secondary | ICD-10-CM | POA: Diagnosis not present

## 2023-09-08 DIAGNOSIS — Z4689 Encounter for fitting and adjustment of other specified devices: Secondary | ICD-10-CM | POA: Diagnosis not present

## 2023-09-08 DIAGNOSIS — N3941 Urge incontinence: Secondary | ICD-10-CM | POA: Diagnosis not present

## 2023-09-10 DIAGNOSIS — M1712 Unilateral primary osteoarthritis, left knee: Secondary | ICD-10-CM | POA: Diagnosis not present

## 2023-10-17 ENCOUNTER — Other Ambulatory Visit (HOSPITAL_COMMUNITY): Payer: Self-pay

## 2023-10-20 ENCOUNTER — Ambulatory Visit (HOSPITAL_COMMUNITY)
Admission: RE | Admit: 2023-10-20 | Discharge: 2023-10-20 | Disposition: A | Discharge status: Home / Self Care | Source: Ambulatory Visit | Attending: Internal Medicine | Admitting: Internal Medicine

## 2023-10-20 DIAGNOSIS — M810 Age-related osteoporosis without current pathological fracture: Secondary | ICD-10-CM | POA: Insufficient documentation

## 2023-10-20 MED ORDER — DENOSUMAB 60 MG/ML ~~LOC~~ SOSY
60.0000 mg | PREFILLED_SYRINGE | Freq: Once | SUBCUTANEOUS | Status: AC
  Administered 2023-10-20: 60 mg via SUBCUTANEOUS

## 2023-10-20 MED ORDER — DENOSUMAB 60 MG/ML ~~LOC~~ SOSY
PREFILLED_SYRINGE | SUBCUTANEOUS | Status: AC
  Filled 2023-10-20: qty 1

## 2023-10-21 ENCOUNTER — Ambulatory Visit: Payer: Medicare PPO | Admitting: Internal Medicine

## 2023-10-21 ENCOUNTER — Ambulatory Visit

## 2023-10-21 ENCOUNTER — Encounter: Payer: Self-pay | Admitting: Internal Medicine

## 2023-10-21 VITALS — BP 122/70 | HR 63 | Ht 64.0 in | Wt 163.4 lb

## 2023-10-21 DIAGNOSIS — Z86711 Personal history of pulmonary embolism: Secondary | ICD-10-CM

## 2023-10-21 DIAGNOSIS — R0609 Other forms of dyspnea: Secondary | ICD-10-CM | POA: Diagnosis not present

## 2023-10-21 DIAGNOSIS — R42 Dizziness and giddiness: Secondary | ICD-10-CM

## 2023-10-21 NOTE — Patient Instructions (Addendum)
 ICD-10-CM   1. History of pulmonary embolism  Z86.711     2. Orthostatic dizziness  R42     3. DOE (dyspnea on exertion)  R06.09       #Pulmonary embolism  - You have completed 1 year of high dose/full dose Eliquis through May 2024 - You have completed the second year on low-dose Eliquis as of March 2024 -But  you seem hobbled by shortness of breath on exertion and also orthostatic dizziness  Plan  - Check for evidence of pulmonary hypertension and PE  -Do echocardiogram  -VQ scan -Do D-dimer test and blood BNP  - IF all are normal we can have a conversation about stopping Eliquis in the summer 2025  #Orthostatic dizziness  -Your blood pressure drops significantly at least 40 points when you stand up and your heart rate goes up at least 10 points -I think this is playing a role in your dizziness  Plan  - Talk to PCP and reduce or stop your propranolol  Follouwop -2-3 months or sooner with Dr. Marchelle Gearing

## 2023-10-21 NOTE — Progress Notes (Signed)
 Referring provider: Rodrigo Ran, MD  HPI: 88 year old female, former smoker who is a retired Runner, broadcasting/film/video followed for pulmonary embolism. Last seen 02/11/2022.  She was admitted from 01/19/2022 to 01/22/2022 for unprovoked acute submassive pulmonary embolism and associated acute hypoxic respiratory failure.  Past medical history significant for Parkinson's disease, lumbar spinal stenosis, osteoarthritis, AAA without rupture, diastolic CHF, migraines, GERD, HLD.  TEST/EVENTS:  01/19/2022 CTA chest: multiple bilateral pulmonary arterial filling defcts. RV to LV ratio is 1:12. Interval increase in diameter of PA. 4.3 ascending thoracic aortic aneurysm, stable up to 10 years. No LAD. Minimal b/l dependent atelectasis. Interval wedge-shaped area of consolidation in the anteromedial aspect of the RLL with air bronchogram. Interval minimal irregular patchy density more superiorly and laterally in the RLL.  01/20/2022 echocardiogram: EF 60-65%; G1DD. RV size and function nl. Severely elevated PASP. Trivial MR. Moderate TR. AR mild. Mild dilatation of the ascending aorta.  04/01/2022 echocardiogram: EF 60-65%. LVH. RV size and function nl. Unable to measure PASP. Trivial MR. AR mild to moderate.   01/19/2022-01/22/2022: Hospitalization for acute respiratory failure related to unprovoked acute submassive PE with right heart strain on CTA. She had echocardiogram with normal right sided function and severely elevated PASP. She was treated with heparin drip and transitioned to PO Eliquis. Recommended lifelong as clot was unprovoked. She had RLL infiltrates on her imaging, suspected to be related to PE. She did receiver 1 dose of ceftriaxone and azithromycin; recommended outpatient imaging to ensure resolution.   02/11/2022: [name] with [facility] NP for hospital follow up. She reports feeling better since being home. She still has some fatigue and occasional chest discomfort. She also gets winded more easily with activities than  she prior to this. Her cough is hacking but has cleared up and she is no longer producing sputum. All of her symptoms are steadily improving though and she feels like she is getting some energy back. She would like to go back to her exercise class for people with Parkinson's and wants to know if this would be okay. She denies any bleeding/excessive bruising, hemoptysis, fevers, chills. She is taking her Eliquis twice daily. Plan to repeat echo in 3 months.  04/29/2022: Today - follow up Patient presents today for follow up.  She has been doing relatively well since I saw her last.  Feels like her breathing is stable.  She still struggling with daily fatigue symptoms.  Feels like she just does not have much energy.  She also admits that she has not been doing much as far as exercise/activity.  Prior to her hospitalization, she was going to a exercise class directed for people with Parkinson's.  She has not returned to this since.  She was working with physical therapy though and felt like this was helping with her strength and back pain.  She denies any bleeding/excessive bruising, hemoptysis, epistaxis, shortness of breath, calf pain/swelling.  She is taking her Eliquis twice daily.  OV 12/20/2022  Subjective:  Patient ID: Linda Lee, female , DOB: [birth year] , age 88 y.o. , MRN: [id number] , ADDRESS: [address] [place] [address] PCP Rodrigo Ran, MD Patient Care Team: Rodrigo Ran, MD as PCP - General (Internal Medicine) Tat, Octaviano Batty, DO as Consulting Physician (Neurology) Fletcher Anon, MD (Inactive) as Consulting Physician (Allergy and Immunology)  This Provider for this visit: Treatment Team:  Attending Provider: Kalman Shan, MD    12/20/2022 -   Chief Complaint  Patient presents with   Follow-up  Breathing is overall doing well. She tires easily and states that this is unchanged since her last visit.      HPI Linda Lee 88 y.o. -     I  saw her in the hospital.  And since then she has been seen by nurse practitioners.  She comes today for for follow-up.  She continues to do well.  She says she does not have any shortness of breath.  No coughing no wheezing no orthopnea no paroxysmal nocturnal dyspnea no chest pains.  She states that she continues to have fatigue after the PE.  This is her biggest complaint.  She says the fatigue started 3 days before the PE and is still continues although she did admit she was a lot better.  She continues to have chronic pedal edema with varicose veins and all her other medical problems.  She continues to be on Eliquis she has not had any bleeding episodes she is on Eliquis full dose.  For the fatigue today we checked her vitamin D level was normal.  She is on vitamin D supplements.  She is also on Enbrel.  She also wanted know how long to continue the Eliquis.  I did talk to her that best to continue full dose Eliquis for 1-2 years but if D-dimer and echo are normal may continue low-dose Eliquis for another couple of years particularly given her age and risk for bleeding.  She is fine with this approach.     01/22/2023: Today - follow up Patient presents today for follow-up.  She had echocardiogram with some mild dilatation of aortic root, which was unchanged compared to August 2023.  She did not have any significant RV strain.  She did have a prominent RV moderator band visualized.  EF normal.  Tells me today that she is feeling relatively stable compared to her last visit.  Does not have any trouble with her breathing.  She would like to go ahead and move forward with decreasing her Eliquis if possible.  No recent episodes of bleeding or excessive bruising.  Still having some fatigue symptoms but workup has been unremarkable.  She denies any issues with her sleep at night, snoring, witnessed apneas, morning headaches, drowsy driving.   OV 1/61/0960  Subjective:  Patient ID: Linda Lee, female  , DOB: May 28, 1936 , age 88 y.o. , MRN: [id number] , ADDRESS: [address] [place] [address] PCP Rodrigo Ran, MD Patient Care Team: Rodrigo Ran, MD as PCP - General (Internal Medicine) Tat, Octaviano Batty, DO as Consulting Physician (Neurology) Fletcher Anon, MD (Inactive) as Consulting Physician (Allergy and Immunology)  This Provider for this visit: Treatment Team:  Attending Provider: Kalman Shan, MD    10/21/2023 -   Chief Complaint  Patient presents with   Follow-up    Breathing is overall doing well. She still has issues with standing for long periods of time "feel like passing out". She has occ cough at night, non prod.    Follow-up submassive PE from June 2023-> May 2020 for D-dimer was normal and we switched to low-dose Eliquis.  HPI Linda Lee [id number] 88 y.o. -returns for follow-up.  I personally not seen her since May 2024.  She then saw a nurse practitioner June 2024.  She tells me that ever since the admission for pulm embolism in June 2023 she is having orthostatic dizziness and shortness of breath on exertion.  She says she is no longer living upstairs.  She has a  two-story home.  She is not able to go up stairs because when she climbs 1 flight of stairs she is very short of breath.  In addition when she stands up she has orthostatic dizziness as though she is going to pass out but not immediately.  It happens after 2 or 3 minutes.  This is resulted in a fall in July 2024.  But review of the records indicate that she slipped on wet ground in July 2024 while walking the dog and fell.  She followed up in August 2024 with the ER because some some numbness and also reassured at that time.  She denied any shortness of breath to the ER in August 2024 but to me she states that she is short of breath when she exerts flight of stairs.  All this is a change since the PE in summer 2023.  She has slight elevation in BNP May 2024   SUPINE BP/HR  -> 156/82 with HR  60  STNDING 2 min - > 105/71 and HR 72   Simple office walk 224 (66+46 x 2) feet Pod A at [facility] x  3 laps goal with forehead probe 12/20/2022    O2 used ra   Number laps completed Sit stand x 10   Comments about pace steady   Resting Pulse Ox/HR 100% and 59/min   Final Pulse Ox/HR 98% and 69/min   Desaturated </= 88% no   Desaturated <= 3% points no   Got Tachycardic >/= 90/min no   Symptoms at end of test none   Miscellaneous comments normal       PFT      No data to display             LAB RESULTS last 96 hours No results found.       has a past medical history of Anginal pain (HCC), Angio-edema, Arthritis, Atypical chest pain (07/06/2009), GERD (gastroesophageal reflux disease), Hyperlipemia, Migraines, and Toxic effect of venom of bees, unintentional (06/03/2017).   reports that she quit smoking about 60 years ago. Her smoking use included cigarettes. She started smoking about 65 years ago. She has a 1.3 pack-year smoking history. She has never used smokeless tobacco.  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     denies surgery for this   TONSILLECTOMY     TOTAL KNEE ARTHROPLASTY Right 09/25/2015   Procedure: TOTAL KNEE ARTHROPLASTY;  Surgeon: Ollen Gross, MD;  Location: [facility];  Service: Orthopedics;  Laterality: Right;    Allergies  Allergen Reactions   Ciprofloxacin Other (See Comments)    Can not take due to hx of Cdiff per patient   Wasp Venom Other (See Comments)    dizziness   Fluconazole Rash    Immunization History  Administered Date(s) Administered   Fluad Quad(high Dose 65+) 04/20/2019   Influenza, High Dose  Seasonal PF 05/05/2018   Influenza,inj,quad, With Preservative 05/06/2019   Influenza-Unspecified 05/06/2023   PFIZER(Purple Top)SARS-COV-2 Vaccination 09/10/2019, 10/05/2019, 03/05/2020   Unspecified SARS-COV-2 Vaccination 09/10/2019, 10/05/2019    Family History  Problem Relation Age of Onset   Prostate cancer Father    Healthy  Daughter    Epilepsy Son    Healthy Son    Allergic rhinitis Neg Hx    Angioedema Neg Hx    Asthma Neg Hx    Eczema Neg Hx    Immunodeficiency Neg Hx    Urticaria Neg Hx      Current Outpatient Medications:    apixaban (ELIQUIS) 2.5 MG TABS tablet, Take 1 tablet (2.5 mg total) by mouth 2 (two) times daily., Disp: 60 tablet, Rfl: 11   atorvastatin (LIPITOR) 20 MG tablet, Take 1 tablet (20 mg total) by mouth daily after supper., Disp: 30 tablet, Rfl: 0   carbidopa-levodopa (SINEMET CR) 50-200 MG tablet, TAKE 1 TABLET BY MOUTH AT BEDTIME, Disp: 90 tablet, Rfl: 0   carbidopa-levodopa (SINEMET IR) 25-100 MG tablet, TAKE 1 AND 1/2 TABLETS BY MOUTH THREE TIMES DAILY, Disp: 405 tablet, Rfl: 0   CRANBERRY PO, Take 300 mg daily by mouth., Disp: , Rfl:    Cyanocobalamin (VITAMIN B-12 PO), Take 1,200 mcg daily by mouth. , Disp: , Rfl:    denosumab (PROLIA) 60 MG/ML SOSY injection, Inject 60 mg into the skin every 6 (six) months., Disp: , Rfl:    docusate sodium (COLACE) 100 MG capsule, Take 100 mg by mouth daily as needed for mild constipation., Disp: , Rfl:    EPINEPHrine 0.3 mg/0.3 mL IJ SOAJ injection, Inject 0.3 mg into the muscle once as needed for up to 1 dose (anaphylaxis)., Disp: 2 each, Rfl: 1   Ergocalciferol (VITAMIN D2) 2000 units TABS, Take 2,000 Units daily by mouth. , Disp: , Rfl:    furosemide (LASIX) 20 MG tablet, Take 20 mg by mouth every Monday, Wednesday, and Friday., Disp: , Rfl:    KLOR-CON M10 10 MEQ tablet, Take 10 mEq by mouth 3 (three) times a week., Disp: , Rfl:    omeprazole (PRILOSEC) 40 MG capsule, Take 40 mg by mouth continuous as needed  (indigestion)., Disp: , Rfl:    propranolol ER (INDERAL LA) 80 MG 24 hr capsule, TAKE 1 CAPSULE(80 MG) BY MOUTH DAILY (Patient taking differently: Take 80 mg by mouth daily.), Disp: 90 capsule, Rfl: 3   saccharomyces boulardii (FLORASTOR) 250 MG capsule, Take 250 mg by mouth 3 (three) times a week., Disp: , Rfl:       Objective:   Vitals:   10/21/23 1604  BP: 122/70  Pulse: 63  SpO2: 97%  Weight: 163 lb 6.4 oz (74.1 kg)  Height: 5\' 4"  (1.626 m)    Estimated body mass index is 28.05 kg/m as calculated from the following:   Height as of this encounter: 5\' 4"  (1.626 m).   Weight as of this encounter: 163 lb 6.4 oz (74.1 kg).  @WEIGHTCHANGE @  Filed Weights   10/21/23 1604  Weight: 163 lb 6.4 oz (74.1 kg)     Physical Exam   General: No distress. Looks well O2 at rest: no Cane present: YES Sitting in wheel chair: no Frail: no Obese: no Neuro: Alert and Oriented x 3. GCS 15. Speech normal Psych: Pleasant Resp:  Barrel Chest - no.  Wheeze - no, Crackles - no, No overt respiratory distress CVS: Normal heart sounds. Murmurs - no Ext: Stigmata of Connective Tissue Disease - no. HEENT: Normal upper airway. PEERL +. No post nasal drip        Assessment:       ICD-10-CM   1. History of pulmonary embolism  Z86.711 NM Pulmonary Perfusion    D-Dimer, Quantitative    DG  Chest 2 View    B Nat Peptide    ECHOCARDIOGRAM COMPLETE    2. Orthostatic dizziness  R42 NM Pulmonary Perfusion    D-Dimer, Quantitative    DG Chest 2 View    B Nat Peptide    ECHOCARDIOGRAM COMPLETE    3. DOE (dyspnea on exertion)  R06.09 NM Pulmonary Perfusion    D-Dimer, Quantitative    DG Chest 2 View    B Nat Peptide    ECHOCARDIOGRAM COMPLETE         Plan:     Patient Instructions     ICD-10-CM   1. History of pulmonary embolism  Z86.711     2. Orthostatic dizziness  R42     3. DOE (dyspnea on exertion)  R06.09       #Pulmonary embolism  - You have completed 1 year of high  dose/full dose Eliquis through May 2024 - You have completed the second year on low-dose Eliquis as of March 2024 -But  you seem hobbled by shortness of breath on exertion and also orthostatic dizziness  Plan  - Check for evidence of pulmonary hypertension and PE  -Do echocardiogram  -VQ scan -Do D-dimer test and blood BNP  - IF all are normal we can have a conversation about stopping Eliquis in the summer 2025  #Orthostatic dizziness  -Your blood pressure drops significantly at least 40 points when you stand up and your heart rate goes up at least 10 points -I think this is playing a role in your dizziness  Plan  - Talk to PCP and reduce or stop your propranolol  Follouwop -2-3 months or sooner with Dr. Sherrin Daisy Return in about 10 weeks (around 12/30/2023) for 15 min visit, with Dr Marchelle Gearing, Face to Face Visit.    SIGNATURE    Dr. Kalman Shan, M.D., F.C.C.P,  Pulmonary and Critical Care Medicine Staff Physician, [facility] Director - Interstitial Lung Disease  Program  Pulmonary Fibrosis [facility] at [facility] [place], [place], [address]  Pager: [phone], If no answer or between  15:00h - 7:00h: call [phone] Telephone: [phone]  4:46 PM 10/21/2023

## 2023-10-22 DIAGNOSIS — R42 Dizziness and giddiness: Secondary | ICD-10-CM | POA: Diagnosis not present

## 2023-10-22 DIAGNOSIS — Z86711 Personal history of pulmonary embolism: Secondary | ICD-10-CM | POA: Diagnosis not present

## 2023-10-22 DIAGNOSIS — R0609 Other forms of dyspnea: Secondary | ICD-10-CM | POA: Diagnosis not present

## 2023-10-22 LAB — D-DIMER, QUANTITATIVE: D-Dimer, Quant: 0.2 ug{FEU}/mL (ref <0.50)

## 2023-10-22 LAB — BRAIN NATRIURETIC PEPTIDE: Pro B Natriuretic peptide (BNP): 209 pg/mL — ABNORMAL HIGH (ref 0.0–100.0)

## 2023-10-23 DIAGNOSIS — M1712 Unilateral primary osteoarthritis, left knee: Secondary | ICD-10-CM | POA: Diagnosis not present

## 2023-10-23 DIAGNOSIS — M25552 Pain in left hip: Secondary | ICD-10-CM | POA: Diagnosis not present

## 2023-10-27 ENCOUNTER — Ambulatory Visit (HOSPITAL_COMMUNITY)
Admission: RE | Admit: 2023-10-27 | Discharge: 2023-10-27 | Disposition: A | Discharge status: Home / Self Care | Source: Ambulatory Visit | Attending: Internal Medicine | Admitting: Internal Medicine

## 2023-10-27 ENCOUNTER — Encounter (HOSPITAL_COMMUNITY)
Admission: RE | Admit: 2023-10-27 | Discharge: 2023-10-27 | Disposition: A | Discharge status: Home / Self Care | Source: Ambulatory Visit | Attending: Internal Medicine | Admitting: Internal Medicine

## 2023-10-27 DIAGNOSIS — Z86711 Personal history of pulmonary embolism: Secondary | ICD-10-CM | POA: Insufficient documentation

## 2023-10-27 DIAGNOSIS — R0609 Other forms of dyspnea: Secondary | ICD-10-CM | POA: Diagnosis not present

## 2023-10-27 DIAGNOSIS — R42 Dizziness and giddiness: Secondary | ICD-10-CM

## 2023-10-27 MED ORDER — TECHNETIUM TO 99M ALBUMIN AGGREGATED
4.3600 | Freq: Once | INTRAVENOUS | Status: AC | PRN
  Administered 2023-10-27: 4.36 via INTRAVENOUS

## 2023-11-03 ENCOUNTER — Telehealth: Payer: Self-pay

## 2023-11-03 ENCOUNTER — Other Ambulatory Visit: Payer: Self-pay | Admitting: Neurology

## 2023-11-03 DIAGNOSIS — G20A1 Parkinson's disease without dyskinesia, without mention of fluctuations: Secondary | ICD-10-CM

## 2023-11-03 NOTE — Telephone Encounter (Signed)
   D-duimer - NORMAL  BNP - slight high  Plan Await VQ and echo We can interpret these results based on those tests  The story is not complete yet

## 2023-11-03 NOTE — Telephone Encounter (Signed)
 Spoke with patient regarding prior message .Patient stated she was calling regarding bloodwork that was done on 10/22/2023.  Dr.Ramaswamy can you please advise  Thank you

## 2023-11-04 NOTE — Telephone Encounter (Signed)
Spoke with pt and notified of results per Dr. Ramaswamy. Pt verbalized understanding and denied any questions.  

## 2023-11-10 ENCOUNTER — Telehealth: Payer: Self-pay | Admitting: Internal Medicine

## 2023-11-10 DIAGNOSIS — R0609 Other forms of dyspnea: Secondary | ICD-10-CM

## 2023-11-10 DIAGNOSIS — Z86711 Personal history of pulmonary embolism: Secondary | ICD-10-CM

## 2023-11-10 NOTE — Telephone Encounter (Signed)
  VQ scan  INTERMEDIATE PROB  Called patient and gave resuylts.  She is stable She is already on eliquis for prio PE  'plan  - get ECHO; arleady o nschedule - rule out chronic PE  - Get DOppler LE      Narrative & Impression  CLINICAL DATA:  Lightheadedness.  Orthostatic hypertension.   EXAM: NUCLEAR MEDICINE PERFUSION LUNG SCAN   TECHNIQUE: Perfusion images were obtained in multiple projections after intravenous injection of radiopharmaceutical.   RADIOPHARMACEUTICALS:  4.4 mCi Tc-53m MAA   COMPARISON:  None Available.   FINDINGS: Perfusion defect in the posterosuperior aspect of the LEFT upper lobe seen on LPO projection and posterior projection. No wedge-shaped peripheral region defects in lower lobes. Chest x-ray clear.   IMPRESSION: Intermediate probability of pulmonary embolism. Single perfusion defect in the posterior aspect of the LEFT upper lobe.     Electronically Signed   By: Genevive Bi M.D.   On: 11/10/2023 08:31

## 2023-11-12 ENCOUNTER — Ambulatory Visit (HOSPITAL_COMMUNITY)
Admission: RE | Admit: 2023-11-12 | Discharge: 2023-11-12 | Disposition: A | Discharge status: Home / Self Care | Source: Ambulatory Visit | Attending: Internal Medicine | Admitting: Internal Medicine

## 2023-11-12 ENCOUNTER — Telehealth: Payer: Self-pay | Admitting: *Deleted

## 2023-11-12 DIAGNOSIS — R0609 Other forms of dyspnea: Secondary | ICD-10-CM | POA: Diagnosis not present

## 2023-11-12 DIAGNOSIS — R42 Dizziness and giddiness: Secondary | ICD-10-CM | POA: Diagnosis not present

## 2023-11-12 DIAGNOSIS — R609 Edema, unspecified: Secondary | ICD-10-CM | POA: Diagnosis not present

## 2023-11-12 DIAGNOSIS — Z86711 Personal history of pulmonary embolism: Secondary | ICD-10-CM | POA: Diagnosis not present

## 2023-11-12 NOTE — Progress Notes (Signed)
No dvt

## 2023-11-12 NOTE — Telephone Encounter (Signed)
 Copied from CRM [id number]. Topic: Referral - Question >> Nov 11, 2023  1:37 PM Isabell A wrote: Reason for CRM: Patient is requesting to speak with Dr.Ramaswamy's nurse in regard to the referral to check if there are clots in her legs.  Callback number: [phone]  Called and spoke with the pt  She had called about order for venous dopplers and when this test would be scheduled  Since the time she originally called, the test has been scheduled, so nothing further needed

## 2023-11-13 ENCOUNTER — Ambulatory Visit (HOSPITAL_BASED_OUTPATIENT_CLINIC_OR_DEPARTMENT_OTHER)

## 2023-11-13 DIAGNOSIS — R0609 Other forms of dyspnea: Secondary | ICD-10-CM

## 2023-11-13 DIAGNOSIS — Z86711 Personal history of pulmonary embolism: Secondary | ICD-10-CM | POA: Diagnosis not present

## 2023-11-13 DIAGNOSIS — R42 Dizziness and giddiness: Secondary | ICD-10-CM

## 2023-11-13 LAB — ECHOCARDIOGRAM COMPLETE
Area-P 1/2: 2.19 cm2
P 1/2 time: 1983 ms
S' Lateral: 2.3 cm

## 2023-11-14 ENCOUNTER — Encounter: Payer: Self-pay | Admitting: Internal Medicine

## 2023-11-14 NOTE — Progress Notes (Signed)
 No DVT

## 2023-11-18 ENCOUNTER — Encounter (HOSPITAL_COMMUNITY)

## 2023-11-21 ENCOUNTER — Other Ambulatory Visit: Payer: Self-pay | Admitting: Neurology

## 2023-11-21 DIAGNOSIS — G20A1 Parkinson's disease without dyskinesia, without mention of fluctuations: Secondary | ICD-10-CM

## 2023-12-15 DIAGNOSIS — N8111 Cystocele, midline: Secondary | ICD-10-CM | POA: Diagnosis not present

## 2023-12-15 DIAGNOSIS — Z4689 Encounter for fitting and adjustment of other specified devices: Secondary | ICD-10-CM | POA: Diagnosis not present

## 2023-12-15 DIAGNOSIS — N3941 Urge incontinence: Secondary | ICD-10-CM | POA: Diagnosis not present

## 2023-12-16 ENCOUNTER — Other Ambulatory Visit: Payer: Self-pay | Admitting: Neurology

## 2023-12-16 DIAGNOSIS — G20A1 Parkinson's disease without dyskinesia, without mention of fluctuations: Secondary | ICD-10-CM

## 2024-01-05 DIAGNOSIS — H5212 Myopia, left eye: Secondary | ICD-10-CM | POA: Diagnosis not present

## 2024-01-05 DIAGNOSIS — H524 Presbyopia: Secondary | ICD-10-CM | POA: Diagnosis not present

## 2024-01-05 DIAGNOSIS — H26491 Other secondary cataract, right eye: Secondary | ICD-10-CM | POA: Diagnosis not present

## 2024-01-05 DIAGNOSIS — Z961 Presence of intraocular lens: Secondary | ICD-10-CM | POA: Diagnosis not present

## 2024-01-11 ENCOUNTER — Ambulatory Visit: Admitting: Internal Medicine

## 2024-01-20 ENCOUNTER — Ambulatory Visit
Admission: EM | Admit: 2024-01-20 | Discharge: 2024-01-20 | Disposition: A | Discharge status: Home / Self Care | Attending: Internal Medicine | Admitting: Internal Medicine

## 2024-01-20 ENCOUNTER — Encounter: Payer: Self-pay | Admitting: Emergency Medicine

## 2024-01-20 DIAGNOSIS — L600 Ingrowing nail: Secondary | ICD-10-CM | POA: Diagnosis not present

## 2024-01-20 MED ORDER — CEPHALEXIN 250 MG PO CAPS
250.0000 mg | ORAL_CAPSULE | Freq: Two times a day (BID) | ORAL | 0 refills | Status: AC
Start: 2024-01-20 — End: 2024-01-27

## 2024-01-20 NOTE — ED Triage Notes (Signed)
 Pt c/o left big toe pain and redness for over 1 week. Pt states today was first day she noticed it bleeding.   Denies any injury

## 2024-01-20 NOTE — Discharge Instructions (Signed)
 Your left great toenail is ingrown and this has caused a skin infection. Take keflex antibiotic every 12 hours for the next 7 days to treat infection. Please take this with food and a probiotic to prevent stomach upset and diarrhea. Soak the toe in warm water with epsom salt every 4-6 hours or so throughout the day to reduce swelling and pain. You may take tylenol  1,000mg  every 6 hours as needed for pain.  I have placed a referral to podiatry for follow-up. They will likely need to excise (remove) the ingrown portion of the toenail. You may call [facility] to set up an appointment for follow-up.  If you develop fever, chills, streaking redness to the top of the left foot or toe, or any new/worsening symptoms, please return to urgent care for re-evaluation. Otherwise, follow-up with PCP/foot doctor.

## 2024-01-20 NOTE — ED Provider Notes (Signed)
 Linda Lee    CSN: [id number] Arrival date & time: 01/20/24  1252      History   Chief Complaint Chief Complaint  Patient presents with   Toe Pain    HPI MARLAINA Lee is a 88 y.o. female.   Patient presents to urgent care for evaluation of pain and swelling to the distal lateral portion of the left great toe/toenail starting approximately 1 week ago.  She noticed a small amount of bleeding from the tip of the left great toenail this morning.  Denies known recent trauma/injuries to the left great toe, streaking redness to the proximal portion of the left great toe/the left foot, and recent antibiotic use in the last 90 days.  She has not trimmed her toenails recently.  Denies numbness and tingling distally to pain/swelling.  She has not noticed any purulent drainage from the site of concern.  Denies recent fever, chills, nausea, vomiting, and bodyaches.   Toe Pain    Past Medical History:  Diagnosis Date   Anginal pain (HCC)    Angio-edema    Arthritis    Atypical chest pain 07/06/2009   Qualifier: Diagnosis of  By: Abel Hoe, MD, Christopher     GERD (gastroesophageal reflux disease)    Hyperlipemia    Migraines    Toxic effect of venom of bees, unintentional 06/03/2017    Patient Active Problem List   Diagnosis Date Noted   Fatigue 04/29/2022   Osteoporosis 02/21/2022   Lung infiltrate 02/11/2022   Acute pulmonary embolism (HCC) 01/19/2022   Elevated troponin level not due myocardial infarction 01/19/2022   Asymptomatic bacteriuria 01/19/2022   Nonsustained ventricular tachycardia (HCC) 10/06/2018   Migraine 10/06/2018   History of TIA (transient ischemic attack) 10/05/2018   Coronary artery calcification seen on CT scan 10/05/2018   Parkinson disease (HCC) 12/10/2017   Carpal tunnel syndrome of left wrist 12/08/2017   Ulnar neuropathy 12/08/2017   Dyspnea on exertion 06/17/2017   OA (osteoarthritis) of knee 09/25/2015   Lower urinary tract  infectious disease 09/04/2012   Diastolic CHF, chronic (HCC) 09/04/2012   Subjective visual disturbance, right eye 09/03/2012   Vision disturbance 09/03/2012   Ascending aortic aneurysm (HCC) 07/06/2009   Mixed hyperlipidemia 06/30/2009   GERD without esophagitis 06/30/2009   LEG CRAMPS 06/30/2009    Past Surgical History:  Procedure Laterality Date   ADENOIDECTOMY     BACK SURGERY     HEMORRHOID SURGERY     RIGHT/LEFT HEART CATH AND CORONARY ANGIOGRAPHY N/A 06/17/2017   Procedure: RIGHT/LEFT HEART CATH AND CORONARY ANGIOGRAPHY;  Surgeon: Knox Perl, MD;  Location: [facility];  Service: Cardiovascular;  Laterality: N/A;   Thoracicc Aortic Leldon Push 07/2009     denies surgery for this   TONSILLECTOMY     TOTAL KNEE ARTHROPLASTY Right 09/25/2015   Procedure: TOTAL KNEE ARTHROPLASTY;  Surgeon: Liliane Rei, MD;  Location: [facility];  Service: Orthopedics;  Laterality: Right;    OB History   No obstetric history on file.      Home Medications    Prior to Admission medications   Medication Sig Start Date End Date Taking? Authorizing Provider  cephALEXin (KEFLEX) 250 MG capsule Take 1 capsule (250 mg total) by mouth 2 (two) times daily for 7 days. 01/20/24 01/27/24 Yes Starlene Eaton, FNP  apixaban  (ELIQUIS ) 2.5 MG TABS tablet Take 1 tablet (2.5 mg total) by mouth 2 (two) times daily. 01/22/23   Cobb, Mariah Shines, NP  atorvastatin  (LIPITOR) 20  MG tablet Take 1 tablet (20 mg total) by mouth daily after supper. 08/11/18   Mesner, Reymundo Caulk, MD  carbidopa -levodopa  (SINEMET  CR) 50-200 MG tablet TAKE 1 TABLET BY MOUTH AT BEDTIME 11/03/23   Tat, Von Grumbling, DO  carbidopa -levodopa  (SINEMET  IR) 25-100 MG tablet TAKE 1 AND 1/2 TABLETS BY MOUTH THREE TIMES DAILY 12/16/23   Tat, Von Grumbling, DO  CRANBERRY PO Take 300 mg daily by mouth.    [provider]  Cyanocobalamin (VITAMIN B-12 PO) Take 1,200 mcg daily by mouth.     [provider]  denosumab  (PROLIA ) 60 MG/ML SOSY  injection Inject 60 mg into the skin every 6 (six) months.    [provider]  docusate sodium  (COLACE) 100 MG capsule Take 100 mg by mouth daily as needed for mild constipation.    [provider]  EPINEPHrine  0.3 mg/0.3 mL IJ SOAJ injection Inject 0.3 mg into the muscle once as needed for up to 1 dose (anaphylaxis). 02/07/21   Tinnie Forehand, FNP  Ergocalciferol  (VITAMIN D2) 2000 units TABS Take 2,000 Units daily by mouth.     [provider]  furosemide  (LASIX ) 20 MG tablet Take 20 mg by mouth every Monday, Wednesday, and Friday.    [provider]  KLOR-CON  M10 10 MEQ tablet Take 10 mEq by mouth 3 (three) times a week. 05/11/19   [provider]  omeprazole  (PRILOSEC ) 40 MG capsule Take 40 mg by mouth continuous as needed (indigestion). 06/15/18   [provider]  propranolol  ER (INDERAL  LA) 80 MG 24 hr capsule TAKE 1 CAPSULE(80 MG) BY MOUTH DAILY Patient taking differently: Take 80 mg by mouth daily. 05/15/21   Knox Perl, MD  saccharomyces boulardii (FLORASTOR) 250 MG capsule Take 250 mg by mouth 3 (three) times a week.    [provider]    Family History Family History  Problem Relation Age of Onset   Prostate cancer Father    Healthy Daughter    Epilepsy Son    Healthy Son    Allergic rhinitis Neg Hx    Angioedema Neg Hx    Asthma Neg Hx    Eczema Neg Hx    Immunodeficiency Neg Hx    Urticaria Neg Hx     Social History Social History   Tobacco Use   Smoking status: Former    Current packs/day: 0.00    Average packs/day: 0.3 packs/day for 5.0 years (1.3 ttl pk-yrs)    Types: Cigarettes    Start date: 08/14/1958    Quit date: 08/15/1963    Years since quitting: 60.4   Smokeless tobacco: Never  Vaping Use   Vaping status: Never Used  Substance Use Topics   Alcohol  use: Yes    Alcohol /week: 1.0 standard drink of alcohol     Types: 1 Glasses of wine per week    Comment: q hs   Drug use: No     Allergies    Ciprofloxacin , Wasp venom, and Fluconazole   Review of Systems Review of Systems Per HPI  Physical Exam Triage Vital Signs ED Triage Vitals  Encounter Vitals Group     BP 01/20/24 1300 105/65     Girls Systolic BP Percentile --      Girls Diastolic BP Percentile --      Boys Systolic BP Percentile --      Boys Diastolic BP Percentile --      Pulse Rate 01/20/24 1300 74     Resp 01/20/24 1300 17  Temp 01/20/24 1300 97.6 F (36.4 C)     Temp Source 01/20/24 1300 Oral     SpO2 01/20/24 1300 97 %     Weight --      Height --      Head Circumference --      Peak Flow --      Pain Score 01/20/24 1305 2     Pain Loc --      Pain Education --      Exclude from Growth Chart --    No data found.  Updated Vital Signs BP 105/65 (BP Location: Right Arm)   Pulse 74   Temp 97.6 F (36.4 C) (Oral)   Resp 17   SpO2 97%   Visual Acuity Right Eye Distance:   Left Eye Distance:   Bilateral Distance:    Right Eye Near:   Left Eye Near:    Bilateral Near:     Physical Exam Vitals and nursing note reviewed.  Constitutional:      Appearance: She is not ill-appearing or toxic-appearing.  HENT:     Head: Normocephalic and atraumatic.     Right Ear: Hearing and external ear normal.     Left Ear: Hearing and external ear normal.     Nose: Nose normal.     Mouth/Throat:     Lips: Pink.   Eyes:     General: Lids are normal. Vision grossly intact. Gaze aligned appropriately.     Extraocular Movements: Extraocular movements intact.     Conjunctiva/sclera: Conjunctivae normal.    Cardiovascular:     Pulses:          Dorsalis pedis pulses are 2+ on the left side.       Posterior tibial pulses are 2+ on the left side.  Pulmonary:     Effort: Pulmonary effort is normal.   Musculoskeletal:     Cervical back: Neck supple.  Feet:     Left foot:     Skin integrity: Erythema present.     Toenail Condition: Left toenails are ingrown.     Comments: Ingrown toenail to be  distal lateral portion of the left great toe with scant bloody drainage and purulence.  No palpable abscess.  Tender to palpation at distal lateral pad of left great toe.  Less than 2 cap refill to affected digit.  Sensation intact distally.  Skin:    General: Skin is warm and dry.     Capillary Refill: Capillary refill takes less than 2 seconds.     Findings: No rash.   Neurological:     General: No focal deficit present.     Mental Status: She is alert and oriented to person, place, and time. Mental status is at baseline.     Cranial Nerves: No dysarthria or facial asymmetry.   Psychiatric:        Mood and Affect: Mood normal.        Speech: Speech normal.        Behavior: Behavior normal.        Thought Content: Thought content normal.        Judgment: Judgment normal.      Lee Treatments / Results  Labs (all labs ordered are listed, but only abnormal results are displayed) Labs Reviewed - No data to display  EKG   Radiology No results found.  Procedures Procedures (including critical care time)  Medications Ordered in Lee Medications - No data to display  Initial Impression / Assessment  and Plan / Lee Course  I have reviewed the triage vital signs and the nursing notes.  Pertinent labs & imaging results that were available during my care of the patient were reviewed by me and considered in my medical decision making (see chart for details).   1.  Ingrown toenail of left foot with infection Pain and swelling are secondary to infection due to ingrown toenail of the left distal lateral great toe. We will treat this with use of cephalexin antibiotic 250 mg every 12 hours for 7 days.  Reviewed most recent renal function available on file showing normal GFR/creatinine.  Patient is hesitant to take antibiotics due to history of C. difficile, however I have discussed that she may take probiotic and eat food with this to prevent GI side effects. Epsom salt soaks encouraged.   Tylenol  Extra Strength every 6 hours as needed for pain. Referral to [facility] placed for excision of ingrown toenail. Patient given information to call [facility] as well to set up an appointment for follow-up.  Infection return precautions discussed.  Counseled patient on potential for adverse effects with medications prescribed/recommended today, strict ER and return-to-clinic precautions discussed, patient verbalized understanding.    Final Clinical Impressions(s) / Lee Diagnoses   Final diagnoses:  Ingrown toenail of left foot with infection     Discharge Instructions      Your left great toenail is ingrown and this has caused a skin infection. Take keflex antibiotic every 12 hours for the next 7 days to treat infection. Please take this with food and a probiotic to prevent stomach upset and diarrhea. Soak the toe in warm water with epsom salt every 4-6 hours or so throughout the day to reduce swelling and pain. You may take tylenol  1,000mg  every 6 hours as needed for pain.  I have placed a referral to podiatry for follow-up. They will likely need to excise (remove) the ingrown portion of the toenail. You may call [facility] to set up an appointment for follow-up.  If you develop fever, chills, streaking redness to the top of the left foot or toe, or any new/worsening symptoms, please return to urgent care for re-evaluation. Otherwise, follow-up with PCP/foot doctor.    ED Prescriptions     Medication Sig Dispense Auth. Provider   cephALEXin (KEFLEX) 250 MG capsule Take 1 capsule (250 mg total) by mouth 2 (two) times daily for 7 days. 14 capsule Starlene Eaton, FNP      PDMP not reviewed this encounter.   Starlene Eaton, [place] 01/20/24 1349

## 2024-01-26 ENCOUNTER — Ambulatory Visit: Admitting: Podiatry

## 2024-01-26 DIAGNOSIS — L600 Ingrowing nail: Secondary | ICD-10-CM

## 2024-01-26 DIAGNOSIS — L03032 Cellulitis of left toe: Secondary | ICD-10-CM

## 2024-01-26 DIAGNOSIS — M51369 Other intervertebral disc degeneration, lumbar region without mention of lumbar back pain or lower extremity pain: Secondary | ICD-10-CM | POA: Insufficient documentation

## 2024-01-26 DIAGNOSIS — M48061 Spinal stenosis, lumbar region without neurogenic claudication: Secondary | ICD-10-CM | POA: Insufficient documentation

## 2024-01-26 MED ORDER — METRONIDAZOLE 500 MG PO TABS: 500.0000 mg | ORAL_TABLET | Freq: Two times a day (BID) | ORAL | 0 refills | Status: AC

## 2024-01-26 NOTE — Progress Notes (Signed)
 Subjective:  Patient ID: Linda Lee, female    DOB: Feb 13, 1936,  MRN: [id number]  Linda Lee presents to clinic today for:  Chief Complaint  Patient presents with   Nail Problem    Left hallux possible ingrown nail  Pt stated it has been a few weeks and she has had some bleeding    Patient presents today with concern of an infected ingrown toenail to the left great toe along the lateral nail border.  She was not sure if it was infected from the ingrown toenail or something else.  Does not recall an injury but states that she could have possibly stubbed the toe that started the process.  She notes her second toe sometimes sits on top of the big toe.  She was placed on oral antibiotics from an urgent care facility near her home.  She does have a history of C. difficile and started developing some diarrhea yesterday so she stopped taking the antibiotic.  She does not have any Flagyl at home.  Her daughter is a Development worker, community.  Allergies  Allergen Reactions   Ciprofloxacin  Other (See Comments)    Can not take due to hx of Cdiff per patient   Wasp Venom Other (See Comments)    dizziness   Wasp Venom Protein     Other Reaction(s): Not available   Fluconazole Rash    Objective:  Linda Lee is a pleasant 88 y.o. female in NAD. AAO x 3.  Vascular Examination: Capillary refill time is 3-5 seconds to toes bilateral. Palpable pedal pulses b/l LE. Digital hair present b/l. No pedal edema b/l. Skin temperature gradient WNL b/l. No varicosities b/l. No cyanosis or clubbing noted b/l.   Dermatological Examination: There is incurvation of the left hallux lateral nail border.  There is pain on palpation of the affected nail border.  There is localized erythema, edema, and calor to the area.  No active drainage is noted.  There is some dried blood present along the lateral nail margin.  The right hallux nail also has some curving of the toenail along the medial and lateral  borders.  No signs of paronychia are seen.  Neurological Examination: Epicritic sensation is intact to the toes.  Assessment/Plan: 1. Ingrown toenail     Meds ordered this encounter  Medications   metroNIDAZOLE (FLAGYL) 500 MG tablet    Sig: Take 1 tablet (500 mg total) by mouth 2 (two) times daily for 7 days.    Dispense:  14 tablet    Refill:  0   Discussed patient's condition today.  After obtaining patient consent, the left hallux was anesthetized with a 50:50 mixture of 1% lidocaine  plain and 0.5% bupivacaine  plain for a total of 3cc's administered.  Upon confirmation of anesthesia, a freer elevator was utilized to free the left hallux lateral nail border from the nail bed.  The nail border was then avulsed proximal to the eponychium and removed in toto.  The area was inspected for any remaining spicules.  A chemical matrixectomy was performed with NaOH and neutralized with acetic acid solution.  Antibiotic ointment and a DSD were applied, followed by a Coban dressing.  Patient tolerated the anesthetic and procedure well and will f/u in 2-3 weeks for recheck.  Patient given post-procedure instructions for daily 15-minute Epsom salt soaks, antibiotic ointment and daily use of Bandaids until toe starts to dry / form eschar.   Informed the patient that I recommend that  she hold off on having any procedures on the right hallux nail until a future visit when there is not a current infection on the contralateral limb.  It is less likely that this will develop a postprocedure infection if performed without any other current infection.  This nail border was trimmed back uneventfully.  Prescription for Flagyl 500 mg 1 tablet p.o. twice daily sent in to try to curtail at the start of C. difficile.  If she does worsen then she can call our office or go to the nearest emergency room.  Follow-up in 2 weeks for PNA recheck.   Linda Lee, DPM, FACFAS [facility]     [address] [place], [place] [address]                Office [phone]  Fax [phone]

## 2024-01-26 NOTE — Patient Instructions (Signed)
 STARTING TOMORROW: Place 1/4 cup of epsom salts in a quart of warm tap water.  Submerge your foot, or feet, in the solution and soak for 20 minutes.  This soak can be performed once or twice a day.  Next, remove your foot or feet from solution, blot dry the affected area.   IF YOUR SKIN BECOMES IRRITATED WHILE USING THESE INSTRUCTIONS, IT IS OKAY TO SWITCH TO  WHITE VINEGAR AND WATER instead of Epsom salt.  As another alternative soak, you may use antibacterial soap and water.  Dry the area well after soaking.  Apply a small amount of antibiotic ointment (ie:  Neosporin, Triple Antibiotic ointment, or Bacitracin) to the area, followed by a Bandaid or light gauze dressing.  As the area starts to dry out over the next few days, you can remove the covering at bedtime to encourage the toe to dry out more.  Once it forms a dry scab, you can discontinue these instructions.  Monitor for any signs/symptoms of infection. Call the office immediately if any occur or go directly to the emergency room. Call with any questions/concerns.

## 2024-01-27 ENCOUNTER — Ambulatory Visit: Admitting: Internal Medicine

## 2024-01-29 ENCOUNTER — Ambulatory Visit: Admitting: Internal Medicine

## 2024-01-29 ENCOUNTER — Encounter: Payer: Self-pay | Admitting: Internal Medicine

## 2024-01-29 ENCOUNTER — Telehealth: Payer: Self-pay | Admitting: Internal Medicine

## 2024-01-29 VITALS — BP 142/68 | HR 61 | Ht 63.5 in | Wt 159.8 lb

## 2024-01-29 DIAGNOSIS — Z86711 Personal history of pulmonary embolism: Secondary | ICD-10-CM

## 2024-01-29 DIAGNOSIS — I5189 Other ill-defined heart diseases: Secondary | ICD-10-CM

## 2024-01-29 DIAGNOSIS — R42 Dizziness and giddiness: Secondary | ICD-10-CM | POA: Diagnosis not present

## 2024-01-29 DIAGNOSIS — Z87891 Personal history of nicotine dependence: Secondary | ICD-10-CM | POA: Diagnosis not present

## 2024-01-29 DIAGNOSIS — R7989 Other specified abnormal findings of blood chemistry: Secondary | ICD-10-CM

## 2024-01-29 NOTE — Telephone Encounter (Signed)
 Please send in a prescription for plain propranolol  10 mg to take it 3 times a day for 1 week followed by 10 mg twice daily for 2 to 3 days and then stop.  Discontinue propranolol  XL 80 mg daily.  Indication to stop is due to severe orthostatic hypotension.  The medication was started due to moderate-sized ascending aortic aneurysm in the past however aneurysm has remained stable.

## 2024-01-29 NOTE — Progress Notes (Signed)
 Referring provider: Shayne Anes, MD  HPI: 88 year old female, former smoker who is a retired Runner, broadcasting/film/video followed for pulmonary embolism. Last seen 02/11/2022.  She was admitted from 01/19/2022 to 01/22/2022 for unprovoked acute submassive pulmonary embolism and associated acute hypoxic respiratory failure.  Past medical history significant for Parkinson's disease, lumbar spinal stenosis, osteoarthritis, AAA without rupture, diastolic CHF, migraines, GERD, HLD.  TEST/EVENTS:  01/19/2022 CTA chest: multiple bilateral pulmonary arterial filling defcts. RV to LV ratio is 1:12. Interval increase in diameter of PA. 4.3 ascending thoracic aortic aneurysm, stable up to 10 years. No LAD. Minimal b/l dependent atelectasis. Interval wedge-shaped area of consolidation in the anteromedial aspect of the RLL with air bronchogram. Interval minimal irregular patchy density more superiorly and laterally in the RLL.  01/20/2022 echocardiogram: EF 60-65%; G1DD. RV size and function nl. Severely elevated PASP. Trivial MR. Moderate TR. AR mild. Mild dilatation of the ascending aorta.  04/01/2022 echocardiogram: EF 60-65%. LVH. RV size and function nl. Unable to measure PASP. Trivial MR. AR mild to moderate.   01/19/2022-01/22/2022: Hospitalization for acute respiratory failure related to unprovoked acute submassive PE with right heart strain on CTA. She had echocardiogram with normal right sided function and severely elevated PASP. She was treated with heparin  drip and transitioned to PO Eliquis . Recommended lifelong as clot was unprovoked. She had RLL infiltrates on her imaging, suspected to be related to PE. She did receiver 1 dose of ceftriaxone  and azithromycin ; recommended outpatient imaging to ensure resolution.   02/11/2022: SHERLEAN with Cobb NP for hospital follow up. She reports feeling better since being home. She still has some fatigue and occasional chest discomfort. She also gets winded more easily with activities than  she prior to this. Her cough is hacking but has cleared up and she is no longer producing sputum. All of her symptoms are steadily improving though and she feels like she is getting some energy back. She would like to go back to her exercise class for people with Parkinson's and wants to know if this would be okay. She denies any bleeding/excessive bruising, hemoptysis, fevers, chills. She is taking her Eliquis  twice daily. Plan to repeat echo in 3 months.  04/29/2022: Today - follow up Patient presents today for follow up.  She has been doing relatively well since I saw her last.  Feels like her breathing is stable.  She still struggling with daily fatigue symptoms.  Feels like she just does not have much energy.  She also admits that she has not been doing much as far as exercise/activity.  Prior to her hospitalization, she was going to a exercise class directed for people with Parkinson's.  She has not returned to this since.  She was working with physical therapy though and felt like this was helping with her strength and back pain.  She denies any bleeding/excessive bruising, hemoptysis, epistaxis, shortness of breath, calf pain/swelling.  She is taking her Eliquis  twice daily.  OV 12/20/2022  Subjective:  Patient ID: Linda Lee, female , DOB: Oct 01, 1935 , age 7 y.o. , MRN: [id number] , ADDRESS: [address] [place] [address] PCP Shayne Anes, MD Patient Care Team: Shayne Anes, MD as PCP - General (Internal Medicine) Tat, Asberry RAMAN, DO as Consulting Physician (Neurology) Asa Aloysius LABOR, MD (Inactive) as Consulting Physician (Allergy and Immunology)  This Provider for this visit: Treatment Team:  Attending Provider: Geronimo Amel, MD    12/20/2022 -   Chief Complaint  Patient presents with  Follow-up    Breathing is overall doing well. She tires easily and states that this is unchanged since her last visit.      HPI ERIELLE GAWRONSKI 88 y.o. -     I  saw her in the hospital.  And since then she has been seen by nurse practitioners.  She comes today for for follow-up.  She continues to do well.  She says she does not have any shortness of breath.  No coughing no wheezing no orthopnea no paroxysmal nocturnal dyspnea no chest pains.  She states that she continues to have fatigue after the PE.  This is her biggest complaint.  She says the fatigue started 3 days before the PE and is still continues although she did admit she was a lot better.  She continues to have chronic pedal edema with varicose veins and all her other medical problems.  She continues to be on Eliquis  she has not had any bleeding episodes she is on Eliquis  full dose.  For the fatigue today we checked her vitamin D  level was normal.  She is on vitamin D  supplements.  She is also on Enbrel.  She also wanted know how long to continue the Eliquis .  I did talk to her that best to continue full dose Eliquis  for 1-2 years but if D-dimer and echo are normal may continue low-dose Eliquis  for another couple of years particularly given her age and risk for bleeding.  She is fine with this approach.     01/22/2023: Today - follow up Patient presents today for follow-up.  She had echocardiogram with some mild dilatation of aortic root, which was unchanged compared to August 2023.  She did not have any significant RV strain.  She did have a prominent RV moderator band visualized.  EF normal.  Tells me today that she is feeling relatively stable compared to her last visit.  Does not have any trouble with her breathing.  She would like to go ahead and move forward with decreasing her Eliquis  if possible.  No recent episodes of bleeding or excessive bruising.  Still having some fatigue symptoms but workup has been unremarkable.  She denies any issues with her sleep at night, snoring, witnessed apneas, morning headaches, drowsy driving.   OV 6/81/7974  Subjective:  Patient ID: Linda Lee, female  , DOB: [birth year] , age 5 y.o. , MRN: [id number] , ADDRESS: [address] [place] [address] PCP Shayne Anes, MD Patient Care Team: Shayne Anes, MD as PCP - General (Internal Medicine) Tat, Asberry RAMAN, DO as Consulting Physician (Neurology) Asa Aloysius LABOR, MD (Inactive) as Consulting Physician (Allergy and Immunology)  This Provider for this visit: Treatment Team:  Attending Provider: Geronimo Amel, MD    10/21/2023 -   Chief Complaint  Patient presents with   Follow-up    Breathing is overall doing well. She still has issues with standing for long periods of time feel like passing out. She has occ cough at night, non prod.      HPI Alix Lahmann Amzizwazmh 88 y.o. -returns for follow-up.  I personally not seen her since May 2024.  She then saw a nurse practitioner June 2024.  She tells me that ever since the admission for pulm embolism in June 2023 she is having orthostatic dizziness and shortness of breath on exertion.  She says she is no longer living upstairs.  She has a two-story home.  She is not able to go up stairs because when  she climbs 1 flight of stairs she is very short of breath.  In addition when she stands up she has orthostatic dizziness as though she is going to pass out but not immediately.  It happens after 2 or 3 minutes.  This is resulted in a fall in July 2024.  But review of the records indicate that she slipped on wet ground in July 2024 while walking the dog and fell.  She followed up in August 2024 with the ER because some some numbness and also reassured at that time.  She denied any shortness of breath to the ER in August 2024 but to me she states that she is short of breath when she exerts flight of stairs.  All this is a change since the PE in summer 2023.  She has slight elevation in BNP May 2024   SUPINE BP/HR  -> 156/82 with HR 60  STNDING 2 min - > 105/71 and HR 72    OV 01/29/2024  Subjective:  Patient ID: Linda Lee, female  , DOB: Dec 10, 1935 , age 29 y.o. , MRN: [id number] , ADDRESS: [address] [place] [address] PCP Shayne Anes, MD Patient Care Team: Shayne Anes, MD as PCP - General (Internal Medicine) Tat, Asberry RAMAN, DO as Consulting Physician (Neurology) Asa Aloysius LABOR, MD (Inactive) as Consulting Physician (Allergy and Immunology)  This Provider for this visit: Treatment Team:  Attending Provider: Geronimo Amel, MD    01/29/2024 -   Chief Complaint  Patient presents with   Follow-up    Breathing is unchanged. Has not seen her PCP yet regarding low BP.    Follow-up submassive PE from June 2023-> May 2024 for D-dimer was normal and we switched to low-dose Eliquis .  New onset orthostatic dizziness in March 2025.  HPI Kylan Veach Amzizwazmh 88 y.o. -presents for follow-up.  She is now completed 2 years of anniversary since pulm embolism.  The first he was on full dose Eliquis .  The second he has been on low-dose Eliquis .  She is tolerating it well.  She did have some toenail issues and there are some mild bleeding but it resolved easily.  Otherwise no issues with low-dose Eliquis .  No shortness of breath.  This visit was to see if she should stop Eliquis  altogether or continue on the low-dose protocol.  She did a D-dimer and this is normal.  She did do echocardiogram and this is normal but VQ scan suggested some chronic PE.  Therefore we took a shared decision making to continue low-dose Eliquis  for another year particular because of good tolerability.  I think she was orthostatic dizziness.  She never followed up with primary care physician.  She said she is just waiting for her routine appointment in 6 months.  I said this was not acceptable.  Then she said that the person who started her propranolol  was Dr. Ladona which she has been discharged from follow-up.  She was also concerned about.  I have sent message to Dr. Ladona personally to evaluate the situation.  She continues to have  orthostatic dizziness when she stoops.     Simple office walk 224 (66+46 x 2) feet Pod A at [facility] x  3 laps goal with forehead probe 12/20/2022    O2 used ra   Number laps completed Sit stand x 10   Comments about pace steady   Resting Pulse Ox/HR 100% and 59/min   Final Pulse Ox/HR 98% and 69/min   Desaturated </=  88% no   Desaturated <= 3% points no   Got Tachycardic >/= 90/min no   Symptoms at end of test none   Miscellaneous comments normal       PFT      No data to display               ECHOCARDIOGRAM REPORT       Patient Name:   MARLET KORTE Date of Exam: 11/13/2023  Medical Rec #:  [id number]          Height:       64.0 in  Accession #:    [id number]         Weight:       163.4 lb  Date of Birth:  05-20-1936         BSA:          1.795 m  Patient Age:    87 years           BP:           118/77 mmHg  Patient Gender: F                  HR:           55 bpm.  Exam Location:  [address]   Procedure: 2D Echo, 3D Echo, Cardiac Doppler, Color Doppler and Strain  Analysis            (Both Spectral and Color Flow Doppler were utilized during             procedure).   Indications:     I26.09 Pulmonary Embolus    History:         Patient has prior history of Echocardiogram examinations,  most                  recent 01/20/2023. CHF, Ascending Aortic Aneurysm,                   Arrythmias:Nonsustaines VT; Signs/Symptoms:Dyspnea.    Sonographer:     Waldo Guadalajara RCS  Referring Phys:  [id number] Renaye Janicki  Diagnosing Phys: [place]   IMPRESSIONS     1. GLS is borderline. Left ventricular ejection fraction, by estimation,  is 60 to 65%. The left ventricle has normal function. The left ventricle  has no regional wall motion abnormalities. Left ventricular diastolic  parameters are consistent with Grade  I diastolic dysfunction (impaired relaxation). The average left  ventricular global longitudinal strain is -17.0 %.   2. Right ventricular  systolic function is normal. The right ventricular  size is normal. There is moderately elevated pulmonary artery systolic  pressure.   3. The mitral valve is normal in structure. No evidence of mitral valve  regurgitation.   4. The aortic valve is tricuspid. Aortic valve regurgitation is moderate.   5. Aneurysm of the ascending aorta, measuring 44 mm.   6. The inferior vena cava is normal in size with greater than 50%  respiratory variability, suggesting right atrial pressure of 3 mmHg.   Comparison(s): No significant change from prior study.   Conclusion(s)/Recommendation(s): +liver cyst.    LAB RESULTS last 96 hours No results found.    IMPRESSION: VQ SCAN 10/27/23 Intermediate probability of pulmonary embolism. Single perfusion defect in the posterior aspect of the LEFT upper lobe.     Electronically Signed   By: Jackquline Boxer M.D.   On: 11/10/2023 08:31    Latest Reference Range & Units  01/19/22 13:20 01/20/22 12:29 01/21/22 03:34 01/22/22 04:18 12/20/22 13:55 10/22/23 13:43  D-Dimer, Quant <0.50 mcg/mL FEU 1.96 (H) 1.47 (H) 1.49 (H) 1.42 (H) 0.20 0.20  (H): Data is abnormally high   has a past medical history of Anginal pain (HCC), Angio-edema, Arthritis, Atypical chest pain (07/06/2009), GERD (gastroesophageal reflux disease), Hyperlipemia, Migraines, and Toxic effect of venom of bees, unintentional (06/03/2017).   reports that she quit smoking about 60 years ago. Her smoking use included cigarettes. She started smoking about 65 years ago. She has a 1.3 pack-year smoking history. She has never used smokeless tobacco.  Past Surgical History:  Procedure Laterality Date   ADENOIDECTOMY     BACK SURGERY     HEMORRHOID SURGERY     RIGHT/LEFT HEART CATH AND CORONARY ANGIOGRAPHY N/A 06/17/2017   Procedure: RIGHT/LEFT HEART CATH AND CORONARY ANGIOGRAPHY;  Surgeon: Ladona Heinz, MD;  Location: [facility];  Service: Cardiovascular;  Laterality: N/A;   Thoracicc Aortic  Derryl 07/2009     denies surgery for this   TONSILLECTOMY     TOTAL KNEE ARTHROPLASTY Right 09/25/2015   Procedure: TOTAL KNEE ARTHROPLASTY;  Surgeon: Dempsey Moan, MD;  Location: [facility];  Service: Orthopedics;  Laterality: Right;    Allergies  Allergen Reactions   Ciprofloxacin  Other (See Comments)    Can not take due to hx of Cdiff per patient   Wasp Venom Other (See Comments)    dizziness   Wasp Venom Protein     Other Reaction(s): Not available   Fluconazole Rash    Immunization History  Administered Date(s) Administered   Fluad Quad(high Dose 65+) 04/20/2019   Influenza, High Dose Seasonal PF 05/05/2018   Influenza,inj,quad, With Preservative 05/06/2019   Influenza-Unspecified 05/06/2023   PFIZER(Purple Top)SARS-COV-2 Vaccination 09/10/2019, 10/05/2019, 03/05/2020   Unspecified SARS-COV-2 Vaccination 09/10/2019, 10/05/2019    Family History  Problem Relation Age of Onset   Prostate cancer Father    Healthy Daughter    Epilepsy Son    Healthy Son    Allergic rhinitis Neg Hx    Angioedema Neg Hx    Asthma Neg Hx    Eczema Neg Hx    Immunodeficiency Neg Hx    Urticaria Neg Hx      Current Outpatient Medications:    apixaban  (ELIQUIS ) 2.5 MG TABS tablet, Take 1 tablet (2.5 mg total) by mouth 2 (two) times daily., Disp: 60 tablet, Rfl: 11   atorvastatin  (LIPITOR) 20 MG tablet, Take 1 tablet (20 mg total) by mouth daily after supper., Disp: 30 tablet, Rfl: 0   carbidopa -levodopa  (SINEMET  CR) 50-200 MG tablet, TAKE 1 TABLET BY MOUTH AT BEDTIME, Disp: 90 tablet, Rfl: 0   carbidopa -levodopa  (SINEMET  IR) 25-100 MG tablet, TAKE 1 AND 1/2 TABLETS BY MOUTH THREE TIMES DAILY, Disp: 405 tablet, Rfl: 0   CRANBERRY PO, Take 300 mg daily by mouth., Disp: , Rfl:    Cyanocobalamin (VITAMIN B-12 PO), Take 1,200 mcg daily by mouth. , Disp: , Rfl:    denosumab  (PROLIA ) 60 MG/ML SOSY injection, Inject 60 mg into the skin every 6 (six) months., Disp: , Rfl:    docusate sodium   (COLACE) 100 MG capsule, Take 100 mg by mouth daily as needed for mild constipation., Disp: , Rfl:    EPINEPHrine  0.3 mg/0.3 mL IJ SOAJ injection, Inject 0.3 mg into the muscle once as needed for up to 1 dose (anaphylaxis)., Disp: 2 each, Rfl: 1   Ergocalciferol  (VITAMIN D2) 2000 units TABS, Take 2,000 Units daily  by mouth. , Disp: , Rfl:    furosemide  (LASIX ) 20 MG tablet, Take 20 mg by mouth every Monday, Wednesday, and Friday., Disp: , Rfl:    KLOR-CON  M10 10 MEQ tablet, Take 10 mEq by mouth 3 (three) times a week., Disp: , Rfl:    metroNIDAZOLE (FLAGYL) 500 MG tablet, Take 1 tablet (500 mg total) by mouth 2 (two) times daily for 7 days., Disp: 14 tablet, Rfl: 0   omeprazole  (PRILOSEC ) 40 MG capsule, Take 40 mg by mouth continuous as needed (indigestion)., Disp: , Rfl:    potassium chloride  (KLOR-CON ) 10 MEQ tablet, Take by mouth., Disp: , Rfl:    propranolol  ER (INDERAL  LA) 80 MG 24 hr capsule, TAKE 1 CAPSULE(80 MG) BY MOUTH DAILY (Patient taking differently: Take 80 mg by mouth daily.), Disp: 90 capsule, Rfl: 3   saccharomyces boulardii (FLORASTOR) 250 MG capsule, Take 250 mg by mouth 3 (three) times a week., Disp: , Rfl:       Objective:   Vitals:   01/29/24 1056  BP: (!) 142/68  Pulse: 61  SpO2: 98%  Weight: 159 lb 12.8 oz (72.5 kg)  Height: 5' 3.5 (1.613 m)    Estimated body mass index is 27.86 kg/m as calculated from the following:   Height as of this encounter: 5' 3.5 (1.613 m).   Weight as of this encounter: 159 lb 12.8 oz (72.5 kg).  @WEIGHTCHANGE @  [employer]   01/29/24 1056  Weight: 159 lb 12.8 oz (72.5 kg)     Physical Exam   General: No distress. obese O2 at rest: no Cane present: no Sitting in wheel chair: no BUT HAS WALKER Frail: no Obese: no Neuro: Alert and Oriented x 3. GCS 15. Speech normal Psych: Pleasant Resp:  Barrel Chest - no.  Wheeze - no, Crackles - no, No overt respiratory distress CVS: Normal heart sounds. Murmurs - no Ext: Stigmata  of Connective Tissue Disease - o HEENT: Normal upper airway. PEERL +. No post nasal drip        Assessment:       ICD-10-CM   1. History of pulmonary embolism  Z86.711     2. Orthostatic dizziness  R42     3. Grade I diastolic dysfunction  I51.89     4. Elevated brain natriuretic peptide (BNP) level  R79.89          Plan:     Patient Instructions     ICD-10-CM   1. History of pulmonary embolism  Z86.711     2. Orthostatic dizziness  R42     3. DOE (dyspnea on exertion)  R06.09       #Pulmonary embolism  - You have completed 1 year of high dose/full dose Eliquis  through May 2024 - You have completed the second year on low-dose Eliquis  as of March 2024 - D-dimer is normal and no right ventricular strain on echocardiogram spring 2025 but VQ scan suggests mild chronic PE - Overall tolerating low-dose Eliquis  well  Plan  - Shared decision making to continue low-dose Eliquis  for another 1 year and then recheck D-dimer and stop if D-dimer is normal [this is on account of slight chronic PE changes]   #Orthostatic dizziness  -Your blood pressure dropoed significantly at least 40 points in March 2025 when you stand up and your heart rate goes up at least 10 points -I think this is playing a role in your dizziness - On 01/29/2024 -noted that you still have not taken up  the issue with your primary care physician but he also narrated that Dr. Ladona was the one who prescribed you your propranolol   Plan  - Sending a message to Dr. Ladona about this  #Elevated BNP and grade 1 diastolic dysfunction on echo  Plan - Sending a message to Dr. Ladona about this  [place] - 60-month follow-up with nurse practitioner -     FOLLOWUP Return in about 6 months (around 07/30/2024) for with any of the APPS, Face to Face Visit.    SIGNATURE    Dr. Dorethia Cave, M.D., F.C.C.P,  Pulmonary and Critical Care Medicine Staff Physician, [facility] Center Director -  Interstitial Lung Disease  Program  Pulmonary Fibrosis [facility] Network at [facility] [place], [place], [address]  Pager: [phone], If no answer or between  15:00h - 7:00h: call [phone] Telephone: [phone]  6:08 PM 01/29/2024

## 2024-01-29 NOTE — Patient Instructions (Addendum)
 ICD-10-CM   1. History of pulmonary embolism  Z86.711     2. Orthostatic dizziness  R42     3. DOE (dyspnea on exertion)  R06.09       #Pulmonary embolism  - You have completed 1 year of high dose/full dose Eliquis  through May 2024 - You have completed the second year on low-dose Eliquis  as of March 2024 - D-dimer is normal and no right ventricular strain on echocardiogram spring 2025 but VQ scan suggests mild chronic PE - Overall tolerating low-dose Eliquis  well  Plan  - Shared decision making to continue low-dose Eliquis  for another 1 year and then recheck D-dimer and stop if D-dimer is normal [this is on account of slight chronic PE changes]   #Orthostatic dizziness  -Your blood pressure dropoed significantly at least 40 points in March 2025 when you stand up and your heart rate goes up at least 10 points -I think this is playing a role in your dizziness - On 01/29/2024 -noted that you still have not taken up the issue with your primary care physician but he also narrated that Dr. Ladona was the one who prescribed you your propranolol   Plan  - Sending a message to Dr. Ladona about this  #Elevated BNP and grade 1 diastolic dysfunction on echo  Plan - Sending a message to Dr. Ladona about this  [place] - 2-month follow-up with nurse practitioner -

## 2024-01-29 NOTE — Telephone Encounter (Signed)
 Daley Mooradian Sari Heinz  When I saw the patient in March 2025 for follow-up pulmonary embolism she had orthostatic dizziness dropping systolic blood pressure 40 points.  She is on propranolol .  I told her to follow-up with primary care doctor.  Shayne Anes, MD but she did not.  Today she comes saying that she still has orthostatic dizziness [did not recheck today] and she states that you orginally prescribe inderal   She has gr1 ddx and some elevated BNP as well  Are you able to address? Get one of your apps to see?  THanks    SIGNATURE    Dr. Dorethia Cave, M.D., F.C.C.P,  Pulmonary and Critical Care Medicine Staff Physician, [facility] Center Director - Interstitial Lung Disease  Program  Pulmonary Fibrosis [facility] Network at [facility] [place], [place], [address]   Pager: [phone], If no answer  -> Check AMION or Try [phone] Telephone (clinical office): [phone] Telephone (research): [phone]  11:29 AM 01/29/2024

## 2024-02-02 MED ORDER — PROPRANOLOL HCL 10 MG PO TABS: ORAL_TABLET | ORAL | 0 refills | Status: DC

## 2024-02-02 NOTE — Telephone Encounter (Signed)
 Patient notified.  She will follow up with Dr Shayne or Dr Ladona if orthostatic hypotension does not improve.  Prescription sent to [facility] on [address]

## 2024-02-02 NOTE — Telephone Encounter (Signed)
 Left message to call office

## 2024-02-09 DIAGNOSIS — Z1231 Encounter for screening mammogram for malignant neoplasm of breast: Secondary | ICD-10-CM | POA: Diagnosis not present

## 2024-02-12 ENCOUNTER — Other Ambulatory Visit: Payer: Self-pay | Admitting: Neurology

## 2024-02-12 DIAGNOSIS — G20A1 Parkinson's disease without dyskinesia, without mention of fluctuations: Secondary | ICD-10-CM

## 2024-02-16 NOTE — Progress Notes (Unsigned)
 Assessment/Plan:   1.  Parkinsons Disease  -Continue carbidopa /levodopa  25/100, 1.5 tablets 3 times per day  -continue carbidopa /levodopa  50/200 CR at bed for tremor at nighttime  -We discussed that it used to be thought that levodopa  would increase risk of melanoma but now it is believed that Parkinsons itself likely increases risk of melanoma. she is to get regular skin checks.  -contemplating a move to [place]    2.  Severe spinal canal stenosis at L3-L4  -Has seen Dr. Unice previously and also seen Dr. Carollee now.  She has seen Dr. Darlis and likely needs to call him to see if injection in back will help the foot.  Hasn't had one in long time  3.  History of expressive aphasia, likely representing complicated migraine   -On Lipitor.  Aspirin  was discontinued when she was placed on apixaban  for a pulmonary embolus.  Extensive negative work-up.  4.  Sialorrhea  -This is commonly associated with PD.  We talked about treatments.  The patient is not a candidate for oral anticholinergic therapy because of increased risk of confusion and falls.  We discussed Botox (type A and B) and 1% atropine drops.  We discusssed that candy like lemon drops can help by stimulating mm of the oropharynx to induce swallowing.  She is not interested right now  5.    Pulmonary embolus  -On Eliquis .  6.  Orthostatic hypotension  -Was on propranolol  at the time and was weaned off of that last month by cardiology Subjective:   Linda Lee was seen today in follow up for Parkinsons disease.  My previous records were reviewed prior to todays visit as well as outside records available to me.  She has not had falls since last visit.  She remains on levodopa .  She has been following with pulmonary.  When they saw her in March, she had significant orthostasis and they felt it was from the propranolol  and they sent a message to her cardiologist, Dr. Ladona.  Dr. Ladona just weaned her off of that medication  last month.  Current prescribed movement disorder medications: Carbidopa /levodopa  25/100, 1.5 tablets 3 times per day carbidopa /levodopa  50/200 cr q hs    ALLERGIES:   Allergies  Allergen Reactions   Ciprofloxacin  Other (See Comments)    Can not take due to hx of Cdiff per patient   Wasp Venom Other (See Comments)    dizziness   Wasp Venom Protein     Other Reaction(s): Not available   Fluconazole Rash    CURRENT MEDICATIONS:  Outpatient Encounter Medications as of 02/17/2024  Medication Sig   apixaban  (ELIQUIS ) 2.5 MG TABS tablet Take 1 tablet (2.5 mg total) by mouth 2 (two) times daily.   atorvastatin  (LIPITOR) 20 MG tablet Take 1 tablet (20 mg total) by mouth daily after supper.   carbidopa -levodopa  (SINEMET  CR) 50-200 MG tablet TAKE 1 TABLET BY MOUTH AT BEDTIME   carbidopa -levodopa  (SINEMET  IR) 25-100 MG tablet TAKE 1 AND 1/2 TABLETS BY MOUTH THREE TIMES DAILY   CRANBERRY PO Take 300 mg daily by mouth.   Cyanocobalamin (VITAMIN B-12 PO) Take 1,200 mcg daily by mouth.    denosumab  (PROLIA ) 60 MG/ML SOSY injection Inject 60 mg into the skin every 6 (six) months.   docusate sodium  (COLACE) 100 MG capsule Take 100 mg by mouth daily as needed for mild constipation.   EPINEPHrine  0.3 mg/0.3 mL IJ SOAJ injection Inject 0.3 mg into the muscle once as needed for up  to 1 dose (anaphylaxis).   Ergocalciferol  (VITAMIN D2) 2000 units TABS Take 2,000 Units daily by mouth.    furosemide  (LASIX ) 20 MG tablet Take 20 mg by mouth every Monday, Wednesday, and Friday.   KLOR-CON  M10 10 MEQ tablet Take 10 mEq by mouth 3 (three) times a week.   omeprazole  (PRILOSEC ) 40 MG capsule Take 40 mg by mouth continuous as needed (indigestion).   potassium chloride  (KLOR-CON ) 10 MEQ tablet Take by mouth.   propranolol  (INDERAL ) 10 MG tablet Take one tablet by mouth 3 times a day for 1 week followed by one tablet twice daily for 3 days and then stop   saccharomyces boulardii (FLORASTOR) 250 MG capsule Take  250 mg by mouth 3 (three) times a week.   No facility-administered encounter medications on file as of 02/17/2024.    Objective:   PHYSICAL EXAMINATION:    VITALS:   There were no vitals filed for this visit.   GEN:  The patient appears stated age and is in NAD. HEENT:  Normocephalic, atraumatic.  The mucous membranes are moist. The superficial temporal arteries are without ropiness or tenderness. Cv:  rrr Lungs:  ctab Neck:  no bruits  Neurological examination:  Orientation: The patient is alert and oriented x3. Cranial nerves: There is good facial symmetry without significant facial hypomimia. The speech is fluent and clear. Soft palate rises symmetrically and there is no tongue deviation. Hearing is intact to conversational tone. Sensation: Sensation is intact to light touch throughout.  No extinction with double simultaneous stimulation. Motor: Strength is 5/5 in the upper and lower extremities.  There is no pronator drift.  Movement examination: Tone: There is nl tone in the UE/LE Abnormal movements: there is tremor in the RLE/RUE, mild and stable Coordination:  There is no decremation today, with any form of RAMS, including alternating supination and pronation of the forearm, hand opening and closing, finger taps, heel taps and toe taps. Gait and Station: The patient pushes off.  She is careful with ambulation and is a bit unsteady.  She has her cane  I have reviewed and interpreted the following labs independently    Chemistry      Component Value Date/Time   NA 141 03/06/2023 1141   K 4.3 03/06/2023 1141   CL 106 03/06/2023 1141   CO2 26 03/06/2023 1141   BUN 23 03/06/2023 1141   CREATININE 0.88 03/06/2023 1141      Component Value Date/Time   CALCIUM  8.3 (L) 03/06/2023 1141   ALKPHOS 100 12/20/2022 1355   AST 10 12/20/2022 1355   ALT 3 12/20/2022 1355   BILITOT 0.7 12/20/2022 1355       Lab Results  Component Value Date   WBC 3.5 (L) 03/06/2023   HGB  11.7 (L) 03/06/2023   HCT 35.9 (L) 03/06/2023   MCV 95.7 03/06/2023   PLT 126 (L) 03/06/2023   Total time spent on today's visit was *** minutes, including both face-to-face time and nonface-to-face time.  Time included that spent on review of records (prior notes available to me/labs/imaging if pertinent), discussing treatment and goals, answering patient's questions and coordinating care.   Cc:  Shayne Anes, MD

## 2024-02-17 ENCOUNTER — Ambulatory Visit: Payer: Medicare PPO | Admitting: Neurology

## 2024-02-17 VITALS — Ht 65.0 in | Wt 155.0 lb

## 2024-02-17 DIAGNOSIS — G903 Multi-system degeneration of the autonomic nervous system: Secondary | ICD-10-CM | POA: Diagnosis not present

## 2024-02-17 DIAGNOSIS — G20A1 Parkinson's disease without dyskinesia, without mention of fluctuations: Secondary | ICD-10-CM

## 2024-02-17 MED ORDER — MIDODRINE HCL 2.5 MG PO TABS: 2.5000 mg | ORAL_TABLET | Freq: Three times a day (TID) | ORAL | 1 refills | Status: DC

## 2024-02-17 NOTE — Patient Instructions (Addendum)
 [facility] Elena Larry, MD [address] [place], [place] [address]  Start midodrine  2.5 mg three times per day with meals

## 2024-02-23 ENCOUNTER — Ambulatory Visit: Admitting: Podiatry

## 2024-02-23 DIAGNOSIS — L600 Ingrowing nail: Secondary | ICD-10-CM

## 2024-02-23 MED ORDER — SILVER SULFADIAZINE 1 % EX CREA: 1.0000 | TOPICAL_CREAM | Freq: Every day | CUTANEOUS | 0 refills | Status: DC

## 2024-02-23 NOTE — Progress Notes (Signed)
       Subjective:  Patient ID: Linda Lee, female    DOB: 1935/08/23,  MRN: [id number]  Chief Complaint  Patient presents with   PNA Recheck    Left 1st, lateral border. Epsom soaks 1x daily. Reports some redness around the nail but no pain or swelling. Denies drainage. She is taking Eliquis  and reports some excess bleding for 3 days after th elast appointment.     Linda Lee presents to clinic today for f/u of PNA to the right hallux medial nail border.  She has been using antibiotic ointment at home.  She still notes some peeling and improving redness to the periungual area.  Has minimal to no pain.  PCP is Shayne Anes, MD.  Allergies  Allergen Reactions   Ciprofloxacin  Other (See Comments)    Can not take due to hx of Cdiff per patient   Wasp Venom Other (See Comments)    dizziness   Wasp Venom Protein     Other Reaction(s): Not available   Fluconazole Rash    Objective:  Vascular Examination: Capillary refill time is 3-5 seconds to toes bilateral. Palpable pedal pulses b/l LE. Digital hair present b/l. No pedal edema b/l. Skin temperature gradient WNL b/l. No varicosities b/l. No cyanosis or clubbing noted b/l.   Dermatological Examination: Upon inspection of the PNA site, there is still mild maceration along the affected nail margin.  No purulence, no necrosis, no malodor present.  Minimal to no erythema present.  Minimal peeling skin present.  Eschar not yet formed along nail margin.  Minimal to no pain on palpation of area-mostly at the proximal nail fold..   Assessment/Plan: 1. Ingrown toenail     Meds ordered this encounter  Medications   silver  sulfADIAZINE  (SILVADENE ) 1 % cream    Sig: Apply 1 Application topically daily.    Dispense:  50 g    Refill:  0   Will send in prescription for Silvadene  cream to be applied to the nail margin once daily followed by a Band-Aid.  She can remove the Band-Aid and leave it open to the air when she is at home  in the evening or overnight.  Once the dry eschar has formed she can stop all treatment.  Follow-up as needed   [name], DPM, FACFAS [facility]     [address] [place], [place] [address]                Office [phone]  Fax [phone]

## 2024-03-16 ENCOUNTER — Other Ambulatory Visit: Payer: Self-pay | Admitting: Cardiology

## 2024-03-17 DIAGNOSIS — L820 Inflamed seborrheic keratosis: Secondary | ICD-10-CM | POA: Diagnosis not present

## 2024-03-17 DIAGNOSIS — Z1283 Encounter for screening for malignant neoplasm of skin: Secondary | ICD-10-CM | POA: Diagnosis not present

## 2024-03-17 DIAGNOSIS — D225 Melanocytic nevi of trunk: Secondary | ICD-10-CM | POA: Diagnosis not present

## 2024-03-17 DIAGNOSIS — L218 Other seborrheic dermatitis: Secondary | ICD-10-CM | POA: Diagnosis not present

## 2024-03-23 NOTE — Progress Notes (Unsigned)
 Assessment/Plan:   1.  Parkinsons Disease  -Continue carbidopa /levodopa  25/100, 1.5 tablets 3 times per day  -continue carbidopa /levodopa  50/200 CR at bed for tremor at nighttime  -We discussed that it used to be thought that levodopa  would increase risk of melanoma but now it is believed that Parkinsons itself likely increases risk of melanoma. she is to get regular skin checks.  - Still contemplating a move to [facility]  2.  Severe spinal canal stenosis at L3-L4  -Has seen Dr. Unice previously and also seen Dr. Carollee.  She asked about who to see now that he has gone, and I told her just to call their office.  She has seen Dr. Darlis and likely needs to call him to see if injection in back will help the foot.  Hasn't had one in long time  3.  History of expressive aphasia, likely representing complicated migraine   -On Lipitor.  Aspirin  was discontinued when she was placed on apixaban  for a pulmonary embolus.  Extensive negative work-up.  4.  Sialorrhea  -This is commonly associated with PD.  We talked about treatments.  The patient is not a candidate for oral anticholinergic therapy because of increased risk of confusion and falls.  We discussed Botox (type A and B) and 1% atropine drops.  We discusssed that candy like lemon drops can help by stimulating mm of the oropharynx to induce swallowing.  She is not interested right now  5.    Pulmonary embolus  -On Eliquis .  6.  Orthostatic hypotension  -not orthostatic today and she was prior to initiation of low dose midodrine   -continue midodrine  2.5 mg tid with meals.    -pt does, however, still have a feeling that she needs to sit down and possibly feels near syncopal.  Told her needs f/u with cardiology if continues since BP's are improved. Subjective:   Linda Lee was seen today in follow up for Parkinsons disease.  My previous records were reviewed prior to todays visit as well as outside records available to me.  I  saw her last month, at which point we started her on low-dose midodrine , 2.5 mg 3 times per day.  She brings in some BP's and there are none that are low.  She has noted more swelling but she held the lasix  thinking it would mess up her BP and create lower blood pressures.  She initially had some headaches but that is gone.  She still has the feeling like she needs to sit down - not dizzy but possibly near syncopal  Current prescribed movement disorder medications: Carbidopa /levodopa  25/100, 1.5 tablets 3 times per day carbidopa /levodopa  50/200 cr q hs    ALLERGIES:   Allergies  Allergen Reactions   Ciprofloxacin  Other (See Comments)    Can not take due to hx of Cdiff per patient   Wasp Venom Other (See Comments)    dizziness   Wasp Venom Protein     Other Reaction(s): Not available   Fluconazole Rash    CURRENT MEDICATIONS:  Outpatient Encounter Medications as of 03/25/2024  Medication Sig   apixaban  (ELIQUIS ) 2.5 MG TABS tablet Take 1 tablet (2.5 mg total) by mouth 2 (two) times daily.   atorvastatin  (LIPITOR) 20 MG tablet Take 1 tablet (20 mg total) by mouth daily after supper.   carbidopa -levodopa  (SINEMET  CR) 50-200 MG tablet TAKE 1 TABLET BY MOUTH AT BEDTIME   carbidopa -levodopa  (SINEMET  IR) 25-100 MG tablet TAKE 1 AND 1/2 TABLETS BY MOUTH  THREE TIMES DAILY   CRANBERRY PO Take 300 mg daily by mouth.   Cyanocobalamin (VITAMIN B-12 PO) Take 1,200 mcg daily by mouth.    denosumab  (PROLIA ) 60 MG/ML SOSY injection Inject 60 mg into the skin every 6 (six) months.   docusate sodium  (COLACE) 100 MG capsule Take 100 mg by mouth daily as needed for mild constipation.   EPINEPHrine  0.3 mg/0.3 mL IJ SOAJ injection Inject 0.3 mg into the muscle once as needed for up to 1 dose (anaphylaxis).   Ergocalciferol  (VITAMIN D2) 2000 units TABS Take 2,000 Units daily by mouth.    furosemide  (LASIX ) 20 MG tablet Take 20 mg by mouth every Monday, Wednesday, and Friday.   KLOR-CON  M10 10 MEQ tablet  Take 10 mEq by mouth 3 (three) times a week.   midodrine  (PROAMATINE ) 2.5 MG tablet Take 1 tablet (2.5 mg total) by mouth 3 (three) times daily with meals.   omeprazole  (PRILOSEC ) 40 MG capsule Take 40 mg by mouth continuous as needed (indigestion).   potassium chloride  (KLOR-CON ) 10 MEQ tablet Take by mouth.   saccharomyces boulardii (FLORASTOR) 250 MG capsule Take 250 mg by mouth 3 (three) times a week.   silver  sulfADIAZINE  (SILVADENE ) 1 % cream Apply 1 Application topically daily.   No facility-administered encounter medications on file as of 03/25/2024.    Objective:   PHYSICAL EXAMINATION:    VITALS:   Vitals:   03/25/24 1058 03/25/24 1114 03/25/24 1115  SpO2: 96% 96% 97%  Weight: 160 lb 12.8 oz (72.9 kg) 160 lb 12.8 oz (72.9 kg)     Orthostatic VS for the past 72 hrs (Last 3 readings):  Orthostatic BP Patient Position BP Location Orthostatic Pulse  03/25/24 1115 138/76 Standing Left Arm 91  03/25/24 1114 146/82 Sitting Left Arm 70  03/25/24 1058 144/80 Supine Left Arm 71      GEN:  The patient appears stated age and is in NAD. HEENT:  Normocephalic, atraumatic.  The mucous membranes are moist. The superficial temporal arteries are without ropiness or tenderness. Cv:  rrr Lungs:  ctab Neck:  no bruits  Neurological examination:  Orientation: The patient is alert and oriented x3. Cranial nerves: There is good facial symmetry without significant facial hypomimia. The speech is fluent and clear. Soft palate rises symmetrically and there is no tongue deviation. Hearing is intact to conversational tone. Sensation: Sensation is intact to light touch throughout.  No extinction with double simultaneous stimulation. Motor: Strength is 5/5 in the upper and lower extremities.  There is no pronator drift.  Movement examination: Tone: There is nl tone in the UE/LE Abnormal movements: there is tremor in the RLE/RUE, mild and stable Coordination:  There is no decremation today,  with any form of RAMS, including alternating supination and pronation of the forearm, hand opening and closing, finger taps, heel taps and toe taps. Gait and Station: The patient pushes off.  She ambulates with her walker.  She actually picks up the walker part of the time.  She ambulates well today.  No dizziness   I have reviewed and interpreted the following labs independently    Chemistry      Component Value Date/Time   NA 141 03/06/2023 1141   K 4.3 03/06/2023 1141   CL 106 03/06/2023 1141   CO2 26 03/06/2023 1141   BUN 23 03/06/2023 1141   CREATININE 0.88 03/06/2023 1141      Component Value Date/Time   CALCIUM  8.3 (L) 03/06/2023 1141   ALKPHOS  100 12/20/2022 1355   AST 10 12/20/2022 1355   ALT 3 12/20/2022 1355   BILITOT 0.7 12/20/2022 1355       Lab Results  Component Value Date   WBC 3.5 (L) 03/06/2023   HGB 11.7 (L) 03/06/2023   HCT 35.9 (L) 03/06/2023   MCV 95.7 03/06/2023   PLT 126 (L) 03/06/2023   Total time spent on today's visit was 24 minutes, including both face-to-face time and nonface-to-face time.  Time included that spent on review of records (prior notes available to me/labs/imaging if pertinent), discussing treatment and goals, answering patient's questions and coordinating care.   Cc:  Shayne Anes, MD

## 2024-03-25 ENCOUNTER — Ambulatory Visit

## 2024-03-25 ENCOUNTER — Ambulatory Visit: Admitting: Neurology

## 2024-03-25 ENCOUNTER — Encounter: Payer: Self-pay | Admitting: Neurology

## 2024-03-25 VITALS — Wt 160.8 lb

## 2024-03-25 DIAGNOSIS — G20A1 Parkinson's disease without dyskinesia, without mention of fluctuations: Secondary | ICD-10-CM

## 2024-03-25 DIAGNOSIS — G903 Multi-system degeneration of the autonomic nervous system: Secondary | ICD-10-CM

## 2024-03-25 NOTE — Patient Instructions (Signed)
 Call Dr. Godfrey office if you continue to feel like you may pass out.  The physicians and staff at [facility] are committed to providing excellent care. You may receive a survey requesting feedback about your experience at our office. We strive to receive very good responses to the survey questions. If you feel that your experience would prevent you from giving the office a very good  response, please contact our office to try to remedy the situation. We may be reached at [phone]. Thank you for taking the time out of your busy day to complete the survey.

## 2024-04-06 ENCOUNTER — Other Ambulatory Visit: Payer: Self-pay

## 2024-04-06 ENCOUNTER — Emergency Department (HOSPITAL_BASED_OUTPATIENT_CLINIC_OR_DEPARTMENT_OTHER)
Admission: EM | Admit: 2024-04-06 | Discharge: 2024-04-06 | Disposition: A | Discharge status: Home / Self Care | Attending: Emergency Medicine | Admitting: Emergency Medicine

## 2024-04-06 ENCOUNTER — Emergency Department (HOSPITAL_BASED_OUTPATIENT_CLINIC_OR_DEPARTMENT_OTHER)

## 2024-04-06 ENCOUNTER — Encounter (HOSPITAL_BASED_OUTPATIENT_CLINIC_OR_DEPARTMENT_OTHER): Payer: Self-pay | Admitting: Emergency Medicine

## 2024-04-06 ENCOUNTER — Telehealth: Payer: Self-pay | Admitting: Cardiology

## 2024-04-06 DIAGNOSIS — I509 Heart failure, unspecified: Secondary | ICD-10-CM | POA: Insufficient documentation

## 2024-04-06 DIAGNOSIS — R600 Localized edema: Secondary | ICD-10-CM | POA: Diagnosis not present

## 2024-04-06 DIAGNOSIS — K7689 Other specified diseases of liver: Secondary | ICD-10-CM | POA: Diagnosis not present

## 2024-04-06 DIAGNOSIS — R000 Tachycardia, unspecified: Secondary | ICD-10-CM | POA: Insufficient documentation

## 2024-04-06 DIAGNOSIS — I7121 Aneurysm of the ascending aorta, without rupture: Secondary | ICD-10-CM | POA: Diagnosis not present

## 2024-04-06 DIAGNOSIS — Z7901 Long term (current) use of anticoagulants: Secondary | ICD-10-CM | POA: Insufficient documentation

## 2024-04-06 DIAGNOSIS — R0602 Shortness of breath: Secondary | ICD-10-CM | POA: Diagnosis not present

## 2024-04-06 DIAGNOSIS — I700 Atherosclerosis of aorta: Secondary | ICD-10-CM | POA: Diagnosis not present

## 2024-04-06 DIAGNOSIS — I517 Cardiomegaly: Secondary | ICD-10-CM | POA: Diagnosis not present

## 2024-04-06 DIAGNOSIS — I110 Hypertensive heart disease with heart failure: Secondary | ICD-10-CM | POA: Diagnosis not present

## 2024-04-06 LAB — CBC
HCT: 38.5 % (ref 36.0–46.0)
Hemoglobin: 12.5 g/dL (ref 12.0–15.0)
MCH: 31.3 pg (ref 26.0–34.0)
MCHC: 32.5 g/dL (ref 30.0–36.0)
MCV: 96.5 fL (ref 80.0–100.0)
Platelets: 178 K/uL (ref 150–400)
RBC: 3.99 MIL/uL (ref 3.87–5.11)
RDW: 12.6 % (ref 11.5–15.5)
WBC: 4.3 K/uL (ref 4.0–10.5)
nRBC: 0 % (ref 0.0–0.2)

## 2024-04-06 LAB — PRO BRAIN NATRIURETIC PEPTIDE: Pro Brain Natriuretic Peptide: 1030 pg/mL — ABNORMAL HIGH (ref <300.0)

## 2024-04-06 LAB — BASIC METABOLIC PANEL WITH GFR
Anion gap: 11 (ref 5–15)
BUN: 19 mg/dL (ref 8–23)
CO2: 24 mmol/L (ref 22–32)
Calcium: 8.7 mg/dL — ABNORMAL LOW (ref 8.9–10.3)
Chloride: 106 mmol/L (ref 98–111)
Creatinine, Ser: 1.01 mg/dL — ABNORMAL HIGH (ref 0.44–1.00)
GFR, Estimated: 54 mL/min — ABNORMAL LOW (ref >60)
Glucose, Bld: 93 mg/dL (ref 70–99)
Potassium: 4.2 mmol/L (ref 3.5–5.1)
Sodium: 142 mmol/L (ref 135–145)

## 2024-04-06 LAB — TROPONIN T, HIGH SENSITIVITY
Troponin T High Sensitivity: <15 ng/L (ref 0–19)
Troponin T High Sensitivity: <15 ng/L (ref 0–19)

## 2024-04-06 MED ORDER — IOHEXOL 350 MG/ML SOLN
75.0000 mL | Freq: Once | INTRAVENOUS | Status: AC | PRN
  Administered 2024-04-06: 75 mL via INTRAVENOUS

## 2024-04-06 MED ORDER — FUROSEMIDE 10 MG/ML IJ SOLN
60.0000 mg | Freq: Once | INTRAMUSCULAR | Status: AC
Start: 2024-04-06 — End: 2024-04-06
  Administered 2024-04-06: 60 mg via INTRAVENOUS
  Filled 2024-04-06: qty 6

## 2024-04-06 NOTE — Discharge Instructions (Signed)
 Please follow-up with your cardiologist as previously scheduled.  Return to the ER if you have any concern.

## 2024-04-06 NOTE — ED Notes (Signed)
 Pt had two instances of going in and out of ST. Went from 78 NSR to 120-130 NST. Printed out multiple ECG strips and handed to Edp.

## 2024-04-06 NOTE — ED Triage Notes (Signed)
 Pt reports being notified by smart watch of elevated HR today around 1430.   Reports ShOB, intermittent chest pain with exertion, BLLE swelling left worse than right.

## 2024-04-06 NOTE — ED Notes (Signed)
ED Provider at bedside with US 

## 2024-04-06 NOTE — ED Provider Notes (Signed)
Dypsnea on exertion x several days.  Has came off her Lasix  due to problem with orthostatic hypotension in [place].  SHe's on Eliquis  for hx of PE.    Awaits chest CTA.  If negative, ambulate and check O2  Received signout from previous provider, please see his note for complete H&P.  88 year old female history of PE currently on Eliquis , also has history of CHF and hypertension.  Patient is here with complaints of elevated heart rate and having some shortness of breath upon exertion.  Previously she has stopped taking her Lasix  due to having orthostatic hypotension in June of this year.  Since then she noticed increasing swelling to her legs.   EKG Interpretation Date/Time:  Tuesday April 06 2024 16:32:40 EDT Ventricular Rate:  106 PR Interval:  148 QRS Duration:  73 QT Interval:  331 QTC Calculation: 440 R Axis:   11  Text Interpretation: Sinus tachycardia SINCE LAST TRACING HEART RATE HAS INCREASED Confirmed by Dreama Longs ([id number]) on 04/06/2024 8:10:51 PM       -Labs ordered, independently viewed and interpreted by me.  Labs remarkable for proBNP of 1030 however when age-adjusted and it was slightly normal.  Patient has negative delta troponin low suspicion for Mace.  Electrolyte panels are reassuring. -The patient was maintained on a cardiac monitor.  I personally viewed and interpreted the cardiac monitored which showed an underlying rhythm of: Sinus tachycardia -Imaging independently viewed and interpreted by me and I agree with radiologist's interpretation.  Result remarkable for chest CT angiogram showing no evidence of PE.  Patient does have stable dilatation of the ascending aorta measuring 4.5 cm.  I discussed this with the patient and she would need to have routine follow-up CTA for monitoring. -This patient presents to the ED for concern of tachycardia, this involves an extensive number of treatment options, and is a complaint that carries with it a high risk of complications  and morbidity.  The differential diagnosis includes dehydration, cardiac arrhythmia, PE, anemia, electrolyte imbalance, thyroid  disease -Co morbidities that complicate the patient evaluation includes PE -Treatment includes Lasix  -Reevaluation of the patient after these medicines showed that the patient improved -PCP office notes or outside notes reviewed -Escalation to admission/observation considered: patients feels much better, is comfortable with discharge, and will follow up with PCP -Prescription medication considered, patient comfortable with home med -Social Determinant of Health considered   Patient is aware of the ascending aortic dilatation.  She does have a cardiology appointment in a few days for close follow-up.  She is stable for discharge.  She felt better.  BP (!) 141/74   Pulse 79   Temp 98.1 F (36.7 C) (Oral)   Resp 15   Ht 5' 5 (1.651 m)   Wt 72.1 kg   SpO2 98%   BMI 26.46 kg/m   Results for orders placed or performed during the hospital encounter of 04/06/24  Basic metabolic panel   Collection Time: 04/06/24  4:31 PM  Result Value Ref Range   Sodium 142 135 - 145 mmol/L   Potassium 4.2 3.5 - 5.1 mmol/L   Chloride 106 98 - 111 mmol/L   CO2 24 22 - 32 mmol/L   Glucose, Bld 93 70 - 99 mg/dL   BUN 19 8 - 23 mg/dL   Creatinine, Ser 8.98 (H) 0.44 - 1.00 mg/dL   Calcium  8.7 (L) 8.9 - 10.3 mg/dL   GFR, Estimated 54 (L) >60 mL/min   Anion gap 11 5 - 15  CBC  Collection Time: 04/06/24  4:31 PM  Result Value Ref Range   WBC 4.3 4.0 - 10.5 K/uL   RBC 3.99 3.87 - 5.11 MIL/uL   Hemoglobin 12.5 12.0 - 15.0 g/dL   HCT 61.4 63.9 - 53.9 %   MCV 96.5 80.0 - 100.0 fL   MCH 31.3 26.0 - 34.0 pg   MCHC 32.5 30.0 - 36.0 g/dL   RDW 87.3 88.4 - 84.4 %   Platelets 178 150 - 400 K/uL   nRBC 0.0 0.0 - 0.2 %  Pro Brain natriuretic peptide   Collection Time: 04/06/24  4:31 PM  Result Value Ref Range   Pro Brain Natriuretic Peptide 1,030.0 (H) <300.0 pg/mL  Troponin T,  High Sensitivity   Collection Time: 04/06/24  4:31 PM  Result Value Ref Range   Troponin T High Sensitivity <15 0 - 19 ng/L  Troponin T, High Sensitivity   Collection Time: 04/06/24  6:45 PM  Result Value Ref Range   Troponin T High Sensitivity <15 0 - 19 ng/L   CT Angio Chest PE W and/or Wo Contrast Result Date: 04/06/2024 CLINICAL DATA:  Low to intermediate probability for pulmonary embolism. Elevated heart rate. EXAM: CT ANGIOGRAPHY CHEST WITH CONTRAST TECHNIQUE: Multidetector CT imaging of the chest was performed using the standard protocol during bolus administration of intravenous contrast. Multiplanar CT image reconstructions and MIPs were obtained to evaluate the vascular anatomy. RADIATION DOSE REDUCTION: This exam was performed according to the departmental dose-optimization program which includes automated exposure control, adjustment of the mA and/or kV according to patient size and/or use of iterative reconstruction technique. CONTRAST:  75mL OMNIPAQUE  IOHEXOL  350 MG/ML SOLN COMPARISON:  CT PE protocol 01/19/2022. FINDINGS: Cardiovascular: The ascending aorta is dilated measuring 4.5 cm, unchanged. There are atherosclerotic calcifications of the aorta. The heart is mildly enlarged. There is no pericardial effusion. There is adequate opacification of the pulmonary arteries to the segmental level. There is no evidence for pulmonary embolism. Mediastinum/Nodes: No enlarged mediastinal, hilar, or axillary lymph nodes. Thyroid  gland, trachea, and esophagus demonstrate no significant findings. There is a small hiatal hernia. Lungs/Pleura: There are atelectatic changes in the left lower lobe. The lungs are otherwise clear. There is no pleural effusion or pneumothorax. Upper Abdomen: Hepatic hypodensities appear unchanged from the prior study measuring up to 5.7 cm. These are favored as cysts. Musculoskeletal: No chest wall abnormality. No acute or significant osseous findings. Review of the MIP images  confirms the above findings. IMPRESSION: 1. No evidence for pulmonary embolism. 2. Stable dilatation of the ascending aorta measuring 4.5 cm. Ascending thoracic aortic aneurysm. Recommend semi-annual imaging followup by CTA or MRA and referral to cardiothoracic surgery if not already obtained. This recommendation follows 2010 ACCF/AHA/AATS/ACR/ASA/SCA/SCAI/SIR/STS/SVM Guidelines for the Diagnosis and Management of Patients With Thoracic Aortic Disease. Circulation. 2010; 121: Z733-z630. Aortic aneurysm NOS (ICD10-I71.9) 3. Stable hepatic cysts. 4. Aortic atherosclerosis. Aortic Atherosclerosis (ICD10-I70.0). Electronically Signed   By: Greig Pique M.D.   On: 04/06/2024 19:58   DG Chest 2 View Result Date: 04/06/2024 CLINICAL DATA:  Shortness of breath. EXAM: CHEST - 2 VIEW COMPARISON:  10/27/2023. FINDINGS: The heart size and mediastinal contours are within normal limits. No overt pulmonary edema, focal consolidation, pleural effusion, or pneumothorax. Diffuse osseous demineralization. No acute osseous abnormality. IMPRESSION: No acute cardiopulmonary findings. Electronically Signed   By: Harrietta Sherry M.D.   On: 04/06/2024 17:46         Nivia Colon, PA-C 04/06/24 2046    Dreama Longs, MD  04/07/24 1326  

## 2024-04-06 NOTE — Telephone Encounter (Signed)
 STAT if HR is under 50 or over 120  (normal HR is 60-100 beats per minute)  What is your heart rate? 99  Do you have a log of your heart rate readings (document readings)? Ranging 120  Do you have any other symptoms? A little short of breath  Patient stated she had an alert go off stating that her HR was high. Patient stated her heart rate is ranging around 120-95. Patient stated her heart rate was 99 while on the phone. Please advise.

## 2024-04-06 NOTE — Telephone Encounter (Signed)
 Left message to call back.

## 2024-04-06 NOTE — ED Provider Notes (Signed)
 [place] [facility]ENC[facility] DEPARTMENT AT [facility] [place] Provider Note   CSN: [id number] Arrival date & time: 04/06/24  1619     Patient presents with: Tachycardia   Linda Lee is a 88 y.o. female history of PE on Eliquis , hypertension, hyperlipidemia, CHF presents with complaints of dyspnea on exertion and tachycardia.  Patient reports that she has recently discontinued her Lasix  as they were having trouble to managing her blood pressure.  She does endorse lower extremity edema recently.  Denies significant chest pain.  States that she was just sitting earlier today when her watch told her her heart rate was 120.  Reports compliance with her Eliquis .  PE, now 2 years status post was reportedly unprovoked.   HPI  Past Medical History:  Diagnosis Date   Anginal pain (HCC)    Angio-edema    Arthritis    Atypical chest pain 07/06/2009   Qualifier: Diagnosis of  By: Verlin, MD, Christopher     GERD (gastroesophageal reflux disease)    Hyperlipemia    Migraines    Toxic effect of venom of bees, unintentional 06/03/2017   Past Surgical History:  Procedure Laterality Date   ADENOIDECTOMY     BACK SURGERY     HEMORRHOID SURGERY     RIGHT/LEFT HEART CATH AND CORONARY ANGIOGRAPHY N/A 06/17/2017   Procedure: RIGHT/LEFT HEART CATH AND CORONARY ANGIOGRAPHY;  Surgeon: Ladona Heinz, MD;  Location: [facility] INVASIVE CV LAB;  Service: Cardiovascular;  Laterality: N/A;   Thoracicc Aortic Derryl 07/2009     denies surgery for this   TONSILLECTOMY     TOTAL KNEE ARTHROPLASTY Right 09/25/2015   Procedure: TOTAL KNEE ARTHROPLASTY;  Surgeon: Dempsey Moan, MD;  Location: [facility] ORS;  Service: Orthopedics;  Laterality: Right;       Prior to Admission medications   Medication Sig Start Date End Date Taking? Authorizing Provider  apixaban  (ELIQUIS ) 2.5 MG TABS tablet Take 1 tablet (2.5 mg total) by mouth 2 (two) times daily. 01/22/23   Cobb, Comer GAILS, NP  atorvastatin  (LIPITOR) 20 MG tablet Take  1 tablet (20 mg total) by mouth daily after supper. 08/11/18   Mesner, Selinda, MD  carbidopa -levodopa  (SINEMET  CR) 50-200 MG tablet TAKE 1 TABLET BY MOUTH AT BEDTIME 02/13/24   Tat, Asberry RAMAN, DO  carbidopa -levodopa  (SINEMET  IR) 25-100 MG tablet TAKE 1 AND 1/2 TABLETS BY MOUTH THREE TIMES DAILY 12/16/23   Tat, Asberry RAMAN, DO  CRANBERRY PO Take 300 mg daily by mouth.    [provider]  Cyanocobalamin (VITAMIN B-12 PO) Take 1,200 mcg daily by mouth.     [provider]  denosumab  (PROLIA ) 60 MG/ML SOSY injection Inject 60 mg into the skin every 6 (six) months.    [provider]  docusate sodium  (COLACE) 100 MG capsule Take 100 mg by mouth daily as needed for mild constipation.    [provider]  EPINEPHrine  0.3 mg/0.3 mL IJ SOAJ injection Inject 0.3 mg into the muscle once as needed for up to 1 dose (anaphylaxis). 02/07/21   Cheryl Reusing, FNP  Ergocalciferol  (VITAMIN D2) 2000 units TABS Take 2,000 Units daily by mouth.     [provider]  furosemide  (LASIX ) 20 MG tablet Take 20 mg by mouth every Monday, Wednesday, and Friday.    [provider]  KLOR-CON  M10 10 MEQ tablet Take 10 mEq by mouth 3 (three) times a week. 05/11/19   [provider]  midodrine  (PROAMATINE ) 2.5 MG tablet Take 1 tablet (2.5 mg  total) by mouth 3 (three) times daily with meals. 02/17/24   Tat, Asberry RAMAN, DO  omeprazole  (PRILOSEC ) 40 MG capsule Take 40 mg by mouth continuous as needed (indigestion). 06/15/18   [provider]  potassium chloride  (KLOR-CON ) 10 MEQ tablet Take by mouth. 10/08/23   [provider]  saccharomyces boulardii (FLORASTOR) 250 MG capsule Take 250 mg by mouth 3 (three) times a week.    [provider]  silver  sulfADIAZINE  (SILVADENE ) 1 % cream Apply 1 Application topically daily. 02/23/24   McCaughan, Dia D, DPM    Allergies: Ciprofloxacin , Wasp venom, Wasp venom protein, and Fluconazole    Review of Systems   Respiratory:  Positive for shortness of breath.     Updated Vital Signs BP (!) 143/67   Pulse 73   Temp 98.1 F (36.7 C) (Oral)   Resp (!) 22   Ht 5' 5 (1.651 m)   Wt 72.1 kg   SpO2 97%   BMI 26.46 kg/m   Physical Exam Vitals and nursing note reviewed.  Constitutional:      General: She is not in acute distress.    Appearance: She is well-developed.  HENT:     Head: Normocephalic and atraumatic.  Eyes:     Conjunctiva/sclera: Conjunctivae normal.  Cardiovascular:     Rate and Rhythm: Normal rate and regular rhythm.     Heart sounds: No murmur heard. Pulmonary:     Effort: Pulmonary effort is normal. No respiratory distress.     Breath sounds: Normal breath sounds.  Abdominal:     Palpations: Abdomen is soft.     Tenderness: There is no abdominal tenderness.  Musculoskeletal:        General: Swelling present.     Cervical back: Neck supple.     Comments: 1+ bilateral pitting edema  Skin:    General: Skin is warm and dry.     Capillary Refill: Capillary refill takes less than 2 seconds.  Neurological:     Mental Status: She is alert.  Psychiatric:        Mood and Affect: Mood normal.     (all labs ordered are listed, but only abnormal results are displayed) Labs Reviewed  BASIC METABOLIC PANEL WITH GFR - Abnormal; Notable for the following components:      Result Value   Creatinine, Ser 1.01 (*)    Calcium  8.7 (*)    GFR, Estimated 54 (*)    All other components within normal limits  PRO BRAIN NATRIURETIC PEPTIDE - Abnormal; Notable for the following components:   Pro Brain Natriuretic Peptide 1,030.0 (*)    All other components within normal limits  CBC  TROPONIN T, HIGH SENSITIVITY  TROPONIN T, HIGH SENSITIVITY    EKG: None  Radiology: DG Chest 2 View Result Date: 04/06/2024 CLINICAL DATA:  Shortness of breath. EXAM: CHEST - 2 VIEW COMPARISON:  10/27/2023. FINDINGS: The heart size and mediastinal contours are within normal limits. No overt  pulmonary edema, focal consolidation, pleural effusion, or pneumothorax. Diffuse osseous demineralization. No acute osseous abnormality. IMPRESSION: No acute cardiopulmonary findings. Electronically Signed   By: Harrietta Sherry M.D.   On: 04/06/2024 17:46     Procedures   Medications Ordered in the ED  iohexol  (OMNIPAQUE ) 350 MG/ML injection 75 mL (75 mLs Intravenous Contrast Given 04/06/24 1826)  furosemide  (LASIX ) injection 60 mg (60 mg Intravenous Given 04/06/24 1839)    Clinical Course as of 04/06/24 1849  Tue Apr 06, 2024  1801 DG  Chest 2 View No acute findings [JT]  1801 CBC Unremarkable [JT]  1801 Basic metabolic panel(!) Mildly elevated creatinine, otherwise no acute findings [JT]  1801 Troponin T, High Sensitivity Within normal limits [JT]  1802 Pro Brain natriuretic peptide(!) Elevated to 1030.0, is age-appropriate. [JT]  1846 Will obtain CT PE study, given history with onset of new symptoms. [JT]    Clinical Course User Index [JT] Donnajean Lynwood DEL, PA-C                                 Medical Decision Making Amount and/or Complexity of Data Reviewed Labs: ordered. Radiology: ordered.   This patient presents to the ED with chief complaint(s) of  tachycardia .  The complaint involves an extensive differential diagnosis and also carries with it a high risk of complications and morbidity.   Pertinent past medical history as listed in HPI  The differential diagnosis includes  Differential at this time includes ACS, PE, pneumonia, CHF Additional history obtained: Records reviewed Care Everywhere/External Records  Disposition:   Signout given to [place], PA-C.  Please see his note for remainder the visit.  Disposition pending workup.  Social Determinants of Health:   none  This note was dictated with voice recognition software.  Despite best efforts at proofreading, errors may have occurred which can change the documentation meaning.       Final diagnoses:   Tachycardia    ED Discharge Orders     None          Donnajean Lynwood DEL DEVONNA 04/06/24 1849    Dreama Longs, MD 04/07/24 1326

## 2024-04-07 NOTE — Telephone Encounter (Signed)
 Called and spoke with patient. Patient states that yesterday her watch alarmed for heart rate over 120. She reports having SOB when moving around and intermittent chest pain yesterday. Reports that her PCP advised her to go to the emergency room. Patient went to emergency room to be evaluated and was discharged after testing. Patient states that today she is feeling a little better than yesterday. States she still has some SOB and intermittent chest pain but it is better than yesterday. States she is resting more. Patient states she has not recently been ill and is trying to drink more water and stay hydrated. Reports blood pressure of 119/83 and heart rate of 102 this morning. Reports blood pressure of 124/80 and heart rate of 107 while on current call. Patient is scheduled to follow up with Damien Braver on Friday (04/09/24) at 3:35 pm. Advised patient to keep that appointment. Advised patient that this note will be routed to [facility] and Dr. Ladona. Patient given ED precautions. Patient verbalized understanding and agreeable to plan.

## 2024-04-08 NOTE — Telephone Encounter (Signed)
 Noted

## 2024-04-08 NOTE — Progress Notes (Unsigned)
 Cardiology Office Note:    Date:  04/10/2024   ID:  Linda Lee, DOB 1936-02-24, MRN [id number]  PCP:  Shayne Anes, MD   [facility] Providers Cardiologist:  None { Click to update primary MD,subspecialty MD or APP then REFRESH:1}    Referring MD: Shayne Anes, MD   Chief complaint: Follow-up ED visit tachycardia  History of Present Illness:    Linda Lee is a 88 y.o. female with a hx of diastolic CHF, 4.5 cm ascending aortic aneurysm, coronary artery calcification seen on CT scan, NSVT, acute PE, GERD, Parkinson's, hyperlipidemia, DOE, presenting for follow-up from her ED visit for tachycardia on 04/06/2024.   Cardiac cath in 2018 following DOE and CP revealed normal coronary arteries, normal LVSF, normal RHC w/o PHTN. 08/2018: 2 brief episodes of NSVT on event monitor following a TIA, losartan switched to propanolol 80 mg daily. CT monitoring of AA decided on being performed every other year starting in 2022. She was admitted from 01/19/2022 to 01/22/2022 for unprovoked acute submassive pulmonary embolism and associated acute hypoxic respiratory failure. Was treated w/ Eliquis  at full strength for initial incident, now on on lifelong anticoagulation at 2.5 mg dosing BID per Dr. Ladona.  Echo on 11/13/2023 showed LVEF 60-65%, normal LV function, no RWMA, G1 DD, normal RV SF, normal RV size, moderately elevated PASP.  Normal mitral valve, moderate aortic regurgitation, aneurysm of the ascending aorta measuring 44 mm.  Presented to ED on 04/06/2024 after patient noticed her resting HR was 120 BPM. Reports she had recently d/c lasix  d/t hypotension, did endorse LE edema on ED arrival, reported compliance w/ eliquis  2 years s/p unprovoked PE. High sensitivity troponin negative, EKG showed sinus tachycardia at 106 bpm, BP 141/74, SpO2 98%, proBNP of 1030 however when age-adjusted was slightly normal, reassuring electrolyte panels, CT angio chest showed no evidence of PE, confirmed  stable dilation of ascending aortic aneurysm measuring 4.5 cm.  60 mg of furosemide  was given, on reevaluation patient improved following medication, patient was discharged with instructions for outpatient follow-up.  [name] is a very pleasant woman, arrived to the office today with concerns over new symptoms developing early June this year.  Reports she began having episodes of lightheadedness, near syncope, chest tightness that occasionally radiates through to her back, headache, and nausea that only occurred with positional changes and resolved with sitting.  Her propranolol  was stopped in July, and she was started on a low-dose of midodrine  by her neurologist who is managing her Parkinson's.  They also stopped her Lasix  in July, one month prior to her arrival in the ED where she was fluid overloaded and treated with Lasix .  Her symptoms have mildly improved while on the midodrine .  Denies shortness of breath, palpitations, dizziness, syncope, dark/tarry/bloody stools.  Does report mild lower extremity edema at baseline, worse when off the Lasix . Reports she tries to stay hydrated but isn't eating as much as she should.  Past Medical History:  Diagnosis Date   Anginal pain (HCC)    Angio-edema    Arthritis    Atypical chest pain 07/06/2009   Qualifier: Diagnosis of  By: Verlin, MD, Christopher     GERD (gastroesophageal reflux disease)    Hyperlipemia    Migraines    Toxic effect of venom of bees, unintentional 06/03/2017    Past Surgical History:  Procedure Laterality Date   ADENOIDECTOMY     BACK SURGERY     HEMORRHOID SURGERY     RIGHT/LEFT HEART  CATH AND CORONARY ANGIOGRAPHY N/A 06/17/2017   Procedure: RIGHT/LEFT HEART CATH AND CORONARY ANGIOGRAPHY;  Surgeon: Ladona Heinz, MD;  Location: [facility];  Service: Cardiovascular;  Laterality: N/A;   Thoracicc Aortic Derryl 07/2009     denies surgery for this   TONSILLECTOMY     TOTAL KNEE ARTHROPLASTY Right 09/25/2015   Procedure:  TOTAL KNEE ARTHROPLASTY;  Surgeon: Dempsey Moan, MD;  Location: [facility];  Service: Orthopedics;  Laterality: Right;    Current Medications: Current Meds  Medication Sig   apixaban  (ELIQUIS ) 2.5 MG TABS tablet Take 1 tablet (2.5 mg total) by mouth 2 (two) times daily.   atorvastatin  (LIPITOR) 20 MG tablet Take 1 tablet (20 mg total) by mouth daily after supper.   carbidopa -levodopa  (SINEMET  CR) 50-200 MG tablet TAKE 1 TABLET BY MOUTH AT BEDTIME   carbidopa -levodopa  (SINEMET  IR) 25-100 MG tablet TAKE 1 AND 1/2 TABLETS BY MOUTH THREE TIMES DAILY   clobetasol (TEMOVATE) 0.05 % external solution as needed (SKIN IRRITATION).   CRANBERRY PO Take 300 mg daily by mouth.   Cyanocobalamin (VITAMIN B-12 PO) Take 1,200 mcg daily by mouth.    denosumab  (PROLIA ) 60 MG/ML SOSY injection Inject 60 mg into the skin every 6 (six) months.   docusate sodium  (COLACE) 100 MG capsule Take 100 mg by mouth daily as needed for mild constipation.   EPINEPHrine  0.3 mg/0.3 mL IJ SOAJ injection Inject 0.3 mg into the muscle once as needed for up to 1 dose (anaphylaxis).   Ergocalciferol  (VITAMIN D2) 2000 units TABS Take 2,000 Units daily by mouth.    furosemide  (LASIX ) 20 MG tablet Take 20 mg by mouth every Monday, Wednesday, and Friday. (Patient taking differently: Take 20 mg by mouth daily as needed for fluid or edema. daily as needed only for weight gain on 3lb overnight or 5 lb in 1 week. Hold if systolic BP (top number)  is less than 110)   KLOR-CON  M10 10 MEQ tablet Take 10 mEq by mouth 3 (three) times a week.   midodrine  (PROAMATINE ) 2.5 MG tablet Take 1 tablet (2.5 mg total) by mouth 3 (three) times daily with meals.   omeprazole  (PRILOSEC ) 40 MG capsule Take 40 mg by mouth continuous as needed (indigestion).   potassium chloride  (KLOR-CON ) 10 MEQ tablet Take by mouth.   saccharomyces boulardii (FLORASTOR) 250 MG capsule Take 250 mg by mouth 3 (three) times a week.   silver  sulfADIAZINE  (SILVADENE ) 1 % cream Apply 1  Application topically daily.     Allergies:   Ciprofloxacin , Wasp venom, Wasp venom protein, and Fluconazole   Social History   Socioeconomic History   Marital status: Married    Spouse name: Not on file   Number of children: 3   Years of education: Not on file   Highest education level: Master's degree (e.g., MA, MS, MEng, MEd, MSW, MBA)  Occupational History   Occupation: retired    Comment: Runner, broadcasting/film/video  Tobacco Use   Smoking status: Former    Current packs/day: 0.00    Average packs/day: 0.3 packs/day for 5.0 years (1.3 ttl pk-yrs)    Types: Cigarettes    Start date: 08/14/1958    Quit date: 08/15/1963    Years since quitting: 60.6   Smokeless tobacco: Never  Vaping Use   Vaping status: Never Used  Substance and Sexual Activity   Alcohol  use: Yes    Alcohol /week: 1.0 standard drink of alcohol     Types: 1 Glasses of wine per week  Comment: q hs   Drug use: No   Sexual activity: Yes  Other Topics Concern   Not on file  Social History Narrative   Pt lives at home with spouse   Right handed   2 children   Social Drivers of Corporate investment banker Strain: Not on file  Food Insecurity: Not on file  Transportation Needs: Not on file  Physical Activity: Not on file  Stress: Not on file  Social Connections: Not on file     Family History: The patient's family history includes Epilepsy in her son; Healthy in her daughter and son; Prostate cancer in her father. There is no history of Allergic rhinitis, Angioedema, Asthma, Eczema, Immunodeficiency, or Urticaria.  ROS:   Please see the history of present illness.     All other systems reviewed and are negative.  EKGs/Labs/Other Studies Reviewed:    The following studies were reviewed today:  EKG Interpretation Date/Time:  Friday April 09 2024 15:32:18 EDT Ventricular Rate:  73 PR Interval:  142 QRS Duration:  66 QT Interval:  390 QTC Calculation: 429 R Axis:   -17  Text Interpretation: Normal sinus  rhythm Normal ECG Confirmed by Josefina Rynders [id number]) on 04/09/2024 3:49:44 PM    Recent Labs: 04/06/2024: BUN 19; Creatinine, Ser 1.01; Hemoglobin 12.5; Platelets 178; Potassium 4.2; Pro Brain Natriuretic Peptide 1,030.0; Sodium 142  Recent Lipid Panel    Component Value Date/Time   CHOL 157 01/22/2021 1252   TRIG 52 01/22/2021 1252   HDL 57 01/22/2021 1252   CHOLHDL 3.2 08/12/2018 1012   VLDL 6 09/04/2012 0550   LDLCALC 89 01/22/2021 1252   LDLCALC 108 (H) 08/12/2018 1012    Physical Exam:    VS:  BP 97/66 (BP Location: Left Arm, Cuff Size: Normal)   Pulse (!) 104   Ht 5' 4 (1.626 m)   Wt 156 lb (70.8 kg)   SpO2 96%   BMI 26.78 kg/m     Wt Readings from Last 3 Encounters:  04/09/24 156 lb (70.8 kg)  04/06/24 159 lb (72.1 kg)  03/25/24 160 lb 12.8 oz (72.9 kg)    Orthostatic Vitals:  Lying:   168/85,   HR 71  Sitting:   126/78,   HR 77  Standing:   109/73,   HR 88  3 mintues:    97/66,    HR 104  GEN:  Well nourished, well developed in no acute distress HEENT: Normal NECK: No carotid bruits CARDIAC: S1-S2 normal, RRR, no murmurs, rubs, gallops RESPIRATORY:  Clear to auscultation without rales, wheezing or rhonchi  ABDOMEN: Soft, non-tender, non-distended MUSCULOSKELETAL:  No edema; No deformity  SKIN: Warm and dry NEUROLOGIC:  Alert and oriented x 3 PSYCHIATRIC:  Normal affect   ASSESSMENT:    1. Sinus tachycardia   2. Palpitations    PLAN:    In order of problems listed above:  Orthostatic Hypotension Sinus Tachycardia  EKG: NSR 73 BPM at rest  Positive orthostatic changes with replication of described symptoms of chest tightness, nausea, lightheadedness.  Drop of systolic 42 mmHg from lying to sitting, with overall systolic decrease of 71 mmHg from start to finish   HR increase from 71 to 104 Considering lack of widened pulse pressure (averaging 30s-40s in upright positions), low suspicion for valvular etiology of symptoms.  These values were obtained  in the afternoon around 4:45 pm, just before the timing of her afternoon dose of midodrine   Starting low dose midodrine  mildly improved  symptoms.   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 BNP 1030 Denies CP, SOB, orthopnea, significant leg swelling from baseline in office  Patient had stopped taking her lasix  prior to ED arrival  Symptoms improved with lasix  administration Considering orthostatic hypotension, plan to continue lasix  daily only as needed for weight gain of 3lb overnight or 5 lb in one week, to be held if systolic BP <110  Moderate aortic valve regurgitation  Prior echo showed mild aortic valve regurg from 01/20/2023  Plan to recheck echo in 11/2024, could consider sooner if symptoms worsen with widening pulse pressure  History of PE Aneurysm of ascending aorta without rupture, stable from 2010 to 2025 04/06/2024 CT angio Chest shows no evidence of pulmonary embolism w/ stable AAA of 4.5 cm Per Dr. Godfrey note from 10/14/2022, continue eliquis  dosing of 2.5 mg BID for embolic prevention Denies bleeding/bruising/syncope  Follow up with Dr. Ladona in 6-8 weeks    Medication Adjustments/Labs and Tests Ordered: Current medicines are reviewed at length with the patient today.  Concerns regarding medicines are outlined above.  Orders Placed This Encounter  Procedures   LONG TERM MONITOR (3-14 DAYS)   EKG 12-Lead   No orders of the defined types were  placed in this encounter.   Patient Instructions  Medication Instructions:  Lasix  20 mg daily as needed only for weight gain on 3lb overnight or 5 lb in 1 week. Hold if systolic BP (top number) is less than 110.   *If you need a refill on your cardiac medications before your next appointment, please call your pharmacy*  Lab Work: NONE ordered at this time of appointment   Testing/Procedures: ZIO XT- Long Term Monitor Instructions  Your physician has requested you wear a ZIO patch monitor for 14 days.  This is a single patch monitor. [employer] supplies one patch monitor per enrollment. Additional stickers are not available. Please do not apply patch if you will be having a Nuclear Stress Test,  Echocardiogram, Cardiac CT, MRI, or Chest Xray during the period you would be wearing the  monitor. The patch cannot be worn during these tests. You cannot remove and re-apply the  ZIO XT patch monitor.  Your ZIO patch monitor will be mailed 3 day USPS to your address on file. It may take 3-5 days  to receive your monitor after you have been enrolled.  Once you have received your monitor, please review the enclosed instructions. Your monitor  has already been registered assigning a specific monitor serial # to you.  Billing and Patient Assistance Program Information  We have supplied [employer] with any of your insurance information on file for billing purposes. [employer] offers a sliding scale Patient Assistance Program for patients that do not have  insurance, or whose insurance does not completely cover the cost of the ZIO monitor.  You must apply for the Patient Assistance Program to qualify for this discounted rate.  To apply, please call [employer] at [phone], select option 4, select option 2, ask to apply for  Patient Assistance Program. Meredeth will ask your  household income, and how many people  are in your household. They will quote your out-of-pocket cost based on that information.   [employer] will also be able to set up a 91-month, interest-free payment plan if needed.  Applying the monitor   Shave hair from upper left chest.  Hold abrader disc by orange tab. Rub abrader in 40 strokes over the upper left chest as  indicated in your monitor instructions.  Clean area with 4 enclosed alcohol  pads. Let dry.  Apply patch as indicated in monitor instructions. Patch will be placed under collarbone on left  side of chest with arrow pointing upward.  Rub patch adhesive wings for 2 minutes. Remove white label marked 1. Remove the white  label marked 2. Rub patch adhesive wings for 2 additional minutes.  While looking in a mirror, press and release button in center of patch. A small green light will  flash 3-4 times. This will be your only indicator that the monitor has been turned on.  Do not shower for the first 24 hours. You may shower after the first 24 hours.  Press the button if you feel a symptom. You will hear a small click. Record Date, Time and  Symptom in the Patient Logbook.  When you are ready to remove the patch, follow instructions on the last 2 pages of Patient  Logbook. Stick patch monitor onto the last page of Patient Logbook.  Place Patient Logbook in the blue and white box. Use locking tab on box and tape box closed  securely. The blue and white box has prepaid postage on it. Please place it in the mailbox as  soon as possible. Your physician should have your test results approximately 7 days after the  monitor has been mailed back to [facility].  Call [facility] Customer Care at [phone] if you have questions regarding  your ZIO XT patch monitor. Call them immediately if you see an orange light blinking on your  monitor.  If your monitor falls off in less than 4 days, contact our Monitor department at [phone].  If your monitor becomes loose or falls off after 4 days call [employer] at [phone] for  suggestions on securing your  monitor  Follow-Up: At [facility], you and your health needs are our priority.  As part of our continuing mission to provide you with exceptional heart care, our providers are all part of one team.  This team includes your primary Cardiologist (physician) and Advanced Practice Providers or APPs (Physician Assistants and Nurse Practitioners) who all work together to provide you with the care you need, when you need it.  Your next appointment:   6-8 week(s)  Provider:   Dr. Ladona  We recommend signing up for the patient portal called MyChart.  Sign up information is provided on this After Visit Summary.  MyChart is used to connect with patients for Virtual Visits (Telemedicine).  Patients are able to view lab/test results, encounter notes, upcoming appointments, etc.  Non-urgent messages can be sent to your provider as well.   To learn more about what you can do with MyChart, go to [email or web address].   Other Instructions Abdominal Binder & Thigh Sleeves recommended. May find at [place], [place], ect.      Orthostatic Hypotension Blood pressure is a measurement of how strongly, or weakly, your circulating blood is pressing against the walls of your arteries. Orthostatic hypotension is a drop in blood pressure that can happen when you change positions, such as when  you go from lying down to standing. Arteries are blood vessels that carry blood from your heart throughout your body. When blood pressure is too low, you may not get enough blood to your brain or to the rest of your organs. Orthostatic hypotension can cause light-headedness, sweating, rapid heartbeat, blurred vision, and fainting. These symptoms require further investigation into the cause. What are the causes? Orthostatic hypotension can be caused by many things, including: Sudden changes in posture, such as standing up quickly after you have been sitting or lying down. Loss of blood (anemia) or loss of body fluids  (dehydration). Heart problems, neurologic problems, or hormone problems. Pregnancy. Aging. The risk for this condition increases as you get older. Severe infection (sepsis). Certain medicines, such as medicines for high blood pressure or medicines that make the body lose excess fluids (diuretics). What are the signs or symptoms? Symptoms of this condition may include: Weakness, light-headedness, or dizziness. Sweating. Blurred vision. Tiredness (fatigue). Rapid heartbeat. Fainting, in severe cases. How is this diagnosed? This condition is diagnosed based on: Your symptoms and medical history. Your blood pressure measurements. Your health care provider will check your blood pressure when you are: Lying down. Sitting. Standing. A blood pressure reading is recorded as two numbers, such as 120 over 80 (or 120/80). The first (top) number is called the systolic pressure. It is a measure of the pressure in your arteries as your heart beats. The second (bottom) number is called the diastolic pressure. It is a measure of the pressure in your arteries when your heart relaxes between beats. Blood pressure is measured in a unit called mmHg. Healthy blood pressure for most adults is 120/80 mmHg. Orthostatic hypotension is defined as a 20 mmHg drop in systolic pressure or a 10 mmHg drop in diastolic pressure within 3 minutes of standing. Other information or tests that may be used to diagnose orthostatic hypotension include: Your other vital signs, such as your heart rate and temperature. Blood tests. An electrocardiogram (ECG) or echocardiogram. A Holter monitor. This is a device you wear that records your heart rhythm continuously, usually for 24-48 hours. Tilt table test. For this test, you will be safely secured to a table that moves you from a lying position to an upright position. Your heart rhythm and blood pressure will be monitored during the test. How is this treated? This condition  may be treated by: Changing your diet. This may involve eating more salt (sodium) or drinking more water. Changing the dosage of certain medicines you are taking that might be lowering your blood pressure. Correcting the underlying reason for the orthostatic hypotension. Wearing compression stockings. Taking medicines to raise your blood pressure. Avoiding actions that trigger symptoms. Follow these instructions at home: Medicines Take over-the-counter and prescription medicines only as told by your health care provider. Follow instructions from your health care provider about changing the dosage of your current medicines, if this applies. Do not stop or adjust any of your medicines on your own. Eating and drinking  Drink enough fluid to keep your urine pale yellow. Eat extra salt only as directed. Do not add extra salt to your diet unless advised by your health care provider. Eat frequent, small meals. Avoid standing up suddenly after eating. General instructions  Get up slowly from lying down or sitting positions. This gives your blood pressure a chance to adjust. Avoid hot showers and excessive heat as directed by your health care provider. Engage in regular physical activity as directed by your  health care provider. If you have compression stockings, wear them as told. Keep all follow-up visits. This is important. Contact a health care provider if: You have a fever for more than 2-3 days. You feel more thirsty than usual. You feel dizzy or weak. Get help right away if: You have chest pain. You have a fast or irregular heartbeat. You become sweaty or feel light-headed. You feel short of breath. You faint. You have any symptoms of a stroke. BE FAST is an easy way to remember the main warning signs of a stroke: B - Balance. Signs are dizziness, sudden trouble walking, or loss of balance. E - Eyes. Signs are trouble seeing or a sudden change in vision. F - Face. Signs are  sudden weakness or numbness of the face, or the face or eyelid drooping on one side. A - Arms. Signs are weakness or numbness in an arm. This happens suddenly and usually on one side of the body. S - Speech. Signs are sudden trouble speaking, slurred speech, or trouble understanding what people say. T - Time. Time to call emergency services. Write down what time symptoms started. You have other signs of a stroke, such as: A sudden, severe headache with no known cause. Nausea or vomiting. Seizure. These symptoms may represent a serious problem that is an emergency. Do not wait to see if the symptoms will go away. Get medical help right away. Call your local emergency services (911 in the [place] Do not drive yourself to the hospital. Summary Orthostatic hypotension is a sudden drop in blood pressure. It can cause light-headedness, sweating, rapid heartbeat, blurred vision, and fainting. Orthostatic hypotension can be diagnosed by having your blood pressure taken while lying down, sitting, and then standing. Treatment may involve changing your diet, wearing compression stockings, sitting up slowly, adjusting your medicines, or correcting the underlying reason for the orthostatic hypotension. Get help right away if you have chest pain, a fast or irregular heartbeat, or symptoms of a stroke. This information is not intended to replace advice given to you by your health care provider. Make sure you discuss any questions you have with your health care provider. Document Revised: 10/05/2020 Document Reviewed: 10/05/2020 Elsevier Patient Education  [address]   Signed, Miriam FORBES Shams, NP  04/10/2024 12:35 PM    [facility]

## 2024-04-09 ENCOUNTER — Encounter: Payer: Self-pay | Admitting: Nurse Practitioner

## 2024-04-09 ENCOUNTER — Ambulatory Visit: Attending: Nurse Practitioner | Admitting: Emergency Medicine

## 2024-04-09 ENCOUNTER — Ambulatory Visit (INDEPENDENT_AMBULATORY_CARE_PROVIDER_SITE_OTHER)

## 2024-04-09 VITALS — BP 97/66 | HR 104 | Ht 64.0 in | Wt 156.0 lb

## 2024-04-09 DIAGNOSIS — I951 Orthostatic hypotension: Secondary | ICD-10-CM

## 2024-04-09 DIAGNOSIS — I7121 Aneurysm of the ascending aorta, without rupture: Secondary | ICD-10-CM

## 2024-04-09 DIAGNOSIS — Z86711 Personal history of pulmonary embolism: Secondary | ICD-10-CM

## 2024-04-09 DIAGNOSIS — I351 Nonrheumatic aortic (valve) insufficiency: Secondary | ICD-10-CM | POA: Diagnosis not present

## 2024-04-09 DIAGNOSIS — R000 Tachycardia, unspecified: Secondary | ICD-10-CM | POA: Diagnosis not present

## 2024-04-09 DIAGNOSIS — I5032 Chronic diastolic (congestive) heart failure: Secondary | ICD-10-CM | POA: Diagnosis not present

## 2024-04-09 DIAGNOSIS — R002 Palpitations: Secondary | ICD-10-CM

## 2024-04-09 NOTE — Patient Instructions (Addendum)
 Medication Instructions:  Lasix  20 mg daily as needed only for weight gain on 3lb overnight or 5 lb in 1 week. Hold if systolic BP (top number) is less than 110.   *If you need a refill on your cardiac medications before your next appointment, please call your pharmacy*  Lab Work: NONE ordered at this time of appointment   Testing/Procedures: ZIO XT- Long Term Monitor Instructions  Your physician has requested you wear a ZIO patch monitor for 14 days.  This is a single patch monitor. [employer] supplies one patch monitor per enrollment. Additional stickers are not available. Please do not apply patch if you will be having a Nuclear Stress Test,  Echocardiogram, Cardiac CT, MRI, or Chest Xray during the period you would be wearing the  monitor. The patch cannot be worn during these tests. You cannot remove and re-apply the  ZIO XT patch monitor.  Your ZIO patch monitor will be mailed 3 day USPS to your address on file. It may take 3-5 days  to receive your monitor after you have been enrolled.  Once you have received your monitor, please review the enclosed instructions. Your monitor  has already been registered assigning a specific monitor serial # to you.  Billing and Patient Assistance Program Information  We have supplied [employer] with any of your insurance information on file for billing purposes. [employer] offers a sliding scale Patient Assistance Program for patients that do not have  insurance, or whose insurance does not completely cover the cost of the ZIO monitor.  You must apply for the Patient Assistance Program to qualify for this discounted rate.  To apply, please call [employer] at [phone], select option 4, select option 2, ask to apply for  Patient Assistance Program. Meredeth will ask your household income, and how many people  are in your household. They will quote your out-of-pocket cost based on that information.  [employer] will also be able to set up a 12-month,  interest-free payment plan if needed.  Applying the monitor   Shave hair from upper left chest.  Hold abrader disc by orange tab. Rub abrader in 40 strokes over the upper left chest as  indicated in your monitor instructions.  Clean area with 4 enclosed alcohol  pads. Let dry.  Apply patch as indicated in monitor instructions. Patch will be placed under collarbone on left  side of chest with arrow pointing upward.  Rub patch adhesive wings for 2 minutes. Remove white label marked 1. Remove the white  label marked 2. Rub patch adhesive wings for 2 additional minutes.  While looking in a mirror, press and release button in center of patch. A small green light will  flash 3-4 times. This will be your only indicator that the monitor has been turned on.  Do not shower for the first 24 hours. You may shower after the first 24 hours.  Press the button if you feel a symptom. You will hear a small click. Record Date, Time and  Symptom in the Patient Logbook.  When you are ready to remove the patch, follow instructions on the last 2 pages of Patient  Logbook. Stick patch monitor onto the last page of Patient Logbook.  Place Patient Logbook in the blue and white box. Use locking tab on box and tape box closed  securely. The blue and white box has prepaid postage on it. Please place it in the mailbox as  soon as possible. Your physician should have your test results approximately 7  days after the  monitor has been mailed back to [place].  Call [facility] Customer Care at [phone] if you have questions regarding  your ZIO XT patch monitor. Call them immediately if you see an orange light blinking on your  monitor.  If your monitor falls off in less than 4 days, contact our Monitor department at [phone].  If your monitor becomes loose or falls off after 4 days call [employer] at [phone] for  suggestions on securing your monitor  Follow-Up: At [facility], you and  your health needs are our priority.  As part of our continuing mission to provide you with exceptional heart care, our providers are all part of one team.  This team includes your primary Cardiologist (physician) and Advanced Practice Providers or APPs (Physician Assistants and Nurse Practitioners) who all work together to provide you with the care you need, when you need it.  Your next appointment:   6-8 week(s)  Provider:   Dr. Ladona  We recommend signing up for the patient portal called MyChart.  Sign up information is provided on this After Visit Summary.  MyChart is used to connect with patients for Virtual Visits (Telemedicine).  Patients are able to view lab/test results, encounter notes, upcoming appointments, etc.  Non-urgent messages can be sent to your provider as well.   To learn more about what you can do with MyChart, go to [email or web address].   Other Instructions Abdominal Binder & Thigh Sleeves recommended. May find at [place], [place], ect.      Orthostatic Hypotension Blood pressure is a measurement of how strongly, or weakly, your circulating blood is pressing against the walls of your arteries. Orthostatic hypotension is a drop in blood pressure that can happen when you change positions, such as when you go from lying down to standing. Arteries are blood vessels that carry blood from your heart throughout your body. When blood pressure is too low, you may not get enough blood to your brain or to the rest of your organs. Orthostatic hypotension can cause light-headedness, sweating, rapid heartbeat, blurred vision, and fainting. These symptoms require further investigation into the cause. What are the causes? Orthostatic hypotension can be caused by many things, including: Sudden changes in posture, such as standing up quickly after you have been sitting or lying down. Loss of blood (anemia) or loss of body fluids (dehydration). Heart problems, neurologic problems, or  hormone problems. Pregnancy. Aging. The risk for this condition increases as you get older. Severe infection (sepsis). Certain medicines, such as medicines for high blood pressure or medicines that make the body lose excess fluids (diuretics). What are the signs or symptoms? Symptoms of this condition may include: Weakness, light-headedness, or dizziness. Sweating. Blurred vision. Tiredness (fatigue). Rapid heartbeat. Fainting, in severe cases. How is this diagnosed? This condition is diagnosed based on: Your symptoms and medical history. Your blood pressure measurements. Your health care provider will check your blood pressure when you are: Lying down. Sitting. Standing. A blood pressure reading is recorded as two numbers, such as 120 over 80 (or 120/80). The first (top) number is called the systolic pressure. It is a measure of the pressure in your arteries as your heart beats. The second (bottom) number is called the diastolic pressure. It is a measure of the pressure in your arteries when your heart relaxes between beats. Blood pressure is measured in a unit called mmHg. Healthy blood pressure for most adults is 120/80 mmHg. Orthostatic hypotension is defined as a 20  mmHg drop in systolic pressure or a 10 mmHg drop in diastolic pressure within 3 minutes of standing. Other information or tests that may be used to diagnose orthostatic hypotension include: Your other vital signs, such as your heart rate and temperature. Blood tests. An electrocardiogram (ECG) or echocardiogram. A Holter monitor. This is a device you wear that records your heart rhythm continuously, usually for 24-48 hours. Tilt table test. For this test, you will be safely secured to a table that moves you from a lying position to an upright position. Your heart rhythm and blood pressure will be monitored during the test. How is this treated? This condition may be treated by: Changing your diet. This may involve  eating more salt (sodium) or drinking more water. Changing the dosage of certain medicines you are taking that might be lowering your blood pressure. Correcting the underlying reason for the orthostatic hypotension. Wearing compression stockings. Taking medicines to raise your blood pressure. Avoiding actions that trigger symptoms. Follow these instructions at home: Medicines Take over-the-counter and prescription medicines only as told by your health care provider. Follow instructions from your health care provider about changing the dosage of your current medicines, if this applies. Do not stop or adjust any of your medicines on your own. Eating and drinking  Drink enough fluid to keep your urine pale yellow. Eat extra salt only as directed. Do not add extra salt to your diet unless advised by your health care provider. Eat frequent, small meals. Avoid standing up suddenly after eating. General instructions  Get up slowly from lying down or sitting positions. This gives your blood pressure a chance to adjust. Avoid hot showers and excessive heat as directed by your health care provider. Engage in regular physical activity as directed by your health care provider. If you have compression stockings, wear them as told. Keep all follow-up visits. This is important. Contact a health care provider if: You have a fever for more than 2-3 days. You feel more thirsty than usual. You feel dizzy or weak. Get help right away if: You have chest pain. You have a fast or irregular heartbeat. You become sweaty or feel light-headed. You feel short of breath. You faint. You have any symptoms of a stroke. BE FAST is an easy way to remember the main warning signs of a stroke: B - Balance. Signs are dizziness, sudden trouble walking, or loss of balance. E - Eyes. Signs are trouble seeing or a sudden change in vision. F - Face. Signs are sudden weakness or numbness of the face, or the face or  eyelid drooping on one side. A - Arms. Signs are weakness or numbness in an arm. This happens suddenly and usually on one side of the body. S - Speech. Signs are sudden trouble speaking, slurred speech, or trouble understanding what people say. T - Time. Time to call emergency services. Write down what time symptoms started. You have other signs of a stroke, such as: A sudden, severe headache with no known cause. Nausea or vomiting. Seizure. These symptoms may represent a serious problem that is an emergency. Do not wait to see if the symptoms will go away. Get medical help right away. Call your local emergency services (911 in the [place] Do not drive yourself to the hospital. Summary Orthostatic hypotension is a sudden drop in blood pressure. It can cause light-headedness, sweating, rapid heartbeat, blurred vision, and fainting. Orthostatic hypotension can be diagnosed by having your blood pressure taken while lying down,  sitting, and then standing. Treatment may involve changing your diet, wearing compression stockings, sitting up slowly, adjusting your medicines, or correcting the underlying reason for the orthostatic hypotension. Get help right away if you have chest pain, a fast or irregular heartbeat, or symptoms of a stroke. This information is not intended to replace advice given to you by your health care provider. Make sure you discuss any questions you have with your health care provider. Document Revised: 10/05/2020 Document Reviewed: 10/05/2020 Elsevier Patient Education  2024 [employer].

## 2024-04-09 NOTE — Progress Notes (Unsigned)
 Applied a 14 day Zio XT monitor to patient in the office  Ganji to read

## 2024-04-16 ENCOUNTER — Telehealth: Payer: Self-pay | Admitting: Neurology

## 2024-04-16 NOTE — Telephone Encounter (Signed)
 Pt called and LMOM. She needs an appt by 04/30/24. She went to her heart doctor and they want her to see Tat soon to get her medication increased. She wants to talk a nurse about this

## 2024-04-19 ENCOUNTER — Telehealth: Payer: Self-pay | Admitting: Neurology

## 2024-04-19 DIAGNOSIS — I10 Essential (primary) hypertension: Secondary | ICD-10-CM | POA: Diagnosis not present

## 2024-04-19 DIAGNOSIS — I471 Supraventricular tachycardia, unspecified: Secondary | ICD-10-CM | POA: Diagnosis not present

## 2024-04-19 DIAGNOSIS — G909 Disorder of the autonomic nervous system, unspecified: Secondary | ICD-10-CM | POA: Diagnosis not present

## 2024-04-19 DIAGNOSIS — I951 Orthostatic hypotension: Secondary | ICD-10-CM | POA: Diagnosis not present

## 2024-04-19 DIAGNOSIS — K7689 Other specified diseases of liver: Secondary | ICD-10-CM | POA: Diagnosis not present

## 2024-04-19 DIAGNOSIS — G20A1 Parkinson's disease without dyskinesia, without mention of fluctuations: Secondary | ICD-10-CM | POA: Diagnosis not present

## 2024-04-19 DIAGNOSIS — M8589 Other specified disorders of bone density and structure, multiple sites: Secondary | ICD-10-CM | POA: Diagnosis not present

## 2024-04-19 DIAGNOSIS — K219 Gastro-esophageal reflux disease without esophagitis: Secondary | ICD-10-CM | POA: Diagnosis not present

## 2024-04-19 DIAGNOSIS — K869 Disease of pancreas, unspecified: Secondary | ICD-10-CM | POA: Diagnosis not present

## 2024-04-19 NOTE — Telephone Encounter (Signed)
 Pt called in this afternoon, and she does not want to change  the dose of her prescription called:   midodrine  (PROAMATINE ) 2.5 MG tablet . She  want to keep anything the same. Pt saw Dr. Shayne. Pt. Stated if any changes  needs to be made , please call her. Thanks

## 2024-04-20 NOTE — Telephone Encounter (Signed)
 Calling bk for Rx directions

## 2024-04-20 NOTE — Telephone Encounter (Signed)
 Called and left a message per Doctor Tat on voice mail. Told to called back if questions or concerns.

## 2024-04-21 ENCOUNTER — Other Ambulatory Visit (HOSPITAL_COMMUNITY): Payer: Self-pay | Admitting: Internal Medicine

## 2024-04-21 ENCOUNTER — Telehealth (HOSPITAL_COMMUNITY): Payer: Self-pay | Admitting: Pharmacy Technician

## 2024-04-21 NOTE — Telephone Encounter (Signed)
 Auth Submission: APPROVED Site of care: [facility] Payer: [employer] [facility]) Medication & CPT/J Code(s) submitted: Prolia  (Denosumab ) [id number] Diagnosis Code: M81.0 Route of submission (phone, fax, portal):  Phone # Fax # Auth type: Buy/Bill HB Units/visits requested: 60mg  x 2 doses, q 6 months Reference number: [id number] Approval from: 10/20/23 to 08/04/24   MD office submitted auth. Called [employer] and updated Facility NPI to [facility] [place].  Amiir Heckard, CPhT [facility] Phone: [phone] 04/21/2024

## 2024-04-26 ENCOUNTER — Other Ambulatory Visit: Payer: Self-pay | Admitting: Neurology

## 2024-04-26 DIAGNOSIS — G20A1 Parkinson's disease without dyskinesia, without mention of fluctuations: Secondary | ICD-10-CM

## 2024-04-27 NOTE — Telephone Encounter (Signed)
 Called and talked with pt and informed her per Dr. Evonnie  results. She understood.

## 2024-04-27 NOTE — Progress Notes (Signed)
 Cardiology Office Note:  .   Date:  05/11/2024  ID:  Sherrilyn CHRISTELLA Ridges, DOB 02-16-1936, MRN [id number] PCP: Shayne Anes, MD  [facility] Providers Cardiologist:  Gordy Bergamo, MD    History of Present Illness: .   Linda Lee is a 88 y.o. female  with a hx of coronary artery calcification noted on CT, chronic diastolic CHF, ascending aorta dilation, NSVT, PE, orthostatic hypotension, hyperlipidemia, Parkinson's disease, complicated migraine, severe spinal stenosis, and GERD.  Cardiac catheterization in 2018 in the setting of chest pain and dyspnea on exertion revealed normal coronary arteries. Cardiac event monitor in 2020 following TIA revealed brief episodes of NSVT, no evidence of atrial fibrillation, no significant arrythmia. She was hospitalized in 01/2022 in the setting of acute submassive PE. She is on lifelong anticoagulation.   Most recent echocardiogram in 11/2023 showed LVEF 60-65%, normal LV function, no RWMA, G1 DD, normal RV SF, normal RV size, moderately elevated PASP, moderate aortic valve regurgitation, dilation of the ascending aorta measuring 44 mm. She was last seen in the office on 10/14/2022 and was stable from a cardiac standpoint. Lasix  and propranolol  were discontinued in the setting of hypotension, she was started on low dose midodrine  per neurology in 02/2024.   She was evaluated in the ED on 04/06/2024 in the setting of tachycardia, dyspnea on exertion, and bilateral lower extremity edema. Ct angio of the chest was negative for PE. EKG showed sinus tachycardia. Troponin was negative x2. BNP was slightly elevated. She was diuresed with IV Lasix  and discharged home in stable condition.  Was seen in f/u and felt parkinson's dysautonomia contributing. Zio showed 381 episodes SVT and referred to EPS for possible antiarrhythmic. Can't start BB or CCB with hypotension.   Patient here for f/u. Says her HR was 136 this am but had come down. BP still low on low dose  midodrine . Some dizziness. Has had an occasional BP reading of 170 systolic but not often.   ROS:    Studies Reviewed: SABRA         Prior CV Studies:   Most recent echo in 11/2023 showed LVEF 60-65%, normal LV function, no RWMA, G1 DD, normal RV SF, normal RV size, moderately elevated PASP, moderate aortic valve regurgitation, dilation of the ascending aorta measuring 44 mm.  Risk Assessment/Calculations:             Physical Exam:   VS:  BP 100/68   Pulse 85   Ht 5' 4 (1.626 m)   Wt 161 lb 12.8 oz (73.4 kg)   SpO2 97%   BMI 27.77 kg/m    Orhtostatics: No data found. Wt Readings from Last 3 Encounters:  05/11/24 161 lb 12.8 oz (73.4 kg)  04/09/24 156 lb (70.8 kg)  04/06/24 159 lb (72.1 kg)    GEN: Well nourished, well developed in no acute distress NECK: No JVD; No carotid bruits CARDIAC:  RRR, no murmurs, rubs, gallops RESPIRATORY:  Clear to auscultation without rales, wheezing or rhonchi  ABDOMEN: Soft, non-tender, non-distended EXTREMITIES:  No edema; No deformity   ASSESSMENT AND PLAN: .   Orthostatic hypotension on midodrine . 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 Off propanolol due to orthostatic hypostension. I made an appt with Dr. Almetta Nov 13.   Chronic diastolic CHF Most recent echo stable as above. Lasix  was previously discontinued in the setting of severe orthostatic hypotension.  Recent ED visit with concern for mild  fluid volume overload, received IV Lasix  x1.  She has stable nonpitting bilateral lower extremity edema, well compensated.  Will resume Lasix  20 mg daily as needed for swelling, weight gain.  Hold for SBP <110 mmHg.   Moderate aortic valve regurgitation Moderate on most recent echo. Will plan for repeat echo in 11/2024.   History of PE No recurrence.  On lifelong anticoagulation, denies bleeding.  Continue Eliquis .   Dilation of the ascending aorta Measured  44 mm on most recent echo in 11/2023.  CT angio chest in 04/2024 showed stable dilation of the ascending aorta measuring 4.5 cm.  Will plan to monitor with repeat echo in 11/2024 as above.   Hyperlipidemia No recent LDL on file. Goal LDL <70. Monitored per PCP. Continue Lipitor.         Dispo: f/u as scheduled  Signed, Olivia Pavy, PA-C

## 2024-04-27 NOTE — Telephone Encounter (Signed)
 Left message on machine for patient to call back.

## 2024-04-28 ENCOUNTER — Other Ambulatory Visit: Payer: Self-pay

## 2024-04-28 DIAGNOSIS — R000 Tachycardia, unspecified: Secondary | ICD-10-CM

## 2024-04-28 DIAGNOSIS — R002 Palpitations: Secondary | ICD-10-CM

## 2024-04-28 MED ORDER — MIDODRINE HCL 2.5 MG PO TABS: ORAL_TABLET | ORAL | 1 refills | Status: DC

## 2024-04-29 ENCOUNTER — Ambulatory Visit: Payer: Self-pay | Admitting: Emergency Medicine

## 2024-04-29 DIAGNOSIS — I471 Supraventricular tachycardia, unspecified: Secondary | ICD-10-CM

## 2024-04-29 NOTE — Telephone Encounter (Signed)
 Called pt and she is okay with medicine order. I put a new order In per Dr. Evonnie

## 2024-04-30 ENCOUNTER — Ambulatory Visit (HOSPITAL_COMMUNITY)
Admission: RE | Admit: 2024-04-30 | Discharge: 2024-04-30 | Disposition: A | Discharge status: Home / Self Care | Source: Ambulatory Visit | Attending: Internal Medicine | Admitting: Internal Medicine

## 2024-04-30 VITALS — BP 115/77 | HR 78 | Temp 97.7°F | Resp 16

## 2024-04-30 DIAGNOSIS — M810 Age-related osteoporosis without current pathological fracture: Secondary | ICD-10-CM | POA: Insufficient documentation

## 2024-04-30 MED ORDER — DENOSUMAB 60 MG/ML ~~LOC~~ SOSY
60.0000 mg | PREFILLED_SYRINGE | Freq: Once | SUBCUTANEOUS | Status: AC
  Administered 2024-04-30: 60 mg via SUBCUTANEOUS

## 2024-04-30 MED ORDER — DENOSUMAB 60 MG/ML ~~LOC~~ SOSY
PREFILLED_SYRINGE | SUBCUTANEOUS | Status: AC
  Filled 2024-04-30: qty 1

## 2024-05-11 ENCOUNTER — Encounter: Payer: Self-pay | Admitting: Physician Assistant

## 2024-05-11 ENCOUNTER — Ambulatory Visit: Attending: Physician Assistant | Admitting: Physician Assistant

## 2024-05-11 VITALS — BP 100/68 | HR 85 | Ht 64.0 in | Wt 161.8 lb

## 2024-05-11 DIAGNOSIS — Z86711 Personal history of pulmonary embolism: Secondary | ICD-10-CM

## 2024-05-11 DIAGNOSIS — I351 Nonrheumatic aortic (valve) insufficiency: Secondary | ICD-10-CM | POA: Diagnosis not present

## 2024-05-11 DIAGNOSIS — I951 Orthostatic hypotension: Secondary | ICD-10-CM

## 2024-05-11 DIAGNOSIS — I5032 Chronic diastolic (congestive) heart failure: Secondary | ICD-10-CM | POA: Diagnosis not present

## 2024-05-11 DIAGNOSIS — I471 Supraventricular tachycardia, unspecified: Secondary | ICD-10-CM

## 2024-05-11 DIAGNOSIS — I7121 Aneurysm of the ascending aorta, without rupture: Secondary | ICD-10-CM

## 2024-05-11 DIAGNOSIS — E7800 Pure hypercholesterolemia, unspecified: Secondary | ICD-10-CM | POA: Diagnosis not present

## 2024-05-11 NOTE — Patient Instructions (Signed)
 Medication Instructions:  Your physician recommends that you continue on your current medications as directed. Please refer to the Current Medication list given to you today.  *If you need a refill on your cardiac medications before your next appointment, please call your pharmacy*  Follow-Up: At [facility], you and your health needs are our priority.  As part of our continuing mission to provide you with exceptional heart care, our providers are all part of one team.  This team includes your primary Cardiologist (physician) and Advanced Practice Providers or APPs (Physician Assistants and Nurse Practitioners) who all work together to provide you with the care you need, when you need it.  Your next appointment:    With EP as scheduled - Thursday 11/13 at 1:45 pm   Provider: Donnice Primus, MD     Other Instructions Please purchase an abdominal binder. See references below:  Abdominal Binder:  ORTONYX  This item: Umbilical Hernia Belt for Men and Women - Abdominal Support Binder with Compression Pad - Navel Ventral Epigastric Incisional and Belly Button Hernias Surgery Prevention Aid (Large-XXL)     ORTONYX  Umbilical Hernia Belt for Men and Women Abdominal Binder Hernia Support with Silicone Compression Pad Navel Ventral Epigastric Incisional and Belly Button Hernias Surgery Prevention Black [id number]     ORTONYX Umbilical Hernia Belt for Men and Women - Abdominal Support Binder with Compression Pad - Navel Ventral Epigastric Incisional and Belly Button Hernias Surgery Prevention Aid (Large-XXL)  by ORTONYX

## 2024-05-20 ENCOUNTER — Other Ambulatory Visit: Payer: Self-pay | Admitting: Neurology

## 2024-05-20 DIAGNOSIS — G20A1 Parkinson's disease without dyskinesia, without mention of fluctuations: Secondary | ICD-10-CM

## 2024-05-27 DIAGNOSIS — H0014 Chalazion left upper eyelid: Secondary | ICD-10-CM | POA: Diagnosis not present

## 2024-06-07 ENCOUNTER — Encounter: Payer: Self-pay | Admitting: Nurse Practitioner

## 2024-06-07 DIAGNOSIS — H0014 Chalazion left upper eyelid: Secondary | ICD-10-CM | POA: Diagnosis not present

## 2024-06-17 ENCOUNTER — Ambulatory Visit
Attending: Student in an Organized Health Care Education/Training Program | Admitting: Student in an Organized Health Care Education/Training Program

## 2024-06-17 ENCOUNTER — Encounter: Payer: Self-pay | Admitting: Student in an Organized Health Care Education/Training Program

## 2024-06-17 VITALS — BP 110/68 | HR 76 | Ht 64.0 in | Wt 159.0 lb

## 2024-06-17 DIAGNOSIS — I4719 Other supraventricular tachycardia: Secondary | ICD-10-CM

## 2024-06-17 NOTE — Progress Notes (Signed)
 Cardiology Office Note   Date:  06/17/2024  ID:  Linda Lee, DOB [birth year], MRN [id number] PCP: Shayne Anes, MD  [facility] Providers Cardiologist:  Gordy Bergamo, MD Electrophysiologist:  Donnice DELENA Primus, MD   History of Present Illness Linda Lee is a 88 y.o. female with HFpEF, AsAo dilation, NSVT, PE, orthostatic hypotension, HLD, Parkinson's disease, migraines, severe spinal stenosis, and GERD who presents for evaluation of atrial tachycardia. History of Present Illness She experiences episodes of elevated heart rate, with readings initially reaching 121 bpm and sometimes up to 140 bpm. The heart rate increases with activity but decreases when she sits or lies down. She is aware of her elevated heart rate, particularly when it exceeds 120 bpm. No significant chest pain or dizziness is associated with these episodes.  Two years ago, she experienced significant fatigue and difficulty walking short distances, leading to the discovery of PE. She was started on Eliquis  and reports doing well since then.  She has a history of Parkinson's disease, which she describes as 'pretty minor' at present. She experiences tremors in one foot and one hand but is able to manage daily activities independently. She takes her Parkinson's medication three times a day, with specific timing around meals.  She was previously on pantoprazole , which was discontinued due to low blood pressure. She is currently on midodrine  to manage her blood pressure, with a morning dose of two tablets and one tablet at noon and night. She reports that midodrine  has helped, although it causes her head to itch.  She lives alone with her ten-year-old German shepherd and has children living in [place], [place], and [place]. Her daughter is a probation officer in infectious disease.  ROS: RUE/RLE tremor  Studies Reviewed  ECG review 04/09/24: NSR 73, PR 142, QRS 66, QT/c 390/429 04/06/24: ST 106,  PR 148, QRS 73, QT/c 331/440  Zio monitor Result date: 04/09/24-04/23/24 Predominant underlying rhythm was sinus rhythm.  Minimum heart rate was 45 bpm at 7:01 AM and maximum heart rate was 118 bpm at 3:04 PM with average heartbeat of 68 bpm. There were 381 SVT episodes, fastest 7 minutes at the rate of 174 bpm, longest 30 minutes at 140 bpm, EKG suggests PSVT versus atrial tachycardia with variable AV block.  These were symptomatic events. There were occasional PACs, atrial couplets and rare triplets. Isolated PVCs and ventricular couplets were noted.  Physical Exam VS:  BP 110/68   Pulse 76   Ht 5' 4 (1.626 m)   Wt 159 lb (72.1 kg)   SpO2 97%   BMI 27.29 kg/m       Wt Readings from Last 3 Encounters:  06/17/24 159 lb (72.1 kg)  05/11/24 161 lb 12.8 oz (73.4 kg)  04/09/24 156 lb (70.8 kg)    GEN: Well nourished, well developed in no acute distress CARDIAC: RRR, no murmurs, rubs, gallops RESPIRATORY:  Clear to auscultation without rales, wheezing or rhonchi  EXTREMITIES:  No edema; No deformity   ASSESSMENT AND PLAN Linda Lee is a 88 y.o. female with HFpEF, AsAo dilation, NSVT, PE, orthostatic hypotension, HLD, Parkinson's disease, migraines, severe spinal stenosis, and GERD who presents for evaluation of atrial tachycardia.  Atrial tachycardia  Reviewed recent monitor with frequent runs of atrial tachycardia longest of which lasted around 30 minutes duration.  She is minimally symptomatic with regards to these arrhythmias. Could consider low-dose flecainide without beta-blocker, observation or EPS.  Reviewed the risk, benefits and alternatives of each and she  would preference just continued observation since she is minimally symptomatic at this time.    Dispo: RTC prn   A total of 25 minutes was spent preparing for the patient, reviewing history, performing exam, document encounter, coordinating care and counseling the patient. 15 minutes was spent with direct patient  care.   Signed, Donnice DELENA Primus, MD

## 2024-07-07 NOTE — Progress Notes (Unsigned)
 Cardiology Office Note:  .   Date:  07/08/2024  ID:  Linda Lee, DOB [birth year], MRN [id number] PCP: Shayne Anes, MD   HeartCare Providers Cardiologist:  Gordy Bergamo, MD Electrophysiologist:  Donnice DELENA Primus, MD   History of Present Illness: .   Linda Lee is a 88 y.o. with history of coronary artery calcification noted on CT, chronic diastolic CHF, ascending aorta aneurysm measuring 4.4 cm with moderate aortic regurgitation, NSVT, h/o  spontaneous PE and on chronic anticoagulation, orthostatic hypotension, hyperlipidemia, Parkinson's disease, complicated migraine, severe spinal stenosis, atrial tachycardia with symptomatic palpitations that reach HR of 120=140/min documented on event monitor and longest lasting 30 min.   She was evaluated by EP and recommended low-dose flecainide without beta-blocker therapy versus observation in view of advanced age.  This is a 64-month office visit after EP consultation for palpitations.  She is presently doing well and has not had any further palpitations, except for occasional dizziness when she stands up she remains asymptomatic.    Discussed the use of AI scribe software for clinical note transcription with the patient, who gave verbal consent to proceed.  History of Present Illness Linda Lee is an 88 year old female with orthostatic hypotension and Parkinson's disease who presents with blood pressure management issues.  She has severe orthostatic hypotension with marked drops in blood pressure upon standing, causing near-syncope and requiring her to sit until symptoms resolve, including a recent episode in her backyard. Propranolol  was stopped because it worsened hypotension. She is now on midodrine , but blood pressure remains labile and unpredictable.  Her Parkinson's disease contributes to autonomic instability. Despite midodrine , she continues to have significant blood pressure fluctuations that limit daily  activities.  She has a 4.4 cm ascending aortic aneurysm that has been stable on serial imaging, with no change on the most recent study in September 2023.  She has atrial tachycardia and atrial fibrillation. She keeps propranolol  at home to use for episodes of rapid heart rate.  Cardiac Studies relevent.    CTA Chest 04/06/2024: 1. No evidence for pulmonary embolism. 2. Stable dilatation of the ascending aorta measuring 4.5 cm ascending thoracic aortic aneurysm.  ECHOCARDIOGRAM COMPLETE 11/13/2023  1. GLS is borderline. Left ventricular ejection fraction, by estimation, is 60 to 65%. The left ventricle has normal function. The left ventricle has no regional wall motion abnormalities. Left ventricular diastolic parameters are consistent with  Grade I diastolic dysfunction (impaired relaxation). The average left ventricular global longitudinal strain is -17.0 %. 2. The aortic valve is tricuspid. Aortic valve regurgitation is moderate. 3. Aneurysm of the ascending aorta, measuring 44 mm.  Labs   Lab Results  Component Value Date   CHOL 157 01/22/2021   HDL 57 01/22/2021   LDLCALC 89 01/22/2021   TRIG 52 01/22/2021   CHOLHDL 3.2 08/12/2018   No results found for: LIPOA  Recent Labs    04/06/24 1631  NA 142  K 4.2  CL 106  CO2 24  GLUCOSE 93  BUN 19  CREATININE 1.01*  CALCIUM  8.7*  GFRNONAA 54*    Lab Results  Component Value Date   ALT 3 12/20/2022   AST 10 12/20/2022   ALKPHOS 100 12/20/2022   BILITOT 0.7 12/20/2022      Latest Ref Rng & Units 04/06/2024    4:31 PM 03/06/2023   11:41 AM 12/20/2022    1:55 PM  CBC  WBC 4.0 - 10.5 K/uL 4.3  3.5  4.4  Hemoglobin 12.0 - 15.0 g/dL 87.4  88.2  87.2   Hematocrit 36.0 - 46.0 % 38.5  35.9  37.8   Platelets 150 - 400 K/uL 178  126  166.0    Lab Results  Component Value Date   HGBA1C 5.2 08/11/2018    Lab Results  Component Value Date   TSH 2.33 04/29/2022     ROS  Review of Systems  Cardiovascular:  Negative for  chest pain, dyspnea on exertion and leg swelling.  Neurological:  Positive for dizziness.   Physical Exam:   VS:  BP (!) 140/62 (BP Location: Right Arm, Patient Position: Sitting)   Pulse 75   Ht 5' 5 (1.651 m)   Wt 161 lb 8 oz (73.3 kg)   SpO2 96%   BMI 26.88 kg/m    Wt Readings from Last 3 Encounters:  07/08/24 161 lb 8 oz (73.3 kg)  06/17/24 159 lb (72.1 kg)  05/11/24 161 lb 12.8 oz (73.4 kg)    BP Readings from Last 3 Encounters:  07/08/24 (!) 140/62  06/17/24 110/68  05/11/24 100/68   Physical Exam Neck:     Vascular: No carotid bruit or JVD.  Cardiovascular:     Rate and Rhythm: Normal rate and regular rhythm.     Pulses: Intact distal pulses.     Heart sounds: Normal heart sounds. No murmur heard.    No gallop.  Pulmonary:     Effort: Pulmonary effort is normal.     Breath sounds: Normal breath sounds.  Abdominal:     General: Bowel sounds are normal.     Palpations: Abdomen is soft.  Musculoskeletal:     Right lower leg: No edema.     Left lower leg: No edema.    EKG:         ASSESSMENT AND PLAN: .      ICD-10-CM   1. Atrial tachycardia  I47.19 propranolol  (INDERAL ) 40 MG tablet    2. Orthostatic hypotension  I95.1     3. Moderate aortic valve regurgitation  I35.1     4. Aneurysm of ascending aorta without rupture Stabe at 4.5 cm since 2017  I71.21      Assessment & Plan Orthostatic hypotension Severe orthostatic hypotension with fluctuating blood pressure, exacerbated by propranolol , which has been discontinued. Midodrine  prescribed but blood pressure remains variable. - Educated on midodrine  use, including taking an extra dose an hour before activities that may cause symptoms. - Advised to prioritize managing low blood pressure over high blood pressure due to symptom severity.  Aneurysm of ascending aorta, stable Ascending aortic aneurysm measuring 4.4 cm, stable since 2017 with no significant changes. - Canceled current CT scan order. -  Continue monitoring every two to three years unless severe chest pain occurs, in which case seek emergency care.  Atrial tachycardia Episodes of atrial tachycardia with heart racing symptoms. Propranolol  previously discontinued but can be used as needed for symptomatic relief. - Prescribed propranolol  40 mg, to be taken as needed for palpitations, up to three times a day. - Educated on recognizing symptoms and appropriate use of propranolol .  Moderate aortic valve regurgitation No evidence of heart failure or murmur. - Continue monitoring as part of routine cardiovascular care.  Follow up: 1 Year for atrial tachycardia, aortic aneuryxsm Signed,  Gordy Bergamo, MD, [facility] 07/08/2024, 6:03 PM [facility] [address] [place], [place] [address] Phone: [phone]. Fax:  [phone]

## 2024-07-08 ENCOUNTER — Ambulatory Visit: Attending: Cardiology | Admitting: Cardiology

## 2024-07-08 ENCOUNTER — Encounter (HOSPITAL_COMMUNITY): Payer: Self-pay | Admitting: Internal Medicine

## 2024-07-08 ENCOUNTER — Other Ambulatory Visit (HOSPITAL_COMMUNITY): Payer: Self-pay

## 2024-07-08 ENCOUNTER — Encounter: Payer: Self-pay | Admitting: Cardiology

## 2024-07-08 VITALS — BP 140/62 | HR 75 | Ht 65.0 in | Wt 161.5 lb

## 2024-07-08 DIAGNOSIS — I951 Orthostatic hypotension: Secondary | ICD-10-CM | POA: Diagnosis not present

## 2024-07-08 DIAGNOSIS — I4719 Other supraventricular tachycardia: Secondary | ICD-10-CM | POA: Diagnosis not present

## 2024-07-08 DIAGNOSIS — I351 Nonrheumatic aortic (valve) insufficiency: Secondary | ICD-10-CM | POA: Diagnosis not present

## 2024-07-08 DIAGNOSIS — I7121 Aneurysm of the ascending aorta, without rupture: Secondary | ICD-10-CM

## 2024-07-08 MED ORDER — PROPRANOLOL HCL 40 MG PO TABS
40.0000 mg | ORAL_TABLET | Freq: Three times a day (TID) | ORAL | 1 refills | Status: DC | PRN
  Filled 2024-07-08: qty 30, 10d supply, fill #0

## 2024-07-08 NOTE — Patient Instructions (Signed)
 Medication Instructions:  Your physician recommends that you continue on your [facility] medications as directed. Please refer to the [facility] Medication list given to you today.  *If you need a refill on your cardiac medications before your next appointment, please call your pharmacy*  Lab Work: None.  If you have labs (blood work) drawn today and your tests are completely normal, you will receive your results only by: MyChart Message (if you have MyChart) OR A paper copy in the mail If you have any lab test that is abnormal or we need to change your treatment, we will call you to review the results.  Testing/Procedures: None.  Follow-Up: At [facility], you and your health needs are our priority.  As part of our continuing mission to provide you with exceptional heart care, our providers are all part of one team.  This team includes your primary Cardiologist (physician) and Advanced Practice Providers or APPs (Physician Assistants and Nurse Practitioners) who all work together to provide you with the care you need, when you need it.

## 2024-07-25 ENCOUNTER — Other Ambulatory Visit: Payer: Self-pay | Admitting: Neurology

## 2024-07-25 DIAGNOSIS — G20A1 Parkinson's disease without dyskinesia, without mention of fluctuations: Secondary | ICD-10-CM

## 2024-08-04 ENCOUNTER — Other Ambulatory Visit: Payer: Self-pay | Admitting: Neurology

## 2024-08-04 ENCOUNTER — Ambulatory Visit: Admitting: Nurse Practitioner

## 2024-08-04 DIAGNOSIS — G20A1 Parkinson's disease without dyskinesia, without mention of fluctuations: Secondary | ICD-10-CM

## 2024-08-06 ENCOUNTER — Encounter: Payer: Self-pay | Admitting: Nurse Practitioner

## 2024-08-06 ENCOUNTER — Ambulatory Visit: Admitting: Nurse Practitioner

## 2024-08-06 VITALS — BP 136/83 | HR 89 | Temp 97.4°F | Ht 64.5 in | Wt 160.2 lb

## 2024-08-06 DIAGNOSIS — R000 Tachycardia, unspecified: Secondary | ICD-10-CM | POA: Diagnosis not present

## 2024-08-06 DIAGNOSIS — I2609 Other pulmonary embolism with acute cor pulmonale: Secondary | ICD-10-CM | POA: Diagnosis not present

## 2024-08-06 NOTE — Patient Instructions (Addendum)
 Continue Eliquis  2.5 mg Twice daily . Continue with bleeding precautions and if you fall and hit your head, please seek emergency care.   Follow up with Dr. Margaretann as schedule    Follow up in June with Dr. Geronimo. If symptoms do not improve or worsen, please contact office for sooner follow up or seek emergency care.

## 2024-08-06 NOTE — Assessment & Plan Note (Signed)
 Unprovoked PE hospitalized in June 2023.  She was on full dose Eliquis  for a year, followed by low dose for the last 1.5 years. She will continue until June 2026 and then make shared decision to discontinue vs continue. D-dimer was negative and recent CTA chest without PE.  She will continue current bleeding precautions.   Patient Instructions  Continue Eliquis  2.5 mg Twice daily . Continue with bleeding precautions and if you fall and hit your head, please seek emergency care.   Follow up with Dr. Margaretann as schedule    Follow up in June with Dr. Geronimo. If symptoms do not improve or worsen, please contact office for sooner follow up or seek emergency care.

## 2024-08-06 NOTE — Assessment & Plan Note (Signed)
 Following with cardiology and EP. NSR today. Aware of ED precautions. Follow up with cardiology as scheduled

## 2024-08-06 NOTE — Progress Notes (Signed)
 "  @Patient  ID: Linda Lee, female    DOB: 1936/07/19, 89 y.o.   MRN: [id number]  Chief Complaint  Patient presents with   Medical Management of Chronic Issues    Pt denies any difficulty breathing and states her heart rate has been elevated recently. States she coughs some but denies wheezing.     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 Last seen 01/29/2024.  She was admitted from 01/19/2022 to 01/22/2022 for unprovoked acute submassive pulmonary embolism and associated acute hypoxic respiratory failure.  Past medical history significant for Parkinson's disease, lumbar spinal stenosis, osteoarthritis, AAA without rupture, diastolic CHF, migraines, GERD, HLD.  TEST/EVENTS:  01/19/2022 CTA chest: multiple bilateral pulmonary arterial filling defcts. RV to LV ratio is 1:12. Interval increase in diameter of PA. 4.3 ascending thoracic aortic aneurysm, stable up to 10 years. No LAD. Minimal b/l dependent atelectasis. Interval wedge-shaped area of consolidation in the anteromedial aspect of the RLL with air bronchogram. Interval minimal irregular patchy density more superiorly and laterally in the RLL.  01/20/2022 echocardiogram: EF 60-65%; G1DD. RV size and function nl. Severely elevated PASP. Trivial MR. Moderate TR. AR mild. Mild dilatation of the ascending aorta.  04/01/2022 echocardiogram: EF 60-65%. LVH. RV size and function nl. Unable to measure PASP. Trivial MR. AR mild to moderate.  01/22/2023 echo: EF 60 to 65%.  Diastolic parameters indeterminate.  RV size and function normal.  Prominent RV moderator band.  Unable to measure PASP.  Mild MR.  Mild AR.  No AAS.  Mild dilatation of the ascending aorta, 42 mm. 04/06/2024 CTA chest: atherosclerosis. Heart is mildly enlarged. No PE. Atelectatic change in LLL otherwise clear   01/29/2024: OV with Dr. Geronimo. 2 years of [facility] since PE. Was on full dose  for the first year then low dose for the second year. Tolerating well. D dimer normal. Repeat echo without RV strain. VQ scan suggested some chronic PE. Shared decision based on this to continue another year of low dose Eliquis . Has orthostatic hypotension. Advised she needs to see her heart doctor.   08/06/2024: Today - follow up Discussed the use of AI scribe software for clinical note transcription with the patient, who gave verbal consent to proceed.  History of Present Illness Linda Lee is an 89 year old female who presents for follow up.   Regarding her breathing, things have been stable since her last visit. No issues with shortness of breath, cough, CP. No hemoptysis, calf pain, leg swelling. She has not had any issues with bleeding related to Eliquis . Occasionally gets some mild bruising if she hits her arm on something. No concerns regarding Eliquis  today.   She continues to experience heart rate issues and is under the care of cardiologists, including an electrical cardiologist, for evaluation of this. She is currently maintaining her medication regimen. She's on midodrine  for orthostatic hypotension. She had a CTA chest scan 04/06/2024 without PE.   Over the holidays, she experienced a significant household issue due to a pipe freezing and bursting, causing damage to her kitchen and laundry room floors, which she is getting replaced.     Allergies  Allergen Reactions   Ciprofloxacin  Other (See Comments)    Can not take due to hx of Cdiff per patient   Wasp Venom Other (See Comments)    dizziness   Wasp Venom Protein     Other Reaction(s): Not available  Fluconazole Rash    Immunization History  Administered Date(s) Administered   Fluad Quad(high Dose 65+) 04/20/2019   INFLUENZA, HIGH DOSE SEASONAL PF 05/05/2018   Influenza,inj,quad, With Preservative 05/06/2019   Influenza-Unspecified 05/06/2023   PFIZER(Purple Top)SARS-COV-2 Vaccination 09/10/2019, 10/05/2019,  03/05/2020   Unspecified SARS-COV-2 Vaccination 09/10/2019, 10/05/2019    Past Medical History:  Diagnosis Date   Anginal pain    Angio-edema    Arthritis    Atypical chest pain 07/06/2009   Qualifier: Diagnosis of  By: Verlin, MD, Christopher     GERD (gastroesophageal reflux disease)    Hyperlipemia    Migraines    Toxic effect of venom of bees, unintentional 06/03/2017    Tobacco History: Social History   Tobacco Use  Smoking Status Former   Current packs/day: 0.00   Average packs/day: 0.3 packs/day for 5.0 years (1.3 ttl pk-yrs)   Types: Cigarettes   Start date: 08/14/1958   Quit date: 08/15/1963   Years since quitting: 61.0  Smokeless Tobacco Never   Counseling given: Not Answered   Outpatient Medications Prior to Visit  Medication Sig Dispense Refill   apixaban  (ELIQUIS ) 2.5 MG TABS tablet Take 1 tablet (2.5 mg total) by mouth 2 (two) times daily. 60 tablet 11   atorvastatin  (LIPITOR) 20 MG tablet Take 1 tablet (20 mg total) by mouth daily after supper. 30 tablet 0   carbidopa -levodopa  (SINEMET  CR) 50-200 MG tablet TAKE 1 TABLET BY MOUTH AT BEDTIME 90 tablet 0   carbidopa -levodopa  (SINEMET  IR) 25-100 MG tablet TAKE 1 AND 1/2 TABLETS BY MOUTH THREE TIMES DAILY 405 tablet 0   clobetasol (TEMOVATE) 0.05 % external solution as needed (SKIN IRRITATION).     Cyanocobalamin (VITAMIN B-12 PO) Take 1,200 mcg daily by mouth.      denosumab  (PROLIA ) 60 MG/ML SOSY injection Inject 60 mg into the skin every 6 (six) months.     docusate sodium  (COLACE) 100 MG capsule Take 100 mg by mouth daily as needed for mild constipation.     EPINEPHrine  0.3 mg/0.3 mL IJ SOAJ injection Inject 0.3 mg into the muscle once as needed for up to 1 dose (anaphylaxis). 2 each 1   Ergocalciferol  (VITAMIN D2) 2000 units TABS Take 2,000 Units daily by mouth.      furosemide  (LASIX ) 20 MG tablet Take 20 mg by mouth every Monday, Wednesday, and Friday. (Patient taking differently: Take 20 mg by mouth daily  as needed.)     midodrine  (PROAMATINE ) 2.5 MG tablet Take 2 tabs in AM, 1 tab at lunch and 1 tab at dinner 360 tablet 1   omeprazole  (PRILOSEC ) 40 MG capsule Take 40 mg by mouth continuous as needed (indigestion).     propranolol  ER (INDERAL  LA) 80 MG 24 hr capsule Take 80 mg by mouth daily.     saccharomyces boulardii (FLORASTOR) 250 MG capsule Take 250 mg by mouth 3 (three) times a week.     CRANBERRY PO Take 300 mg daily by mouth. (Patient not taking: Reported on 08/06/2024)     KLOR-CON  M10 10 MEQ tablet Take 10 mEq by mouth 3 (three) times a week. (Patient not taking: Reported on 08/06/2024)     potassium chloride  (KLOR-CON ) 10 MEQ tablet Take by mouth. (Patient not taking: Reported on 08/06/2024)     propranolol  (INDERAL ) 40 MG tablet Take 1 tablet (40 mg total) by mouth 3 (three) times daily as needed (Palpitations). (Patient not taking: Reported on 08/06/2024) 30 tablet 1   silver  sulfADIAZINE  (SILVADENE ) 1 %  cream Apply 1 Application topically daily. (Patient not taking: Reported on 08/06/2024) 50 g 0   No facility-administered medications prior to visit.     Review of Systems: as above    Physical Exam:  BP 136/83   Pulse 89   Temp (!) 97.4 F (36.3 C)   Ht 5' 4.5 (1.638 m) Comment: Per pt  Wt 160 lb 3.2 oz (72.7 kg)   SpO2 97% Comment: RA  BMI 27.07 kg/m   GEN: Pleasant, interactive, well-appearing; in no acute distress. HEENT:  Normocephalic and atraumatic. PERRLA. Sclera white. Nasal turbinates pink, moist and patent bilaterally. No rhinorrhea present. Oropharynx pink and moist, without exudate or edema. No lesions, ulcerations, or postnasal drip.  NECK:  Supple w/ fair ROM. No JVD present.  CV: RRR, no m/r/g, no peripheral edema. Pulses intact, +2 bilaterally. No cyanosis, pallor or clubbing. PULMONARY:  Unlabored, regular breathing. Clear bilaterally A&P w/o wheezes/rales/rhonchi. No accessory muscle use.  GI: BS present and normoactive. Soft, non-tender to palpation.  MSK:  No erythema, warmth or tenderness. No deformities or joint swelling noted.  Neuro: A/Ox3. No focal deficits noted.   Skin: Warm, no lesions or rashe Psych: Normal affect and behavior. Judgement and thought content appropriate.     Lab Results:  CBC    Component Value Date/Time   WBC 4.3 04/06/2024 1631   RBC 3.99 04/06/2024 1631   HGB 12.5 04/06/2024 1631   HCT 38.5 04/06/2024 1631   PLT 178 04/06/2024 1631   MCV 96.5 04/06/2024 1631   MCH 31.3 04/06/2024 1631   MCHC 32.5 04/06/2024 1631   RDW 12.6 04/06/2024 1631   LYMPHSABS 1.1 12/20/2022 1355   MONOABS 0.3 12/20/2022 1355   EOSABS 0.1 12/20/2022 1355   BASOSABS 0.0 12/20/2022 1355    BMET    Component Value Date/Time   NA 142 04/06/2024 1631   K 4.2 04/06/2024 1631   CL 106 04/06/2024 1631   CO2 24 04/06/2024 1631   GLUCOSE 93 04/06/2024 1631   BUN 19 04/06/2024 1631   CREATININE 1.01 (H) 04/06/2024 1631   CALCIUM  8.7 (L) 04/06/2024 1631   GFRNONAA 54 (L) 04/06/2024 1631   GFRAA >60 08/11/2018 1039    BNP    Component Value Date/Time   BNP 108 (H) 12/20/2022 1355     Imaging:  No results found.  Administration History     None           No data to display          No results found for: NITRICOXIDE      Assessment & Plan:   Acute pulmonary embolism (HCC) Unprovoked PE hospitalized in June 2023.  She was on full dose Eliquis  for a year, followed by low dose for the last 1.5 years. She will continue until June 2026 and then make shared decision to discontinue vs continue. D-dimer was negative and recent CTA chest without PE.  She will continue current bleeding precautions.   Patient Instructions  Continue Eliquis  2.5 mg Twice daily . Continue with bleeding precautions and if you fall and hit your head, please seek emergency care.   Follow up with Dr. Margaretann as schedule    Follow up in June with Dr. Geronimo. If symptoms do not improve or worsen, please contact office for sooner  follow up or seek emergency care.    Tachycardia Following with cardiology and EP. NSR today. Aware of ED precautions. Follow up with cardiology as scheduled  I spent 25 minutes of dedicated to the care of this patient on the date of this encounter to include pre-visit review of records, face-to-face time with the patient discussing conditions above, post visit ordering of testing, clinical documentation with the electronic health record, making appropriate referrals as documented, and communicating necessary findings to members of the patients care team.  Comer LULLA Rouleau, NP 08/06/2024  Pt aware and understands NP's role.  "

## 2024-08-15 ENCOUNTER — Other Ambulatory Visit: Payer: Self-pay | Admitting: Neurology

## 2024-08-15 ENCOUNTER — Encounter: Payer: Self-pay | Admitting: Neurology

## 2024-08-15 DIAGNOSIS — G20A1 Parkinson's disease without dyskinesia, without mention of fluctuations: Secondary | ICD-10-CM

## 2024-08-19 NOTE — Progress Notes (Signed)
 "   Assessment/Plan:   1.  Parkinsons Disease  -Continue carbidopa /levodopa  25/100, 1.5 tablets 3 times per day  -continue carbidopa /levodopa  50/200 CR at bed for tremor at nighttime  -We discussed that it used to be thought that levodopa  would increase risk of melanoma but now it is believed that Parkinsons itself likely increases risk of melanoma. she is to get regular skin checks.  2.  Severe spinal canal stenosis at L3-L4  -Has seen Dr. Unice previously and also seen Dr. Carollee.  She asked about who to see now that he has gone, and I told her just to call their office.  She has seen Dr. Darlis and likely needs to call him to see if injection in back will help the foot.  Hasn't had one in long time  3.  History of expressive aphasia, likely representing complicated migraine   -On Lipitor.  Aspirin  was discontinued when she was placed on apixaban  for a pulmonary embolus.  Extensive negative work-up.  4.  Sialorrhea  -This is commonly associated with PD.  We talked about treatments.  The patient is not a candidate for oral anticholinergic therapy because of increased risk of confusion and falls.  We discussed Botox (type A and B) and 1% atropine drops.  We discusssed that candy like lemon drops can help by stimulating mm of the oropharynx to induce swallowing.  She is not interested right now  5.    Pulmonary embolus  -On Eliquis .  6.  Orthostatic hypotension  -not orthostatic today and she was prior to initiation of low dose midodrine   -continue midodrine  2.5 mg tid with meals.    7.  Atrial tachycardia  - cardiology has her on prn propranolol  for atrial tachycardia.  She had significant orthostatic hypotension in the past with more regular use.  8.  Pt moving to [place] so f/u prn.  Can refill meds up to 9 months but will need a movement physician.  Daughter is going to help facilitate there. Subjective:   Linda Lee was seen today in follow up for Parkinsons disease.  My  previous records were reviewed prior to todays visit as well as outside records available to me.  She was in the emergency room early September with tachycardia and dyspnea on exertion.  She noted that her Lasix  had been discontinued because of dropping blood pressure.  Patient was given Lasix  in the emergency room and discharged.  A 4.5 cm stable ascending aortic aneurysm was noted and she was told to follow-up with primary care.  Patient did see cardiology NP a few days later and her blood pressure was only 97/66.  They did orthostatics in the office and noted severe symptomatic orthostatic hypotension with a systolic blood pressure drop of 42 mmHg, but her midodrine  was not changed and Zio patch was ordered.  Patient did call here about 6 days later and noted the orthostatic hypotension.  We offered to work her and, but she ultimately declined stating that her primary care was going to see her, but then she ended up calling back stating that they asked us  to take care of the midodrine .  We did increase that slightly so that she was taking 2 tablets in the morning, 1 in the afternoon and 1 in the evening.  She saw a different cardiology PA at the beginning of October and they restarted her on Lasix  as needed.  She saw the EP physician mid November for atrial tachycardia and they decided to hold  on any medicines.  She saw Dr. Ladona early December and he did give her propranolol  for as needed use for tachycardia.  She isn't taking that.  If her HR goes up, she just sits back down. She has bursitis in the hip and has appt with [facility].  She is moving to [place] soon.  Current prescribed movement disorder medications: Carbidopa /levodopa  25/100, 1.5 tablets 3 times per day carbidopa /levodopa  50/200 cr q hs Midodrine  , 2.5 mg, 2 in the morning, 1 with lunch, 1 with dinner   ALLERGIES:   Allergies  Allergen Reactions   Ciprofloxacin  Other (See Comments)    Can not take due to hx of Cdiff per patient    Wasp Venom Other (See Comments)    dizziness   Wasp Venom Protein     Other Reaction(s): Not available   Fluconazole Rash    CURRENT MEDICATIONS:  Outpatient Encounter Medications as of 08/23/2024  Medication Sig   apixaban  (ELIQUIS ) 2.5 MG TABS tablet Take 1 tablet (2.5 mg total) by mouth 2 (two) times daily.   atorvastatin  (LIPITOR) 20 MG tablet Take 1 tablet (20 mg total) by mouth daily after supper.   carbidopa -levodopa  (SINEMET  CR) 50-200 MG tablet TAKE 1 TABLET BY MOUTH AT BEDTIME   carbidopa -levodopa  (SINEMET  IR) 25-100 MG tablet TAKE 1 AND 1/2 TABLETS BY MOUTH THREE TIMES DAILY   clobetasol (TEMOVATE) 0.05 % external solution as needed (SKIN IRRITATION).   CRANBERRY PO Take 300 mg daily by mouth. (Patient not taking: Reported on 08/06/2024)   Cyanocobalamin (VITAMIN B-12 PO) Take 1,200 mcg daily by mouth.    denosumab  (PROLIA ) 60 MG/ML SOSY injection Inject 60 mg into the skin every 6 (six) months.   docusate sodium  (COLACE) 100 MG capsule Take 100 mg by mouth daily as needed for mild constipation.   EPINEPHrine  0.3 mg/0.3 mL IJ SOAJ injection Inject 0.3 mg into the muscle once as needed for up to 1 dose (anaphylaxis).   Ergocalciferol  (VITAMIN D2) 2000 units TABS Take 2,000 Units daily by mouth.    furosemide  (LASIX ) 20 MG tablet Take 20 mg by mouth every Monday, Wednesday, and Friday. (Patient taking differently: Take 20 mg by mouth daily as needed.)   KLOR-CON  M10 10 MEQ tablet Take 10 mEq by mouth 3 (three) times a week. (Patient not taking: Reported on 08/06/2024)   midodrine  (PROAMATINE ) 2.5 MG tablet Take 2 tabs in AM, 1 tab at lunch and 1 tab at dinner   omeprazole  (PRILOSEC ) 40 MG capsule Take 40 mg by mouth continuous as needed (indigestion).   propranolol  ER (INDERAL  LA) 80 MG 24 hr capsule Take 80 mg by mouth daily.   saccharomyces boulardii (FLORASTOR) 250 MG capsule Take 250 mg by mouth 3 (three) times a week.   [DISCONTINUED] potassium chloride  (KLOR-CON ) 10 MEQ tablet  Take by mouth. (Patient not taking: Reported on 08/06/2024)   [DISCONTINUED] propranolol  (INDERAL ) 40 MG tablet Take 1 tablet (40 mg total) by mouth 3 (three) times daily as needed (Palpitations). (Patient not taking: Reported on 08/06/2024)   [DISCONTINUED] silver  sulfADIAZINE  (SILVADENE ) 1 % cream Apply 1 Application topically daily. (Patient not taking: Reported on 08/06/2024)   No facility-administered encounter medications on file as of 08/23/2024.    Objective:   PHYSICAL EXAMINATION:    VITALS:   Vitals:   08/23/24 0904 08/23/24 0905 08/23/24 0906  SpO2: 99% 99% 99%     Orthostatic VS for the past 72 hrs (Last 3 readings):  Orthostatic BP Patient Position BP Location  Orthostatic Pulse  08/23/24 0906 130/80 Standing Right Arm 93  08/23/24 0905 148/86 -- -- 98  08/23/24 0904 152/90 Supine Right Arm 97   GEN:  The patient appears stated age and is in NAD. HEENT:  Normocephalic, atraumatic.  The mucous membranes are moist. The superficial temporal arteries are without ropiness or tenderness. Cv:  rrr Lungs:  ctab Neck:  no bruits  Neurological examination:  Orientation: The patient is alert and oriented x3. Cranial nerves: There is good facial symmetry without significant facial hypomimia. The speech is fluent and clear. Soft palate rises symmetrically and there is no tongue deviation. Hearing is slight decreased to conversational tone. Sensation: Sensation is intact to light touch throughout.  No extinction with double simultaneous stimulation. Motor: Strength is at least antigravity x 4  Movement examination: Tone: There is mild increased tone in the rue Abnormal movements: there is mild tremor in the RLE/RUE, stable Coordination:  There is no decremation today, with any form of RAMS, including alternating supination and pronation of the forearm, hand opening and closing, finger taps, heel taps and toe taps. Gait and Station: She ambulates with her walker.  She slightly drags  the L leg  I have reviewed and interpreted the following labs independently    Chemistry      Component Value Date/Time   NA 142 04/06/2024 1631   K 4.2 04/06/2024 1631   CL 106 04/06/2024 1631   CO2 24 04/06/2024 1631   BUN 19 04/06/2024 1631   CREATININE 1.01 (H) 04/06/2024 1631      Component Value Date/Time   CALCIUM  8.7 (L) 04/06/2024 1631   ALKPHOS 100 12/20/2022 1355   AST 10 12/20/2022 1355   ALT 3 12/20/2022 1355   BILITOT 0.7 12/20/2022 1355       Lab Results  Component Value Date   WBC 4.3 04/06/2024   HGB 12.5 04/06/2024   HCT 38.5 04/06/2024   MCV 96.5 04/06/2024   PLT 178 04/06/2024   Total time spent on today's visit was 37 minutes, including both face-to-face time and nonface-to-face time.  Time included that spent on review of records (prior notes available to me/labs/imaging if pertinent), discussing treatment and goals, answering patient's questions and coordinating care.   Cc:  Shayne Anes, MD "

## 2024-08-23 ENCOUNTER — Encounter: Payer: Self-pay | Admitting: Neurology

## 2024-08-23 ENCOUNTER — Ambulatory Visit: Admitting: Neurology

## 2024-08-23 VITALS — Ht 66.0 in | Wt 158.0 lb

## 2024-08-23 DIAGNOSIS — G20A1 Parkinson's disease without dyskinesia, without mention of fluctuations: Secondary | ICD-10-CM | POA: Diagnosis not present

## 2024-08-23 MED ORDER — MIDODRINE HCL 2.5 MG PO TABS: ORAL_TABLET | ORAL | 2 refills | Status: AC

## 2024-09-01 ENCOUNTER — Observation Stay (HOSPITAL_COMMUNITY)

## 2024-09-01 ENCOUNTER — Other Ambulatory Visit: Payer: Self-pay

## 2024-09-01 ENCOUNTER — Emergency Department (HOSPITAL_COMMUNITY)

## 2024-09-01 ENCOUNTER — Observation Stay (HOSPITAL_COMMUNITY)
Admission: EM | Admit: 2024-09-01 | Discharge: 2024-09-02 | Disposition: A | Discharge status: Home / Self Care | Attending: Internal Medicine | Admitting: Internal Medicine

## 2024-09-01 DIAGNOSIS — G20C Parkinsonism, unspecified: Secondary | ICD-10-CM | POA: Insufficient documentation

## 2024-09-01 DIAGNOSIS — Z87891 Personal history of nicotine dependence: Secondary | ICD-10-CM | POA: Diagnosis not present

## 2024-09-01 DIAGNOSIS — G9340 Encephalopathy, unspecified: Secondary | ICD-10-CM

## 2024-09-01 DIAGNOSIS — Z8679 Personal history of other diseases of the circulatory system: Secondary | ICD-10-CM | POA: Diagnosis not present

## 2024-09-01 DIAGNOSIS — Z96651 Presence of right artificial knee joint: Secondary | ICD-10-CM | POA: Diagnosis not present

## 2024-09-01 DIAGNOSIS — Z8673 Personal history of transient ischemic attack (TIA), and cerebral infarction without residual deficits: Secondary | ICD-10-CM | POA: Insufficient documentation

## 2024-09-01 DIAGNOSIS — I4719 Other supraventricular tachycardia: Secondary | ICD-10-CM | POA: Diagnosis not present

## 2024-09-01 DIAGNOSIS — G459 Transient cerebral ischemic attack, unspecified: Secondary | ICD-10-CM

## 2024-09-01 DIAGNOSIS — I4891 Unspecified atrial fibrillation: Secondary | ICD-10-CM

## 2024-09-01 DIAGNOSIS — K219 Gastro-esophageal reflux disease without esophagitis: Secondary | ICD-10-CM | POA: Diagnosis not present

## 2024-09-01 DIAGNOSIS — Z7901 Long term (current) use of anticoagulants: Secondary | ICD-10-CM | POA: Diagnosis not present

## 2024-09-01 DIAGNOSIS — Z86711 Personal history of pulmonary embolism: Secondary | ICD-10-CM | POA: Diagnosis not present

## 2024-09-01 DIAGNOSIS — M549 Dorsalgia, unspecified: Secondary | ICD-10-CM

## 2024-09-01 DIAGNOSIS — G43109 Migraine with aura, not intractable, without status migrainosus: Secondary | ICD-10-CM | POA: Diagnosis not present

## 2024-09-01 DIAGNOSIS — R29818 Other symptoms and signs involving the nervous system: Principal | ICD-10-CM | POA: Insufficient documentation

## 2024-09-01 DIAGNOSIS — Z72820 Sleep deprivation: Secondary | ICD-10-CM

## 2024-09-01 DIAGNOSIS — I639 Cerebral infarction, unspecified: Secondary | ICD-10-CM | POA: Diagnosis not present

## 2024-09-01 DIAGNOSIS — I472 Ventricular tachycardia, unspecified: Secondary | ICD-10-CM | POA: Diagnosis not present

## 2024-09-01 DIAGNOSIS — R4701 Aphasia: Principal | ICD-10-CM | POA: Insufficient documentation

## 2024-09-01 DIAGNOSIS — E782 Mixed hyperlipidemia: Secondary | ICD-10-CM | POA: Insufficient documentation

## 2024-09-01 DIAGNOSIS — I959 Hypotension, unspecified: Secondary | ICD-10-CM | POA: Diagnosis not present

## 2024-09-01 DIAGNOSIS — I4729 Other ventricular tachycardia: Secondary | ICD-10-CM | POA: Diagnosis present

## 2024-09-01 DIAGNOSIS — R29702 NIHSS score 2: Secondary | ICD-10-CM | POA: Diagnosis not present

## 2024-09-01 DIAGNOSIS — Z79899 Other long term (current) drug therapy: Secondary | ICD-10-CM | POA: Insufficient documentation

## 2024-09-01 DIAGNOSIS — I5032 Chronic diastolic (congestive) heart failure: Secondary | ICD-10-CM | POA: Insufficient documentation

## 2024-09-01 DIAGNOSIS — I7121 Aneurysm of the ascending aorta, without rupture: Secondary | ICD-10-CM | POA: Insufficient documentation

## 2024-09-01 DIAGNOSIS — G20A1 Parkinson's disease without dyskinesia, without mention of fluctuations: Secondary | ICD-10-CM | POA: Insufficient documentation

## 2024-09-01 LAB — I-STAT CHEM 8, ED
BUN: 21 mg/dL (ref 8–23)
Calcium, Ion: 1.02 mmol/L — ABNORMAL LOW (ref 1.15–1.40)
Chloride: 107 mmol/L (ref 98–111)
Creatinine, Ser: 0.7 mg/dL (ref 0.44–1.00)
Glucose, Bld: 88 mg/dL (ref 70–99)
HCT: 39 % (ref 36.0–46.0)
Hemoglobin: 13.3 g/dL (ref 12.0–15.0)
Potassium: 3.8 mmol/L (ref 3.5–5.1)
Sodium: 143 mmol/L (ref 135–145)
TCO2: 23 mmol/L (ref 22–32)

## 2024-09-01 LAB — DIFFERENTIAL
Abs Immature Granulocytes: 0.01 10*3/uL (ref 0.00–0.07)
Basophils Absolute: 0 10*3/uL (ref 0.0–0.1)
Basophils Relative: 1 %
Eosinophils Absolute: 0 10*3/uL (ref 0.0–0.5)
Eosinophils Relative: 1 %
Immature Granulocytes: 0 %
Lymphocytes Relative: 18 %
Lymphs Abs: 0.8 10*3/uL (ref 0.7–4.0)
Monocytes Absolute: 0.3 10*3/uL (ref 0.1–1.0)
Monocytes Relative: 7 %
Neutro Abs: 3.2 10*3/uL (ref 1.7–7.7)
Neutrophils Relative %: 73 %

## 2024-09-01 LAB — ECHOCARDIOGRAM COMPLETE
Area-P 1/2: 4.21 cm2
Height: 65 in
P 1/2 time: 504 ms
S' Lateral: 3 cm
Weight: 2546.75 [oz_av]

## 2024-09-01 LAB — COMPREHENSIVE METABOLIC PANEL WITH GFR
ALT: <5 U/L (ref 0–44)
AST: 19 U/L (ref 15–41)
Albumin: 3.9 g/dL (ref 3.5–5.0)
Alkaline Phosphatase: 61 U/L (ref 38–126)
Anion gap: 10 (ref 5–15)
BUN: 18 mg/dL (ref 8–23)
CO2: 23 mmol/L (ref 22–32)
Calcium: 8 mg/dL — ABNORMAL LOW (ref 8.9–10.3)
Chloride: 108 mmol/L (ref 98–111)
Creatinine, Ser: 0.72 mg/dL (ref 0.44–1.00)
GFR, Estimated: >60 mL/min
Glucose, Bld: 90 mg/dL (ref 70–99)
Potassium: 3.9 mmol/L (ref 3.5–5.1)
Sodium: 141 mmol/L (ref 135–145)
Total Bilirubin: 0.4 mg/dL (ref 0.0–1.2)
Total Protein: 6.1 g/dL — ABNORMAL LOW (ref 6.5–8.1)

## 2024-09-01 LAB — CBC
HCT: 38.4 % (ref 36.0–46.0)
Hemoglobin: 12.8 g/dL (ref 12.0–15.0)
MCH: 31.6 pg (ref 26.0–34.0)
MCHC: 33.3 g/dL (ref 30.0–36.0)
MCV: 94.8 fL (ref 80.0–100.0)
Platelets: 159 10*3/uL (ref 150–400)
RBC: 4.05 MIL/uL (ref 3.87–5.11)
RDW: 13 % (ref 11.5–15.5)
WBC: 4.3 10*3/uL (ref 4.0–10.5)
nRBC: 0 % (ref 0.0–0.2)

## 2024-09-01 LAB — CBG MONITORING, ED: Glucose-Capillary: 90 mg/dL (ref 70–99)

## 2024-09-01 LAB — APTT: aPTT: 33 s (ref 24–36)

## 2024-09-01 LAB — PROTIME-INR
INR: 1.2 (ref 0.8–1.2)
Prothrombin Time: 15.4 s — ABNORMAL HIGH (ref 11.4–15.2)

## 2024-09-01 LAB — ETHANOL: Alcohol, Ethyl (B): <15 mg/dL

## 2024-09-01 MED ORDER — MIDODRINE HCL 5 MG PO TABS
2.5000 mg | ORAL_TABLET | Freq: Three times a day (TID) | ORAL | Status: DC
  Administered 2024-09-02 (×2): 5 mg via ORAL
  Filled 2024-09-01 (×2): qty 1

## 2024-09-01 MED ORDER — SODIUM CHLORIDE 0.9% FLUSH
3.0000 mL | Freq: Once | INTRAVENOUS | Status: AC
  Administered 2024-09-01: 3 mL via INTRAVENOUS

## 2024-09-01 MED ORDER — ACETAMINOPHEN 325 MG PO TABS
650.0000 mg | ORAL_TABLET | ORAL | Status: DC | PRN
  Administered 2024-09-01: 650 mg via ORAL
  Filled 2024-09-01: qty 2

## 2024-09-01 MED ORDER — CARBIDOPA-LEVODOPA 25-100 MG PO TABS: 1.5000 | ORAL_TABLET | Freq: Three times a day (TID) | ORAL | Status: DC

## 2024-09-01 MED ORDER — ACETAMINOPHEN 325 MG PO TABS: 650.0000 mg | ORAL_TABLET | Freq: Four times a day (QID) | ORAL | Status: DC | PRN

## 2024-09-01 MED ORDER — ATORVASTATIN CALCIUM 10 MG PO TABS
20.0000 mg | ORAL_TABLET | Freq: Every day | ORAL | Status: DC
  Administered 2024-09-01: 20 mg via ORAL
  Filled 2024-09-01: qty 2

## 2024-09-01 MED ORDER — CARBIDOPA-LEVODOPA 25-100 MG PO TABS
1.5000 | ORAL_TABLET | Freq: Three times a day (TID) | ORAL | Status: DC
  Administered 2024-09-01 – 2024-09-02 (×3): 1.5 via ORAL
  Filled 2024-09-01 (×3): qty 2

## 2024-09-01 MED ORDER — ACETAMINOPHEN 650 MG RE SUPP: 650.0000 mg | RECTAL | Status: DC | PRN

## 2024-09-01 MED ORDER — SODIUM CHLORIDE 0.9 % IV SOLN: INTRAVENOUS | Status: DC

## 2024-09-01 MED ORDER — APIXABAN 2.5 MG PO TABS
2.5000 mg | ORAL_TABLET | Freq: Two times a day (BID) | ORAL | Status: DC
  Administered 2024-09-01 – 2024-09-02 (×2): 2.5 mg via ORAL
  Filled 2024-09-01 (×2): qty 1

## 2024-09-01 MED ORDER — ACETAMINOPHEN 325 MG PO TABS
650.0000 mg | ORAL_TABLET | ORAL | Status: DC | PRN
  Administered 2024-09-02: 650 mg via ORAL
  Filled 2024-09-01: qty 2

## 2024-09-01 MED ORDER — PANTOPRAZOLE SODIUM 40 MG PO TBEC
40.0000 mg | DELAYED_RELEASE_TABLET | Freq: Every day | ORAL | Status: DC
  Administered 2024-09-02: 40 mg via ORAL
  Filled 2024-09-01: qty 1

## 2024-09-01 MED ORDER — IOHEXOL 350 MG/ML SOLN
100.0000 mL | Freq: Once | INTRAVENOUS | Status: AC | PRN
  Administered 2024-09-01: 100 mL via INTRAVENOUS

## 2024-09-01 MED ORDER — CARBIDOPA-LEVODOPA ER 50-200 MG PO TBCR
1.0000 | EXTENDED_RELEASE_TABLET | Freq: Every day | ORAL | Status: DC
  Administered 2024-09-01: 1 via ORAL
  Filled 2024-09-01 (×2): qty 1

## 2024-09-01 MED ORDER — SENNOSIDES-DOCUSATE SODIUM 8.6-50 MG PO TABS: 1.0000 | ORAL_TABLET | Freq: Every evening | ORAL | Status: DC | PRN

## 2024-09-01 MED ORDER — STROKE: EARLY STAGES OF RECOVERY BOOK
Freq: Once | Status: AC
  Filled 2024-09-01: qty 1

## 2024-09-01 MED ORDER — ACETAMINOPHEN 160 MG/5ML PO SOLN: 650.0000 mg | ORAL | Status: DC | PRN

## 2024-09-01 NOTE — Code Documentation (Signed)
 Stroke Response Nurse Documentation Code Documentation  Linda Lee is a 89 y.o. female arriving to [facility]  via [facility] on 1/28 with past medical hx of HLD, AAA, CHF, Parkinson disease, TIA, Migraine, PE. On Eliquis  (apixaban ) daily. Code stroke was activated by EMS.   Patient from home where she was LKW at 2100 yesterday and now complaining of intermittent aphasia and R facial droop.    Stroke team at the bedside on patient arrival. Labs drawn and patient cleared for CT by Dr. Laurice. Patient to CT with team. NIHSS 2, see documentation for details and code stroke times. Patient with right facial droop and Expressive aphasia  on exam. The following imaging was completed:  CT Head, CTA, and CTP. Patient is not a candidate for IV Thrombolytic due to LKW 2100 yesterday and on Eliquis . Patient is not a candidate for IR due to no LVO.   Care Plan: q2 NIHSS and vitals x 12 hours, then q4. NPO until swallow screen, MRI.   Bedside handoff with ED RN Andriette.    Lauraine LITTIE Searle  Stroke Response RN

## 2024-09-01 NOTE — ED Provider Notes (Signed)
 " [place] EMERGENCY DEPARTMENT AT [facility] Provider Note   CSN: [id number] Arrival date & time: 09/01/24  1340  An emergency department physician performed an initial assessment on this suspected stroke patient at 1340.  Patient presents with: Code Stroke   Linda Lee is a 89 y.o. female.   89 y.o. 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  Last known well 9 PM last night.   Patient seen by neuroteam on arrival.  Patient taken expeditiously to CT.  Patient is not a candidate for lytics.   The history is provided by the patient and medical records.       Prior to Admission medications  Medication Sig Start Date End Date Taking? Authorizing Provider  apixaban  (ELIQUIS ) 2.5 MG TABS tablet Take 1 tablet (2.5 mg total) by mouth 2 (two) times daily. 01/22/23   Cobb, Comer GAILS, NP  atorvastatin  (LIPITOR) 20 MG tablet Take 1 tablet (20 mg total) by mouth daily after supper. 08/11/18   Mesner, Selinda, MD  carbidopa -levodopa  (SINEMET  CR) 50-200 MG tablet TAKE 1 TABLET BY MOUTH AT BEDTIME 08/16/24   Tat, Asberry RAMAN, DO  carbidopa -levodopa  (SINEMET  IR) 25-100 MG tablet TAKE 1 AND 1/2 TABLETS BY MOUTH THREE TIMES DAILY 08/04/24   Tat, Asberry RAMAN, DO  clobetasol (TEMOVATE) 0.05 % external solution as needed (SKIN IRRITATION). 03/18/24   [provider]  Cyanocobalamin (VITAMIN B-12 PO) Take 1,200 mcg daily by mouth.     [provider]  denosumab  (PROLIA ) 60 MG/ML SOSY injection Inject 60 mg into the skin every 6 (six) months.    [provider]  docusate sodium  (COLACE) 100 MG capsule Take 100 mg by mouth daily as needed for mild constipation.    [provider]  EPINEPHrine  0.3 mg/0.3 mL IJ SOAJ injection Inject 0.3 mg into the muscle once as needed for up  to 1 dose (anaphylaxis). 02/07/21   Cheryl Reusing, FNP  Ergocalciferol  (VITAMIN D2) 2000 units TABS Take 2,000 Units daily by mouth.     [provider]  furosemide  (LASIX ) 20 MG tablet Take 20 mg by mouth every Monday, Wednesday, and Friday. Patient taking differently: Take 20 mg by mouth daily as needed.    [provider]  midodrine  (PROAMATINE ) 2.5 MG tablet Take 2 tabs in AM, 1 tab at lunch and 1 tab at dinner 08/23/24   Tat, Rebecca S, DO  omeprazole  (PRILOSEC ) 40 MG capsule Take 40 mg by mouth continuous as needed (indigestion). 06/15/18   [provider]  propranolol  ER (INDERAL  LA) 80 MG 24 hr capsule Take 80 mg by mouth daily. Patient taking differently: Take 80 mg by mouth as needed. 07/30/24   [provider]  saccharomyces boulardii (FLORASTOR) 250 MG capsule Take 250 mg by mouth 3 (three) times a week.    [provider]    Allergies: Ciprofloxacin , Wasp venom, Wasp venom protein, and Fluconazole    Review of Systems  All other systems reviewed and are negative.   Updated Vital Signs BP (!) 162/86   Pulse 87   Temp 97.7 F (36.5 C)   Resp 16   Ht 5' 5 (1.651 m)   Wt 72.2 kg   SpO2 100%   BMI 26.49 kg/m   Physical Exam Vitals and nursing note reviewed.  Constitutional:  General: She is not in acute distress.    Appearance: Normal appearance. She is well-developed.  HENT:     Head: Normocephalic and atraumatic.     Mouth/Throat:     Mouth: Mucous membranes are moist.  Eyes:     Extraocular Movements: Extraocular movements intact.     Conjunctiva/sclera: Conjunctivae normal.     Pupils: Pupils are equal, round, and reactive to light.  Cardiovascular:     Rate and Rhythm: Normal rate and regular rhythm.     Heart sounds: Normal heart sounds.  Pulmonary:     Effort: Pulmonary effort is normal. No respiratory distress.     Breath sounds: Normal breath sounds.  Abdominal:     General: Abdomen is flat. There is no  distension.     Palpations: Abdomen is soft.     Tenderness: There is no abdominal tenderness.  Musculoskeletal:        General: No deformity. Normal range of motion.     Cervical back: Normal range of motion and neck supple.  Skin:    General: Skin is warm and dry.  Neurological:     General: No focal deficit present.     Mental Status: She is alert and oriented to person, place, and time. Mental status is at baseline.     Cranial Nerves: No cranial nerve deficit.     Sensory: No sensory deficit.     Motor: No weakness.     (all labs ordered are listed, but only abnormal results are displayed) Labs Reviewed  PROTIME-INR - Abnormal; Notable for the following components:      Result Value   Prothrombin Time 15.4 (*)    All other components within normal limits  COMPREHENSIVE METABOLIC PANEL WITH GFR - Abnormal; Notable for the following components:   Calcium  8.0 (*)    Total Protein 6.1 (*)    All other components within normal limits  I-STAT CHEM 8, ED - Abnormal; Notable for the following components:   Calcium , Ion 1.02 (*)    All other components within normal limits  APTT  CBC  DIFFERENTIAL  ETHANOL  CBG MONITORING, ED    EKG: None  Radiology: CT ANGIO HEAD NECK W WO CM W PERF (CODE STROKE) Result Date: 09/01/2024 EXAM: CTA Head and Neck with Perfusion 09/01/2024 02:03:00 PM TECHNIQUE: CTA of the head and neck was performed with and without the administration of 100 mL of iohexol  (OMNIPAQUE ) 350 MG/ML injection. 3D postprocessing with multiplanar reconstructions and MIPs was performed to evaluate the vascular anatomy. Cerebral perfusion analysis using computed tomography with contrast administration, including post-processing of parametric maps with determination of cerebral blood flow, cerebral blood volume, mean transit time and time-to-maximum. Automated exposure control, iterative reconstruction, and/or weight based adjustment of the mA/kV was utilized to reduce the  radiation dose to as low as reasonably achievable. COMPARISON: CT head without contrast 09/01/2024 and CT angio head and neck 03/06/2023. CLINICAL HISTORY: Neuro deficit, acute, stroke suspected. FINDINGS: CTA NECK: AORTIC ARCH AND ARCH VESSELS: Atherosclerotic calcifications are present in the distal aortic arch and proximal left subclavian artery without significant stenosis or change. No dissection or arterial injury. No significant stenosis of the brachiocephalic artery. CERVICAL CAROTID ARTERIES: Tortuosity is present in the proximal right internal carotid artery with a 50% stenosis. The left internal carotid artery is patent without significant stenosis or dissection. No arterial injury. CERVICAL VERTEBRAL ARTERIES: The vertebral arteries are codominant. No dissection, arterial injury, or significant stenosis. LUNGS AND MEDIASTINUM: Unremarkable.  SOFT TISSUES: No acute abnormality. BONES: No acute abnormality. CTA HEAD: ANTERIOR CIRCULATION: No significant stenosis of the intracranial internal carotid arteries. No significant stenosis of the anterior cerebral arteries. Moderate stenosis is present in the superior right M2 segment without significant attenuation of distal vessels. The left middle cerebral artery is patent proximally without significant stenosis. No aneurysm. POSTERIOR CIRCULATION: No significant stenosis of the posterior cerebral arteries. No significant stenosis of the basilar artery. No significant stenosis of the intracranial vertebral arteries. No aneurysm. OTHER: No dural venous sinus thrombosis on this non-dedicated study. CT PERFUSION: EXAM QUALITY: Exam quality is adequate with diagnostic perfusion maps. No significant motion artifact. Appropriate arterial inflow and venous outflow curves. CORE INFARCT (CBF<30% volume): 0 mL TOTAL HYPOPERFUSION (Tmax>6s volume): 0 mL PENUMBRA: Mismatch volume: 0 mL Mismatch ratio: not applicable Location: not applicable IMPRESSION: 1. No acute large  vessel occlusion. 2. No evidence of ischemia by CT brain perfusion (CBF 0 mL, Tmax 0 mL, mismatch 0 mL). 3. Tortuosity in the proximal right internal carotid artery with 50% stenosis. 4. Moderate stenosis in the superior right M2 segment without significant attenuation of distal vessels. Electronically signed by: Lonni Necessary MD 09/01/2024 02:25 PM EST RP Workstation: [id number]   CT HEAD CODE STROKE WO CONTRAST Result Date: 09/01/2024 EXAM: CT HEAD WITHOUT CONTRAST 09/01/2024 01:49:00 PM TECHNIQUE: CT of the head was performed without the administration of intravenous contrast. Automated exposure control, iterative reconstruction, and/or weight based adjustment of the mA/kV was utilized to reduce the radiation dose to as low as reasonably achievable. COMPARISON: CTA head and neck 03/06/23 CLINICAL HISTORY: Neuro deficit, acute, STROKE suspected. FINDINGS: BRAIN AND VENTRICLES: Streak artifact from hearing aids obscures portions of the left greater than right temporal lobes and posterior fossa. Within this limitation, no acute large territory infarct, intracranial hemorrhage, mass, midline shift, hydrocephalus, or extra-axial fluid collection is identified. Mild cerebral atrophy is within normal limits for age. Hypodensities in the cerebral white matter bilaterally are similar to the prior study and nonspecific but compatible with mild chronic small vessel ischemic disease. Calcified atherosclerosis at the skull base. ORBITS: Bilateral cataract extraction. SINUSES: Partially visualized chronic left maxillary sinusitis. Clear mastoid air cells. SOFT TISSUES AND SKULL: No acute soft tissue abnormality. No skull fracture. ALBERTA STROKE PROGRAM EARLY CT SCORE (ASPECTS) ----- Ganglionic (caudate, IC, lentiform nucleus, insula, M1-M3): 7 Supraganglionic (M4-M6): 3 Total: 10 IMPRESSION: 1. No acute intracranial abnormality. ASPECTS of 10. 2. Mild chronic small vessel ischemic disease. 3. These results were  communicated to Dr. DOROTHA Cummins at 1:57pm on 09/01/2024 by secure text page via the [facility] messaging system. Electronically signed by: Dasie Hamburg MD 09/01/2024 02:00 PM EST RP Workstation: [id number]     Procedures   Medications Ordered in the ED  acetaminophen  (TYLENOL ) tablet 650 mg (has no administration in time range)  sodium chloride  flush (NS) 0.9 % injection 3 mL (3 mLs Intravenous Given 09/01/24 1413)  iohexol  (OMNIPAQUE ) 350 MG/ML injection 100 mL (100 mLs Intravenous Contrast Given 09/01/24 1353)                                    Medical Decision Making Patient presents with aphasia.  Symptoms noted at 9 PM last night.  Neurology team has evaluated the patient on arrival.  Patient is not a candidate for lytics.  Neurology has recommended admission for further workup of possible TIA versus migraine versus CVA.  Hospitalist service  made aware of case.  Amount and/or Complexity of Data Reviewed Labs: ordered. Radiology: ordered.  Risk Decision regarding hospitalization.        Final diagnoses:  Aphasia    ED Discharge Orders     None          Laurice Maude BROCKS, MD 09/03/24 1418  "

## 2024-09-01 NOTE — ED Notes (Signed)
 When you have time, Marthann MD (mother) [phone] would like an update on pt. Status. Thank you

## 2024-09-01 NOTE — ED Notes (Addendum)
 Patient transported to MRI

## 2024-09-01 NOTE — ED Notes (Signed)
 Pt ambulated to bathroom with no assistance.

## 2024-09-01 NOTE — Consult Note (Addendum)
 Stroke Neurology Consultation Note  Consult Requested by: Dr. Laurice  Reason for Consult: code stroke  Consult Date: 09/01/24   The history was obtained from the pt and EMS.  During history and examination, all items were able to obtain unless otherwise noted.  History of Present Illness:  Linda Lee is a 89 y.o. 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   Per EMS, pt was writing a check at 10:30am and she was not able to do it as usual. She stated that she wrote 3 times with either a wrong amount or bad handwriting. Around 11am, she called her son and son found she had some difficulty with speaking. EMS was called, on arrival. Pt was found to have some right facial droop but speech gradually improved en route. In ED, pt has occasional hesitancy of speech and paraphasic errors, but language largely intact, not sure if R nasolabial fold flattening is chronic or her baseline appearance. Denies any weakness, numbness. Stated that she did not get sleep last night due to ongoing back pain for the last 2 weeks and worsened last night. CT and CTA unremarkable. Pt took eliquis  this morning.   Per Dr. Evonnie, pt had hx of expressive aphasia likely representing complicated migraine. Pt stated that she did have some episodes in the past with aphasia and mild HA but usually lasting short like minutes but this time seems lasted a couple of  hours and still on and off. Last time she was definitely normal on speech was 9pm last night. After CT, pt stated that she started to have more HA on the b/l frontotemporal regions, dull pain.   She has PD on sinemt following with Dr. Evonnie at [facility]. She also has severe spinal canal stenosis, and she still complaining bad back pain for 2 weeks now.    LSN: 9pm TNK Given: No: outside  window, on eliquis , likely not stroke IR Thrombectomy? No, no LVO Modified Rankin Scale: 2-Slight disability-UNABLE to perform all activities but does not need assistance  Past Medical History:  Diagnosis Date   Anginal pain    Angio-edema    Arthritis    Atypical chest pain 07/06/2009   Qualifier: Diagnosis of  By: Verlin, MD, Christopher     GERD (gastroesophageal reflux disease)    Hyperlipemia    Migraines    Toxic effect of venom of bees, unintentional 06/03/2017    Past Surgical History:  Procedure Laterality Date   ADENOIDECTOMY     BACK SURGERY     HEMORRHOID SURGERY     RIGHT/LEFT HEART CATH AND CORONARY ANGIOGRAPHY N/A 06/17/2017   Procedure: RIGHT/LEFT HEART CATH AND CORONARY ANGIOGRAPHY;  Surgeon: Ladona Heinz, MD;  Location: [facility];  Service: Cardiovascular;  Laterality: N/A;   Thoracicc Aortic Derryl 07/2009     denies surgery for this   TONSILLECTOMY     TOTAL KNEE ARTHROPLASTY Right 09/25/2015   Procedure: TOTAL KNEE ARTHROPLASTY;  Surgeon: Dempsey Moan, MD;  Location: [facility];  Service: Orthopedics;  Laterality: Right;    Family History  Problem Relation Age of Onset   Prostate cancer Father    Healthy Daughter    Epilepsy Son    Healthy Son    Allergic rhinitis Neg Hx    Angioedema Neg Hx    Asthma Neg Hx  Eczema Neg Hx    Immunodeficiency Neg Hx    Urticaria Neg Hx     Social History:  reports that she quit smoking about 61 years ago. Her smoking use included cigarettes. She started smoking about 66 years ago. She has a 1.3 pack-year smoking history. She has never used smokeless tobacco. She reports current alcohol  use of about 1.0 standard drink of alcohol  per week. She reports that she does not use drugs.  Allergies: Allergies[1]  Medications Ordered Prior to Encounter[2]  Review of Systems: A full ROS was attempted today and was able to be performed.  Systems assessed include - Constitutional, Eyes, HENT, Respiratory,  Cardiovascular, Gastrointestinal, Genitourinary, Integument/breast, Hematologic/lymphatic, Musculoskeletal, Neurological, Behavioral/Psych, Endocrine, Allergic/Immunologic - with pertinent responses as per HPI.  Physical Examination: Temp:  [97.7 F (36.5 C)] 97.7 F (36.5 C) (01/28 1343) Pulse Rate:  [87] 87 (01/28 1343) Resp:  [16] 16 (01/28 1343) BP: (162)/(86) 162/86 (01/28 1343) SpO2:  [100 %] 100 % (01/28 1343) Weight:  [72.2 kg] 72.2 kg (01/28 1300)  General - well nourished, well developed, in no apparent distress.    Ophthalmologic - fundi not visualized due to noncooperation.    Cardiovascular - regular rhythm and rate  Neuro - awake, alert, eyes open, orientated to age, place, time. No aphasia, fluent language, following all simple commands. Able to name and repeat. Occasional paraphasic errors and hesitancy of speech. No gaze palsy, tracking bilaterally, visual field full. R mild nasolabial fold flattening, ? Chronic or baseline. Tongue midline. Bilateral UEs 5/5, no drift. Bilaterally LEs 5/5, no drift. Sensation symmetrical bilaterally, b/l FTN intact, gait not tested. Resting tremor more at R hand and R leg.   NIHSS = 2 (speech and facial droop)  Data Reviewed: CT ANGIO HEAD NECK W WO CM W PERF (CODE STROKE) Result Date: 09/01/2024 EXAM: CTA Head and Neck with Perfusion 09/01/2024 02:03:00 PM TECHNIQUE: CTA of the head and neck was performed with and without the administration of 100 mL of iohexol  (OMNIPAQUE ) 350 MG/ML injection. 3D postprocessing with multiplanar reconstructions and MIPs was performed to evaluate the vascular anatomy. Cerebral perfusion analysis using computed tomography with contrast administration, including post-processing of parametric maps with determination of cerebral blood flow, cerebral blood volume, mean transit time and time-to-maximum. Automated exposure control, iterative reconstruction, and/or weight based adjustment of the mA/kV was utilized to  reduce the radiation dose to as low as reasonably achievable. COMPARISON: CT head without contrast 09/01/2024 and CT angio head and neck 03/06/2023. CLINICAL HISTORY: Neuro deficit, acute, stroke suspected. FINDINGS: CTA NECK: AORTIC ARCH AND ARCH VESSELS: Atherosclerotic calcifications are present in the distal aortic arch and proximal left subclavian artery without significant stenosis or change. No dissection or arterial injury. No significant stenosis of the brachiocephalic artery. CERVICAL CAROTID ARTERIES: Tortuosity is present in the proximal right internal carotid artery with a 50% stenosis. The left internal carotid artery is patent without significant stenosis or dissection. No arterial injury. CERVICAL VERTEBRAL ARTERIES: The vertebral arteries are codominant. No dissection, arterial injury, or significant stenosis. LUNGS AND MEDIASTINUM: Unremarkable. SOFT TISSUES: No acute abnormality. BONES: No acute abnormality. CTA HEAD: ANTERIOR CIRCULATION: No significant stenosis of the intracranial internal carotid arteries. No significant stenosis of the anterior cerebral arteries. Moderate stenosis is present in the superior right M2 segment without significant attenuation of distal vessels. The left middle cerebral artery is patent proximally without significant stenosis. No aneurysm. POSTERIOR CIRCULATION: No significant stenosis of the posterior cerebral arteries. No significant stenosis of the  basilar artery. No significant stenosis of the intracranial vertebral arteries. No aneurysm. OTHER: No dural venous sinus thrombosis on this non-dedicated study. CT PERFUSION: EXAM QUALITY: Exam quality is adequate with diagnostic perfusion maps. No significant motion artifact. Appropriate arterial inflow and venous outflow curves. CORE INFARCT (CBF<30% volume): 0 mL TOTAL HYPOPERFUSION (Tmax>6s volume): 0 mL PENUMBRA: Mismatch volume: 0 mL Mismatch ratio: not applicable Location: not applicable IMPRESSION: 1. No  acute large vessel occlusion. 2. No evidence of ischemia by CT brain perfusion (CBF 0 mL, Tmax 0 mL, mismatch 0 mL). 3. Tortuosity in the proximal right internal carotid artery with 50% stenosis. 4. Moderate stenosis in the superior right M2 segment without significant attenuation of distal vessels. Electronically signed by: Lonni Necessary MD 09/01/2024 02:25 PM EST RP Workstation: [id number]   CT HEAD CODE STROKE WO CONTRAST Result Date: 09/01/2024 EXAM: CT HEAD WITHOUT CONTRAST 09/01/2024 01:49:00 PM TECHNIQUE: CT of the head was performed without the administration of intravenous contrast. Automated exposure control, iterative reconstruction, and/or weight based adjustment of the mA/kV was utilized to reduce the radiation dose to as low as reasonably achievable. COMPARISON: CTA head and neck 03/06/23 CLINICAL HISTORY: Neuro deficit, acute, STROKE suspected. FINDINGS: BRAIN AND VENTRICLES: Streak artifact from hearing aids obscures portions of the left greater than right temporal lobes and posterior fossa. Within this limitation, no acute large territory infarct, intracranial hemorrhage, mass, midline shift, hydrocephalus, or extra-axial fluid collection is identified. Mild cerebral atrophy is within normal limits for age. Hypodensities in the cerebral white matter bilaterally are similar to the prior study and nonspecific but compatible with mild chronic small vessel ischemic disease. Calcified atherosclerosis at the skull base. ORBITS: Bilateral cataract extraction. SINUSES: Partially visualized chronic left maxillary sinusitis. Clear mastoid air cells. SOFT TISSUES AND SKULL: No acute soft tissue abnormality. No skull fracture. ALBERTA STROKE PROGRAM EARLY CT SCORE (ASPECTS) ----- Ganglionic (caudate, IC, lentiform nucleus, insula, M1-M3): 7 Supraganglionic (M4-M6): 3 Total: 10 IMPRESSION: 1. No acute intracranial abnormality. ASPECTS of 10. 2. Mild chronic small vessel ischemic disease. 3. These  results were communicated to Dr. DOROTHA Cummins at 1:57pm on 09/01/2024 by secure text page via the [facility] messaging system. Electronically signed by: Dasie Hamburg MD 09/01/2024 02:00 PM EST RP Workstation: [id number]    Assessment: 89 y.o. 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 Also found right nasolabial fold flattening not sure if baseline. LSW 9pm, on eliquis  at home took last dose this morning. NIHSS = 2. Stated that she did not get sleep last night due to ongoing back pain for the last 2 weeks and worsened last night. CT and CTA unremarkable. Per note, pt had hx of expressive aphasia likely representing complicated migraine. Pt stated that she did have some episodes in the past with aphasia and mild HA but usually lasting short like minutes but this time seems lasted a couple of  hours and still on and off. After CT, pt stated that she started to have more HA on the b/l frontotemporal regions, dull pain. Not TPA candidate as she is outside window, on eliquis , likely not stroke. No IR as no LVO. Pt symptoms could be complicated migraine or encephalopathy from sleep deprivation and back pain, but can not rule out TIA or stroke given she has atrial tachycardia with ? Afib (Dr. Ladona last note mentioned afib but not in other  notes) only on 2.5mg  eliquis . If she does have PAF, she needs to be on eliquis  5mg .   Plan: Continue further stroke work up  Frequent neuro checks Telemetry monitoring MRI brain  EEG to rule out seizure but low suspicious Echocardiogram  Fasting lipid panel and HgbA1C PT/OT/speech consult Permissive hypertension (only treat if BP > 220/120 unless a lower blood pressure is clinically necessary) for 24-48 hours post stroke onset GI and DVT prophylaxis  Had eliquis  2.5mg  this morning. If MRI no large  infarct, may resume eliquis . However, given her hx of atrial tachycardia with ? Afib (Dr. Ladona last note mentioned afib but not in other notes), may consider 5mg  bid. Continue sinemt and follow up with Dr. Evonnie as outpt Headache and LBP management per EDP. Discussed with Dr. Laurice ED physician Stroke team will follow    Thank you for this consultation and allowing us  to participate in the care of this patient.  Ary Cummins, MD PhD Stroke Neurology 09/01/2024 2:56 PM      [1]  Allergies Allergen Reactions   Ciprofloxacin  Other (See Comments)    Can not take due to hx of Cdiff per patient   Wasp Venom Other (See Comments)    dizziness   Wasp Venom Protein     Other Reaction(s): Not available   Fluconazole Rash  [2]  No current facility-administered medications on file prior to encounter.   Current Outpatient Medications on File Prior to Encounter  Medication Sig Dispense Refill   apixaban  (ELIQUIS ) 2.5 MG TABS tablet Take 1 tablet (2.5 mg total) by mouth 2 (two) times daily. 60 tablet 11   atorvastatin  (LIPITOR) 20 MG tablet Take 1 tablet (20 mg total) by mouth daily after supper. 30 tablet 0   carbidopa -levodopa  (SINEMET  CR) 50-200 MG tablet TAKE 1 TABLET BY MOUTH AT BEDTIME 90 tablet 0   carbidopa -levodopa  (SINEMET  IR) 25-100 MG tablet TAKE 1 AND 1/2 TABLETS BY MOUTH THREE TIMES DAILY 405 tablet 0   clobetasol (TEMOVATE) 0.05 % external solution as needed (SKIN IRRITATION).     Cyanocobalamin (VITAMIN B-12 PO) Take 1,200 mcg daily by mouth.      denosumab  (PROLIA ) 60 MG/ML SOSY injection Inject 60 mg into the skin every 6 (six) months.     docusate sodium  (COLACE) 100 MG capsule Take 100 mg by mouth daily as needed for mild constipation.     EPINEPHrine  0.3 mg/0.3 mL IJ SOAJ injection Inject 0.3 mg into the muscle once as needed for up to 1 dose (anaphylaxis). 2 each 1   Ergocalciferol  (VITAMIN D2) 2000 units TABS Take 2,000 Units daily by mouth.      furosemide  (LASIX ) 20  MG tablet Take 20 mg by mouth every Monday, Wednesday, and Friday. (Patient taking differently: Take 20 mg by mouth daily as needed.)     midodrine  (PROAMATINE ) 2.5 MG tablet Take 2 tabs in AM, 1 tab at lunch and 1 tab at dinner 360 tablet 2   omeprazole  (PRILOSEC ) 40 MG capsule Take 40 mg by mouth continuous as needed (indigestion).     propranolol  ER (INDERAL  LA) 80 MG 24 hr capsule Take 80 mg by mouth daily. (Patient taking differently: Take 80 mg by mouth as needed.)     saccharomyces boulardii (FLORASTOR) 250 MG capsule Take 250 mg by mouth 3 (three) times a week.

## 2024-09-01 NOTE — H&P (Signed)
 " History and Physical   Linda Lee FMW:[id number] DOB: 08-Jun-1936 DOA: 09/01/2024  PCP: Shayne Anes, MD   Patient coming from: Home  Chief Complaint: Focal neurologic deficits  HPI: Linda Lee is a 89 y.o. female with medical history significant of hyperlipidemia, TIA, GERD, NSVT, Parkinson disease, ascending aortic aneurysm, PE presenting with focal neurologic deficits.  Patient last normal was last night.  Around 10 AM patient notes she was having difficulty writing checks.  Around 8 AM son noticed that she had some aphasia/word finding difficulty on the phone.  Patient's son called neighbor and asked them to check on her and they noticed that she had some facial droop.  Patient then brought to the ED for further evaluation.  Patient reports some neuropathic pain in her left lower extremity.  Patient denies fevers, chills, chest pain, shortness of breath, abdominal pain, constipation, diarrhea, nausea, vomiting.  ED Course: Vital signs in the ED notable for blood pressure in the 160s-170 systolic, respiratory rate in the teens-20s.  Lab workup included CMP with calcium  8.0, protein 6.1.  CBC within normal limits.  PT 15.2, INR, PTT normal.  Ethanol level negative.  CT head showed no acute abnormality.  CTA head and neck showed no evidence of LVO, 50% stenosis of the right carotid artery, moderate stenosis of the right M2.  Patient received Tylenol  in the ED.  Neurology saw patient on arrival as code stroke and are following.  Review of Systems: As per HPI otherwise all other systems reviewed and are negative.  Past Medical History:  Diagnosis Date   Anginal pain    Angio-edema    Arthritis    Atypical chest pain 07/06/2009   Qualifier: Diagnosis of  By: Verlin, MD, Christopher     GERD (gastroesophageal reflux disease)    Hyperlipemia    Migraines    Toxic effect of venom of bees, unintentional 06/03/2017    Past Surgical History:  Procedure Laterality Date    ADENOIDECTOMY     BACK SURGERY     HEMORRHOID SURGERY     RIGHT/LEFT HEART CATH AND CORONARY ANGIOGRAPHY N/A 06/17/2017   Procedure: RIGHT/LEFT HEART CATH AND CORONARY ANGIOGRAPHY;  Surgeon: Ladona Heinz, MD;  Location: [facility];  Service: Cardiovascular;  Laterality: N/A;   Thoracicc Aortic Derryl 07/2009     denies surgery for this   TONSILLECTOMY     TOTAL KNEE ARTHROPLASTY Right 09/25/2015   Procedure: TOTAL KNEE ARTHROPLASTY;  Surgeon: Dempsey Moan, MD;  Location: [facility];  Service: Orthopedics;  Laterality: Right;    Social History  reports that she quit smoking about 61 years ago. Her smoking use included cigarettes. She started smoking about 66 years ago. She has a 1.3 pack-year smoking history. She has never used smokeless tobacco. She reports current alcohol  use of about 1.0 standard drink of alcohol  per week. She reports that she does not use drugs.  Allergies[1]  Family History  Problem Relation Age of Onset   Prostate cancer Father    Healthy Daughter    Epilepsy Son    Healthy Son    Allergic rhinitis Neg Hx    Angioedema Neg Hx    Asthma Neg Hx    Eczema Neg Hx    Immunodeficiency Neg Hx    Urticaria Neg Hx   Reviewed on admission  Prior to Admission medications  Medication Sig Start Date End Date Taking? Authorizing Provider  apixaban  (ELIQUIS ) 2.5 MG TABS tablet Take 1 tablet (2.5 mg  total) by mouth 2 (two) times daily. 01/22/23   Cobb, Comer GAILS, NP  atorvastatin  (LIPITOR) 20 MG tablet Take 1 tablet (20 mg total) by mouth daily after supper. 08/11/18   Mesner, Selinda, MD  carbidopa -levodopa  (SINEMET  CR) 50-200 MG tablet TAKE 1 TABLET BY MOUTH AT BEDTIME 08/16/24   Tat, Asberry RAMAN, DO  carbidopa -levodopa  (SINEMET  IR) 25-100 MG tablet TAKE 1 AND 1/2 TABLETS BY MOUTH THREE TIMES DAILY 08/04/24   Tat, Asberry RAMAN, DO  clobetasol (TEMOVATE) 0.05 % external solution as needed (SKIN IRRITATION). 03/18/24   [provider]  Cyanocobalamin (VITAMIN B-12 PO)  Take 1,200 mcg daily by mouth.     [provider]  denosumab  (PROLIA ) 60 MG/ML SOSY injection Inject 60 mg into the skin every 6 (six) months.    [provider]  docusate sodium  (COLACE) 100 MG capsule Take 100 mg by mouth daily as needed for mild constipation.    [provider]  EPINEPHrine  0.3 mg/0.3 mL IJ SOAJ injection Inject 0.3 mg into the muscle once as needed for up to 1 dose (anaphylaxis). 02/07/21   Cheryl Reusing, FNP  Ergocalciferol  (VITAMIN D2) 2000 units TABS Take 2,000 Units daily by mouth.     [provider]  furosemide  (LASIX ) 20 MG tablet Take 20 mg by mouth every Monday, Wednesday, and Friday. Patient taking differently: Take 20 mg by mouth daily as needed.    [provider]  midodrine  (PROAMATINE ) 2.5 MG tablet Take 2 tabs in AM, 1 tab at lunch and 1 tab at dinner 08/23/24   Tat, Rebecca S, DO  omeprazole  (PRILOSEC ) 40 MG capsule Take 40 mg by mouth continuous as needed (indigestion). 06/15/18   [provider]  propranolol  ER (INDERAL  LA) 80 MG 24 hr capsule Take 80 mg by mouth daily. Patient taking differently: Take 80 mg by mouth as needed. 07/30/24   [provider]  saccharomyces boulardii (FLORASTOR) 250 MG capsule Take 250 mg by mouth 3 (three) times a week.    [provider]    Physical Exam: Vitals:   09/01/24 1530 09/01/24 1545 09/01/24 1600 09/01/24 1615  BP: (!) 151/66 (!) 155/58 (!) 156/87 (!) 143/106  Pulse: 75 71 74 73  Resp: (!) 22 (!) 24 16 20   Temp:      SpO2: 100% 99% 100% 100%  Weight:      Height:        Physical Exam Constitutional:      General: She is not in acute distress.    Appearance: Normal appearance.  HENT:     Head: Normocephalic and atraumatic.     Mouth/Throat:     Mouth: Mucous membranes are moist.     Pharynx: Oropharynx is clear.  Eyes:     Extraocular Movements: Extraocular movements intact.     Pupils: Pupils are equal, round, and reactive to  light.  Cardiovascular:     Rate and Rhythm: Normal rate and regular rhythm.     Pulses: Normal pulses.     Heart sounds: Normal heart sounds.  Pulmonary:     Effort: Pulmonary effort is normal. No respiratory distress.     Breath sounds: Normal breath sounds.  Abdominal:     General: Bowel sounds are normal. There is no distension.     Palpations: Abdomen is soft.     Tenderness: There is no abdominal tenderness.  Musculoskeletal:        General: No swelling or deformity.  Skin:  General: Skin is warm and dry.  Neurological:     Comments: Mental Status: Patient is awake, alert, oriented Some continued word finding difficulty Cranial Nerves: II: Pupils equal, round, and reactive to light.   III,IV, VI: EOMI without ptosis or diploplia.  V: Facial sensation is symmetric to light touch. VII: Facial movement is symmetric.  VIII: hearing is intact to voice X: Uvula elevates symmetrically XI: Shoulder shrug is symmetric. XII: tongue is midline without atrophy or fasciculations.  Motor: Good effort thorughout, at Least 5/5 RUE, 4-5/5 LUE, 5/5 bilateral lower extremitiy  Sensory: Sensation is grossly intact bilateral UEs & LEs    Labs on Admission: I have personally reviewed following labs and imaging studies  CBC: Recent Labs  Lab 09/01/24 1343 09/01/24 1345  WBC 4.3  --   NEUTROABS 3.2  --   HGB 12.8 13.3  HCT 38.4 39.0  MCV 94.8  --   PLT 159  --     Basic Metabolic Panel: Recent Labs  Lab 09/01/24 1343 09/01/24 1345  NA 141 143  K 3.9 3.8  CL 108 107  CO2 23  --   GLUCOSE 90 88  BUN 18 21  CREATININE 0.72 0.70  CALCIUM  8.0*  --     GFR: Estimated Creatinine Clearance: 50.3 mL/min (by C-G formula based on SCr of 0.7 mg/dL).  Liver Function Tests: Recent Labs  Lab 09/01/24 1343  AST 19  ALT <5  ALKPHOS 61  BILITOT 0.4  PROT 6.1*  ALBUMIN  3.9    Urine analysis:    Component Value Date/Time   COLORURINE YELLOW 01/19/2022 1320   APPEARANCEUR  HAZY (A) 01/19/2022 1320   LABSPEC 1.025 01/19/2022 1320   PHURINE 5.5 01/19/2022 1320   GLUCOSEU NEGATIVE 01/19/2022 1320   HGBUR NEGATIVE 01/19/2022 1320   BILIRUBINUR NEGATIVE 01/19/2022 1320   KETONESUR 15 (A) 01/19/2022 1320   PROTEINUR 30 (A) 01/19/2022 1320   UROBILINOGEN 0.2 09/04/2012 0159   NITRITE POSITIVE (A) 01/19/2022 1320   LEUKOCYTESUR SMALL (A) 01/19/2022 1320    Radiological Exams on Admission: CT ANGIO HEAD NECK W WO CM W PERF (CODE STROKE) Result Date: 09/01/2024 EXAM: CTA Head and Neck with Perfusion 09/01/2024 02:03:00 PM TECHNIQUE: CTA of the head and neck was performed with and without the administration of 100 mL of iohexol  (OMNIPAQUE ) 350 MG/ML injection. 3D postprocessing with multiplanar reconstructions and MIPs was performed to evaluate the vascular anatomy. Cerebral perfusion analysis using computed tomography with contrast administration, including post-processing of parametric maps with determination of cerebral blood flow, cerebral blood volume, mean transit time and time-to-maximum. Automated exposure control, iterative reconstruction, and/or weight based adjustment of the mA/kV was utilized to reduce the radiation dose to as low as reasonably achievable. COMPARISON: CT head without contrast 09/01/2024 and CT angio head and neck 03/06/2023. CLINICAL HISTORY: Neuro deficit, acute, stroke suspected. FINDINGS: CTA NECK: AORTIC ARCH AND ARCH VESSELS: Atherosclerotic calcifications are present in the distal aortic arch and proximal left subclavian artery without significant stenosis or change. No dissection or arterial injury. No significant stenosis of the brachiocephalic artery. CERVICAL CAROTID ARTERIES: Tortuosity is present in the proximal right internal carotid artery with a 50% stenosis. The left internal carotid artery is patent without significant stenosis or dissection. No arterial injury. CERVICAL VERTEBRAL ARTERIES: The vertebral arteries are codominant. No  dissection, arterial injury, or significant stenosis. LUNGS AND MEDIASTINUM: Unremarkable. SOFT TISSUES: No acute abnormality. BONES: No acute abnormality. CTA HEAD: ANTERIOR CIRCULATION: No significant stenosis of the intracranial  internal carotid arteries. No significant stenosis of the anterior cerebral arteries. Moderate stenosis is present in the superior right M2 segment without significant attenuation of distal vessels. The left middle cerebral artery is patent proximally without significant stenosis. No aneurysm. POSTERIOR CIRCULATION: No significant stenosis of the posterior cerebral arteries. No significant stenosis of the basilar artery. No significant stenosis of the intracranial vertebral arteries. No aneurysm. OTHER: No dural venous sinus thrombosis on this non-dedicated study. CT PERFUSION: EXAM QUALITY: Exam quality is adequate with diagnostic perfusion maps. No significant motion artifact. Appropriate arterial inflow and venous outflow curves. CORE INFARCT (CBF<30% volume): 0 mL TOTAL HYPOPERFUSION (Tmax>6s volume): 0 mL PENUMBRA: Mismatch volume: 0 mL Mismatch ratio: not applicable Location: not applicable IMPRESSION: 1. No acute large vessel occlusion. 2. No evidence of ischemia by CT brain perfusion (CBF 0 mL, Tmax 0 mL, mismatch 0 mL). 3. Tortuosity in the proximal right internal carotid artery with 50% stenosis. 4. Moderate stenosis in the superior right M2 segment without significant attenuation of distal vessels. Electronically signed by: Lonni Necessary MD 09/01/2024 02:25 PM EST RP Workstation: [id number]   CT HEAD CODE STROKE WO CONTRAST Result Date: 09/01/2024 EXAM: CT HEAD WITHOUT CONTRAST 09/01/2024 01:49:00 PM TECHNIQUE: CT of the head was performed without the administration of intravenous contrast. Automated exposure control, iterative reconstruction, and/or weight based adjustment of the mA/kV was utilized to reduce the radiation dose to as low as reasonably achievable.  COMPARISON: CTA head and neck 03/06/23 CLINICAL HISTORY: Neuro deficit, acute, STROKE suspected. FINDINGS: BRAIN AND VENTRICLES: Streak artifact from hearing aids obscures portions of the left greater than right temporal lobes and posterior fossa. Within this limitation, no acute large territory infarct, intracranial hemorrhage, mass, midline shift, hydrocephalus, or extra-axial fluid collection is identified. Mild cerebral atrophy is within normal limits for age. Hypodensities in the cerebral white matter bilaterally are similar to the prior study and nonspecific but compatible with mild chronic small vessel ischemic disease. Calcified atherosclerosis at the skull base. ORBITS: Bilateral cataract extraction. SINUSES: Partially visualized chronic left maxillary sinusitis. Clear mastoid air cells. SOFT TISSUES AND SKULL: No acute soft tissue abnormality. No skull fracture. ALBERTA STROKE PROGRAM EARLY CT SCORE (ASPECTS) ----- Ganglionic (caudate, IC, lentiform nucleus, insula, M1-M3): 7 Supraganglionic (M4-M6): 3 Total: 10 IMPRESSION: 1. No acute intracranial abnormality. ASPECTS of 10. 2. Mild chronic small vessel ischemic disease. 3. These results were communicated to Dr. DOROTHA Cummins at 1:57pm on 09/01/2024 by secure text page via the [facility] messaging system. Electronically signed by: Dasie Hamburg MD 09/01/2024 02:00 PM EST RP Workstation: [id number]   EKG: Independently reviewed.  Sinus rhythm at 79 beats minute.  Nonspecific T wave changes.  Baseline artifact in multiple leads.  Assessment/Plan Principal Problem:   Focal neurological deficit Active Problems:   Parkinson disease (HCC)   Mixed hyperlipidemia   GERD without esophagitis   Ascending aortic aneurysm (HCC)   Diastolic CHF, chronic (HCC)   History of TIA (transient ischemic attack)   Nonsustained ventricular tachycardia (HCC)   Focal neurologic deficit History of TIA TIA versus CVA versus atypical migraine > Patient presenting with aphasia,  facial droop, difficulty writing checks. > History of prior TIA.  Also history of issues with aphasia which were reportedly related to migraine symptoms. > Symptoms largely resolved in the ED.  Continues to have word finding difficulty however and also noted to have some mild strength deficit in her left upper extremity on my exam. > Neurology saw patient on arrival and continue to follow.  Stroke has begun. - Appreciate neurology recommendations and assistance - Allow for permissive HTN (systolic < 220 and diastolic < 120) - Plan to continue Eliquis  as long as no significant bleeding on MRI brain. - Continue home statin   - Echocardiogram  - MRI brain - A1C  - Lipid panel  - Tele monitoring  - SLP eval - PT/OT - EEG  NSVT - Currently takes propranolol  as needed due to issues with blood pressure  Hypotension - Takes midodrine  3 times a day  Parkinson's disease - Continue carbidopa  levodopa   GERD - Continue PPI  History of PE - Plan to continue Eliquis  unless MRI shows significant bleed  Chronic diastolic CHF > Last echo was April of last year and showed EF 60-65%, grade 1 diastolic dysfunction, normal RV function. - Not currently on diuretic - Repeat echo ordered for  History of ascending aortic aneurysm - Noted  DVT prophylaxis: Eliquis  Code Status:   Full Family Communication:  None on admission Disposition Plan:   Patient is from:  Home  Anticipated DC to:  Home  Anticipated DC date:  1 to 2 days  Anticipated DC barriers: None  Consults called:  Neurology Admission status:  Observation, telemetry  Severity of Illness: The appropriate patient status for this patient is OBSERVATION. Observation status is judged to be reasonable and necessary in order to provide the required intensity of service to ensure the patient's safety. The patient's presenting symptoms, physical exam findings, and initial radiographic and laboratory data in the context of their medical  condition is felt to place them at decreased risk for further clinical deterioration. Furthermore, it is anticipated that the patient will be medically stable for discharge from the hospital within 2 midnights of admission.    Marsa KATHEE Scurry MD [facility]  How to contact the [facility] Attending or Consulting provider 7A - 7P or covering provider during after hours 7P -7A, for this patient?   Check the care team in [facility] and look for a) attending/consulting [facility] provider listed and b) the [facility] team listed Log into [email or web address] and use [place]'s universal password to access. If you do not have the password, please contact the hospital operator. Locate the [facility] provider you are looking for under [facility] and page to a number that you can be directly reached. If you still have difficulty reaching the provider, please page the [facility] (Director on Call) for the Hospitalists listed on amion for assistance.  09/01/2024, 4:36 PM       [1]  Allergies Allergen Reactions   Ciprofloxacin  Other (See Comments)    Can not take due to hx of Cdiff per patient   Wasp Venom Other (See Comments)    dizziness   Fluconazole Rash   "

## 2024-09-01 NOTE — ED Notes (Signed)
 Patient up to restroom independently. Patient ambulated with a steady gait.

## 2024-09-01 NOTE — Progress Notes (Signed)
 EEG complete - results pending

## 2024-09-01 NOTE — ED Notes (Signed)
"  Update provided to patients daughter.   "

## 2024-09-01 NOTE — ED Triage Notes (Signed)
 Pt bib [facility] from home. Around 10am this morning pt noticed she was having diffulculty writing a check. At 11am, pt called son who noticed aphasia. Son called the neighbors to check on pt. Pt noted to have r facial droop. LKW 9pm last night. Takes eliquis 

## 2024-09-01 NOTE — ED Notes (Signed)
 Arrived to room for [facility] assessment, no pt in room. Assessment will resume on pts return.

## 2024-09-01 NOTE — Progress Notes (Signed)
" °  Echocardiogram 2D Echocardiogram has been performed.  Linda Lee 09/01/2024, 5:59 PM "

## 2024-09-01 NOTE — ED Notes (Signed)
 CCMD called for monitoring

## 2024-09-02 ENCOUNTER — Other Ambulatory Visit (HOSPITAL_COMMUNITY): Payer: Self-pay

## 2024-09-02 ENCOUNTER — Encounter (HOSPITAL_COMMUNITY): Payer: Self-pay | Admitting: Internal Medicine

## 2024-09-02 DIAGNOSIS — Z7901 Long term (current) use of anticoagulants: Secondary | ICD-10-CM

## 2024-09-02 DIAGNOSIS — R569 Unspecified convulsions: Secondary | ICD-10-CM

## 2024-09-02 DIAGNOSIS — I739 Peripheral vascular disease, unspecified: Secondary | ICD-10-CM

## 2024-09-02 DIAGNOSIS — G459 Transient cerebral ischemic attack, unspecified: Secondary | ICD-10-CM | POA: Diagnosis not present

## 2024-09-02 DIAGNOSIS — I951 Orthostatic hypotension: Secondary | ICD-10-CM

## 2024-09-02 DIAGNOSIS — E785 Hyperlipidemia, unspecified: Secondary | ICD-10-CM

## 2024-09-02 LAB — CBC
HCT: 38.4 % (ref 36.0–46.0)
Hemoglobin: 12.6 g/dL (ref 12.0–15.0)
MCH: 31 pg (ref 26.0–34.0)
MCHC: 32.8 g/dL (ref 30.0–36.0)
MCV: 94.6 fL (ref 80.0–100.0)
Platelets: 155 10*3/uL (ref 150–400)
RBC: 4.06 MIL/uL (ref 3.87–5.11)
RDW: 13.1 % (ref 11.5–15.5)
WBC: 4.6 10*3/uL (ref 4.0–10.5)
nRBC: 0 % (ref 0.0–0.2)

## 2024-09-02 LAB — COMPREHENSIVE METABOLIC PANEL WITH GFR
ALT: <5 U/L (ref 0–44)
AST: 18 U/L (ref 15–41)
Albumin: 3.9 g/dL (ref 3.5–5.0)
Alkaline Phosphatase: 60 U/L (ref 38–126)
Anion gap: 10 (ref 5–15)
BUN: 16 mg/dL (ref 8–23)
CO2: 23 mmol/L (ref 22–32)
Calcium: 8.4 mg/dL — ABNORMAL LOW (ref 8.9–10.3)
Chloride: 105 mmol/L (ref 98–111)
Creatinine, Ser: 0.65 mg/dL (ref 0.44–1.00)
GFR, Estimated: >60 mL/min
Glucose, Bld: 91 mg/dL (ref 70–99)
Potassium: 3.8 mmol/L (ref 3.5–5.1)
Sodium: 137 mmol/L (ref 135–145)
Total Bilirubin: 0.6 mg/dL (ref 0.0–1.2)
Total Protein: 6.1 g/dL — ABNORMAL LOW (ref 6.5–8.1)

## 2024-09-02 LAB — LIPID PANEL
Cholesterol: 202 mg/dL — ABNORMAL HIGH (ref 0–200)
HDL: 45 mg/dL
LDL Cholesterol: 135 mg/dL — ABNORMAL HIGH (ref 0–99)
Total CHOL/HDL Ratio: 4.5 ratio
Triglycerides: 114 mg/dL
VLDL: 23 mg/dL (ref 0–40)

## 2024-09-02 LAB — HEMOGLOBIN A1C
Hgb A1c MFr Bld: 5.4 % (ref 4.8–5.6)
Mean Plasma Glucose: 108.28 mg/dL

## 2024-09-02 MED ORDER — ATORVASTATIN CALCIUM 40 MG PO TABS
40.0000 mg | ORAL_TABLET | Freq: Every day | ORAL | 0 refills | Status: AC
  Filled 2024-09-02: qty 90, 90d supply, fill #0

## 2024-09-02 MED ORDER — ATORVASTATIN CALCIUM 40 MG PO TABS: 40.0000 mg | ORAL_TABLET | Freq: Every day | ORAL | Status: DC

## 2024-09-02 MED ORDER — APIXABAN 5 MG PO TABS
5.0000 mg | ORAL_TABLET | Freq: Two times a day (BID) | ORAL | 0 refills | Status: AC
  Filled 2024-09-02: qty 60, 30d supply, fill #0

## 2024-09-02 MED ORDER — APIXABAN 5 MG PO TABS: 5.0000 mg | ORAL_TABLET | Freq: Two times a day (BID) | ORAL | Status: DC

## 2024-09-02 NOTE — Progress Notes (Addendum)
 "  [facility] PROGRESS NOTE   Linda Lee FMW:[id number] DOB: [birth year] DOA: 09/01/2024  PCP: Shayne Anes, MD  Brief History: 89 y.o. female with medical history significant of hyperlipidemia, TIA, GERD, NSVT, Parkinson disease, ascending aortic aneurysm, PE presenting with focal neurologic deficits.  She presented with complaints of difficulty writing her checks word finding and some degree of difficulty speaking as well.  There was concern that she had facial droop.  Patient was hospitalized for further management.  Consultants: Neurology  Procedures: EEG.  Echocardiogram    Subjective/Interval History: Patient mentioned that she had another episode this morning when she found difficulty speaking and had some numbness in her right hand.  Symptoms have resolved.  Still having difficulty speaking at times.  Seems a bit distracted.    Assessment/Plan:  Focal neurological deficits/history of TIA MRI was negative for acute stroke. LDL noted to be 135.  HbA1c 5.4. Echocardiogram shows LVEF of 65 to 70%.   Grade 1 diastolic dysfunction is noted.  No significant valvular abnormalities. EEG did not show any epileptiform activity. Reason for her presentation is not entirely clear but could be due to atypical migraine. Neurology continues to follow.  Patient noted to be on anticoagulation with Eliquis  but she is on a low-dose Eliquis . PT and OT eval pending.  Hyperlipidemia LDL noted to be elevated at 135.  Patient started on atorvastatin .  History of NSVT Takes propranolol  as needed.  History of hypotension On midodrine  3 times a day.  Currently noted to be hypertensive.  Continue to monitor.  May need to hold midodrine .  History of Parkinson's disease Followed by Dr. Evonnie with neurology.  Continue carbidopa  levodopa .  History of pulmonary embolism This was noted on CT angiogram in 2023.   Continue Eliquis .  Chronic diastolic CHF/atrial tachycardia/ascending  aortic aneurysm Stable.  DVT Prophylaxis: On Eliquis  Code Status: Full code Family Communication: Discussed with patient Disposition Plan: To be determined  Status is: Observation The patient remains OBS appropriate and may or may not d/c before 2 midnights.   Medications: Scheduled:   stroke: early stages of recovery book   Does not apply Once   apixaban   2.5 mg Oral BID   atorvastatin   20 mg Oral QPC supper   carbidopa -levodopa   1 tablet Oral QHS   carbidopa -levodopa   1.5 tablet Oral TID   midodrine   2.5-5 mg Oral TID WC   pantoprazole   40 mg Oral Daily   Continuous:  sodium chloride  40 mL/hr at 09/02/24 0610   PRN:acetaminophen  **OR** acetaminophen  (TYLENOL ) oral liquid 160 mg/5 mL **OR** acetaminophen , senna-docusate   Objective:  Vital Signs  Vitals:   09/01/24 2244 09/01/24 2343 09/02/24 0403 09/02/24 0840  BP: (!) 174/79 (!) 155/78 (!) 154/62 (!) 156/91  Pulse: 70 66 64 78  Resp: 18 16 16 18   Temp: 97.8 F (36.6 C) 97.7 F (36.5 C) 97.6 F (36.4 C) (!) 97.4 F (36.3 C)  TempSrc: Oral Oral Oral Oral  SpO2: 99% 97% 98% 100%  Weight:      Height:        Intake/Output Summary (Last 24 hours) at 09/02/2024 0940 Last data filed at 09/02/2024 0610 Gross per 24 hour  Intake 387.24 ml  Output --  Net 387.24 ml   Filed Weights   09/01/24 1300  Weight: 72.2 kg    General appearance: Awake alert.  In no distress.  Distracted Resp: Clear to auscultation bilaterally.  Normal effort Cardio: S1-S2 is normal regular.  No S3-S4.  No rubs murmurs or bruit GI: Abdomen is soft.  Nontender nondistended.  Bowel sounds are present normal.  No masses organomegaly Extremities: No edema.  Full range of motion of lower extremities. Neurologic: Noted to be moving all 4 extremities.  No facial asymmetry.   Lab Results:  Data Reviewed: I have personally reviewed following labs and reports of the imaging studies  CBC: Recent Labs  Lab 09/01/24 1343 09/01/24 1345  09/02/24 0139  WBC 4.3  --  4.6  NEUTROABS 3.2  --   --   HGB 12.8 13.3 12.6  HCT 38.4 39.0 38.4  MCV 94.8  --  94.6  PLT 159  --  155    Basic Metabolic Panel: Recent Labs  Lab 09/01/24 1343 09/01/24 1345 09/02/24 0139  NA 141 143 137  K 3.9 3.8 3.8  CL 108 107 105  CO2 23  --  23  GLUCOSE 90 88 91  BUN 18 21 16   CREATININE 0.72 0.70 0.65  CALCIUM  8.0*  --  8.4*    GFR: Estimated Creatinine Clearance: 50.3 mL/min (by C-G formula based on SCr of 0.65 mg/dL).  Liver Function Tests: Recent Labs  Lab 09/01/24 1343 09/02/24 0139  AST 19 18  ALT <5 <5  ALKPHOS 61 60  BILITOT 0.4 0.6  PROT 6.1* 6.1*  ALBUMIN  3.9 3.9    Coagulation Profile: Recent Labs  Lab 09/01/24 1343  INR 1.2   BNP (last 3 results) Recent Labs    10/22/23 1343 04/06/24 1631  PROBNP 209.0* 1,030.0*    HbA1C: Recent Labs    09/02/24 0139  HGBA1C 5.4    CBG: Recent Labs  Lab 09/01/24 1341  GLUCAP 90    Lipid Profile: Recent Labs    09/02/24 0139  CHOL 202*  HDL 45  LDLCALC 135*  TRIG 114  CHOLHDL 4.5    Radiology Studies: EEG adult Result Date: 09/02/2024 Shelton Arlin KIDD, MD     09/02/2024  9:01 AM Patient Name: Linda Lee MRN: [id number] Epilepsy Attending: Arlin KIDD Shelton Referring Physician/Provider: Jerri Pfeiffer, MD Date: 09/01/2024 Duration: 23.31 mins Patient history: 89yo F with speech difficulty and right facial droop. EEG to evaluate for seizure Level of alertness: Awake AEDs during EEG study: None Technical aspects: This EEG study was done with scalp electrodes positioned according to the 10-20 International system of electrode placement. Electrical activity was reviewed with band pass filter of 1-70Hz , sensitivity of 7 uV/mm, display speed of 27mm/sec with a 60Hz  notched filter applied as appropriate. EEG data were recorded continuously and digitally stored.  Video monitoring was available and reviewed as appropriate. Description: The posterior dominant  rhythm consists of 8-9 Hz activity of moderate voltage (25-35 uV) seen predominantly in posterior head regions, symmetric and reactive to eye opening and eye closing. EEG showed intermittent generalized and lateralized left hemisphere 3 to 6 Hz theta-delta slowing. Hyperventilation and photic stimulation were not performed.   ABNORMALITY - Intermittent slow, generalized and lateralized left hemisphere IMPRESSION: This study is suggestive of non specific cortical dysfunction arising from left hemisphere. Additionally there is generalized cerebral dysfunction (encephalopathy). No seizures or epileptiform discharges were seen throughout the recording. Arlin KIDD Shelton   ECHOCARDIOGRAM COMPLETE Result Date: 09/01/2024    ECHOCARDIOGRAM REPORT   Patient Name:   Linda Lee Date of Exam: 09/01/2024 Medical Rec #:  [id number]          Height:       65.0 in Accession #:  [id number]         Weight:       159.2 lb Date of Birth:  Jun 20, 1936         BSA:          1.795 m Patient Age:    88 years           BP:           156/62 mmHg Patient Gender: F                  HR:           70 bpm. Exam Location:  Inpatient Procedure: 2D Echo (Both Spectral and Color Flow Doppler were utilized during            procedure). Indications:    stroke  History:        Patient has prior history of Echocardiogram examinations, most                 recent 11/13/2023. CHF, Parkinson's; Risk Factors:Dyslipidemia.  Sonographer:    Tinnie Barefoot RDCS Referring Phys: [id number] JINDONG XU IMPRESSIONS  1. Left ventricular ejection fraction, by estimation, is 65 to 70%. The left ventricle has normal function. The left ventricle has no regional wall motion abnormalities. Left ventricular diastolic parameters are consistent with Grade I diastolic dysfunction (impaired relaxation).  2. Right ventricular systolic function is normal. The right ventricular size is normal.  3. The mitral valve is normal in structure. Trivial mitral valve  regurgitation. No evidence of mitral stenosis.  4. The aortic valve is tricuspid. Aortic valve regurgitation is mild to moderate. No aortic stenosis is present. Aortic regurgitation PHT measures 504 msec.  5. Aortic dilatation noted. There is mild dilatation of the ascending aorta, measuring 41 mm.  6. The inferior vena cava is normal in size with greater than 50% respiratory variability, suggesting right atrial pressure of 3 mmHg. FINDINGS  Left Ventricle: Left ventricular ejection fraction, by estimation, is 65 to 70%. The left ventricle has normal function. The left ventricle has no regional wall motion abnormalities. The left ventricular internal cavity size was normal in size. There is  no left ventricular hypertrophy. Left ventricular diastolic parameters are consistent with Grade I diastolic dysfunction (impaired relaxation). Right Ventricle: The right ventricular size is normal. No increase in right ventricular wall thickness. Right ventricular systolic function is normal. Left Atrium: Left atrial size was normal in size. Right Atrium: Right atrial size was normal in size. Pericardium: There is no evidence of pericardial effusion. Mitral Valve: The mitral valve is normal in structure. Trivial mitral valve regurgitation. No evidence of mitral valve stenosis. Tricuspid Valve: The tricuspid valve is normal in structure. Tricuspid valve regurgitation is trivial. No evidence of tricuspid stenosis. Aortic Valve: The aortic valve is tricuspid. Aortic valve regurgitation is mild to moderate. Aortic regurgitation PHT measures 504 msec. No aortic stenosis is present. Pulmonic Valve: The pulmonic valve was normal in structure. Pulmonic valve regurgitation is trivial. No evidence of pulmonic stenosis. Aorta: The aortic root is normal in size and structure and aortic dilatation noted. There is mild dilatation of the ascending aorta, measuring 41 mm. Venous: The inferior vena cava is normal in size with greater than 50%  respiratory variability, suggesting right atrial pressure of 3 mmHg. IAS/Shunts: No atrial level shunt detected by color flow Doppler.  LEFT VENTRICLE PLAX 2D LVIDd:         4.60 cm   Diastology LVIDs:  3.00 cm   LV e' medial:    7.30 cm/s LV PW:         1.00 cm   LV E/e' medial:  8.6 LV IVS:        1.10 cm   LV e' lateral:   6.84 cm/s LVOT diam:     1.80 cm   LV E/e' lateral: 9.2 LV SV:         61 LV SV Index:   34 LVOT Area:     2.54 cm LV IVRT:       144 msec  RIGHT VENTRICLE             IVC RV Basal diam:  2.50 cm     IVC diam: 1.20 cm RV S prime:     18.50 cm/s                             PULMONARY VEINS                             Diastolic Velocity: 36.30 cm/s                             S/D Velocity:       1.40                             Systolic Velocity:  51.40 cm/s LEFT ATRIUM             Index        RIGHT ATRIUM           Index LA diam:        3.30 cm 1.84 cm/m   RA Area:     12.30 cm LA Vol (A2C):   39.4 ml 21.95 ml/m  RA Volume:   24.20 ml  13.48 ml/m LA Vol (A4C):   41.5 ml 23.12 ml/m LA Biplane Vol: 40.8 ml 22.73 ml/m  AORTIC VALVE LVOT Vmax:   120.00 cm/s LVOT Vmean:  80.100 cm/s LVOT VTI:    0.239 m AI PHT:      504 msec  AORTA Ao Root diam: 3.50 cm Ao Asc diam:  4.10 cm MITRAL VALVE MV Area (PHT): 4.21 cm    SHUNTS MV Decel Time: 180 msec    Systemic VTI:  0.24 m MV E velocity: 62.80 cm/s  Systemic Diam: 1.80 cm MV A velocity: 93.00 cm/s MV E/A ratio:  0.68 Toribio Fuel MD Electronically signed by Toribio Fuel MD Signature Date/Time: 09/01/2024/9:19:03 PM    Final    MR BRAIN WO CONTRAST Result Date: 09/01/2024 EXAM: MRI BRAIN WITHOUT CONTRAST 09/01/2024 06:42:57 PM TECHNIQUE: Multiplanar multisequence MRI of the head/brain was performed without the administration of intravenous contrast. COMPARISON: CT head without contrast, CT angio head and neck, and CT perfusion dated 09/01/2024. MR head dated 08/11/2018. CLINICAL HISTORY: Neuro deficit, acute, stroke suspected.  Speech difficulty and right facial droop. FINDINGS: BRAIN AND VENTRICLES: No acute infarct. No intracranial hemorrhage. No mass. No midline shift. No hydrocephalus. Moderate periventricular and subcortical white matter changes are present bilaterally. The changes extend into the brainstem. The sella is unremarkable. Normal flow voids. ORBITS: Bilateral lens replacements are noted. The globes and orbits are otherwise within normal limits. SINUSES AND MASTOIDS: Moderate mucosal thickening is present in the left  maxillary sinus. Minimal fluid is present in the inferior mastoid air cells bilaterally. BONES AND SOFT TISSUES: Normal marrow signal. No soft tissue abnormality. Degenerative changes of the cervical spine are noted at C4-C5 and C5-C6. IMPRESSION: 1. No acute intracranial abnormality. 2. Chronic bilateral periventricular and subcortical white matter changes extending into the brainstem, most consistent with microvascular ischemic change. Electronically signed by: Lonni Necessary MD 09/01/2024 06:59 PM EST RP Workstation: [id number]   CT ANGIO HEAD NECK W WO CM W PERF (CODE STROKE) Result Date: 09/01/2024 EXAM: CTA Head and Neck with Perfusion 09/01/2024 02:03:00 PM TECHNIQUE: CTA of the head and neck was performed with and without the administration of 100 mL of iohexol  (OMNIPAQUE ) 350 MG/ML injection. 3D postprocessing with multiplanar reconstructions and MIPs was performed to evaluate the vascular anatomy. Cerebral perfusion analysis using computed tomography with contrast administration, including post-processing of parametric maps with determination of cerebral blood flow, cerebral blood volume, mean transit time and time-to-maximum. Automated exposure control, iterative reconstruction, and/or weight based adjustment of the mA/kV was utilized to reduce the radiation dose to as low as reasonably achievable. COMPARISON: CT head without contrast 09/01/2024 and CT angio head and neck 03/06/2023. CLINICAL  HISTORY: Neuro deficit, acute, stroke suspected. FINDINGS: CTA NECK: AORTIC ARCH AND ARCH VESSELS: Atherosclerotic calcifications are present in the distal aortic arch and proximal left subclavian artery without significant stenosis or change. No dissection or arterial injury. No significant stenosis of the brachiocephalic artery. CERVICAL CAROTID ARTERIES: Tortuosity is present in the proximal right internal carotid artery with a 50% stenosis. The left internal carotid artery is patent without significant stenosis or dissection. No arterial injury. CERVICAL VERTEBRAL ARTERIES: The vertebral arteries are codominant. No dissection, arterial injury, or significant stenosis. LUNGS AND MEDIASTINUM: Unremarkable. SOFT TISSUES: No acute abnormality. BONES: No acute abnormality. CTA HEAD: ANTERIOR CIRCULATION: No significant stenosis of the intracranial internal carotid arteries. No significant stenosis of the anterior cerebral arteries. Moderate stenosis is present in the superior right M2 segment without significant attenuation of distal vessels. The left middle cerebral artery is patent proximally without significant stenosis. No aneurysm. POSTERIOR CIRCULATION: No significant stenosis of the posterior cerebral arteries. No significant stenosis of the basilar artery. No significant stenosis of the intracranial vertebral arteries. No aneurysm. OTHER: No dural venous sinus thrombosis on this non-dedicated study. CT PERFUSION: EXAM QUALITY: Exam quality is adequate with diagnostic perfusion maps. No significant motion artifact. Appropriate arterial inflow and venous outflow curves. CORE INFARCT (CBF<30% volume): 0 mL TOTAL HYPOPERFUSION (Tmax>6s volume): 0 mL PENUMBRA: Mismatch volume: 0 mL Mismatch ratio: not applicable Location: not applicable IMPRESSION: 1. No acute large vessel occlusion. 2. No evidence of ischemia by CT brain perfusion (CBF 0 mL, Tmax 0 mL, mismatch 0 mL). 3. Tortuosity in the proximal right internal  carotid artery with 50% stenosis. 4. Moderate stenosis in the superior right M2 segment without significant attenuation of distal vessels. Electronically signed by: Lonni Necessary MD 09/01/2024 02:25 PM EST RP Workstation: [id number]   CT HEAD CODE STROKE WO CONTRAST Result Date: 09/01/2024 EXAM: CT HEAD WITHOUT CONTRAST 09/01/2024 01:49:00 PM TECHNIQUE: CT of the head was performed without the administration of intravenous contrast. Automated exposure control, iterative reconstruction, and/or weight based adjustment of the mA/kV was utilized to reduce the radiation dose to as low as reasonably achievable. COMPARISON: CTA head and neck 03/06/23 CLINICAL HISTORY: Neuro deficit, acute, STROKE suspected. FINDINGS: BRAIN AND VENTRICLES: Streak artifact from hearing aids obscures portions of the left greater than right temporal lobes and  posterior fossa. Within this limitation, no acute large territory infarct, intracranial hemorrhage, mass, midline shift, hydrocephalus, or extra-axial fluid collection is identified. Mild cerebral atrophy is within normal limits for age. Hypodensities in the cerebral white matter bilaterally are similar to the prior study and nonspecific but compatible with mild chronic small vessel ischemic disease. Calcified atherosclerosis at the skull base. ORBITS: Bilateral cataract extraction. SINUSES: Partially visualized chronic left maxillary sinusitis. Clear mastoid air cells. SOFT TISSUES AND SKULL: No acute soft tissue abnormality. No skull fracture. ALBERTA STROKE PROGRAM EARLY CT SCORE (ASPECTS) ----- Ganglionic (caudate, IC, lentiform nucleus, insula, M1-M3): 7 Supraganglionic (M4-M6): 3 Total: 10 IMPRESSION: 1. No acute intracranial abnormality. ASPECTS of 10. 2. Mild chronic small vessel ischemic disease. 3. These results were communicated to Dr. DOROTHA Cummins at 1:57pm on 09/01/2024 by secure text page via the [facility] messaging system. Electronically signed by: Dasie Hamburg MD 09/01/2024  02:00 PM EST RP Workstation: [id number]       LOS: 0 days   Joette Pebbles  [facility] Pager on [email or web address]  09/02/2024, 9:40 AM   "

## 2024-09-02 NOTE — Evaluation (Signed)
 Speech Language Pathology Evaluation Patient Details Name: LAYANN BLUETT MRN: [id number] DOB: 18-Sep-1935 Today's Date: 09/02/2024 Time: 0900-0920 SLP Time Calculation (min) (ACUTE ONLY): 20 min  Problem List:  Patient Active Problem List   Diagnosis Date Noted   Focal neurological deficit 09/01/2024   Tachycardia 08/06/2024   Degeneration of lumbar intervertebral disc 01/26/2024   Spinal stenosis of lumbar region 01/26/2024   Fatigue 04/29/2022   Osteoporosis 02/21/2022   Lung infiltrate 02/11/2022   Acute pulmonary embolism (HCC) 01/19/2022   Elevated troponin level not due myocardial infarction 01/19/2022   Asymptomatic bacteriuria 01/19/2022   Pain in right hand 12/23/2019   Pain in left knee 11/11/2019   Nonsustained ventricular tachycardia (HCC) 10/06/2018   Migraine 10/06/2018   History of TIA (transient ischemic attack) 10/05/2018   Coronary artery calcification seen on CT scan 10/05/2018   Parkinson disease (HCC) 12/10/2017   Carpal tunnel syndrome of left wrist 12/08/2017   Ulnar neuropathy 12/08/2017   Dyspnea on exertion 06/17/2017   OA (osteoarthritis) of knee 09/25/2015   Lower urinary tract infectious disease 09/04/2012   Diastolic CHF, chronic (HCC) 09/04/2012   Subjective visual disturbance, right eye 09/03/2012   Vision disturbance 09/03/2012   Ascending aortic aneurysm (HCC) 07/06/2009   Mixed hyperlipidemia 06/30/2009   GERD without esophagitis 06/30/2009   LEG CRAMPS 06/30/2009   Past Medical History:  Past Medical History:  Diagnosis Date   Anginal pain    Angio-edema    Arthritis    Atypical chest pain 07/06/2009   Qualifier: Diagnosis of  By: Verlin, MD, Christopher     GERD (gastroesophageal reflux disease)    Hyperlipemia    Migraines    Toxic effect of venom of bees, unintentional 06/03/2017   Past Surgical History:  Past Surgical History:  Procedure Laterality Date   ADENOIDECTOMY     BACK SURGERY     HEMORRHOID SURGERY      RIGHT/LEFT HEART CATH AND CORONARY ANGIOGRAPHY N/A 06/17/2017   Procedure: RIGHT/LEFT HEART CATH AND CORONARY ANGIOGRAPHY;  Surgeon: Ladona Heinz, MD;  Location: [facility];  Service: Cardiovascular;  Laterality: N/A;   Thoracicc Aortic Derryl 07/2009     denies surgery for this   TONSILLECTOMY     TOTAL KNEE ARTHROPLASTY Right 09/25/2015   Procedure: TOTAL KNEE ARTHROPLASTY;  Surgeon: Dempsey Moan, MD;  Location: [facility];  Service: Orthopedics;  Laterality: Right;   HPI:  Pt is a 89 y.o female presenting to [facility] 1/28 for R sided facial droop and aphasia. CT negative for acute changes.PMH: HLD, CHF, Parkinson's, TIA, PE, Orthostatic hypotension   Assessment / Plan / Recommendation Clinical Impression  Patient presents with mild cognitive deficits likely present at basline, though no family present to corroborate information. Patient accurately reports current reason for hospitalization, events leading up to hospital admission, and date/time independently. During discussion, hearing impacted comprehension to a mild degree, however suspect receptive/expressive language are intact. Patient reports that during earlier portion of admission, speech and language were impaired, however notes that these deficits have resolved. SLP administered SLUMS examination where patient scored 21/30, suggesting mild cognitive impairment. Patient required assistance with subtests assessing immediate and delayed recall but scored WFL on all other tested domains. No acute needs identified, however patient may benefit from SLP services at assisted living center to target suspected baseline mild memory deficits. SLP will sign off acutely.    SLP Assessment  SLP Recommendation/Assessment: All further Speech Language Pathology needs can be addressed in the next venue  of care SLP Visit Diagnosis: Cognitive communication deficit (R41.841)     Assistance Recommended at Discharge  PRN  Functional Status Assessment  Patient has not had a recent decline in their functional status  Frequency and Duration           SLP Evaluation Cognition  Overall Cognitive Status: No family/caregiver present to determine baseline cognitive functioning Arousal/Alertness: Awake/alert Orientation Level: Oriented X4 Year: 2026 Month: January Day of Week: Correct Attention: Sustained Sustained Attention: Appears intact Memory: Impaired Memory Impairment: Storage deficit;Retrieval deficit;Decreased recall of new information Awareness: Impaired Awareness Impairment: Intellectual impairment Problem Solving: Appears intact Safety/Judgment: Appears intact       Comprehension  Auditory Comprehension Overall Auditory Comprehension: Appears within functional limits for tasks assessed Interfering Components: Hearing EffectiveTechniques: Extra processing time;Increased volume Reading Comprehension Reading Status: Not tested    Expression Expression Primary Mode of Expression: Verbal Verbal Expression Overall Verbal Expression: Appears within functional limits for tasks assessed Initiation: No impairment Level of Generative/Spontaneous Verbalization: Conversation Repetition: No impairment Naming: No impairment Pragmatics: No impairment Interfering Components: Attention Non-Verbal Means of Communication: Not applicable Written Expression Written Expression: Not tested   Oral / Motor  Oral Motor/Sensory Function Overall Oral Motor/Sensory Function: Within functional limits Motor Speech Overall Motor Speech: Appears within functional limits for tasks assessed Respiration: Within functional limits Phonation: Normal Resonance: Within functional limits Articulation: Within functional limitis Intelligibility: Intelligible Motor Planning: Within functional limits Motor Speech Errors: Not applicable            Rosina A Mychele Seyller 09/02/2024, 11:35 AM

## 2024-09-02 NOTE — Procedures (Signed)
 Patient Name: Linda Lee  MRN: [id number]  Epilepsy Attending: Arlin MALVA Krebs  Referring Physician/Provider: Jerri Pfeiffer, MD  Date: 09/01/2024 Duration: 23.31 mins  Patient history: 89yo F with speech difficulty and right facial droop. EEG to evaluate for seizure  Level of alertness: Awake  AEDs during EEG study: None  Technical aspects: This EEG study was done with scalp electrodes positioned according to the 10-20 International system of electrode placement. Electrical activity was reviewed with band pass filter of 1-70Hz , sensitivity of 7 uV/mm, display speed of 89mm/sec with a 60Hz  notched filter applied as appropriate. EEG data were recorded continuously and digitally stored.  Video monitoring was available and reviewed as appropriate.  Description: The posterior dominant rhythm consists of 8-9 Hz activity of moderate voltage (25-35 uV) seen predominantly in posterior head regions, symmetric and reactive to eye opening and eye closing. EEG showed intermittent generalized and lateralized left hemisphere 3 to 6 Hz theta-delta slowing. Hyperventilation and photic stimulation were not performed.     ABNORMALITY - Intermittent slow, generalized and lateralized left hemisphere  IMPRESSION: This study is suggestive of non specific cortical dysfunction arising from left hemisphere. Additionally there is generalized cerebral dysfunction (encephalopathy). No seizures or epileptiform discharges were seen throughout the recording.  Coye Dawood O Tison Leibold

## 2024-09-02 NOTE — Evaluation (Signed)
 Physical Therapy Evaluation Patient Details Name: Linda Lee MRN: [id number] DOB: 1935/12/29 Today's Date: 09/02/2024  History of Present Illness  Pt is a 89 y.o female presenting to [facility] for R sided facial droop and aphasia. CT and MRI negative for acute changes. EEG suggestive of non-specific cortical dysfunction arising from left hemisphere as well as generalized cerebral dysfunction (encephalopathy). PMH: HLD, CHF, Parkinson's, TIA, PE, Orthostatic hypotension.   Clinical Impression  Pt admitted with above diagnosis. PTA, pt was modI for functional mobility ambulating by furniture walking or using an AD (SPC vs. RW), modI with ADLs/IADLs, and driving. She lives alone in a house with a level entry from her garage. Pt reports she is able to resides on the main level and her daughter/son-in-law are coming into town to stay with her upon d/c. Pt currently with functional limitations due to the deficits listed below (see PT Problem List). She performed bed mobility with modI and required CGA for transfers, gait, and stairs using RW. Pt is currently limited by decreased cognition which may be basline, though no family present to corroborate information, generalized weakness, impaired balance, and decreased activity tolerance. Pt will benefit from acute skilled PT to increase her independence and safety with mobility to allow discharge. Recommend HHPT to increased strength, improve balance, decrease fall risk, and optimize safety within the home environment as long as family can provide increased support.     If plan is discharge home, recommend the following: A little help with walking and/or transfers;A little help with bathing/dressing/bathroom;Assistance with cooking/housework;Assist for transportation;Help with stairs or ramp for entrance;Supervision due to cognitive status   Can travel by private vehicle        Equipment Recommendations None recommended by PT (Pt already has needed  DME)  Recommendations for Other Services       Functional Status Assessment Patient has had a recent decline in their functional status and demonstrates the ability to make significant improvements in function in a reasonable and predictable amount of time.     Precautions / Restrictions Precautions Precautions: Fall Recall of Precautions/Restrictions: Impaired Restrictions Weight Bearing Restrictions Per Provider Order: No      Mobility  Bed Mobility Overal bed mobility: Modified Independent             General bed mobility comments: Pt greeted seated in recliner chair. Returned to bed, swining BLE back in and getting repositioned to her comfort.    Transfers Overall transfer level: Needs assistance Equipment used: None, Rolling walker (2 wheels) Transfers: Sit to/from Stand Sit to Stand: Contact guard assist           General transfer comment: Pt stood from recliner chair. Cued proper hand placement. Powered up with CGA. Pt unsteady without AD, reaching for furniture/PT/IV pole for support.    Ambulation/Gait Ambulation/Gait assistance: Contact guard assist Gait Distance (Feet): 60 Feet (x2, prolonged seated rest break) Assistive device: Rolling walker (2 wheels) Gait Pattern/deviations: Step-to pattern, Decreased step length - right, Decreased step length - left, Decreased stride length, Decreased dorsiflexion - right, Decreased dorsiflexion - left, Narrow base of support Gait velocity: decr Gait velocity interpretation: <1.8 ft/sec, indicate of risk for recurrent falls   General Gait Details: Pt ambulated with short slow steps. She initially was shuffling, which pt reports is baseline for her until she warms-up. Pt maintained a NBOS, slight fwd lean, and minimal foot clearence. Pt fatigued requiring prolonged seated rest break. She was unsteady throughout, but no overt LOB.  Stairs  Stairs: Yes Stairs assistance: Contact guard assist Stair Management: One  rail Right, One rail Left, Forwards, Step to pattern Number of Stairs: 3 General stair comments: Pt ascended/descended shorter steps with BUE support on rails and taking each step one at a time.  Wheelchair Mobility     Tilt Bed    Modified Rankin (Stroke Patients Only)       Balance Overall balance assessment: Needs assistance Sitting-balance support: No upper extremity supported, Feet supported Sitting balance-Leahy Scale: Fair     Standing balance support: Bilateral upper extremity supported, During functional activity, Reliant on assistive device for balance Standing balance-Leahy Scale: Poor Standing balance comment: Reliant on RW                             Pertinent Vitals/Pain Pain Assessment Pain Assessment: No/denies pain    Home Living Family/patient expects to be discharged to:: Private residence Living Arrangements: Alone Available Help at Discharge: Family;Available PRN/intermittently (Pt reports daughter and son-in-law could likely stay with her if needed) Type of Home: House Home Access: Stairs to enter;Level entry Entrance Stairs-Rails: Can reach both;Right;Left Entrance Stairs-Number of Steps: 4 (Garage none)   Home Layout: Two level;Able to live on main level with bedroom/bathroom Home Equipment: Rolling Walker (2 wheels);Wheelchair Financial Trader (4 wheels);Shower seat;Grab bars - tub/shower;Grab bars - toilet;Hand held shower head;Cane - single point;BSC/3in1      Prior Function Prior Level of Function : Independent/Modified Independent;Driving             Mobility Comments: Ind, denies falls. Reports a mixture of furniture walking, SPC, and RW ADLs Comments: Ind, driving, manages own meds     Extremity/Trunk Assessment   Upper Extremity Assessment Upper Extremity Assessment: Defer to OT evaluation    Lower Extremity Assessment Lower Extremity Assessment: Generalized weakness    Cervical / Trunk Assessment Cervical /  Trunk Assessment: Normal  Communication   Communication Communication: No apparent difficulties    Cognition Arousal: Alert Behavior During Therapy: WFL for tasks assessed/performed   PT - Cognitive impairments: No family/caregiver present to determine baseline, Memory, Problem solving, Safety/Judgement                       PT - Cognition Comments: Pt A,Ox4. She appears to be confused and is a questionable historian at times. No family present to determine baseline. Following commands: Impaired Following commands impaired: Follows multi-step commands inconsistently     Cueing Cueing Techniques: Verbal cues     General Comments General comments (skin integrity, edema, etc.): VSS on RA. Pt c/o BP dropping during gait which she has a hx of.    Exercises     Assessment/Plan    PT Assessment Patient needs continued PT services  PT Problem List Decreased strength;Decreased activity tolerance;Decreased balance;Decreased mobility;Decreased cognition;Decreased knowledge of use of DME;Decreased safety awareness       PT Treatment Interventions DME instruction;Gait training;Stair training;Functional mobility training;Therapeutic activities;Therapeutic exercise;Balance training;Cognitive remediation;Patient/family education    PT Goals (Current goals can be found in the Care Plan section)  Acute Rehab PT Goals Patient Stated Goal: Return Home PT Goal Formulation: With patient Time For Goal Achievement: 09/16/24 Potential to Achieve Goals: Good    Frequency Min 2X/week     Co-evaluation               AM-PAC PT 6 Clicks Mobility  Outcome Measure Help needed turning from your back to your  side while in a flat bed without using bedrails?: A Little Help needed moving from lying on your back to sitting on the side of a flat bed without using bedrails?: A Little Help needed moving to and from a bed to a chair (including a wheelchair)?: A Little Help needed standing  up from a chair using your arms (e.g., wheelchair or bedside chair)?: A Little Help needed to walk in hospital room?: A Little Help needed climbing 3-5 steps with a railing? : A Little 6 Click Score: 18    End of Session Equipment Utilized During Treatment: Gait belt Activity Tolerance: Patient tolerated treatment well;Patient limited by fatigue Patient left: in bed;with call bell/phone within reach;with bed alarm set Nurse Communication: Mobility status PT Visit Diagnosis: Difficulty in walking, not elsewhere classified (R26.2);Other abnormalities of gait and mobility (R26.89);Unsteadiness on feet (R26.81);Muscle weakness (generalized) (M62.81)    Time: 8885-8865 PT Time Calculation (min) (ACUTE ONLY): 20 min   Charges:   PT Evaluation $PT Eval Moderate Complexity: 1 Mod   PT General Charges $$ ACUTE PT VISIT: 1 Visit         Randall SAUNDERS, PT, DPT Acute Rehabilitation Services Office: [phone] Secure Chat Preferred  Delon CHRISTELLA Callander 09/02/2024, 12:54 PM

## 2024-09-02 NOTE — TOC CAGE-AID Note (Signed)
 Transition of Care [facility]) - CAGE-AID Screening   Patient Details  Name: Linda Lee MRN: [id number] Date of Birth: 09-02-1935  Transition of Care [facility]) CM/SW Contact:    Almarie CHRISTELLA Goodie, LCSW Phone Number: 09/02/2024, 2:25 PM   Clinical Narrative:  Patient denied drinking or drug use.    CAGE-AID Screening:    Have You Ever Felt You Ought to Cut Down on Your Drinking or Drug Use?: No Have People Annoyed You By Critizing Your Drinking Or Drug Use?: No Have You Felt Bad Or Guilty About Your Drinking Or Drug Use?: No Have You Ever Had a Drink or Used Drugs First Thing In The Morning to Steady Your Nerves or to Get Rid of a Hangover?: No CAGE-AID Score: 0  Substance Abuse Education Offered: No

## 2024-09-02 NOTE — TOC Transition Note (Addendum)
 Transition of Care [facility]) - Discharge Note   Patient Details  Name: Linda Lee MRN: [id number] Date of Birth: 08-28-1935  [facility]) CM/SW Contact:  Corean JAYSON Canary, RN Phone Number: 09/02/2024, 2:41 PM   Clinical Narrative:     Patient is discharging. Called her to discuss discharge planning and home health. Explained home health and visits. The patient states she is moving to [place] To an assisted living. It will be in about a month. She is fine to have [facility] services until then. She request Medicare.gov link, which was sent to her via text.  HH placed in HUB for acceptance. Final next level of care: Home w Home Health Services Barriers to Discharge: No Barriers Identified   Patient Goals and CMS Choice            Discharge Placement               Home with home health        Discharge Plan and Services Additional resources added to the After Visit Summary for                            [facility] Arranged: PT, OT (placed in HUB)          Social Drivers of Health (SDOH) Interventions SDOH Screenings   Food Insecurity: No Food Insecurity (09/02/2024)  Housing: Low Risk (09/02/2024)  Transportation Needs: No Transportation Needs (09/02/2024)  Utilities: Not At Risk (09/02/2024)  Social Connections: Moderately Integrated (09/02/2024)  Tobacco Use: Medium Risk (09/02/2024)     Readmission Risk Interventions     No data to display

## 2024-09-02 NOTE — Care Management Obs Status (Signed)
 MEDICARE OBSERVATION STATUS NOTIFICATION   Patient Details  Name: Linda Lee MRN: [id number] Date of Birth: 03-26-1936   Medicare Observation Status Notification Given:  Yes  Verbally reviewed observation notice with [name] telephonically at [phone]. Will mail a copy to the patients home address.    [name] 09/02/2024, 3:45 PM

## 2024-09-02 NOTE — Discharge Summary (Signed)
 " [facility]  Physician Discharge Summary   Patient ID: Linda Lee MRN: [id number] DOB/AGE: 10-12-1935 89 y.o.  Admit date: 09/01/2024 Discharge date: 09/02/2024    PCP: Shayne Anes, MD  DISCHARGE DIAGNOSES:  TIA versus complex migraine   Parkinson disease (HCC)   Mixed hyperlipidemia   GERD without esophagitis   Ascending aortic aneurysm (HCC)   Diastolic CHF, chronic (HCC)   History of TIA (transient ischemic attack)   Nonsustained ventricular tachycardia (HCC)   RECOMMENDATIONS FOR OUTPATIENT FOLLOW UP: Ambulatory referral sent to neurology   Home Health: PT OT Equipment/Devices: None  CODE STATUS: Full code  DISCHARGE CONDITION: fair  Diet recommendation: As before  INITIAL HISTORY: 89 y.o. female with medical history significant of hyperlipidemia, TIA, GERD, NSVT, Parkinson disease, ascending aortic aneurysm, PE presenting with focal neurologic deficits.  She presented with complaints of difficulty writing her checks word finding and some degree of difficulty speaking as well.  There was concern that she had facial droop.  Patient was hospitalized for further management.   Consultants: Neurology   Procedures: EEG.  Echocardiogram  HOSPITAL COURSE:    Focal neurological deficits/history of TIA MRI was negative for acute stroke. LDL noted to be 135.  HbA1c 5.4.  Patient started on statin. Echocardiogram shows LVEF of 65 to 70%.   Grade 1 diastolic dysfunction is noted.  No significant valvular abnormalities. EEG did not show any epileptiform activity. Reason for her presentation is not entirely clear but could be due to complex migraine.  TIA is also another possibility. Patient was noted to be on low-dose Eliquis .  Neurology discussed with cardiology due to concern for atrial tachycardia and atrial fibrillation.  No conclusive evidence for A-fib but higher dose of Eliquis  is recommended.  She will be discharged on the higher dose.  This was  discussed with patient and her son. Seen by PT and OT.  Home health ordered.   Hyperlipidemia LDL noted to be elevated at 135.  Patient started on atorvastatin .   History of NSVT Takes propranolol  as needed.   History of hypotension On midodrine  3 times a day.     History of Parkinson's disease Followed by Dr. Evonnie with neurology.  Continue carbidopa  levodopa .   History of pulmonary embolism This was noted on CT angiogram in 2023.   Continue Eliquis  at higher dose as discussed above.   Chronic diastolic CHF/atrial tachycardia/ascending aortic aneurysm Stable.  Patient is stable.  Discussed with son.  Okay for discharge home today.  PERTINENT LABS:  The results of significant diagnostics from this hospitalization (including imaging, microbiology, ancillary and laboratory) are listed below for reference.     Labs:   Basic Metabolic Panel: Recent Labs  Lab 09/01/24 1343 09/01/24 1345 09/02/24 0139  NA 141 143 137  K 3.9 3.8 3.8  CL 108 107 105  CO2 23  --  23  GLUCOSE 90 88 91  BUN 18 21 16   CREATININE 0.72 0.70 0.65  CALCIUM  8.0*  --  8.4*   Liver Function Tests: Recent Labs  Lab 09/01/24 1343 09/02/24 0139  AST 19 18  ALT <5 <5  ALKPHOS 61 60  BILITOT 0.4 0.6  PROT 6.1* 6.1*  ALBUMIN  3.9 3.9    CBC: Recent Labs  Lab 09/01/24 1343 09/01/24 1345 09/02/24 0139  WBC 4.3  --  4.6  NEUTROABS 3.2  --   --   HGB 12.8 13.3 12.6  HCT 38.4 39.0 38.4  MCV 94.8  --  94.6  PLT 159  --  155    CBG: Recent Labs  Lab 09/01/24 1341  GLUCAP 90     IMAGING STUDIES EEG adult Result Date: 09/02/2024 Shelton Arlin KIDD, MD     09/02/2024  9:01 AM Patient Name: Linda Lee MRN: [id number] Epilepsy Attending: Arlin KIDD Shelton Referring Physician/Provider: Jerri Pfeiffer, MD Date: 09/01/2024 Duration: 23.31 mins Patient history: 89yo F with speech difficulty and right facial droop. EEG to evaluate for seizure Level of alertness: Awake AEDs during EEG study:  None Technical aspects: This EEG study was done with scalp electrodes positioned according to the 10-20 International system of electrode placement. Electrical activity was reviewed with band pass filter of 1-70Hz , sensitivity of 7 uV/mm, display speed of 27mm/sec with a 60Hz  notched filter applied as appropriate. EEG data were recorded continuously and digitally stored.  Video monitoring was available and reviewed as appropriate. Description: The posterior dominant rhythm consists of 8-9 Hz activity of moderate voltage (25-35 uV) seen predominantly in posterior head regions, symmetric and reactive to eye opening and eye closing. EEG showed intermittent generalized and lateralized left hemisphere 3 to 6 Hz theta-delta slowing. Hyperventilation and photic stimulation were not performed.   ABNORMALITY - Intermittent slow, generalized and lateralized left hemisphere IMPRESSION: This study is suggestive of non specific cortical dysfunction arising from left hemisphere. Additionally there is generalized cerebral dysfunction (encephalopathy). No seizures or epileptiform discharges were seen throughout the recording. Arlin KIDD Shelton   ECHOCARDIOGRAM COMPLETE Result Date: 09/01/2024    ECHOCARDIOGRAM REPORT   Patient Name:   Linda Lee Date of Exam: 09/01/2024 Medical Rec #:  [id number]          Height:       65.0 in Accession #:    [id number]         Weight:       159.2 lb Date of Birth:  1935/08/20         BSA:          1.795 m Patient Age:    89 years           BP:           156/62 mmHg Patient Gender: F                  HR:           70 bpm. Exam Location:  Inpatient Procedure: 2D Echo (Both Spectral and Color Flow Doppler were utilized during            procedure). Indications:    stroke  History:        Patient has prior history of Echocardiogram examinations, most                 recent 11/13/2023. CHF, Parkinson's; Risk Factors:Dyslipidemia.  Sonographer:    Tinnie Barefoot RDCS Referring Phys: [id number]  JINDONG XU IMPRESSIONS  1. Left ventricular ejection fraction, by estimation, is 65 to 70%. The left ventricle has normal function. The left ventricle has no regional wall motion abnormalities. Left ventricular diastolic parameters are consistent with Grade I diastolic dysfunction (impaired relaxation).  2. Right ventricular systolic function is normal. The right ventricular size is normal.  3. The mitral valve is normal in structure. Trivial mitral valve regurgitation. No evidence of mitral stenosis.  4. The aortic valve is tricuspid. Aortic valve regurgitation is mild to moderate. No aortic stenosis is present. Aortic regurgitation PHT measures 504 msec.  5. Aortic dilatation noted. There is mild  dilatation of the ascending aorta, measuring 41 mm.  6. The inferior vena cava is normal in size with greater than 50% respiratory variability, suggesting right atrial pressure of 3 mmHg. FINDINGS  Left Ventricle: Left ventricular ejection fraction, by estimation, is 65 to 70%. The left ventricle has normal function. The left ventricle has no regional wall motion abnormalities. The left ventricular internal cavity size was normal in size. There is  no left ventricular hypertrophy. Left ventricular diastolic parameters are consistent with Grade I diastolic dysfunction (impaired relaxation). Right Ventricle: The right ventricular size is normal. No increase in right ventricular wall thickness. Right ventricular systolic function is normal. Left Atrium: Left atrial size was normal in size. Right Atrium: Right atrial size was normal in size. Pericardium: There is no evidence of pericardial effusion. Mitral Valve: The mitral valve is normal in structure. Trivial mitral valve regurgitation. No evidence of mitral valve stenosis. Tricuspid Valve: The tricuspid valve is normal in structure. Tricuspid valve regurgitation is trivial. No evidence of tricuspid stenosis. Aortic Valve: The aortic valve is tricuspid. Aortic valve  regurgitation is mild to moderate. Aortic regurgitation PHT measures 504 msec. No aortic stenosis is present. Pulmonic Valve: The pulmonic valve was normal in structure. Pulmonic valve regurgitation is trivial. No evidence of pulmonic stenosis. Aorta: The aortic root is normal in size and structure and aortic dilatation noted. There is mild dilatation of the ascending aorta, measuring 41 mm. Venous: The inferior vena cava is normal in size with greater than 50% respiratory variability, suggesting right atrial pressure of 3 mmHg. IAS/Shunts: No atrial level shunt detected by color flow Doppler.  LEFT VENTRICLE PLAX 2D LVIDd:         4.60 cm   Diastology LVIDs:         3.00 cm   LV e' medial:    7.30 cm/s LV PW:         1.00 cm   LV E/e' medial:  8.6 LV IVS:        1.10 cm   LV e' lateral:   6.84 cm/s LVOT diam:     1.80 cm   LV E/e' lateral: 9.2 LV SV:         61 LV SV Index:   34 LVOT Area:     2.54 cm LV IVRT:       144 msec  RIGHT VENTRICLE             IVC RV Basal diam:  2.50 cm     IVC diam: 1.20 cm RV S prime:     18.50 cm/s                             PULMONARY VEINS                             Diastolic Velocity: 36.30 cm/s                             S/D Velocity:       1.40                             Systolic Velocity:  51.40 cm/s LEFT ATRIUM             Index        RIGHT ATRIUM  Index LA diam:        3.30 cm 1.84 cm/m   RA Area:     12.30 cm LA Vol (A2C):   39.4 ml 21.95 ml/m  RA Volume:   24.20 ml  13.48 ml/m LA Vol (A4C):   41.5 ml 23.12 ml/m LA Biplane Vol: 40.8 ml 22.73 ml/m  AORTIC VALVE LVOT Vmax:   120.00 cm/s LVOT Vmean:  80.100 cm/s LVOT VTI:    0.239 m AI PHT:      504 msec  AORTA Ao Root diam: 3.50 cm Ao Asc diam:  4.10 cm MITRAL VALVE MV Area (PHT): 4.21 cm    SHUNTS MV Decel Time: 180 msec    Systemic VTI:  0.24 m MV E velocity: 62.80 cm/s  Systemic Diam: 1.80 cm MV A velocity: 93.00 cm/s MV E/A ratio:  0.68 Toribio Fuel MD Electronically signed by Toribio Fuel MD  Signature Date/Time: 09/01/2024/9:19:03 PM    Final    MR BRAIN WO CONTRAST Result Date: 09/01/2024 EXAM: MRI BRAIN WITHOUT CONTRAST 09/01/2024 06:42:57 PM TECHNIQUE: Multiplanar multisequence MRI of the head/brain was performed without the administration of intravenous contrast. COMPARISON: CT head without contrast, CT angio head and neck, and CT perfusion dated 09/01/2024. MR head dated 08/11/2018. CLINICAL HISTORY: Neuro deficit, acute, stroke suspected. Speech difficulty and right facial droop. FINDINGS: BRAIN AND VENTRICLES: No acute infarct. No intracranial hemorrhage. No mass. No midline shift. No hydrocephalus. Moderate periventricular and subcortical white matter changes are present bilaterally. The changes extend into the brainstem. The sella is unremarkable. Normal flow voids. ORBITS: Bilateral lens replacements are noted. The globes and orbits are otherwise within normal limits. SINUSES AND MASTOIDS: Moderate mucosal thickening is present in the left maxillary sinus. Minimal fluid is present in the inferior mastoid air cells bilaterally. BONES AND SOFT TISSUES: Normal marrow signal. No soft tissue abnormality. Degenerative changes of the cervical spine are noted at C4-C5 and C5-C6. IMPRESSION: 1. No acute intracranial abnormality. 2. Chronic bilateral periventricular and subcortical white matter changes extending into the brainstem, most consistent with microvascular ischemic change. Electronically signed by: Lonni Necessary MD 09/01/2024 06:59 PM EST RP Workstation: [id number]   CT ANGIO HEAD NECK W WO CM W PERF (CODE STROKE) Result Date: 09/01/2024 EXAM: CTA Head and Neck with Perfusion 09/01/2024 02:03:00 PM TECHNIQUE: CTA of the head and neck was performed with and without the administration of 100 mL of iohexol  (OMNIPAQUE ) 350 MG/ML injection. 3D postprocessing with multiplanar reconstructions and MIPs was performed to evaluate the vascular anatomy. Cerebral perfusion analysis using  computed tomography with contrast administration, including post-processing of parametric maps with determination of cerebral blood flow, cerebral blood volume, mean transit time and time-to-maximum. Automated exposure control, iterative reconstruction, and/or weight based adjustment of the mA/kV was utilized to reduce the radiation dose to as low as reasonably achievable. COMPARISON: CT head without contrast 09/01/2024 and CT angio head and neck 03/06/2023. CLINICAL HISTORY: Neuro deficit, acute, stroke suspected. FINDINGS: CTA NECK: AORTIC ARCH AND ARCH VESSELS: Atherosclerotic calcifications are present in the distal aortic arch and proximal left subclavian artery without significant stenosis or change. No dissection or arterial injury. No significant stenosis of the brachiocephalic artery. CERVICAL CAROTID ARTERIES: Tortuosity is present in the proximal right internal carotid artery with a 50% stenosis. The left internal carotid artery is patent without significant stenosis or dissection. No arterial injury. CERVICAL VERTEBRAL ARTERIES: The vertebral arteries are codominant. No dissection, arterial injury, or significant stenosis. LUNGS AND MEDIASTINUM: Unremarkable. SOFT TISSUES: No acute  abnormality. BONES: No acute abnormality. CTA HEAD: ANTERIOR CIRCULATION: No significant stenosis of the intracranial internal carotid arteries. No significant stenosis of the anterior cerebral arteries. Moderate stenosis is present in the superior right M2 segment without significant attenuation of distal vessels. The left middle cerebral artery is patent proximally without significant stenosis. No aneurysm. POSTERIOR CIRCULATION: No significant stenosis of the posterior cerebral arteries. No significant stenosis of the basilar artery. No significant stenosis of the intracranial vertebral arteries. No aneurysm. OTHER: No dural venous sinus thrombosis on this non-dedicated study. CT PERFUSION: EXAM QUALITY: Exam quality is  adequate with diagnostic perfusion maps. No significant motion artifact. Appropriate arterial inflow and venous outflow curves. CORE INFARCT (CBF<30% volume): 0 mL TOTAL HYPOPERFUSION (Tmax>6s volume): 0 mL PENUMBRA: Mismatch volume: 0 mL Mismatch ratio: not applicable Location: not applicable IMPRESSION: 1. No acute large vessel occlusion. 2. No evidence of ischemia by CT brain perfusion (CBF 0 mL, Tmax 0 mL, mismatch 0 mL). 3. Tortuosity in the proximal right internal carotid artery with 50% stenosis. 4. Moderate stenosis in the superior right M2 segment without significant attenuation of distal vessels. Electronically signed by: Lonni Necessary MD 09/01/2024 02:25 PM EST RP Workstation: [id number]   CT HEAD CODE STROKE WO CONTRAST Result Date: 09/01/2024 EXAM: CT HEAD WITHOUT CONTRAST 09/01/2024 01:49:00 PM TECHNIQUE: CT of the head was performed without the administration of intravenous contrast. Automated exposure control, iterative reconstruction, and/or weight based adjustment of the mA/kV was utilized to reduce the radiation dose to as low as reasonably achievable. COMPARISON: CTA head and neck 03/06/23 CLINICAL HISTORY: Neuro deficit, acute, STROKE suspected. FINDINGS: BRAIN AND VENTRICLES: Streak artifact from hearing aids obscures portions of the left greater than right temporal lobes and posterior fossa. Within this limitation, no acute large territory infarct, intracranial hemorrhage, mass, midline shift, hydrocephalus, or extra-axial fluid collection is identified. Mild cerebral atrophy is within normal limits for age. Hypodensities in the cerebral white matter bilaterally are similar to the prior study and nonspecific but compatible with mild chronic small vessel ischemic disease. Calcified atherosclerosis at the skull base. ORBITS: Bilateral cataract extraction. SINUSES: Partially visualized chronic left maxillary sinusitis. Clear mastoid air cells. SOFT TISSUES AND SKULL: No acute soft tissue  abnormality. No skull fracture. ALBERTA STROKE PROGRAM EARLY CT SCORE (ASPECTS) ----- Ganglionic (caudate, IC, lentiform nucleus, insula, M1-M3): 7 Supraganglionic (M4-M6): 3 Total: 10 IMPRESSION: 1. No acute intracranial abnormality. ASPECTS of 10. 2. Mild chronic small vessel ischemic disease. 3. These results were communicated to Dr. DOROTHA Cummins at 1:57pm on 09/01/2024 by secure text page via the [facility] messaging system. Electronically signed by: Dasie Hamburg MD 09/01/2024 02:00 PM EST RP Workstation: [id number]    DISCHARGE EXAMINATION: See progress note from earlier today  DISPOSITION: Home with home health  Discharge Instructions     Ambulatory referral to Neurology   Complete by: As directed    An appointment is requested in approximately: 4 weeks for TIA vs complex migraine   Call MD for:  difficulty breathing, headache or visual disturbances   Complete by: As directed    Call MD for:  extreme fatigue   Complete by: As directed    Call MD for:  persistant dizziness or light-headedness   Complete by: As directed    Call MD for:  persistant nausea and vomiting   Complete by: As directed    Call MD for:  severe uncontrolled pain   Complete by: As directed    Call MD for:  temperature >100.4  Complete by: As directed    Discharge instructions   Complete by: As directed    Please take your medications as prescribed. Referral sent to Dr. Collie office. Please note changes to your medication doses: eliquis  and atorvastatin .  You were cared for by a hospitalist during your hospital stay. If you have any questions about your discharge medications or the care you received while you were in the hospital after you are discharged, you can call the unit and asked to speak with the hospitalist on call if the hospitalist that took care of you is not available. Once you are discharged, your primary care physician will handle any further medical issues. Please note that NO REFILLS for any discharge  medications will be authorized once you are discharged, as it is imperative that you return to your primary care physician (or establish a relationship with a primary care physician if you do not have one) for your aftercare needs so that they can reassess your need for medications and monitor your lab values. If you do not have a primary care physician, you can call [phone] for a physician referral.   Increase activity slowly   Complete by: As directed          Allergies as of 09/02/2024       Reactions   Ciprofloxacin  Other (See Comments)   Can not take due to hx of Cdiff per patient   Wasp Venom Other (See Comments)   dizziness   Fluconazole Rash        Medication List     TAKE these medications    carbidopa -levodopa  25-100 MG tablet Commonly known as: SINEMET  IR TAKE 1 AND 1/2 TABLETS BY MOUTH THREE TIMES DAILY The timing of this medication is very important.   carbidopa -levodopa  50-200 MG tablet Commonly known as: SINEMET  CR TAKE 1 TABLET BY MOUTH AT BEDTIME The timing of this medication is very important.   acetaminophen  500 MG tablet Commonly known as: TYLENOL  Take 500-1,000 mg by mouth See admin instructions. Take 1000 mg in the morning, take 1000 mg in the afternoon, and take 500 mg in the evening   atorvastatin  40 MG tablet Commonly known as: LIPITOR Take 1 tablet (40 mg total) by mouth daily after supper. What changed:  medication strength how much to take   CRANBERRY PO Take 1 capsule by mouth daily.   denosumab  60 MG/ML Sosy injection Commonly known as: PROLIA  Inject 60 mg into the skin every 6 (six) months.   Eliquis  5 MG Tabs tablet Generic drug: apixaban  Take 1 tablet (5 mg total) by mouth 2 (two) times daily. What changed:  medication strength how much to take   EPINEPHrine  0.3 mg/0.3 mL Soaj injection Commonly known as: EPI-PEN Inject 0.3 mg into the muscle once as needed for up to 1 dose (anaphylaxis).   furosemide  20 MG  tablet Commonly known as: LASIX  Take 20 mg by mouth every Monday, Wednesday, and Friday. What changed:  when to take this reasons to take this   midodrine  2.5 MG tablet Commonly known as: PROAMATINE  Take 2 tabs in AM, 1 tab at lunch and 1 tab at dinner   omeprazole  40 MG capsule Commonly known as: PRILOSEC  Take 40 mg by mouth continuous as needed (indigestion).   OVER THE COUNTER MEDICATION Take 5 mLs by mouth daily as needed (reflux, heartburn). Reflux Gourmet Caramel: Alginate Complex (Calcium  Pantothenate, Sodium Alginate, Sodium Bicarbonate)   propranolol  ER 80 MG 24 hr capsule Commonly known as: INDERAL   LA Take 80 mg by mouth daily. What changed:  when to take this reasons to take this   saccharomyces boulardii 250 MG capsule Commonly known as: FLORASTOR Take 250 mg by mouth 3 (three) times a week.   VITAMIN B-12 PO Take 1,200 mcg daily by mouth.   Vitamin D2 50 MCG (2000 UT) Tabs Take 2,000 Units daily by mouth.          Follow-up Information     Shayne Anes, MD. Schedule an appointment as soon as possible for a visit in 1 week(s).   Specialty: Internal Medicine Why: post hospitalization follow up Contact information: [address] [place] [address] [phone]                 TOTAL DISCHARGE TIME: 35 minutes  Deylan Canterbury Verdene  [facility] Pager on [email or web address]  09/03/2024, 10:14 AM    "

## 2024-09-02 NOTE — Evaluation (Signed)
 Occupational Therapy Evaluation Patient Details Name: Linda Lee MRN: [id number] DOB: 1936/03/16 Today's Date: 09/02/2024   History of Present Illness   Pt is a 89 y.o female presenting to [facility] 1/28 for R sided facial droop and aphasia. CT negative for acute changes.PMH: HLD, CHF, Parkinson's, TIA, PE, Orthostatic hypotension     Clinical Impressions Pt admitted based on above, and was seen based on problem list below. PTA pt was independent with ADLs and IADLs. Today pt is requiring set up  to CGA for ADLs. Functional transfers are  CGA with use of rollator. Noted pt with deficits in memory and problem solving, however, no family present to confirm baseline. Recommending pt d/c home with increased family support for IADLs, including med management and driving. Pt would benefit from [facility] services to increase safety within the home environment. OT will continue to follow acutely to maximize functional independence.     If plan is discharge home, recommend the following:   A little help with walking and/or transfers;A little help with bathing/dressing/bathroom;Assistance with cooking/housework;Direct supervision/assist for medications management;Direct supervision/assist for financial management;Assist for transportation;Supervision due to cognitive status     Functional Status Assessment   Patient has had a recent decline in their functional status and demonstrates the ability to make significant improvements in function in a reasonable and predictable amount of time.     Equipment Recommendations   None recommended by OT      Precautions/Restrictions   Precautions Precautions: Fall Recall of Precautions/Restrictions: Impaired Restrictions Weight Bearing Restrictions Per Provider Order: No     Mobility Bed Mobility Overal bed mobility: Modified Independent             General bed mobility comments: HOB elevated    Transfers Overall transfer level: Needs  assistance Equipment used: Rollator (4 wheels) Transfers: Sit to/from Stand Sit to Stand: Contact guard assist           General transfer comment: Cues for use of brakes, CGA for balance. Assist for navigating small spaces with rollator      Balance Overall balance assessment: Needs assistance Sitting-balance support: No upper extremity supported, Feet supported Sitting balance-Leahy Scale: Fair     Standing balance support: Bilateral upper extremity supported, During functional activity, Reliant on assistive device for balance Standing balance-Leahy Scale: Poor Standing balance comment: Reliant on RW       ADL either performed or assessed with clinical judgement   ADL Overall ADL's : Needs assistance/impaired Eating/Feeding: Set up;Sitting   Grooming: Wash/dry hands;Contact guard assist;Standing   Upper Body Bathing: Set up;Sitting   Lower Body Bathing: Contact guard assist;Sit to/from stand   Upper Body Dressing : Set up;Sitting   Lower Body Dressing: Contact guard assist;Sit to/from stand   Toilet Transfer: Contact guard assist;Ambulation;Rollator (4 wheels);Regular Toilet;Grab bars   Toileting- Clothing Manipulation and Hygiene: Contact guard assist;Sit to/from stand       Functional mobility during ADLs: Contact guard assist;Rollator (4 wheels) General ADL Comments: CGA for balance     Vision Baseline Vision/History: 0 No visual deficits Patient Visual Report: No change from baseline Vision Assessment?: No apparent visual deficits            Pertinent Vitals/Pain Pain Assessment Pain Assessment: No/denies pain     Extremity/Trunk Assessment Upper Extremity Assessment Upper Extremity Assessment: Generalized weakness   Lower Extremity Assessment Lower Extremity Assessment: Defer to PT evaluation   Cervical / Trunk Assessment Cervical / Trunk Assessment: Normal   Communication Communication Communication: No  apparent difficulties   Cognition  Arousal: Alert Behavior During Therapy: WFL for tasks assessed/performed Cognition: Cognition impaired       Memory impairment (select all impairments): Short-term memory, Working Biochemist, Clinical functioning impairment (select all impairments): Problem solving, Reasoning OT - Cognition Comments: No family present to determine baseline, stm and problem solving deficits noted.       Following commands: Impaired Following commands impaired: Follows multi-step commands inconsistently     Cueing  General Comments   Cueing Techniques: Verbal cues  Slight HR increase during session, but returned to low 100s quickly    Home Living Family/patient expects to be discharged to:: Private residence Living Arrangements: Alone Available Help at Discharge: Family;Available PRN/intermittently (family lives far away but patient in process of moving to assisted living in [place] and to be near daughter and son in law) Type of Home: House Home Access: Stairs to enter Secretary/administrator of Steps: 4 (Garage none) Entrance Stairs-Rails: Can reach both Home Layout: Two level;Able to live on main level with bedroom/bathroom     Bathroom Shower/Tub: Producer, Television/film/video: Handicapped height Bathroom Accessibility: Yes How Accessible: Accessible via walker Home Equipment: Rolling Walker (2 wheels);Wheelchair Financial Trader (4 wheels);Shower seat;Grab bars - tub/shower;Grab bars - toilet;Hand held shower head;Cane - single point;BSC/3in1      Lives With: Alone    Prior Functioning/Environment Prior Level of Function : Independent/Modified Independent             Mobility Comments: Ind, denies falls. Repors a mixture of furniture walking, SPC, and RW ADLs Comments: Ind, driving, manages own meds    OT Problem List: Decreased strength;Decreased activity tolerance;Impaired balance (sitting and/or standing);Decreased cognition;Decreased safety awareness;Decreased  knowledge of use of DME or AE;Decreased knowledge of precautions   OT Treatment/Interventions: Self-care/ADL training;Therapeutic exercise;Energy conservation;DME and/or AE instruction;Therapeutic activities;Patient/family education;Balance training      OT Goals(Current goals can be found in the care plan section)   Acute Rehab OT Goals Patient Stated Goal: To get better OT Goal Formulation: With patient Time For Goal Achievement: 09/16/24 Potential to Achieve Goals: Good   OT Frequency:  Min 2X/week       AM-PAC OT 6 Clicks Daily Activity     Outcome Measure Help from another person eating meals?: None Help from another person taking care of personal grooming?: A Little Help from another person toileting, which includes using toliet, bedpan, or urinal?: A Little Help from another person bathing (including washing, rinsing, drying)?: A Little Help from another person to put on and taking off regular upper body clothing?: A Little Help from another person to put on and taking off regular lower body clothing?: A Little 6 Click Score: 19   End of Session Equipment Utilized During Treatment: Gait belt;Rollator (4 wheels) Nurse Communication: Mobility status  Activity Tolerance: Patient tolerated treatment well Patient left: in chair;with call bell/phone within reach;with chair alarm set  OT Visit Diagnosis: Unsteadiness on feet (R26.81);Other abnormalities of gait and mobility (R26.89);Muscle weakness (generalized) (M62.81)                Time: 1010-1038 OT Time Calculation (min): 28 min Charges:  OT General Charges $OT Visit: 1 Visit OT Evaluation $OT Eval Moderate Complexity: 1 Mod OT Treatments $Self Care/Home Management : 8-22 mins  Adrianne BROCKS, OT  Acute Rehabilitation Services Office [phone] Secure chat preferred   Adrianne GORMAN Savers 09/02/2024, 11:37 AM

## 2024-09-02 NOTE — Care Management Obs Status (Signed)
 MEDICARE OBSERVATION STATUS NOTIFICATION   Patient Details  Name: AAMYA ORELLANA MRN: [id number] Date of Birth: [birth year]   Medicare Observation Status Notification Given:  Yes    Edith Lord Crawford 09/02/2024, 3:45 PM

## 2024-09-02 NOTE — Progress Notes (Addendum)
 STROKE TEAM PROGRESS NOTE   INTERIM HISTORY/SUBJECTIVE Patient presents with transient expressive aphasia and right facial droop which lasted a couple of hours.  She has previously had some episodes of what she calls complicated migraine but those have been similar but have lasted only a few minutes.  She did have a dull headache later on but denies significant headache. Patient remains hemodynamically stable and afebrile overnight.  She states that she had some transient numbness of her left cheek and hand overnight and is still having minor word finding difficulties.  MRI was negative for stroke.  Patient reports that she has been compliant with her Eliquis  and has not missed any doses.  OBJECTIVE  CBC    Component Value Date/Time   WBC 4.6 09/02/2024 0139   RBC 4.06 09/02/2024 0139   HGB 12.6 09/02/2024 0139   HCT 38.4 09/02/2024 0139   PLT 155 09/02/2024 0139   MCV 94.6 09/02/2024 0139   MCH 31.0 09/02/2024 0139   MCHC 32.8 09/02/2024 0139   RDW 13.1 09/02/2024 0139   LYMPHSABS 0.8 09/01/2024 1343   MONOABS 0.3 09/01/2024 1343   EOSABS 0.0 09/01/2024 1343   BASOSABS 0.0 09/01/2024 1343    BMET    Component Value Date/Time   NA 137 09/02/2024 0139   K 3.8 09/02/2024 0139   CL 105 09/02/2024 0139   CO2 23 09/02/2024 0139   GLUCOSE 91 09/02/2024 0139   BUN 16 09/02/2024 0139   CREATININE 0.65 09/02/2024 0139   CALCIUM  8.4 (L) 09/02/2024 0139   GFRNONAA >60 09/02/2024 0139    IMAGING past 24 hours EEG adult Result Date: 09/02/2024 Shelton Arlin KIDD, MD     09/02/2024  9:01 AM Patient Name: Linda Lee MRN: [id number] Epilepsy Attending: Arlin KIDD Shelton Referring Physician/Provider: Jerri Pfeiffer, MD Date: 09/01/2024 Duration: 23.31 mins Patient history: 89yo F with speech difficulty and right facial droop. EEG to evaluate for seizure Level of alertness: Awake AEDs during EEG study: None Technical aspects: This EEG study was done with scalp electrodes positioned according  to the 10-20 International system of electrode placement. Electrical activity was reviewed with band pass filter of 1-70Hz , sensitivity of 7 uV/mm, display speed of 73mm/sec with a 60Hz  notched filter applied as appropriate. EEG data were recorded continuously and digitally stored.  Video monitoring was available and reviewed as appropriate. Description: The posterior dominant rhythm consists of 8-9 Hz activity of moderate voltage (25-35 uV) seen predominantly in posterior head regions, symmetric and reactive to eye opening and eye closing. EEG showed intermittent generalized and lateralized left hemisphere 3 to 6 Hz theta-delta slowing. Hyperventilation and photic stimulation were not performed.   ABNORMALITY - Intermittent slow, generalized and lateralized left hemisphere IMPRESSION: This study is suggestive of non specific cortical dysfunction arising from left hemisphere. Additionally there is generalized cerebral dysfunction (encephalopathy). No seizures or epileptiform discharges were seen throughout the recording. Arlin KIDD Shelton   ECHOCARDIOGRAM COMPLETE Result Date: 09/01/2024    ECHOCARDIOGRAM REPORT   Patient Name:   Linda Lee Date of Exam: 09/01/2024 Medical Rec #:  [id number]          Height:       65.0 in Accession #:    [id number]         Weight:       159.2 lb Date of Birth:  07/06/1936         BSA:          1.795 m Patient Age:  89 years           BP:           156/62 mmHg Patient Gender: F                  HR:           70 bpm. Exam Location:  Inpatient Procedure: 2D Echo (Both Spectral and Color Flow Doppler were utilized during            procedure). Indications:    stroke  History:        Patient has prior history of Echocardiogram examinations, most                 recent 11/13/2023. CHF, Parkinson's; Risk Factors:Dyslipidemia.  Sonographer:    Tinnie Barefoot RDCS Referring Phys: [id number] JINDONG XU IMPRESSIONS  1. Left ventricular ejection fraction, by estimation, is 65 to 70%.  The left ventricle has normal function. The left ventricle has no regional wall motion abnormalities. Left ventricular diastolic parameters are consistent with Grade I diastolic dysfunction (impaired relaxation).  2. Right ventricular systolic function is normal. The right ventricular size is normal.  3. The mitral valve is normal in structure. Trivial mitral valve regurgitation. No evidence of mitral stenosis.  4. The aortic valve is tricuspid. Aortic valve regurgitation is mild to moderate. No aortic stenosis is present. Aortic regurgitation PHT measures 504 msec.  5. Aortic dilatation noted. There is mild dilatation of the ascending aorta, measuring 41 mm.  6. The inferior vena cava is normal in size with greater than 50% respiratory variability, suggesting right atrial pressure of 3 mmHg. FINDINGS  Left Ventricle: Left ventricular ejection fraction, by estimation, is 65 to 70%. The left ventricle has normal function. The left ventricle has no regional wall motion abnormalities. The left ventricular internal cavity size was normal in size. There is  no left ventricular hypertrophy. Left ventricular diastolic parameters are consistent with Grade I diastolic dysfunction (impaired relaxation). Right Ventricle: The right ventricular size is normal. No increase in right ventricular wall thickness. Right ventricular systolic function is normal. Left Atrium: Left atrial size was normal in size. Right Atrium: Right atrial size was normal in size. Pericardium: There is no evidence of pericardial effusion. Mitral Valve: The mitral valve is normal in structure. Trivial mitral valve regurgitation. No evidence of mitral valve stenosis. Tricuspid Valve: The tricuspid valve is normal in structure. Tricuspid valve regurgitation is trivial. No evidence of tricuspid stenosis. Aortic Valve: The aortic valve is tricuspid. Aortic valve regurgitation is mild to moderate. Aortic regurgitation PHT measures 504 msec. No aortic stenosis  is present. Pulmonic Valve: The pulmonic valve was normal in structure. Pulmonic valve regurgitation is trivial. No evidence of pulmonic stenosis. Aorta: The aortic root is normal in size and structure and aortic dilatation noted. There is mild dilatation of the ascending aorta, measuring 41 mm. Venous: The inferior vena cava is normal in size with greater than 50% respiratory variability, suggesting right atrial pressure of 3 mmHg. IAS/Shunts: No atrial level shunt detected by color flow Doppler.  LEFT VENTRICLE PLAX 2D LVIDd:         4.60 cm   Diastology LVIDs:         3.00 cm   LV e' medial:    7.30 cm/s LV PW:         1.00 cm   LV E/e' medial:  8.6 LV IVS:        1.10 cm   LV  e' lateral:   6.84 cm/s LVOT diam:     1.80 cm   LV E/e' lateral: 9.2 LV SV:         61 LV SV Index:   34 LVOT Area:     2.54 cm LV IVRT:       144 msec  RIGHT VENTRICLE             IVC RV Basal diam:  2.50 cm     IVC diam: 1.20 cm RV S prime:     18.50 cm/s                             PULMONARY VEINS                             Diastolic Velocity: 36.30 cm/s                             S/D Velocity:       1.40                             Systolic Velocity:  51.40 cm/s LEFT ATRIUM             Index        RIGHT ATRIUM           Index LA diam:        3.30 cm 1.84 cm/m   RA Area:     12.30 cm LA Vol (A2C):   39.4 ml 21.95 ml/m  RA Volume:   24.20 ml  13.48 ml/m LA Vol (A4C):   41.5 ml 23.12 ml/m LA Biplane Vol: 40.8 ml 22.73 ml/m  AORTIC VALVE LVOT Vmax:   120.00 cm/s LVOT Vmean:  80.100 cm/s LVOT VTI:    0.239 m AI PHT:      504 msec  AORTA Ao Root diam: 3.50 cm Ao Asc diam:  4.10 cm MITRAL VALVE MV Area (PHT): 4.21 cm    SHUNTS MV Decel Time: 180 msec    Systemic VTI:  0.24 m MV E velocity: 62.80 cm/s  Systemic Diam: 1.80 cm MV A velocity: 93.00 cm/s MV E/A ratio:  0.68 Toribio Fuel MD Electronically signed by Toribio Fuel MD Signature Date/Time: 09/01/2024/9:19:03 PM    Final    MR BRAIN WO CONTRAST Result Date:  09/01/2024 EXAM: MRI BRAIN WITHOUT CONTRAST 09/01/2024 06:42:57 PM TECHNIQUE: Multiplanar multisequence MRI of the head/brain was performed without the administration of intravenous contrast. COMPARISON: CT head without contrast, CT angio head and neck, and CT perfusion dated 09/01/2024. MR head dated 08/11/2018. CLINICAL HISTORY: Neuro deficit, acute, stroke suspected. Speech difficulty and right facial droop. FINDINGS: BRAIN AND VENTRICLES: No acute infarct. No intracranial hemorrhage. No mass. No midline shift. No hydrocephalus. Moderate periventricular and subcortical white matter changes are present bilaterally. The changes extend into the brainstem. The sella is unremarkable. Normal flow voids. ORBITS: Bilateral lens replacements are noted. The globes and orbits are otherwise within normal limits. SINUSES AND MASTOIDS: Moderate mucosal thickening is present in the left maxillary sinus. Minimal fluid is present in the inferior mastoid air cells bilaterally. BONES AND SOFT TISSUES: Normal marrow signal. No soft tissue abnormality. Degenerative changes of the cervical spine are noted at C4-C5 and C5-C6. IMPRESSION: 1. No acute intracranial abnormality. 2. Chronic bilateral  periventricular and subcortical white matter changes extending into the brainstem, most consistent with microvascular ischemic change. Electronically signed by: Lonni Necessary MD 09/01/2024 06:59 PM EST RP Workstation: [id number]   CT ANGIO HEAD NECK W WO CM W PERF (CODE STROKE) Result Date: 09/01/2024 EXAM: CTA Head and Neck with Perfusion 09/01/2024 02:03:00 PM TECHNIQUE: CTA of the head and neck was performed with and without the administration of 100 mL of iohexol  (OMNIPAQUE ) 350 MG/ML injection. 3D postprocessing with multiplanar reconstructions and MIPs was performed to evaluate the vascular anatomy. Cerebral perfusion analysis using computed tomography with contrast administration, including post-processing of parametric maps  with determination of cerebral blood flow, cerebral blood volume, mean transit time and time-to-maximum. Automated exposure control, iterative reconstruction, and/or weight based adjustment of the mA/kV was utilized to reduce the radiation dose to as low as reasonably achievable. COMPARISON: CT head without contrast 09/01/2024 and CT angio head and neck 03/06/2023. CLINICAL HISTORY: Neuro deficit, acute, stroke suspected. FINDINGS: CTA NECK: AORTIC ARCH AND ARCH VESSELS: Atherosclerotic calcifications are present in the distal aortic arch and proximal left subclavian artery without significant stenosis or change. No dissection or arterial injury. No significant stenosis of the brachiocephalic artery. CERVICAL CAROTID ARTERIES: Tortuosity is present in the proximal right internal carotid artery with a 50% stenosis. The left internal carotid artery is patent without significant stenosis or dissection. No arterial injury. CERVICAL VERTEBRAL ARTERIES: The vertebral arteries are codominant. No dissection, arterial injury, or significant stenosis. LUNGS AND MEDIASTINUM: Unremarkable. SOFT TISSUES: No acute abnormality. BONES: No acute abnormality. CTA HEAD: ANTERIOR CIRCULATION: No significant stenosis of the intracranial internal carotid arteries. No significant stenosis of the anterior cerebral arteries. Moderate stenosis is present in the superior right M2 segment without significant attenuation of distal vessels. The left middle cerebral artery is patent proximally without significant stenosis. No aneurysm. POSTERIOR CIRCULATION: No significant stenosis of the posterior cerebral arteries. No significant stenosis of the basilar artery. No significant stenosis of the intracranial vertebral arteries. No aneurysm. OTHER: No dural venous sinus thrombosis on this non-dedicated study. CT PERFUSION: EXAM QUALITY: Exam quality is adequate with diagnostic perfusion maps. No significant motion artifact. Appropriate arterial  inflow and venous outflow curves. CORE INFARCT (CBF<30% volume): 0 mL TOTAL HYPOPERFUSION (Tmax>6s volume): 0 mL PENUMBRA: Mismatch volume: 0 mL Mismatch ratio: not applicable Location: not applicable IMPRESSION: 1. No acute large vessel occlusion. 2. No evidence of ischemia by CT brain perfusion (CBF 0 mL, Tmax 0 mL, mismatch 0 mL). 3. Tortuosity in the proximal right internal carotid artery with 50% stenosis. 4. Moderate stenosis in the superior right M2 segment without significant attenuation of distal vessels. Electronically signed by: Lonni Necessary MD 09/01/2024 02:25 PM EST RP Workstation: [id number]   CT HEAD CODE STROKE WO CONTRAST Result Date: 09/01/2024 EXAM: CT HEAD WITHOUT CONTRAST 09/01/2024 01:49:00 PM TECHNIQUE: CT of the head was performed without the administration of intravenous contrast. Automated exposure control, iterative reconstruction, and/or weight based adjustment of the mA/kV was utilized to reduce the radiation dose to as low as reasonably achievable. COMPARISON: CTA head and neck 03/06/23 CLINICAL HISTORY: Neuro deficit, acute, STROKE suspected. FINDINGS: BRAIN AND VENTRICLES: Streak artifact from hearing aids obscures portions of the left greater than right temporal lobes and posterior fossa. Within this limitation, no acute large territory infarct, intracranial hemorrhage, mass, midline shift, hydrocephalus, or extra-axial fluid collection is identified. Mild cerebral atrophy is within normal limits for age. Hypodensities in the cerebral white matter bilaterally are similar to the prior study and  nonspecific but compatible with mild chronic small vessel ischemic disease. Calcified atherosclerosis at the skull base. ORBITS: Bilateral cataract extraction. SINUSES: Partially visualized chronic left maxillary sinusitis. Clear mastoid air cells. SOFT TISSUES AND SKULL: No acute soft tissue abnormality. No skull fracture. ALBERTA STROKE PROGRAM EARLY CT SCORE (ASPECTS) -----  Ganglionic (caudate, IC, lentiform nucleus, insula, M1-M3): 7 Supraganglionic (M4-M6): 3 Total: 10 IMPRESSION: 1. No acute intracranial abnormality. ASPECTS of 10. 2. Mild chronic small vessel ischemic disease. 3. These results were communicated to Dr. DOROTHA Cummins at 1:57pm on 09/01/2024 by secure text page via the [facility] messaging system. Electronically signed by: Dasie Hamburg MD 09/01/2024 02:00 PM EST RP Workstation: [id number]    Vitals:   09/01/24 2343 09/02/24 0403 09/02/24 0840 09/02/24 1208  BP: (!) 155/78 (!) 154/62 (!) 156/91 115/65  Pulse: 66 64 78 72  Resp: 16 16 18 16   Temp: 97.7 F (36.5 C) 97.6 F (36.4 C) (!) 97.4 F (36.3 C) 98 F (36.7 C)  TempSrc: Oral Oral Oral   SpO2: 97% 98% 100% 99%  Weight:      Height:         PHYSICAL EXAM General:  Alert, well-nourished, well-developed patient in no acute distress Psych:  Mood and affect appropriate for situation Respiratory:  Regular, unlabored respirations on room air  NEURO:  Mental Status: AA&Ox3, patient is able to give clear and coherent history Speech/Language: speech is without dysarthria or aphasia.  Fluency, and comprehension intact, but patient reports minor word finding difficulties.  Cranial Nerves:  II: PERRL. Visual fields full.  III, IV, VI: EOMI. Eyelids elevate symmetrically.  VII: Face is symmetrical resting and smiling VIII: hearing intact to voice. IX, X: Phonation is normal.  KP:Dynloizm shrug 5/5. XII: tongue is midline without fasciculations. Motor: Able to move all 4 extremities with antigravity strength Tone: is normal and bulk is normal Sensation- Intact to light touch bilaterally.  Coordination: FTN intact bilaterally Gait- deferred  Most Recent NIH 0  ASSESSMENT/PLAN  Ms. MADALINA ROSMAN is a 89 y.o. female with history of Parkinson's disease, pulmonary embolus on Eliquis , severe spinal stenosis with lower back pain, orthostatic hypotension, atrial tachycardia and complicated migraines  admitted for difficulties with word finding and writing and right facial droop as well as headache.  Patient states that she has had complicated migraines with aphasia in the past but that they have been much shorter than this and episode which brought her to the hospital.  MRI was negative for acute infarct.  She reports compliance with her Eliquis  and has not missed any doses.  NIH on Admission 2  TIA: Left-hemispheric TIA with strong suspicion for paroxysmal A-fib which has not been documented yet versus complicated migraine less likely Code Stroke CT head No acute abnormality. Small vessel disease. ASPECTS 10.    CTA head & neck no LVO, tortuosity and proximal right ICA with 50% stenosis, moderate stenosis in superior right M2 segment MRI no acute abnormality 2D Echo EF 65 to 70%, grade 1 diastolic dysfunction, normal left atrial size, no atrial level shunt LDL 135 HgbA1c 5.4 VTE prophylaxis -fully anticoagulated with Eliquis  Eliquis  2.5 mg twice (apixaban ) daily prior to admission, now on Eliquis  (apixaban ) 5 mg twice daily Therapy recommendations:  Home Health PT and Home Health OT Disposition: Pending, likely home  Atrial fibrillation Patient has history of atrial tachycardia with questionable history of A-fib Home Meds: Eliquis  2.5 mg twice daily, increased to 5 mg twice daily Continue telemetry monitoring  Orthostatic  hypotension Home meds: Midodrine  5 mg in the morning, 2.5 mg with lunch and dinner Stable Maintain normotension  Hyperlipidemia Home meds: Lovastatin 20 mg daily, increased to 40 LDL 135, goal < 70 Continue statin at discharge  Other Stroke Risk Factors Advanced age   Other Active Problems None  Hospital day # 0  Patient seen by NP with MD, MD to edit note as needed. Cortney E Everitt Clint Kill , MSN, AGACNP-BC [facility] See Amion for schedule and pager information 09/02/2024 1:20 PM   I have personally obtained history,examined this patient,  reviewed notes, independently viewed imaging studies, participated in medical decision making and plan of care.ROS completed by me personally and pertinent positives fully documented  I have made any additions or clarifications directly to the above note. Agree with note above.  Patient presented with 1 to 2-hour episode of expressive aphasia right facial droop likely left hemispheric TIA.  Given her age transposition for paroxysmal A-fib.  She is already on Eliquis  2.5 twice daily for pulmonary embolism would recommend switching dose to 5 mg twice daily with pharmacy approval.  Mobilize out of bed.  Therapy consults.  Discussed with Dr. Joette   I personally spent a total of 50 minutes in the care of the patient today including getting/reviewing separately obtained history, performing a medically appropriate exam/evaluation, counseling and educating, placing orders, referring and communicating with other health care professionals, documenting clinical information in the EHR, independently interpreting results, and coordinating care.        Eather Popp, MD Medical Director [facility] Pager: [phone] 09/02/2024 3:25 PM  To contact Stroke Continuity provider, please refer to [email or web address]. After hours, contact General Neurology

## 2024-09-02 NOTE — Progress Notes (Signed)
 Communicated with the patient via Caregility. Patient appears comfortable sitting in the bed. AAOx4. AVS paperwork given to the patient. Discharge instructions discussed. Patients question answered to satisfaction. Patient agreeable with discharge plan.

## 2024-09-02 NOTE — Plan of Care (Signed)

## 2024-09-06 ENCOUNTER — Emergency Department (HOSPITAL_COMMUNITY)
Admission: EM | Admit: 2024-09-06 | Discharge: 2024-09-06 | Disposition: A | Discharge status: Home / Self Care | Attending: Emergency Medicine | Admitting: Emergency Medicine

## 2024-09-06 ENCOUNTER — Encounter (HOSPITAL_COMMUNITY): Payer: Self-pay | Admitting: Emergency Medicine

## 2024-09-06 ENCOUNTER — Emergency Department (HOSPITAL_COMMUNITY)

## 2024-09-06 ENCOUNTER — Other Ambulatory Visit: Payer: Self-pay

## 2024-09-06 DIAGNOSIS — I4891 Unspecified atrial fibrillation: Secondary | ICD-10-CM | POA: Insufficient documentation

## 2024-09-06 DIAGNOSIS — R471 Dysarthria and anarthria: Secondary | ICD-10-CM | POA: Insufficient documentation

## 2024-09-06 DIAGNOSIS — Z8673 Personal history of transient ischemic attack (TIA), and cerebral infarction without residual deficits: Secondary | ICD-10-CM | POA: Insufficient documentation

## 2024-09-06 DIAGNOSIS — G459 Transient cerebral ischemic attack, unspecified: Secondary | ICD-10-CM

## 2024-09-06 DIAGNOSIS — Z7901 Long term (current) use of anticoagulants: Secondary | ICD-10-CM | POA: Insufficient documentation

## 2024-09-06 DIAGNOSIS — G20C Parkinsonism, unspecified: Secondary | ICD-10-CM | POA: Insufficient documentation

## 2024-09-06 LAB — COMPREHENSIVE METABOLIC PANEL WITH GFR
ALT: <5 U/L (ref 0–44)
AST: 16 U/L (ref 15–41)
Albumin: 4 g/dL (ref 3.5–5.0)
Alkaline Phosphatase: 64 U/L (ref 38–126)
Anion gap: 11 (ref 5–15)
BUN: 18 mg/dL (ref 8–23)
CO2: 22 mmol/L (ref 22–32)
Calcium: 8.7 mg/dL — ABNORMAL LOW (ref 8.9–10.3)
Chloride: 108 mmol/L (ref 98–111)
Creatinine, Ser: 0.76 mg/dL (ref 0.44–1.00)
GFR, Estimated: >60 mL/min
Glucose, Bld: 95 mg/dL (ref 70–99)
Potassium: 4.5 mmol/L (ref 3.5–5.1)
Sodium: 141 mmol/L (ref 135–145)
Total Bilirubin: 0.4 mg/dL (ref 0.0–1.2)
Total Protein: 6.5 g/dL (ref 6.5–8.1)

## 2024-09-06 LAB — CBC
HCT: 41 % (ref 36.0–46.0)
Hemoglobin: 13.1 g/dL (ref 12.0–15.0)
MCH: 31.3 pg (ref 26.0–34.0)
MCHC: 32 g/dL (ref 30.0–36.0)
MCV: 97.9 fL (ref 80.0–100.0)
Platelets: 161 10*3/uL (ref 150–400)
RBC: 4.19 MIL/uL (ref 3.87–5.11)
RDW: 13.1 % (ref 11.5–15.5)
WBC: 4.5 10*3/uL (ref 4.0–10.5)
nRBC: 0 % (ref 0.0–0.2)

## 2024-09-06 LAB — APTT: aPTT: 34 s (ref 24–36)

## 2024-09-06 LAB — PROTIME-INR
INR: 1.2 (ref 0.8–1.2)
Prothrombin Time: 15.9 s — ABNORMAL HIGH (ref 11.4–15.2)

## 2024-09-06 NOTE — ED Triage Notes (Addendum)
 Pt reports slurred speech, drooling, and right hand numbness that started at 1210 today. Pt reports the symptoms lasted approx 10 minutes. Pt admitted 3 days ago for TIA.

## 2024-09-06 NOTE — ED Notes (Signed)
 Pt ambulated to restroom and back with minimal assistance and no complaints at this time. Pt normally walks with a cane.

## 2024-09-06 NOTE — ED Notes (Signed)
 Pt in CT when called in lobby for room. Family member moved to room to await pt

## 2024-09-06 NOTE — Discharge Instructions (Signed)
 You are seen in the ER tonight for a transient ischemic attack or TIA.  Since the symptoms were very similar to your other symptoms, we do not need to do any further workup for these conditions.  You do have a small area of narrowing in one of the large blood vessels of your brain and it is possible that changes to your blood pressure or temperature may cause decreased blood flow to the brain for a little bit.  I spoke with neurology who do not think that we need to do any further tests.  If you have another episode similar to this episode, you do not need to go to the ER.  If you have new symptoms, such as weakness of your arm or leg, loss of visual field, or symptoms that do not go away in 10 to 30 minutes, you should come back to the ED for further evaluation.  Please keep all of your previously scheduled appointments with your occupational therapist, physical therapist, neurologist, and primary care doctor

## 2024-09-07 ENCOUNTER — Telehealth: Payer: Self-pay

## 2024-09-07 NOTE — Telephone Encounter (Signed)
 Received Access nurse message about this patient. She was at OT and her words were not making sense, she was instructed she may have a TIA and she was sent to ED. I saw she did go to ED and a CT and EKG and it appears everything was ok. It is recommended she F/U with PCP right away and F/U with Dr. Evonnie when scheduled

## 2024-09-08 ENCOUNTER — Telehealth: Payer: Self-pay | Admitting: Neurology

## 2024-09-08 ENCOUNTER — Other Ambulatory Visit: Payer: Self-pay

## 2024-09-08 DIAGNOSIS — R4701 Aphasia: Secondary | ICD-10-CM

## 2024-09-08 DIAGNOSIS — G903 Multi-system degeneration of the autonomic nervous system: Secondary | ICD-10-CM

## 2024-09-08 DIAGNOSIS — R9401 Abnormal electroencephalogram [EEG]: Secondary | ICD-10-CM

## 2024-09-08 DIAGNOSIS — G20A1 Parkinson's disease without dyskinesia, without mention of fluctuations: Secondary | ICD-10-CM

## 2024-10-07 ENCOUNTER — Telehealth: Admitting: Neurology

## 2024-10-29 ENCOUNTER — Encounter (HOSPITAL_COMMUNITY)

## 2025-01-10 ENCOUNTER — Ambulatory Visit: Admitting: Internal Medicine
